# Patient Record
Sex: Female | Born: 1950
Health system: Southern US, Community
[De-identification: ages and names within clinical notes are randomized; demographics above are authoritative.]

## PROBLEM LIST (undated history)

## (undated) DIAGNOSIS — E559 Vitamin D deficiency, unspecified: Secondary | ICD-10-CM

## (undated) DIAGNOSIS — F329 Major depressive disorder, single episode, unspecified: Secondary | ICD-10-CM

## (undated) DIAGNOSIS — I10 Essential (primary) hypertension: Secondary | ICD-10-CM

## (undated) DIAGNOSIS — K5790 Diverticulosis of intestine, part unspecified, without perforation or abscess without bleeding: Secondary | ICD-10-CM

## (undated) DIAGNOSIS — Z9989 Dependence on other enabling machines and devices: Secondary | ICD-10-CM

## (undated) DIAGNOSIS — F32A Depression, unspecified: Secondary | ICD-10-CM

## (undated) DIAGNOSIS — E785 Hyperlipidemia, unspecified: Secondary | ICD-10-CM

## (undated) DIAGNOSIS — G5601 Carpal tunnel syndrome, right upper limb: Secondary | ICD-10-CM

## (undated) DIAGNOSIS — F419 Anxiety disorder, unspecified: Secondary | ICD-10-CM

## (undated) DIAGNOSIS — E119 Type 2 diabetes mellitus without complications: Secondary | ICD-10-CM

## (undated) DIAGNOSIS — G4733 Obstructive sleep apnea (adult) (pediatric): Secondary | ICD-10-CM

## (undated) DIAGNOSIS — I519 Heart disease, unspecified: Secondary | ICD-10-CM

## (undated) DIAGNOSIS — I2510 Atherosclerotic heart disease of native coronary artery without angina pectoris: Secondary | ICD-10-CM

## (undated) DIAGNOSIS — R32 Unspecified urinary incontinence: Secondary | ICD-10-CM

## (undated) DIAGNOSIS — K219 Gastro-esophageal reflux disease without esophagitis: Secondary | ICD-10-CM

## (undated) DIAGNOSIS — Z8542 Personal history of malignant neoplasm of other parts of uterus: Secondary | ICD-10-CM

## (undated) DIAGNOSIS — M1990 Unspecified osteoarthritis, unspecified site: Secondary | ICD-10-CM

## (undated) HISTORY — DX: Gastro-esophageal reflux disease without esophagitis: K21.9

## (undated) HISTORY — DX: Essential (primary) hypertension: I10

## (undated) HISTORY — PX: TOTAL ABDOMINAL HYSTERECTOMY W/ BILATERAL SALPINGOOPHORECTOMY: SHX83

## (undated) HISTORY — PX: KNEE ARTHROSCOPY: SUR90

## (undated) HISTORY — PX: LAPAROSCOPIC CHOLECYSTECTOMY: SUR755

## (undated) HISTORY — DX: Heart disease, unspecified: I51.9

## (undated) HISTORY — PX: CARDIAC CATHETERIZATION: SHX172

## (undated) HISTORY — DX: Anxiety disorder, unspecified: F41.9

## (undated) HISTORY — DX: Hyperlipidemia, unspecified: E78.5

## (undated) HISTORY — PX: TUBAL LIGATION: SHX77

## (undated) HISTORY — PX: CHOLECYSTECTOMY: SHX55

---

## 2002-10-28 ENCOUNTER — Observation Stay (HOSPITAL_COMMUNITY): Admission: RE | Admit: 2002-10-28 | Discharge: 2002-10-30 | Payer: Self-pay | Admitting: Gastroenterology

## 2002-10-28 ENCOUNTER — Encounter (INDEPENDENT_AMBULATORY_CARE_PROVIDER_SITE_OTHER): Payer: Self-pay | Admitting: Specialist

## 2002-12-30 ENCOUNTER — Ambulatory Visit (HOSPITAL_COMMUNITY): Admission: RE | Admit: 2002-12-30 | Discharge: 2002-12-30 | Payer: Self-pay | Admitting: Gastroenterology

## 2002-12-30 ENCOUNTER — Encounter (INDEPENDENT_AMBULATORY_CARE_PROVIDER_SITE_OTHER): Payer: Self-pay | Admitting: *Deleted

## 2003-02-25 DIAGNOSIS — I10 Essential (primary) hypertension: Secondary | ICD-10-CM

## 2003-02-25 DIAGNOSIS — E119 Type 2 diabetes mellitus without complications: Secondary | ICD-10-CM

## 2003-02-25 HISTORY — DX: Essential (primary) hypertension: I10

## 2003-02-25 HISTORY — DX: Type 2 diabetes mellitus without complications: E11.9

## 2003-05-26 DIAGNOSIS — Z8542 Personal history of malignant neoplasm of other parts of uterus: Secondary | ICD-10-CM

## 2003-05-26 HISTORY — DX: Personal history of malignant neoplasm of other parts of uterus: Z85.42

## 2003-06-20 ENCOUNTER — Encounter (INDEPENDENT_AMBULATORY_CARE_PROVIDER_SITE_OTHER): Payer: Self-pay | Admitting: Specialist

## 2003-06-20 ENCOUNTER — Inpatient Hospital Stay (HOSPITAL_COMMUNITY): Admission: RE | Admit: 2003-06-20 | Discharge: 2003-06-22 | Payer: Self-pay | Admitting: Obstetrics and Gynecology

## 2003-12-07 ENCOUNTER — Other Ambulatory Visit: Admission: RE | Admit: 2003-12-07 | Discharge: 2003-12-07 | Payer: Self-pay | Admitting: *Deleted

## 2004-04-08 ENCOUNTER — Other Ambulatory Visit: Admission: RE | Admit: 2004-04-08 | Discharge: 2004-04-08 | Payer: Self-pay | Admitting: *Deleted

## 2004-08-08 ENCOUNTER — Other Ambulatory Visit: Admission: RE | Admit: 2004-08-08 | Discharge: 2004-08-08 | Payer: Self-pay | Admitting: *Deleted

## 2005-01-31 ENCOUNTER — Other Ambulatory Visit: Admission: RE | Admit: 2005-01-31 | Discharge: 2005-01-31 | Payer: Self-pay | Admitting: Obstetrics and Gynecology

## 2005-02-06 ENCOUNTER — Encounter: Admission: RE | Admit: 2005-02-06 | Discharge: 2005-02-06 | Payer: Self-pay | Admitting: Gastroenterology

## 2005-02-12 ENCOUNTER — Ambulatory Visit (HOSPITAL_COMMUNITY): Admission: RE | Admit: 2005-02-12 | Discharge: 2005-02-12 | Payer: Self-pay | Admitting: Gastroenterology

## 2005-02-12 ENCOUNTER — Encounter (INDEPENDENT_AMBULATORY_CARE_PROVIDER_SITE_OTHER): Payer: Self-pay | Admitting: *Deleted

## 2005-08-11 ENCOUNTER — Other Ambulatory Visit: Admission: RE | Admit: 2005-08-11 | Discharge: 2005-08-11 | Payer: Self-pay | Admitting: Obstetrics & Gynecology

## 2006-02-10 ENCOUNTER — Other Ambulatory Visit: Admission: RE | Admit: 2006-02-10 | Discharge: 2006-02-10 | Payer: Self-pay | Admitting: Obstetrics and Gynecology

## 2006-08-17 ENCOUNTER — Other Ambulatory Visit: Admission: RE | Admit: 2006-08-17 | Discharge: 2006-08-17 | Payer: Self-pay | Admitting: Obstetrics and Gynecology

## 2006-08-21 ENCOUNTER — Ambulatory Visit (HOSPITAL_COMMUNITY): Admission: RE | Admit: 2006-08-21 | Discharge: 2006-08-21 | Payer: Self-pay | Admitting: Gastroenterology

## 2006-08-24 ENCOUNTER — Ambulatory Visit (HOSPITAL_COMMUNITY): Admission: RE | Admit: 2006-08-24 | Discharge: 2006-08-24 | Payer: Self-pay | Admitting: Gastroenterology

## 2006-11-19 ENCOUNTER — Encounter (INDEPENDENT_AMBULATORY_CARE_PROVIDER_SITE_OTHER): Payer: Self-pay | Admitting: General Surgery

## 2006-11-19 ENCOUNTER — Ambulatory Visit (HOSPITAL_COMMUNITY): Admission: RE | Admit: 2006-11-19 | Discharge: 2006-11-19 | Payer: Self-pay | Admitting: General Surgery

## 2007-02-12 ENCOUNTER — Other Ambulatory Visit: Admission: RE | Admit: 2007-02-12 | Discharge: 2007-02-12 | Payer: Self-pay | Admitting: Obstetrics & Gynecology

## 2007-08-19 ENCOUNTER — Other Ambulatory Visit: Admission: RE | Admit: 2007-08-19 | Discharge: 2007-08-19 | Payer: Self-pay | Admitting: Obstetrics and Gynecology

## 2008-02-22 ENCOUNTER — Other Ambulatory Visit: Admission: RE | Admit: 2008-02-22 | Discharge: 2008-02-22 | Payer: Self-pay | Admitting: Obstetrics and Gynecology

## 2008-05-17 ENCOUNTER — Ambulatory Visit (HOSPITAL_BASED_OUTPATIENT_CLINIC_OR_DEPARTMENT_OTHER): Admission: RE | Admit: 2008-05-17 | Discharge: 2008-05-17 | Payer: Self-pay | Admitting: Orthopedic Surgery

## 2008-07-04 ENCOUNTER — Emergency Department (HOSPITAL_COMMUNITY): Admission: EM | Admit: 2008-07-04 | Discharge: 2008-07-04 | Payer: Self-pay | Admitting: Family Medicine

## 2009-09-05 ENCOUNTER — Emergency Department (HOSPITAL_BASED_OUTPATIENT_CLINIC_OR_DEPARTMENT_OTHER): Admission: EM | Admit: 2009-09-05 | Discharge: 2009-09-06 | Payer: Self-pay | Admitting: Emergency Medicine

## 2009-09-05 ENCOUNTER — Ambulatory Visit: Payer: Self-pay | Admitting: Diagnostic Radiology

## 2010-05-12 LAB — DIFFERENTIAL
Basophils Absolute: 0.4 10*3/uL — ABNORMAL HIGH (ref 0.0–0.1)
Basophils Relative: 4 % — ABNORMAL HIGH (ref 0–1)
Eosinophils Absolute: 0.1 10*3/uL (ref 0.0–0.7)
Eosinophils Relative: 2 % (ref 0–5)
Lymphocytes Relative: 20 % (ref 12–46)
Lymphs Abs: 1.9 10*3/uL (ref 0.7–4.0)
Monocytes Absolute: 0.8 10*3/uL (ref 0.1–1.0)
Monocytes Relative: 8 % (ref 3–12)
Neutro Abs: 6.3 10*3/uL (ref 1.7–7.7)
Neutrophils Relative %: 66 % (ref 43–77)

## 2010-05-12 LAB — BASIC METABOLIC PANEL
BUN: 17 mg/dL (ref 6–23)
CO2: 27 mEq/L (ref 19–32)
Calcium: 9.1 mg/dL (ref 8.4–10.5)
Chloride: 105 mEq/L (ref 96–112)
Creatinine, Ser: 0.7 mg/dL (ref 0.4–1.2)
GFR calc Af Amer: >60 mL/min (ref >60)
GFR calc non Af Amer: >60 mL/min (ref >60)
Glucose, Bld: 122 mg/dL — ABNORMAL HIGH (ref 70–99)
Potassium: 4 mEq/L (ref 3.5–5.1)
Sodium: 142 mEq/L (ref 135–145)

## 2010-05-12 LAB — CBC
HCT: 38.6 % (ref 36.0–46.0)
Hemoglobin: 12.9 g/dL (ref 12.0–15.0)
MCH: 30.1 pg (ref 26.0–34.0)
MCHC: 33.4 g/dL (ref 30.0–36.0)
MCV: 89.9 fL (ref 78.0–100.0)
Platelets: 217 10*3/uL (ref 150–400)
RBC: 4.29 MIL/uL (ref 3.87–5.11)
RDW: 12.3 % (ref 11.5–15.5)
WBC: 9.5 10*3/uL (ref 4.0–10.5)

## 2010-06-04 LAB — POCT RAPID STREP A (OFFICE): Streptococcus, Group A Screen (Direct): NEGATIVE

## 2010-06-06 LAB — POCT HEMOGLOBIN-HEMACUE: Hemoglobin: 13 g/dL (ref 12.0–15.0)

## 2010-06-06 LAB — BASIC METABOLIC PANEL
BUN: 16 mg/dL (ref 6–23)
CO2: 30 mEq/L (ref 19–32)
Calcium: 9.6 mg/dL (ref 8.4–10.5)
Chloride: 100 mEq/L (ref 96–112)
Creatinine, Ser: 0.72 mg/dL (ref 0.4–1.2)
GFR calc Af Amer: >60 mL/min (ref >60)
GFR calc non Af Amer: >60 mL/min (ref >60)
Glucose, Bld: 158 mg/dL — ABNORMAL HIGH (ref 70–99)
Potassium: 4 mEq/L (ref 3.5–5.1)
Sodium: 141 mEq/L (ref 135–145)

## 2010-06-06 LAB — GLUCOSE, CAPILLARY
Glucose-Capillary: 114 mg/dL — ABNORMAL HIGH (ref 70–99)
Glucose-Capillary: 116 mg/dL — ABNORMAL HIGH (ref 70–99)

## 2010-07-09 NOTE — Op Note (Signed)
NAMEEMSLEE, [name] [name]              ACCOUNT NO.:  [id number]   MEDICAL RECORD NO.:  [id number]          PATIENT TYPE:  AMB   LOCATION:  DAY                          FACILITY:  [facility]   PHYSICIAN:  Adolph Pollack, M.D.DATE OF BIRTH:  05-08-1950   DATE OF PROCEDURE:  11/19/2006  DATE OF DISCHARGE:                               OPERATIVE REPORT   PREOPERATIVE DIAGNOSIS:  Biliary dyskinesia.   POSTOPERATIVE DIAGNOSIS:  Biliary dyskinesia.   PROCEDURE:  Laparoscopic cholecystectomy with intraoperative  cholangiogram.   SURGEON:  Adolph Pollack, M.D.   ASSISTANT:  Anselm Pancoast. Zachery Dakins, M.D.   ANESTHESIA:  General.   INDICATIONS:  [name] [name] is a 60 year old female who has type 2 diabetes  mellitus.  She has been having problems with upper abdominal pain in the  right upper quadrant radiating around to her subscapular area.  She has  known mild gastroparesis.  Abdominal ultrasound demonstrated no  gallstones, but she has biliary dyskinesia based on a nuclear medicine  scan.  After discussing the procedure risks and potential success rate  with her, she has elected to have a laparoscopic cholecystectomy and  thus presents for that.   TECHNIQUE:  She is seen in the holding area, then brought to the  operating room, placed supine on the operating table, and a general  anesthetic was administered.  The abdominal wall was sterilely prepped  and draped.  Marcaine solution was infiltrated in the subumbilical  region.  A subumbilical incision was made through the skin, subcutaneous  tissue, fat, fascia and peritoneum, entering the peritoneal cavity under  direct vision.  A pursestring suture of 0 Vicryl was placed around the  fascial edges.  A Hasson trocar was introduced into the peritoneal  cavity and pneumoperitoneum created by insufflation of CO2 gas.   Next a laparoscope was introduced.  The stomach was markedly distended  and was decompressed by way of an orogastric tube.  The  gallbladder was  small, fundus grasped and omental adhesions taken down bluntly.  The  fundus was retracted toward the right shoulder.  The infundibulum was  grasped and using careful blunt dissection, it was mobilized.  The  cystic duct was identified and using blunt dissection close to the  gallbladder, a window was created around it.  A clip was placed at the  cystic duct-gallbladder junction and a small incision made in the cystic  duct.  The cholangiocatheter was passed through the anterior abdominal  wall, then placed into the cystic duct and the cholangiogram was  performed.   Under real-time fluoroscopy, dilute contrast was injected to the cystic  duct, which was short in length.  The common hepatic, right and left  hepatic, and common bile ducts all filled and contrast drained promptly  into the duodenum without obvious evidence of obstruction.  The final  report is pending the radiologist's interpretation.   The cholangiocatheter was removed, the cystic duct was clipped three  times proximally and divided.  I then identified the cystic artery and  created a window around it.  It was clipped and divided.  The  gallbladder  was then dissected free from liver using electrocautery and  placed in an Endopouch bag.  The gallbladder fossa was irrigated and  bleeding points controlled with electrocautery until hemostasis was  adequate.  No bile leak was noted.  The irrigation fluid was evacuated  as much as possible.   The gallbladder was subsequently removed through the subumbilical port  in an Endopouch bag and the subumbilical fascial defect was closed by  tightening up and tying down the pursestring suture.  The  pneumoperitoneum was released and the remaining trocars were removed.  The skin incisions were closed with 4-0 Monocryl subcuticular stitches,  followed by Steri-Strips and sterile dressings.   She tolerated the procedure without any apparent complications and was   taken to the recovery room in satisfactory condition.      Adolph Pollack, M.D.  Electronically Signed     TJR/MEDQ  D:  11/19/2006  T:  11/19/2006  Job:  [id number]   cc:   Anselmo Rod, M.D.  Fax: [phone]   Marjory Lies, M.D.  Fax: [phone]

## 2010-07-09 NOTE — Op Note (Signed)
[name] [name], [name] [name]              ACCOUNT NO.:  [id number]   MEDICAL RECORD NO.:  [id number]          PATIENT TYPE:  AMB   LOCATION:  DSC                          FACILITY:  [facility]   PHYSICIAN:  Harvie Junior, M.D.   DATE OF BIRTH:  14-Apr-1950   DATE OF PROCEDURE:  05/17/2008  DATE OF DISCHARGE:                               OPERATIVE REPORT   PREOPERATIVE DIAGNOSIS:  Medial meniscal tear with suspected  osteochondritis dissecans, medial femoral condyle.   POSTOPERATIVE DIAGNOSES:  1. Medial meniscus tear.  2. Chondromalacia, medial femoral condyle.  3. Chondromalacia of the patellofemoral.   PROCEDURE:  1. Partial posterior medial meniscectomy with corresponding      debridement of the medial compartment.  2. Debridement of chondromalacia patella.   SURGEON:  Harvie Junior, MD   ASSISTANT:  Marshia Ly, P.A.   ANESTHESIA:  General.   BRIEF HISTORY:  [name] is a 60 year old female with a long history of  having had significant left knee pain.  She had been treated  conservatively for prolong period of time.  Because of continued  complaints of pain, she was evaluated in the office and felt the need  for operative knee arthroscopy of the left knee.  She was brought to the  operating room for this procedure.  The patient had long standing medial  knee pain with catching and locking.  She had been scheduled for  previous arthroscopy about a year ago but had held off for some  financial issues, and she was now brought to the operating room for  operative knee arthroscopy.   PROCEDURE:  The patient was brought to the operating room.  After  adequate anesthesia was obtained under general anesthetic, the patient  was placed supine on the operating table.  The left leg was prepped and  draped in usual sterile fashion.  Following this, routine arthroscopic  examination of the knee revealed there was an obvious posterior horn  medial meniscus tear, this was a flap that  folded back medially.  We  were finally able to get the flap on flap, and we resected this, kind of  left like a large calcified meniscal loose body.  This was removed with  a grasper.  Attention was then turned to the medial meniscus, which had  a posterior horn tear, sort of radial and a complex portion.  This was  debrided, __________ to the medial femoral condyle was a very softened  area with some cracks and fissures, I put a shaver on this and cleaned  it up, but I really did not go all the way down the bone.  At this  point, attention was turned to the ACL, normal.  Attention was turned to  the lateral side, normal.  Attention was turned back of patellofemoral  joint with some chondromalacia, patella was just debrided back to a  smooth and stable rim, and essentially down to bleeding bone and  patellofemoral trochlear area.  At this point, the knee was  copiously and thoroughly irrigated, and suctioned dried, and all sterile  portals were closed with a bandage.  A sterile compressive dressing was  applied, and the patient was taken to the recovery room and was noted to  be in satisfactory condition.  Estimated blood loss for this procedure  was none.      Harvie Junior, M.D.  Electronically Signed     JLG/MEDQ  D:  05/17/2008  T:  05/18/2008  Job:  [id number]

## 2010-07-12 NOTE — Op Note (Signed)
NAME:  [name] [name], [name] [name]                        ACCOUNT NO.:  [id number]   MEDICAL RECORD NO.:  [id number]                   PATIENT TYPE:  INP   LOCATION:  0006                                 FACILITY:  [facility]   PHYSICIAN:  Laqueta Linden, M.D.                 DATE OF BIRTH:  May 25, 1950   DATE OF PROCEDURE:  06/20/2003  DATE OF DISCHARGE:                                 OPERATIVE REPORT   PREOPERATIVE DIAGNOSES:  Atypical complex endometrial hyperplasia, rule out  adenocarcinoma.   POSTOPERATIVE DIAGNOSES:  Atypical complex endometrial hyperplasia, rule out  adenocarcinoma, florid atypical complex hyperplasia with focal well  differentiated adenocarcinoma with maximum depth of invasion of less than or  equal to 1 mm per frozen section, Dr. Luisa Hart.   PROCEDURE:  Total abdominal hysterectomy, bilateral salpingo-oophorectomy.   SURGEON:  Laqueta Linden, M.D.   ASSISTANT:  Andres Ege, M.D.   STANDBY WITH INTRAOPERATIVE CONSULTATION:  De Blanch, M.D.   ANESTHESIA:  General endotracheal.   ESTIMATED BLOOD LOSS:  450 mL   URINE OUTPUT:  200 mL   FLUIDS:  3500 mL crystalloid.   COUNTS:  Correct x2.   COMPLICATIONS:  None.   INDICATIONS FOR PROCEDURE:  [name] [name] was a 60 year old para 3,  perimenopausal female with a several year history of abnormal bleeding and  left lower quadrant pain referred for an ultrasound revealing two left  ovarian cysts that were felt to be most likely functional in nature.  However, she did give a history of several years of erratic menstrual cycles  and had a 3 cm endometrial stripe on that same ultrasound.  Office  endometrial biopsy reveals complex atypical endometrial hyperplasia, cannot  rule out adenocarcinoma.  After consultation with Dr. Ronita Hipps, [facility] of GYN/Oncology, it was felt appropriate to proceed with TAH/BSO  at this facility with GYN/Oncology standby in the event that circumstances  dictated  that staging procedure with lymph node sampling was indicated.  The  patient was extensively counseled as to the risks, benefits, alternatives,  and complications of the procedure including but not limited to anesthesia  risks, infection, bleeding possibly requiring transfusion, injury to bowel,  bladder, ureters, vessels, nerves, fistula formation, risk of DVT, PE,  pneumonia and even death as well as an increase in all these risks due to  her obesity and hypertension as well as increase in these risks if she  needed to undergo staging.  Full consent was given, all questions were  answered but the discussion of menopausal vasomotor symptoms and the  possible use of estrogen was deferred until final pathology has returned and  until the patient's symptoms are assessed postoperatively. She was also  informed regarding postoperative expectation regarding hospital stay,  postoperative return to work and normal activity as well as sexual  functioning and menopausal functioning.  She voiced her understanding and  acceptance of all risks and  agrees to proceed.  She has undergone a  mechanical bowel prep preoperatively and she received Ancef 1 g IV  antibiotic prophylaxis.   DESCRIPTION OF PROCEDURE:  The patient presented for same day surgery, she  was taken to the operating room and after proper identification and consents  were ascertained she was placed on the operating table in the supine  position.  After the induction of general endotracheal anesthesia, she was  placed in the dorsal lithotomy position and the abdomen, perineum and vagina  were prepped and draped in a routine sterile fashion.  A transurethral Foley  was placed.  PAS stockings were in place and were operational throughout the  procedure.  As per prior to the discussion with the patient, a low  transverse incision was made with her knowledge that this might be extended  into a vertical incision if staging was indicated. The  incision was carried  down to the fascia which was incised in a routine fashion. The  parietoperitoneum was incised and the incision extended superiorly and  inferiorly to the level of the bladder.  Palpation of the upper abdomen  revealed a smooth spleen tip, liver edge, and diaphragm, all peritoneal  surfaces were free of lesions. The appendix was retrocecal and quite long,  perhaps 6-7 cm but had no palpable appendicolith or visible abnormalities.  The pelvis was notable for somewhat atrophic appearing ovaries with a small  paraovarian cysts noted on the left and a small functional appearing cyst on  the left ovary as well. The uterus was somewhat enlarged and globular in  appearance. There were no external excrescences or lesions and it was freely  mobile. The Bookwalter retractor was placed and retractors placed with  traction to the bowel into the upper abdomen.  The uterus was then grasped  with Kelly clamps and elevated as best as possible in the operative field.  Of note, due to the patient's obesity, this visualization was quite  difficult and the procedure was significantly impacted due to the need to  use long instruments and to work at such a distance deep into the pelvis.  The round ligaments were clamped, suture ligated and cut with the Bovie with  dissection carried forward in the anterior leaf and posteriorly in the leafs  of the broad ligament. The pelvic sidewalls were then opened and the ureters  were visualized deep in the pelvis and well out of the operative field.  The  infundibulopelvic ligaments were isolated bilaterally, doubly clamped, cut  and doubly ligated with a free tie and stitch of #0 Vicryl.  The uterine  vessels were then skeletonized bilaterally and parametrial clamps placed  across with pedicles cut and suture ligated.  Successive pedicles were taken of the cardinal ligaments bilaterally after careful dissection of the  bladder off of the lower uterine  segment and cervix.  Again, this was  challenged by the patient's morbid obesity and the depth of the pelvis.  Eventually the upper vaginal angles were clamped and cut with visualization  of the cervix and upper vagina. The scissors were then used ot circumscribe  the cervix with removal of the entire specimen which was sent to pathology  for frozen section.  Of note, cytologic washings were taken immediately upon  entry into the abdomen. These were placed in heparinized solution and sent  for cytologic examination.  The Richardson angled sutures were placed  bilaterally at both vaginal angles with good hemostasis noted.  The  remainder of  the upper vagina was closed with interrupted figure-of-eight  sutures of #0 Vicryl closing from front to back. Several small bleeding  points were cauterized. Hemostasis was noted to be excellent. The ureters  were of normal course and caliber.  Of note, the patient's left ureter was  noted to be somewhat narrower than typical and was perhaps 3-4 mm in  diameter as opposed to the right ureter which was more like 7-8 mm. Both had  normal appearing peristaltic movement at the conclusion of the procedure.  All pedicles were hemostatic, copious lavage was accomplished.  At this  point, frozen section was received by Dr. Luisa Hart and revealed the above  findings.  Intraoperative consultation was then held with Dr. Stanford Breed  who felt that the cytologic washings needed to be sent and the pelvis  explored as had been done but that no lymph node dissection was indicated.  Of note, the lymph nodes had been palpated along the periaortic chain and in  the pelvis and there were no palpable or visible enlarged lymph nodes. The  retractor and all lap packs were removed, the parietoperitoneum was closed  in a running fashion using 2-0 Vicryl suture.  The rectus muscles were  loosely reapproximated in the midline. Subfascial hemostasis was  ascertained, the fascia  was closed from both lateral aspects to the midline  using a running stitch of #0 Maxon.  Subcutaneous hemostasis was  ascertained.  The skin was closed with staples, Steri-Strips and a pressure  dressing were applied. The patient was awakened and stable on  transfer to the PACU.  Estimated blood loss 450 mL.  Urine output 200 mL.  Fluids 3500 mL of crystalloid.  Counts correct x2.  Complications none. Due  to the above mentioned features, it was felt that this procedure took  greater than 20% longer than a typical hysterectomy with considerable more  difficulty as well.                                               Laqueta Linden, M.D.    LKS/MEDQ  D:  06/20/2003  T:  06/20/2003  Job:  [id number]   cc:   Marjory Lies, M.D.  [address]  [place]  [place] [address]  Fax: [phone]   Telford Nab, R.N.  [address]  [place], [place] [address]

## 2010-07-12 NOTE — Consult Note (Signed)
NAME:  [name], [name]                        ACCOUNT NO.:  [id number]   MEDICAL RECORD NO.:  [id number]                   PATIENT TYPE:  OBV   LOCATION:  4742                                 FACILITY:  [facility]   PHYSICIAN:  Sharlet Salina T. Hoxworth, M.D.          DATE OF BIRTH:  Mar 08, 1950   DATE OF CONSULTATION:  10/28/2002  DATE OF DISCHARGE:                                   CONSULTATION   REFERRING PHYSICIAN:  Anselmo Rod, M.D.   CHIEF COMPLAINT:  Bleeding post-polypectomy.   HISTORY OF PRESENT ILLNESS:  I was asked by Dr. Anselmo Rod to evaluate  [name] [name].  She is a 60 year old white female who underwent colonoscopy  earlier today due to occasional bright red blood in her stools and also a  family history of colon polyps.  Colonoscopy today revealed a large,  pedunculated polyp at about 18 cm. This was removed with snare cautery. At  that time, there was noted to be initially some brisk bleeding from the base  at the stalk, described by Dr. Loreta Ave. This was controlled with 10 cc  epinephrine injection.  Due to the significant bleeding noted, the patient  was admitted for further observation.   Since admission, which now is about 6 hours ago, she has felt well. Vital  signs remain stable, she has no abdominal pain, distention or urgency to  have a bowel movement. She has not had any bowel movement or blood per  rectum.   PAST MEDICAL HISTORY:  She is followed for hypertension and elevated  cholesterol. She has had no abdominal surgery.   MEDICATIONS:  Hydrochlorothiazide, Maxipime, gemfibrozil.   ALLERGIES:  CODEINE.   SOCIAL HISTORY:  She is married.  Does not smoke cigarettes or drink  alcohol.   REVIEW OF SYSTEMS:  ABDOMEN:  Denies abdominal pain, nausea and vomiting.  GENERAL:  Denies weakness, lightheadedness.   PHYSICAL EXAMINATION:  VITAL SIGNS:  She is afebrile, pulse 72, blood  pressure 178/70, respirations 18.  GENERAL:  She is a mildly obese white  female, comfortable in no distress.  SKIN:  Warm and dry.  LUNGS:  Clear to auscultation.  ABDOMEN:  No scars, nondistended, soft, nontender without palpable masses.   LABORATORY DATA:  Follow up hemoglobin 6:30 p.m. is 15, white count 8000.    ASSESSMENT/PLAN:  Bleeding, immediately post-polypectomy from large polyp in  the sigmoid colon. This appeared to be controlled with epinephrine injection  and at this point, there is no evidence of continued bleeding.   I will of course remain available as needed for any difficulties, and will  follow up again tomorrow.                                               Lorne Skeens. Hoxworth, M.D.  BTH/MEDQ  D:  10/28/2002  T:  10/29/2002  Job:  [id number]   cc:   Anselmo Rod, M.D.  [address]  [address]  [place]  [place] [address]  Fax: [phone]

## 2010-07-12 NOTE — H&P (Signed)
NAME:  [name] [name], BASSO                        ACCOUNT NO.:  [id number]   MEDICAL RECORD NO.:  [id number]                   PATIENT TYPE:  OBV   LOCATION:  4742                                 FACILITY:  [facility]   PHYSICIAN:  Anselmo Rod, M.D.               DATE OF BIRTH:  May 11, 1950   DATE OF ADMISSION:  10/28/2002  DATE OF DISCHARGE:                                HISTORY & PHYSICAL   ADMISSION HISTORY:  Anemia, gastrointestinal bleed.   HISTORY OF PRESENT ILLNESS:  This is a 60 year old white female with history  of hypertension, dyslipidemia, and pneumonia who presented with history of  occasional bright red blood per rectum, especially two days prior to her  menstrual cycle as well as mild abdominal pain. The patient has family  history of colon polyps and was seen in the clinic and scheduled for colon  cancer screening. The patient had a colonoscopy done today which shows large  polyps. Polypectomy was performed, revealing a very large base, a stump,  which was oozing a lot of blood. The patient is being admitted for 23-hour  observation to ensure that no frank hemorrhage occurred from the stalk.  Epinephrine injection was performed in the area.   PAST MEDICAL HISTORY:  1. Hypertension.  2. Pneumonia.  3. Dyslipidemia.  4. Status post BLT.  5. Three normal vaginal deliveries.   ALLERGIES:  CODEINE.   MEDICATIONS:  1. Hydrochlorothiazide 25 mg daily.  2. __________ 7.5 mg daily.  3. Gemfibrozil 600 mg two daily.   SOCIAL HISTORY:  The patient is married with three children. She is a  housewife. No tobacco. No alcohol. The patient walks over two miles three  times a week. She has no IV drug use.   FAMILY HISTORY:  Father died at age of 101 of lung cancer, hypertension,  colon cancer, and polyps. Mother died at 81 of hypertension, sepsis. Three  brothers, two are healthy, one brother with diabetes mellitus, pancreatitis,  hypertension, and fungemia.   REVIEW OF  SYSTEMS:  Positive for menorrhagia, positive for right hand joint  pains, positive for some occasional skin rashes. Negative for constipation,  nausea, vomiting, or diarrhea.   PHYSICAL EXAMINATION:  VITAL SIGNS:  On exam she had a temperature of 98.1,  blood pressure 113/64, pulse 68, respiratory rate 18. Saturations 97% on 2  liters. She is generally drowsy post colonoscopy.  HEENT:  PERRL. EOMI. No jaundice. No pallor. NCAT.  NECK:  Supple.  CHEST:  Clear to auscultation bilaterally with good air entry.  CARDIOVASCULAR:  Showed regular rate and rhythm. No audible murmurs.  ABDOMEN:  Obese, nontender, with positive bowel sounds.  EXTREMITIES:  Showed no edema, cyanosis, or clubbing.   LABORATORY DATA:  Pending currently. Previous labs in August 2004 showed a  glucose of 102, creatinine 0.6. Normal hepatic panel. The patient has had  cholesterol of 213, triglycerides 107, with LDL of  134 and HDL of 58.  Hemoglobin at that time was 10.9 with a white count of 7.3 and platelets of  185.   ASSESSMENT/PLAN:  1. This is a 60 year old white female status post colonoscopy for colon     cancer screening secondary to bright red blood per rectum and polypectomy     with a large bleeding polypectomy stump. The patient is to be admitted to     telemetry for observation secondary to large stump. Will follow serial     CBCs and two wide bore IVs will be set up with normal saline at 150 mL an     hour after initial bolus. The patient is to be monitored closely with     vitals, being monitored for signs of acute bleeding including drop in     systolic blood pressure or increase in tachycardia. The patient will     require urgent colonoscopy and possible surgical intervention if she     breaks out and bleeds profusely.  2. Hypertension. The patient is currently on multiple hypertensives. Blood     pressure looks okay. Will hold all antihypertensives and keep patient     NPO. If blood pressure climbs  later tonight, we may use labetalol p.r.n.     to control blood pressure.  3. Dyslipidemia. The patient is Lopid which we will also hold at this point.     We will resume that in the morning if everything is stable with patient.      Lonia Blood, M.D.                         Anselmo Rod, M.D.    Verlin Grills  D:  10/28/2002  T:  10/29/2002  Job:  [id number]   cc:   Marjory Lies, M.D.  [address]  [place]  [place] [address]  Fax: [phone]

## 2010-07-12 NOTE — Op Note (Signed)
NAME:  [name] [name], [name] [name]                        ACCOUNT NO.:  [id number]   MEDICAL RECORD NO.:  [id number]                   PATIENT TYPE:  OBV   LOCATION:  4799                                 FACILITY:  [facility]   PHYSICIAN:  Anselmo Rod, M.D.               DATE OF BIRTH:  04-05-1950   DATE OF PROCEDURE:  10/28/2002  DATE OF DISCHARGE:                                 OPERATIVE REPORT   PROCEDURE:  Colonoscopy, changed to a flexible sigmoidoscopy with snare  polypectomy x1.   ENDOSCOPIST:  Anselmo Rod, M.D.   INSTRUMENT USED:  Olympus video colonoscope.   INDICATIONS FOR PROCEDURE:  Rectal bleeding in a 60 year old white female  rule out colonic polyps, masses, etc.   PREPROCEDURE PREPARATION:  Informed consent was obtained from the patient  and the patient was  fasted for 8 hours prior to  the procedure and prepped  with a bottle of magnesium citrate and a gallon of GoLYTELY the  night prior  to the procedure.   PREPROCEDURE PHYSICAL:  The patient had stable vital signs. Neck supple.  Chest clear to auscultation, S1, S2 regular. Abdomen soft with normoactive  bowel sounds.   DESCRIPTION OF PROCEDURE:  The patient was placed in the left lateral  decubitus position and sedated with 70 mg of Demerol and 7 mg of Versed  intravenously. Once sedation was adequate, the patient was maintained on low  flow oxygen and continuous cardiac monitoring.   The Olympus video colonoscope was advanced into the rectum to about 18 cm. A  small sessile polyp was seen in the rectum; this was not removed. A large  pedunculated polyp  was removed from 18 cm after injecting the base of the  polyp with 6 to 7 mL of epinephrine. A polypectomy was performed carefully  with cut and coag method. There was significant bleeding from the  polypectomy site and therefore another 10 mL of epinephrine was injected  around the polypectomy site. Hemostasis was achieved. The procedure was  aborted at this  point with plans to admit the patient for observation. The  rectal polyp  was not removed as mentioned above.   IMPRESSION:  1. Large pedunculated polyp snared from 18 cm.  2. Small polyp  from rectum, not removed.  3. Procedure aborted  at 18 cm because of significant post polypectomy     bleeding.   RECOMMENDATIONS:  1. Admit to telemetry unit for close observation.  2. Serial CBCs.  3.     Type and cross, keep 2 units in the blood bank for transfusion if needed.  4. A 24 observation and discharge  thereafter if the patient remains stable.  5. Surgical backup (Dr. Glenna Fellows informed).  Anselmo Rod, M.D.    JNM/MEDQ  D:  10/28/2002  T:  10/29/2002  Job:  [id number]   cc:   Marjory Lies, M.D.  [address]  [place]  [place] [address]  Fax: [phone]   Lorne Skeens. Hoxworth, M.D.  [address], [address]  [place]  [place] [address]  Fax: [phone]

## 2010-07-12 NOTE — Op Note (Signed)
[name] [name], [name] [name]              ACCOUNT NO.:  [id number]   MEDICAL RECORD NO.:  [id number]          PATIENT TYPE:  AMB   LOCATION:  ENDO                         FACILITY:  [facility]   PHYSICIAN:  Anselmo Rod, M.D.  DATE OF BIRTH:  [birth year]   DATE OF PROCEDURE:  02/12/2005  DATE OF DISCHARGE:                                 OPERATIVE REPORT   PROCEDURE:  Colonoscopy with cold biopsies x3.   ENDOSCOPIST:  Anselmo Rod, M.D.   INSTRUMENT:  Olympus videocolonoscope.   INDICATIONS FOR PROCEDURE:  A 60 year old white female with a history of  tubular adenoma removed in the past undergoing a repeat colonoscopy for  surveillance.  The patient has had some left lower quadrant pain and has a  history of diverticulosis.   PREPROCEDURE PREPARATION:  Informed consent was procured from the patient.  The patient fasted for 4 hours prior to the procedure and prepped with  OsmoPrep pills the night of and the morning of the procedure. The risks and  benefits of the procedure including a 10% missed rate of cancer and polyps  were discussed with the patient as well.   PREPROCEDURE PHYSICAL:  VITAL SIGNS: The patient with stable vital signs.  NECK:  Supple.  CHEST:  Clear to auscultation.  HEART:  S1, S2 regular.  ABDOMEN:  Soft with normal bowel sounds.  Some left lower quadrant  tenderness on palpation.  No guarding, no rebound, no hepatosplenomegaly.   DESCRIPTION OF PROCEDURE:  The patient was placed in the left lateral  decubitus position and sedated with 100 mcg of Fentanyl and 7.5 mg of Versed  in slow incremental doses. Once the patient was adequately sedated,  maintained on low-flow oxygen, and continuous cardiac monitoring; the  Olympus videocolonoscope was advanced from the rectum to the cecum.  The  appendicular orifice and ileocecal valve were clearly visualized and  photographed.  The terminal ileum appeared normal without lesions.  A few  early diverticula were seen in the  sigmoid colon.  Three small sessile  polyps were biopsied from 7.0 cm (cold biopsies x3).  Retroflexion of the  rectum revealed small internal hemorrhoids.  The rest of the exam was  unremarkable.  The patient tolerated the procedure without complications.   IMPRESSION:  1.Small nonbleeding internal hemorrhoids.  2.Early sigmoid diverticula.  3.Three small sessile polyps removed by a cold biopsy from 70 cm.  4.Normal appearing transverse colon, right colon, cecum an terminal ileum.   RECOMMENDATIONS:  1.Await pathology results.  2.Avoid nonsteroidals including aspirin for the next 4 weeks.  3.Repeat colonoscopy depending on pathology results.  4.A high fiber diet with liberal fluid intake.  5.Outpatient follow up in the next 4 weeks or earlier if need be.      Anselmo Rod, M.D.  Electronically Signed     JNM/MEDQ  D:  02/12/2005  T:  02/13/2005  Job:  [id number]   cc:   Marjory Lies, M.D.  Fax: [phone]

## 2010-07-12 NOTE — Op Note (Signed)
NAME:  [name] [name], [name] [name]                        ACCOUNT NO.:  [id number]   MEDICAL RECORD NO.:  [id number]                   PATIENT TYPE:  AMB   LOCATION:  ENDO                                 FACILITY:  [facility]   PHYSICIAN:  Anselmo Rod, M.D.               DATE OF BIRTH:  11-May-1950   DATE OF PROCEDURE:  12/30/2002  DATE OF DISCHARGE:                                 OPERATIVE REPORT   PROCEDURE:  Colonoscopy with snare polypectomy times one.   ENDOSCOPIST:  Anselmo Rod, M.D.   INSTRUMENT USED:  Olympus video colonoscope.   INDICATIONS FOR PROCEDURE:  [age]-old white female undergoing  colonoscopy after a large tubulovillous adenoma was removed from the  rectosigmoid colon in September of 2004.  The patient had significant  bleeding from the polypectomy site and the procedure was aborted at that  point.  We plan is to re-prep and redo the procedure at this time.   PRE-PROCEDURE PHYSICAL:  VITAL SIGNS:  Stable.  NECK:  Supple.  CHEST:  Clear to auscultation.  HEART:  S1 and S2 are normal.  ABDOMEN:  Soft with normal bowel sounds. No masses are palpable.   DESCRIPTION OF PROCEDURE:  The patient was placed in the left lateral  decubitus position and sedated with 70 mg of Demerol and 8 mg of Versed  intravenously.  Once the patient was adequately sedated and maintained on  low flow oxygen and continuous cardiac monitoring the Olympus video  colonoscope was advanced in to the rectum to the cecum.  The appendiceal  orifice and ileocecal valve were clearly visualized and photographed.  The  terminal ileum appeared normal.  A small rectal polyp was snared.  Small  internal hemorrhoids were seen on retroflexion in the rectum.  There were a  few left sided diverticula noted.  The transverse colon, right colon  and  terminal ileum appeared normal and so did the cecum.  The patient tolerated  the procedure well without complications.   IMPRESSION:  1. Small non-bleeding  internal hemorrhoids.  2. Left sided diverticulosis.  3. Small polyp snared from the rectum.  4. Normal appearing proximal and left colon, transverse colon, right colon,     cecum and terminal ileum.   RECOMMENDATIONS:  1. Await pathology results.  2.     Avoid non-steroidals including aspirin for the next three weeks.  3. Repeat colonoscopy screening in the next three years.  4. Outpatient follow-up as the need arises.                                               Anselmo Rod, M.D.    JNM/MEDQ  D:  12/30/2002  T:  12/31/2002  Job:  [id number]   cc:   Marjory Lies, M.D.  [address]  [place]   [address]  Fax: [phone]

## 2010-07-12 NOTE — Discharge Summary (Signed)
NAME:  [name] [name], [name] [name]                        ACCOUNT NO.:  [id number]   MEDICAL RECORD NO.:  [id number]                   PATIENT TYPE:  OBV   LOCATION:  4742                                 FACILITY:  [facility]   PHYSICIAN:  Anselmo Rod, M.D.               DATE OF BIRTH:  01/15/1951   DATE OF ADMISSION:  10/28/2002  DATE OF DISCHARGE:  10/30/2002                                 DISCHARGE SUMMARY   DISCHARGE DIAGNOSES:  1. Large pedunculated polyp at 18 cm status post polypectomy.  2. Post polypectomy bleed.  3. Hypertension.  4. High cholesterol.  5. Anemia secondary to #1.   DISCHARGE MEDICATIONS:  1. Hydrochlorothiazide 25 mg daily.  2. Maxafil 7.5 mg daily.  3. Gemfibrozil 600 mg daily.  4. Iron p.o. one daily.   DISPOSITION ON FOLLOWUP:  The patient is to follow up with Dr. Loreta Ave in the  office.  She was given an office number [phone] to call for appointment.   PROCEDURES PERFORMED:  Colonoscopy performed on October 28, 2002.  See  operative report.  The patient had a large pedunculated polyp at 16 cm that  was removed by snare polypectomy.  She had post polypectomy bleed.  The area  was injected with epinephrine during procedure.   CONSULTATIONS:  Dr. Glenna Fellows, general surgery.  Consulted for  postoperative bleed.  Mainly, for observation and possible surgical  intervention.   BRIEF HISTORY AND PHYSICAL:  This is a 60 year old white female with history  of hypertension and dyslipidemia and pneumonia who presented with occasional  bright red blood per rectum especially two days before her cycle and also  mild abdominal pain.  The patient has family history of colon polyps and was  sent for colon cancer screening in the office.  She was found to have a  large polyp at colonoscopy and polypectomy was performed leaving a large  basal stalk which was ________ post polypectomy. The patient was therefore  admitted for 23-hour observation to avoid frank  hemorrhage and possible  reintervention.  Her vitals on admission remain a temperature 98.1, blood  pressure 138/64, pulse 68, respiratory rate of 18.  Saturation of 97% on  room air.  Her hemoglobin was 11 with white count of 8.2,  platelets of 312.  LFTs were normal.  PTT, INR was 1.1.   HOSPITAL COURSE:  1. Post polypectomy bleed.  The patient was admitted to stepdown and was     observed throughout her stay.  Her hemoglobin was stable.  She was cross     matched for blood in case she needed transfusion.  She was never     transfused.  Her hemoglobin went from 11 on the day of admission and up     to 12, by the day of discharge was down to 10.3.  No later than that.     She was therefore discharged on iron  tablets after establishing that her     vitals are stable.  The patient does not seem to be bleeding anymore from     the site.  Surgery was consulted, but the patient did not require any     physical intervention.  2. Hypertension.  The patient's blood pressure remained stable throughout     hospitalization.  She was maintained on her home medications at the time     of discharge.  While in the hospital she was took off antihypertensive     because of potential for gastrointestinal bleed and possibility of target     bleeding post polypectomy.  The patient's polyps were saved for histology     which came back as tubulovillous adenoma.  There was no high grade     dysplasia or invasive malignancy identified.      Lonia Blood, M.D.                         Anselmo Rod, M.D.    LG/MEDQ  D:  11/07/2002  T:  11/07/2002  Job:  [id number]   cc:   Dr. Doretha Imus in [place] T. Hoxworth, M.D.  [address]  [place]  [place] [address]  Fax: [phone]

## 2010-12-05 LAB — COMPREHENSIVE METABOLIC PANEL
ALT: 30
AST: 22
Albumin: 4.1
Alkaline Phosphatase: 67
BUN: 18
CO2: 30
Calcium: 9.8
Chloride: 103
Creatinine, Ser: 0.7
GFR calc Af Amer: >60
GFR calc non Af Amer: >60
Glucose, Bld: 120 — ABNORMAL HIGH
Potassium: 3.5
Sodium: 141
Total Bilirubin: 0.6
Total Protein: 7.7

## 2010-12-05 LAB — CBC
HCT: 37.6
Hemoglobin: 12.9
MCHC: 34.3
MCV: 89.8
Platelets: 253
RBC: 4.19
RDW: 12.2
WBC: 6

## 2010-12-05 LAB — DIFFERENTIAL
Basophils Absolute: 0
Basophils Relative: 1
Eosinophils Absolute: 0.1
Eosinophils Relative: 2
Lymphocytes Relative: 28
Lymphs Abs: 1.7
Monocytes Absolute: 0.7
Monocytes Relative: 11
Neutro Abs: 3.5
Neutrophils Relative %: 58

## 2010-12-05 LAB — URINALYSIS, ROUTINE W REFLEX MICROSCOPIC
Bilirubin Urine: NEGATIVE
Glucose, UA: NEGATIVE
Hgb urine dipstick: NEGATIVE
Ketones, ur: NEGATIVE
Specific Gravity, Urine: 1.009
pH: 6

## 2010-12-05 LAB — URINE MICROSCOPIC-ADD ON

## 2010-12-05 LAB — PROTIME-INR
INR: 0.9
Prothrombin Time: 12.1

## 2012-02-16 ENCOUNTER — Ambulatory Visit (INDEPENDENT_AMBULATORY_CARE_PROVIDER_SITE_OTHER): Payer: Medicaid Other | Admitting: Pulmonary Disease

## 2012-02-16 ENCOUNTER — Encounter: Payer: Self-pay | Admitting: Pulmonary Disease

## 2012-02-16 VITALS — BP 138/82 | HR 67 | Temp 97.9°F | Ht 64.0 in | Wt 261.6 lb

## 2012-02-16 DIAGNOSIS — G4733 Obstructive sleep apnea (adult) (pediatric): Secondary | ICD-10-CM | POA: Insufficient documentation

## 2012-02-16 DIAGNOSIS — G4710 Hypersomnia, unspecified: Secondary | ICD-10-CM

## 2012-02-16 NOTE — Patient Instructions (Addendum)
Will give you the flu vaccine today Will arrange a sleep study, and will see you back once the results are available.

## 2012-02-16 NOTE — Assessment & Plan Note (Signed)
The patient's history is very suggestive of obstructive sleep apnea, although she also has some history that may be consistent with periodic limb movements.  I have had a long discussion with her about sleep apnea, including its impact to her quality of life and cardiovascular health.  I think she needs to have a formal sleep study done for diagnosis, and the patient is agreeable.

## 2012-02-16 NOTE — Progress Notes (Signed)
  Subjective:    Patient ID: [name] [name], female    DOB: 06-Oct-1950, 61 y.o.   MRN: [id number]  HPI The patient is a 61 year old female who I've been asked to see for hypersomnia.  This has been very significant during the day, and ongoing for the last 2-3 years.  The patient has been noted to have loud snoring, and her daughter has seen her have an abnormal breathing pattern during sleep while sitting in a recliner.  The patient typically feels okay when she first arises, but quickly will become very sleepy if she sits down to do any form of inactivity.  She also notes sleepiness in the evenings while trying to watch TV or movies, but some nights are worse than others.  She also has sleepiness with driving at times.  She is unsure if there are leg kicks during the night, but occasionally has symptoms that may be consistent with the restless leg syndrome.  The patient states that her weight is up 30 pounds over the last 2 years, and her Epworth today is abnormal at 18.    Sleep Questionnaire: What time do you typically go to bed?( Between what hours) 1030-11pm How long does it take you to fall asleep? 30 min How many times during the night do you wake up? 1 What time do you get out of bed to start your day? 0530 Do you drive or operate heavy machinery in your occupation? No How much has your weight changed (up or down) over the past two years? (In pounds) 30 lb (13.608 kg) Have you ever had a sleep study before? No Do you currently use CPAP? No Do you wear oxygen at any time? No    Review of Systems  Constitutional: Negative for fever and unexpected weight change.  HENT: Positive for congestion ( recent cold symptoms), rhinorrhea, sneezing and postnasal drip. Negative for ear pain, nosebleeds, sore throat, trouble swallowing, dental problem and sinus pressure.   Eyes: Negative for redness and itching.  Respiratory: Positive for cough, chest tightness, shortness of breath and wheezing ( with  exertion).   Cardiovascular: Positive for palpitations and leg swelling.  Gastrointestinal: Negative for nausea and vomiting.  Genitourinary: Negative for dysuria.  Musculoskeletal: Positive for joint swelling ( knee).  Skin: Negative for rash.  Neurological: Positive for headaches.  Hematological: Does not bruise/bleed easily.  Psychiatric/Behavioral: Negative for dysphoric mood. The patient is not nervous/anxious.        Objective:   Physical Exam Constitutional:  Obese female, no acute distress  HENT:  Nares patent without discharge  Oropharynx without exudate, palate and uvula are moderately elongated.   Eyes:  Perrla, eomi, no scleral icterus  Neck:  No JVD, no TMG  Cardiovascular:  Normal rate, regular rhythm, no rubs or gallops.  1/6 sem        Intact distal pulses  Pulmonary :  Normal breath sounds, no stridor or respiratory distress   No rales, rhonchi, or wheezing  Abdominal:  Soft, nondistended, bowel sounds present.  No tenderness noted.   Musculoskeletal:  Minimal lower extremity edema noted.  Lymph Nodes:  No cervical lymphadenopathy noted  Skin:  No cyanosis noted  Neurologic:  Alert, appropriate, moves all 4 extremities without obvious deficit.         Assessment & Plan:

## 2012-03-26 ENCOUNTER — Ambulatory Visit (HOSPITAL_BASED_OUTPATIENT_CLINIC_OR_DEPARTMENT_OTHER): Payer: Medicaid Other | Attending: Pulmonary Disease | Admitting: Radiology

## 2012-03-26 VITALS — Ht 65.0 in | Wt 262.0 lb

## 2012-03-26 DIAGNOSIS — G4733 Obstructive sleep apnea (adult) (pediatric): Secondary | ICD-10-CM | POA: Insufficient documentation

## 2012-03-26 DIAGNOSIS — G4710 Hypersomnia, unspecified: Secondary | ICD-10-CM

## 2012-04-07 DIAGNOSIS — G4730 Sleep apnea, unspecified: Secondary | ICD-10-CM

## 2012-04-07 DIAGNOSIS — G4710 Hypersomnia, unspecified: Secondary | ICD-10-CM

## 2012-04-07 NOTE — Procedures (Signed)
[name] [name], [name] [name]              ACCOUNT NO.:  [id number]  MEDICAL RECORD NO.:  [id number]          PATIENT TYPE:  OUT  LOCATION:  SLEEP CENTER                 FACILITY[facility]  PHYSICIAN:  Barbaraann Share, MD,FCCPDATE OF BIRTH:  Apr 11, 1950  DATE OF STUDY:  03/26/2012                           NOCTURNAL POLYSOMNOGRAM  REFERRING PHYSICIAN:  Barbaraann Share, MD,FCCP  INDICATION FOR STUDY:  Hypersomnia with sleep apnea.  EPWORTH SLEEPINESS SCORE:  15.  MEDICATIONS:  SLEEP ARCHITECTURE:  The patient had total sleep time of 294 minutes with no slow-wave sleep and only 57 minutes of REM.  Sleep onset latency was normal at 22 minutes, and REM onset was prolonged at 155 minutes. Sleep efficiency was moderately reduced at 79%.  RESPIRATORY DATA:  The patient was found to have 63 apneas and 118 hypopneas, giving her an apnea-hypopnea index of 37 events per hour. The events occurred in all body positions and there was loud snoring noted throughout.  OXYGEN DATA:  There was O2 desaturation as low as 78%, especially during REM.  CARDIAC DATA:  Rare PAC noted, but no clinically significant arrhythmias were seen.  MOVEMENT-PARASOMNIA:  The patient had small numbers of limb movements, but no significant sleep disruption.  There were no abnormal behaviors noted.  IMPRESSIONS-RECOMMENDATIONS: 1. Moderate-to-severe obstructive sleep apnea/hypopnea syndrome with     an AHI of 37 events per hour and oxygen desaturation as low as 78%.     Treatment for this degree of sleep apnea should focus primarily on     weight loss as well as CPAP. 2. Rare PAC noted, but no clinically significant arrhythmias were     seen.    Barbaraann Share, MD,FCCP Diplomate, American Board of Sleep Medicine   [facility]/MEDQ  D:  04/07/2012 14:20:50  T:  04/07/2012 91:47:82  Job:  [id number]

## 2012-04-12 ENCOUNTER — Ambulatory Visit (INDEPENDENT_AMBULATORY_CARE_PROVIDER_SITE_OTHER): Payer: Medicaid Other | Admitting: Pulmonary Disease

## 2012-04-12 ENCOUNTER — Encounter: Payer: Self-pay | Admitting: Pulmonary Disease

## 2012-04-12 VITALS — BP 170/80 | HR 77 | Temp 98.5°F | Ht 64.0 in | Wt 263.2 lb

## 2012-04-12 DIAGNOSIS — G4733 Obstructive sleep apnea (adult) (pediatric): Secondary | ICD-10-CM

## 2012-04-12 NOTE — Progress Notes (Signed)
  Subjective:    Patient ID: [name] [name], female    DOB: February 12, 1951, 63 y.o.   MRN: [id number]  HPI Patient comes in today for followup of her recent sleep study.  She was found to have moderate to severe obstructive sleep apnea with an AHI of 37 events per hour and significant oxygen desaturation.  I have reviewed her study in detail with her, and answered all of her questions.   Review of Systems  Constitutional: Negative for fever and unexpected weight change.  HENT: Positive for congestion, sore throat, rhinorrhea and postnasal drip. Negative for ear pain, nosebleeds, sneezing, trouble swallowing, dental problem and sinus pressure.   Eyes: Negative for redness and itching.  Respiratory: Positive for cough and wheezing. Negative for chest tightness and shortness of breath.   Cardiovascular: Negative for palpitations and leg swelling.  Gastrointestinal: Positive for vomiting ( with coughing). Negative for nausea.  Genitourinary: Negative for dysuria.  Musculoskeletal: Negative for joint swelling.  Skin: Negative for rash.  Neurological: Positive for headaches.  Hematological: Does not bruise/bleed easily.  Psychiatric/Behavioral: Negative for dysphoric mood. The patient is not nervous/anxious.        Objective:   Physical Exam Obese female in no acute distress Nose without purulence or discharge noted Neck without lymphadenopathy or thyromegaly Lower extremities with mild edema, no cyanosis Alert and oriented, moves all 4 extremities.       Assessment & Plan:

## 2012-04-12 NOTE — Assessment & Plan Note (Signed)
The patient was found to have moderate to severe obstructive sleep apnea by her recent study.  I think her best treatment option at this point would be weight loss while wearing CPAP.  The patient is agreeable to this approach. I will set the patient up on cpap at a moderate pressure level to allow for desensitization, and will troubleshoot the device over the next 4-6weeks if needed.  The pt is to call me if having issues with tolerance.  Will then optimize the pressure once patient is able to wear cpap on a consistent basis.

## 2012-04-12 NOTE — Patient Instructions (Addendum)
Will start you on cpap at a moderate pressure level.  Please call if having issues with tolerance. Work on weight loss. Follow up with me in 6 weeks.

## 2012-05-07 ENCOUNTER — Telehealth: Payer: Self-pay | Admitting: Pulmonary Disease

## 2012-05-07 NOTE — Telephone Encounter (Signed)
Spoke to [email or web address]  don't know what to hold up is but will call pt an make sure she is aware jennifer the RT will call her on Monday Tobe Sos

## 2012-05-19 ENCOUNTER — Encounter: Payer: Self-pay | Admitting: *Deleted

## 2012-05-24 ENCOUNTER — Ambulatory Visit: Payer: Medicaid Other | Admitting: Pulmonary Disease

## 2012-06-07 ENCOUNTER — Encounter: Payer: Self-pay | Admitting: Nurse Practitioner

## 2012-06-07 ENCOUNTER — Ambulatory Visit (INDEPENDENT_AMBULATORY_CARE_PROVIDER_SITE_OTHER): Payer: Medicaid Other | Admitting: Nurse Practitioner

## 2012-06-07 VITALS — BP 130/80 | HR 76 | Resp 18 | Ht 64.0 in | Wt 267.0 lb

## 2012-06-07 DIAGNOSIS — N393 Stress incontinence (female) (male): Secondary | ICD-10-CM

## 2012-06-07 DIAGNOSIS — I10 Essential (primary) hypertension: Secondary | ICD-10-CM

## 2012-06-07 DIAGNOSIS — E119 Type 2 diabetes mellitus without complications: Secondary | ICD-10-CM

## 2012-06-07 NOTE — Progress Notes (Signed)
62 y.o. Legally Separated White female G3P3000 here for pessary check.  Patient has been using following pessary style and size: Ring with support  2 ".  She is not sexually active.  She describes the following issues with the pessary:  Still having to use pads with changing 2 X day. No vaginal discharge.  But less saturation with pad. No sense of problems to  tell if pessary there.  ROS: no vaginal bleeding, no discharge or pelvic pain, no dysuria, trouble voiding or hematuria  Exam:   BP 130/80  Pulse 76  Resp 18  Ht 5\' 4"  (1.626 m)  Wt 267 lb (121.11 kg)  BMI 45.81 kg/m2 General appearance: alert, cooperative and appears stated age Cervical, supraclavicular, and axillary nodes normal.   Pelvic: External genitalia:  no lesions and well estrogenized              Urethra: normal appearing urethra with no masses, tenderness or lesions              Bartholins and Skenes: normal                 Vagina: normal appearing vagina with normal color and discharge, no lesions, no excoriations              Cervix: absent Bimanual Exam:  Uterus: surgically absent, vaginal cuff well healed                               Adnexa:    not indicated                               Anus:  defer exam  Pessary was removed with some difficulty used ring forcepts.  Pessary was cleansed.  Pessary was replaced. Patient tolerated procedure well.    A:  SUI post   Use of Ring Pessay with support 2"         P:  Return to office in 3 months for recheck.    Call back if problems in the interim  An After Visit Summary was printed and given to the patient.

## 2012-06-07 NOTE — Patient Instructions (Addendum)
Place cystocele, rectocele, and pessary patient instructions here.  Recheck in 3 months, Continue with Vagifem 2 X week.

## 2012-06-08 NOTE — Progress Notes (Signed)
Encounter reviewed by Dr. Brook Silva.  

## 2012-06-15 ENCOUNTER — Encounter: Payer: Self-pay | Admitting: Nurse Practitioner

## 2012-06-25 ENCOUNTER — Ambulatory Visit (INDEPENDENT_AMBULATORY_CARE_PROVIDER_SITE_OTHER): Payer: Medicaid Other | Admitting: Pulmonary Disease

## 2012-06-25 ENCOUNTER — Encounter: Payer: Self-pay | Admitting: Pulmonary Disease

## 2012-06-25 VITALS — BP 160/82 | HR 74 | Temp 97.8°F | Ht 64.0 in | Wt 265.6 lb

## 2012-06-25 DIAGNOSIS — G4733 Obstructive sleep apnea (adult) (pediatric): Secondary | ICD-10-CM

## 2012-06-25 NOTE — Assessment & Plan Note (Signed)
Patient is doing very well with CPAP, but needs to have her pressure optimized.  I will call her once I receive the download on the automatic setting.  I've also asked her to keep up with her mass changes and supplies, and to work aggressively on weight loss.  She is to follow up with me in 6 months if doing well.

## 2012-06-25 NOTE — Progress Notes (Signed)
  Subjective:    Patient ID: [name] [name], female    DOB: [birth year], 62 y.o.   MRN: [id number]  HPI The patient comes in today for followup of her  Obstructive sleep apnea.  She is wearing CPAP compliantly, and is having no issues with pressure over mask fit.  She does feel that she has some residual sleepiness during the day, but overall is sleeping well with improved daytime alertness.  I have reminded her that we have yet to optimize her pressure.  She is having some mild puffiness under her eyes in the mornings upon arising, but feels her mask is not too tight.   Review of Systems  Constitutional: Negative for fever and unexpected weight change.  HENT: Negative for ear pain, nosebleeds, congestion, sore throat, rhinorrhea, sneezing, trouble swallowing, dental problem, postnasal drip and sinus pressure.   Eyes: Negative for redness and itching.  Respiratory: Positive for shortness of breath and wheezing. Negative for cough and chest tightness.   Cardiovascular: Negative for palpitations and leg swelling.  Gastrointestinal: Negative for nausea and vomiting.  Genitourinary: Negative for dysuria.  Musculoskeletal: Positive for joint swelling and arthralgias.  Skin: Negative for rash.  Neurological: Negative for headaches.  Hematological: Does not bruise/bleed easily.  Psychiatric/Behavioral: Negative for dysphoric mood. The patient is not nervous/anxious.        Objective:   Physical Exam Obese female in no acute distress Nose without purulent or discharge noted No skin breakdown or pressure necrosis from the CPAP mask Neck without lymphadenopathy or thyromegaly Lower extremities with 1+ edema, cyanosis Alert, does not appear to be sleepy, moves all 4 extremities.       Assessment & Plan:

## 2012-06-25 NOTE — Patient Instructions (Addendum)
Will have your machine set on automatic setting for 2 weeks, and will let you know your optimal pressure once I receive your download. Work on weight loss If doing well, followup with me in 6mos.

## 2012-07-27 ENCOUNTER — Other Ambulatory Visit: Payer: Self-pay | Admitting: Orthopedic Surgery

## 2012-07-29 NOTE — Pre-Procedure Instructions (Signed)
ARISTA KETTLEWELL  07/29/2012   Your procedure is scheduled on:  Monday August 02, 2012.  Report to [facility] 3rd [facility] at 11:30 AM.  Call this number if you have problems the morning of surgery: [phone]   Remember:   Do not eat food or drink liquids after midnight.   Take these medicines the morning of surgery with A SIP OF WATER: Carvedilol (Coreg), Escitalopram, Omeprazole (Prilosec)             Stop taking Aspirin and herbal medications. Do not take any NSAIDs ie: Ibuprofen, Advil, Naproxen or any medication containing Aspirin.  Do NOT take any diabetic medications the morning of your surgery   Do not wear jewelry, make-up or nail polish.  Do not wear lotions, powders, or perfumes. You may wear deodorant.  Do not shave 48 hours prior to surgery.   Do not bring valuables to the hospital.  [facility] is not responsible for any belongings or valuables.  Contacts, dentures or bridgework may not be worn into surgery.  Leave suitcase in the car. After surgery it may be brought to your room.  For patients admitted to the hospital, checkout time is 11:00 AM the day of discharge.   Patients discharged the day of surgery will not be allowed to drive home.  Name and phone number of your driver: Family/Friend  Special Instructions: Shower using CHG 2 nights before surgery and the night before surgery.  If you shower the day of surgery use CHG.  Use special wash - you have one bottle of CHG for all showers.  You should use approximately 1/3 of the bottle for each shower.   Please read over the following fact sheets that you were given: Pain Booklet, Coughing and Deep Breathing, Blood Transfusion Information, Total Joint Packet, MRSA Information and Surgical Site Infection Prevention

## 2012-07-30 ENCOUNTER — Ambulatory Visit (HOSPITAL_COMMUNITY)
Admission: RE | Admit: 2012-07-30 | Discharge: 2012-07-30 | Disposition: A | Discharge status: Home / Self Care | Payer: Medicaid Other | Source: Ambulatory Visit | Attending: Orthopedic Surgery | Admitting: Orthopedic Surgery

## 2012-07-30 ENCOUNTER — Encounter (HOSPITAL_COMMUNITY): Payer: Self-pay

## 2012-07-30 ENCOUNTER — Encounter (HOSPITAL_COMMUNITY): Payer: Self-pay | Admitting: Pharmacy Technician

## 2012-07-30 ENCOUNTER — Encounter (HOSPITAL_COMMUNITY)
Admission: RE | Admit: 2012-07-30 | Discharge: 2012-07-30 | Disposition: A | Discharge status: Home / Self Care | Payer: Medicaid Other | Source: Ambulatory Visit | Attending: Orthopedic Surgery | Admitting: Orthopedic Surgery

## 2012-07-30 DIAGNOSIS — Z01818 Encounter for other preprocedural examination: Secondary | ICD-10-CM | POA: Insufficient documentation

## 2012-07-30 DIAGNOSIS — Z01812 Encounter for preprocedural laboratory examination: Secondary | ICD-10-CM | POA: Insufficient documentation

## 2012-07-30 DIAGNOSIS — Z01810 Encounter for preprocedural cardiovascular examination: Secondary | ICD-10-CM | POA: Insufficient documentation

## 2012-07-30 HISTORY — DX: Diverticulosis of intestine, part unspecified, without perforation or abscess without bleeding: K57.90

## 2012-07-30 LAB — BASIC METABOLIC PANEL
CO2: 29 mEq/L (ref 19–32)
Chloride: 101 mEq/L (ref 96–112)
Glucose, Bld: 194 mg/dL — ABNORMAL HIGH (ref 70–99)
Potassium: 4.1 mEq/L (ref 3.5–5.1)
Sodium: 140 mEq/L (ref 135–145)

## 2012-07-30 LAB — APTT: aPTT: 26 seconds (ref 24–37)

## 2012-07-30 LAB — SURGICAL PCR SCREEN: Staphylococcus aureus: NEGATIVE

## 2012-07-30 LAB — CBC
HCT: 39.4 % (ref 36.0–46.0)
Hemoglobin: 13 g/dL (ref 12.0–15.0)
MCH: 30 pg (ref 26.0–34.0)
MCV: 90.8 fL (ref 78.0–100.0)
Platelets: 274 10*3/uL (ref 150–400)
RBC: 4.34 MIL/uL (ref 3.87–5.11)
WBC: 7.4 10*3/uL (ref 4.0–10.5)

## 2012-07-30 LAB — TYPE AND SCREEN

## 2012-07-30 LAB — ABO/RH: ABO/RH(D): A POS

## 2012-07-30 NOTE — Progress Notes (Signed)
Anesthesiology Chart Review:  Patient is a 62 year old female scheduled for left TKA on 08/02/12 by Dr. Luiz Blare.  PAT was on 07/30/12.  History noted and includes DM, morbid obesity, OSA with CPAP use (Dr. Shelle Iron), uterine cancer, HTN, GERD.  She has seen cardiologist Dr. Jacinto Halim in the past--last office note is still pending.  We did receive an echo from 04/24/10 showing mild concentric LVH, normal LV systolic function, EF 66%, mild LAE, trace PR/TR.  She had a normal myocardial perfusion imaging study without evidence of ischemia or scar and normal LV EF on 03/22/10.    EKG on 04/22/12 (Dr. Jacinto Halim) showed NSR, rightward axis, poor r wave progression. Poor r wave progression more notable than on her EKG from 09/05/09.  Preoperative labs noted.  UA was not ordered by surgeon.  CXR on 07/30/12 showed: Low lung volumes with bibasilar volume loss and bronchitic change. No new abnormality seen.   She will be evaluated by her assigned anesthesiologist on the day of surgery.  If no acute cardiopulmonary symptoms then I would anticipate that she could proceed as planned.  Velna Ochs [facility]/Anesthesiology Phone [phone] 07/30/2012 4:43 PM

## 2012-07-30 NOTE — Progress Notes (Addendum)
Pt denies SOB and chest pain but sees Dr. Jacinto Halim as her cardiologist. Requests were made for EKG, echo, and stress test results; records were received and placed in pt chart. Pt advised to bring BIPAP mask on DOS since positive for sleep apnea in addition to Mupirocin if positive PCR result. Revonda Standard, PA  to review chest x ray and EKG.

## 2012-08-01 MED ORDER — DEXTROSE 5 % IV SOLN
3.0000 g | INTRAVENOUS | Status: AC
  Administered 2012-08-02: 3 g via INTRAVENOUS
  Filled 2012-08-01: qty 3000

## 2012-08-02 ENCOUNTER — Ambulatory Visit (HOSPITAL_COMMUNITY): Payer: Medicaid Other | Admitting: Certified Registered Nurse Anesthetist

## 2012-08-02 ENCOUNTER — Encounter (HOSPITAL_COMMUNITY): Payer: Self-pay | Admitting: *Deleted

## 2012-08-02 ENCOUNTER — Encounter (HOSPITAL_COMMUNITY): Payer: Self-pay | Admitting: Vascular Surgery

## 2012-08-02 ENCOUNTER — Encounter (HOSPITAL_COMMUNITY)
Admission: RE | Disposition: A | Discharge status: Home Health | Payer: Self-pay | Source: Ambulatory Visit | Attending: Orthopedic Surgery

## 2012-08-02 ENCOUNTER — Inpatient Hospital Stay (HOSPITAL_COMMUNITY)
Admission: RE | Admit: 2012-08-02 | Discharge: 2012-08-04 | DRG: 470 | Disposition: A | Discharge status: Home Health | Payer: Medicaid Other | Source: Ambulatory Visit | Attending: Orthopedic Surgery | Admitting: Orthopedic Surgery

## 2012-08-02 DIAGNOSIS — I10 Essential (primary) hypertension: Secondary | ICD-10-CM | POA: Diagnosis present

## 2012-08-02 DIAGNOSIS — K219 Gastro-esophageal reflux disease without esophagitis: Secondary | ICD-10-CM | POA: Diagnosis present

## 2012-08-02 DIAGNOSIS — G4733 Obstructive sleep apnea (adult) (pediatric): Secondary | ICD-10-CM | POA: Diagnosis present

## 2012-08-02 DIAGNOSIS — Z8542 Personal history of malignant neoplasm of other parts of uterus: Secondary | ICD-10-CM

## 2012-08-02 DIAGNOSIS — Z79899 Other long term (current) drug therapy: Secondary | ICD-10-CM

## 2012-08-02 DIAGNOSIS — E785 Hyperlipidemia, unspecified: Secondary | ICD-10-CM | POA: Diagnosis present

## 2012-08-02 DIAGNOSIS — E119 Type 2 diabetes mellitus without complications: Secondary | ICD-10-CM | POA: Diagnosis present

## 2012-08-02 DIAGNOSIS — N393 Stress incontinence (female) (male): Secondary | ICD-10-CM | POA: Diagnosis present

## 2012-08-02 DIAGNOSIS — IMO0002 Reserved for concepts with insufficient information to code with codable children: Principal | ICD-10-CM | POA: Diagnosis present

## 2012-08-02 DIAGNOSIS — M1710 Unilateral primary osteoarthritis, unspecified knee: Principal | ICD-10-CM | POA: Diagnosis present

## 2012-08-02 DIAGNOSIS — Z7982 Long term (current) use of aspirin: Secondary | ICD-10-CM

## 2012-08-02 DIAGNOSIS — M1712 Unilateral primary osteoarthritis, left knee: Secondary | ICD-10-CM

## 2012-08-02 HISTORY — PX: TOTAL KNEE ARTHROPLASTY: SHX125

## 2012-08-02 LAB — GLUCOSE, CAPILLARY
Glucose-Capillary: 140 mg/dL — ABNORMAL HIGH (ref 70–99)
Glucose-Capillary: 181 mg/dL — ABNORMAL HIGH (ref 70–99)

## 2012-08-02 SURGERY — ARTHROPLASTY, KNEE, TOTAL
Anesthesia: General | Site: Knee | Laterality: Left | Wound class: Clean

## 2012-08-02 MED ORDER — CEFUROXIME SODIUM 1.5 G IJ SOLR
INTRAMUSCULAR | Status: DC | PRN
  Administered 2012-08-02: 1.5 g

## 2012-08-02 MED ORDER — FENTANYL CITRATE 0.05 MG/ML IJ SOLN
INTRAMUSCULAR | Status: AC
  Administered 2012-08-02: 100 ug
  Filled 2012-08-02: qty 2

## 2012-08-02 MED ORDER — PROPOFOL 10 MG/ML IV BOLUS
INTRAVENOUS | Status: DC | PRN
  Administered 2012-08-02: 200 mg via INTRAVENOUS

## 2012-08-02 MED ORDER — ONDANSETRON HCL 4 MG/2ML IJ SOLN
INTRAMUSCULAR | Status: DC | PRN
  Administered 2012-08-02: 4 mg via INTRAVENOUS

## 2012-08-02 MED ORDER — PNEUMOCOCCAL VAC POLYVALENT 25 MCG/0.5ML IJ INJ
0.5000 mL | INJECTION | INTRAMUSCULAR | Status: AC
  Administered 2012-08-03: 0.5 mL via INTRAMUSCULAR
  Filled 2012-08-02: qty 0.5

## 2012-08-02 MED ORDER — INSULIN ASPART 100 UNIT/ML ~~LOC~~ SOLN
0.0000 [IU] | Freq: Three times a day (TID) | SUBCUTANEOUS | Status: DC
  Administered 2012-08-02 – 2012-08-04 (×5): 3 [IU] via SUBCUTANEOUS

## 2012-08-02 MED ORDER — CEFUROXIME SODIUM 1.5 G IJ SOLR
INTRAMUSCULAR | Status: AC
  Filled 2012-08-02: qty 1.5

## 2012-08-02 MED ORDER — FENOFIBRATE 160 MG PO TABS
160.0000 mg | ORAL_TABLET | Freq: Every day | ORAL | Status: DC
  Administered 2012-08-02 – 2012-08-04 (×3): 160 mg via ORAL
  Filled 2012-08-02 (×3): qty 1

## 2012-08-02 MED ORDER — LIDOCAINE HCL (CARDIAC) 10 MG/ML IV SOLN
INTRAVENOUS | Status: DC | PRN
  Administered 2012-08-02: 100 mg via INTRAVENOUS

## 2012-08-02 MED ORDER — HYDROMORPHONE HCL PF 1 MG/ML IJ SOLN
INTRAMUSCULAR | Status: AC
  Filled 2012-08-02: qty 1

## 2012-08-02 MED ORDER — ARTIFICIAL TEARS OP OINT
TOPICAL_OINTMENT | OPHTHALMIC | Status: DC | PRN
  Administered 2012-08-02: 1 via OPHTHALMIC

## 2012-08-02 MED ORDER — METHOCARBAMOL 100 MG/ML IJ SOLN
500.0000 mg | Freq: Four times a day (QID) | INTRAVENOUS | Status: DC | PRN
  Administered 2012-08-02: 500 mg via INTRAVENOUS
  Filled 2012-08-02: qty 5

## 2012-08-02 MED ORDER — SODIUM CHLORIDE 0.9 % IV SOLN
INTRAVENOUS | Status: DC
  Administered 2012-08-03: via INTRAVENOUS

## 2012-08-02 MED ORDER — ESCITALOPRAM OXALATE 5 MG PO TABS
5.0000 mg | ORAL_TABLET | ORAL | Status: DC
  Administered 2012-08-02 – 2012-08-04 (×2): 5 mg via ORAL
  Filled 2012-08-02 (×3): qty 1

## 2012-08-02 MED ORDER — METHOCARBAMOL 500 MG PO TABS
500.0000 mg | ORAL_TABLET | Freq: Four times a day (QID) | ORAL | Status: DC | PRN
  Administered 2012-08-03 – 2012-08-04 (×5): 500 mg via ORAL
  Filled 2012-08-02 (×5): qty 1

## 2012-08-02 MED ORDER — METOCLOPRAMIDE HCL 10 MG PO TABS: 5.0000 mg | ORAL_TABLET | Freq: Three times a day (TID) | ORAL | Status: DC | PRN

## 2012-08-02 MED ORDER — HYDROMORPHONE HCL PF 1 MG/ML IJ SOLN
1.0000 mg | INTRAMUSCULAR | Status: DC | PRN
  Administered 2012-08-02 – 2012-08-03 (×3): 1 mg via INTRAVENOUS
  Administered 2012-08-03: 2 mg via INTRAVENOUS
  Administered 2012-08-03 – 2012-08-04 (×2): 1 mg via INTRAVENOUS
  Filled 2012-08-02 (×4): qty 1
  Filled 2012-08-02: qty 2

## 2012-08-02 MED ORDER — POLYETHYLENE GLYCOL 3350 17 G PO PACK
17.0000 g | PACK | Freq: Every day | ORAL | Status: DC | PRN
  Administered 2012-08-04: 17 g via ORAL
  Filled 2012-08-02: qty 1

## 2012-08-02 MED ORDER — GLIMEPIRIDE 1 MG PO TABS
1.0000 mg | ORAL_TABLET | Freq: Every day | ORAL | Status: DC
  Administered 2012-08-03 – 2012-08-04 (×2): 1 mg via ORAL
  Filled 2012-08-02 (×3): qty 1

## 2012-08-02 MED ORDER — BENAZEPRIL HCL 40 MG PO TABS
40.0000 mg | ORAL_TABLET | Freq: Every day | ORAL | Status: DC
  Administered 2012-08-03 – 2012-08-04 (×2): 40 mg via ORAL
  Filled 2012-08-02 (×2): qty 1

## 2012-08-02 MED ORDER — POVIDONE-IODINE 7.5 % EX SOLN
Freq: Once | CUTANEOUS | Status: DC
  Filled 2012-08-02: qty 118

## 2012-08-02 MED ORDER — LACTATED RINGERS IV SOLN: INTRAVENOUS | Status: DC

## 2012-08-02 MED ORDER — METOCLOPRAMIDE HCL 5 MG/ML IJ SOLN: 5.0000 mg | Freq: Three times a day (TID) | INTRAMUSCULAR | Status: DC | PRN

## 2012-08-02 MED ORDER — ONDANSETRON HCL 4 MG PO TABS: 4.0000 mg | ORAL_TABLET | Freq: Four times a day (QID) | ORAL | Status: DC | PRN

## 2012-08-02 MED ORDER — ONDANSETRON HCL 4 MG/2ML IJ SOLN
4.0000 mg | Freq: Four times a day (QID) | INTRAMUSCULAR | Status: DC | PRN
  Administered 2012-08-02: 4 mg via INTRAVENOUS
  Filled 2012-08-02: qty 2

## 2012-08-02 MED ORDER — ZOLPIDEM TARTRATE 5 MG PO TABS: 5.0000 mg | ORAL_TABLET | Freq: Every evening | ORAL | Status: DC | PRN

## 2012-08-02 MED ORDER — METFORMIN HCL 500 MG PO TABS
1000.0000 mg | ORAL_TABLET | Freq: Two times a day (BID) | ORAL | Status: DC
  Administered 2012-08-02 – 2012-08-04 (×4): 1000 mg via ORAL
  Filled 2012-08-02 (×6): qty 2

## 2012-08-02 MED ORDER — 0.9 % SODIUM CHLORIDE (POUR BTL) OPTIME
TOPICAL | Status: DC | PRN
  Administered 2012-08-02: 1000 mL

## 2012-08-02 MED ORDER — FERROUS SULFATE 325 (65 FE) MG PO TABS
325.0000 mg | ORAL_TABLET | Freq: Two times a day (BID) | ORAL | Status: DC
  Administered 2012-08-02 – 2012-08-04 (×4): 325 mg via ORAL
  Filled 2012-08-02 (×6): qty 1

## 2012-08-02 MED ORDER — ALUM & MAG HYDROXIDE-SIMETH 200-200-20 MG/5ML PO SUSP: 30.0000 mL | ORAL | Status: DC | PRN

## 2012-08-02 MED ORDER — MIDAZOLAM HCL 2 MG/2ML IJ SOLN
INTRAMUSCULAR | Status: AC
  Administered 2012-08-02: 2 mg
  Filled 2012-08-02: qty 2

## 2012-08-02 MED ORDER — PANTOPRAZOLE SODIUM 40 MG PO TBEC
40.0000 mg | DELAYED_RELEASE_TABLET | Freq: Every day | ORAL | Status: DC
  Administered 2012-08-03 – 2012-08-04 (×2): 40 mg via ORAL
  Filled 2012-08-02 (×2): qty 1

## 2012-08-02 MED ORDER — ASPIRIN EC 325 MG PO TBEC
325.0000 mg | DELAYED_RELEASE_TABLET | Freq: Two times a day (BID) | ORAL | Status: DC
  Administered 2012-08-03 – 2012-08-04 (×3): 325 mg via ORAL
  Filled 2012-08-02 (×5): qty 1

## 2012-08-02 MED ORDER — CEFAZOLIN SODIUM-DEXTROSE 2-3 GM-% IV SOLR
2.0000 g | Freq: Four times a day (QID) | INTRAVENOUS | Status: AC
  Administered 2012-08-02 – 2012-08-03 (×2): 2 g via INTRAVENOUS
  Filled 2012-08-02 (×2): qty 50

## 2012-08-02 MED ORDER — DIPHENHYDRAMINE HCL 12.5 MG/5ML PO ELIX: 12.5000 mg | ORAL_SOLUTION | ORAL | Status: DC | PRN

## 2012-08-02 MED ORDER — DOCUSATE SODIUM 100 MG PO CAPS
100.0000 mg | ORAL_CAPSULE | Freq: Two times a day (BID) | ORAL | Status: DC
  Administered 2012-08-02 – 2012-08-04 (×4): 100 mg via ORAL
  Filled 2012-08-02 (×4): qty 1

## 2012-08-02 MED ORDER — SODIUM CHLORIDE 0.9 % IR SOLN
Status: DC | PRN
  Administered 2012-08-02: 3000 mL

## 2012-08-02 MED ORDER — CARVEDILOL 12.5 MG PO TABS
12.5000 mg | ORAL_TABLET | Freq: Two times a day (BID) | ORAL | Status: DC
  Administered 2012-08-02 – 2012-08-04 (×4): 12.5 mg via ORAL
  Filled 2012-08-02 (×6): qty 1

## 2012-08-02 MED ORDER — LACTATED RINGERS IV SOLN
INTRAVENOUS | Status: DC | PRN
  Administered 2012-08-02 (×2): via INTRAVENOUS

## 2012-08-02 MED ORDER — ATORVASTATIN CALCIUM 40 MG PO TABS
40.0000 mg | ORAL_TABLET | Freq: Every day | ORAL | Status: DC
  Administered 2012-08-02 – 2012-08-03 (×2): 40 mg via ORAL
  Filled 2012-08-02 (×3): qty 1

## 2012-08-02 MED ORDER — HYDROMORPHONE HCL PF 1 MG/ML IJ SOLN
INTRAMUSCULAR | Status: AC
  Administered 2012-08-02 (×2): 0.5 mg
  Filled 2012-08-02: qty 1

## 2012-08-02 MED ORDER — ESTRADIOL 10 MCG VA TABS: 0.5000 | ORAL_TABLET | Freq: Two times a day (BID) | VAGINAL | Status: DC

## 2012-08-02 MED ORDER — FENTANYL CITRATE 0.05 MG/ML IJ SOLN
INTRAMUSCULAR | Status: DC | PRN
  Administered 2012-08-02: 50 ug via INTRAVENOUS
  Administered 2012-08-02: 75 ug via INTRAVENOUS
  Administered 2012-08-02: 50 ug via INTRAVENOUS
  Administered 2012-08-02 (×3): 25 ug via INTRAVENOUS

## 2012-08-02 MED ORDER — OXYCODONE HCL 5 MG PO TABS
5.0000 mg | ORAL_TABLET | ORAL | Status: DC | PRN
  Administered 2012-08-02 – 2012-08-03 (×3): 10 mg via ORAL
  Filled 2012-08-02 (×3): qty 2

## 2012-08-02 MED FILL — Fentanyl Citrate Inj 0.05 MG/ML: INTRAMUSCULAR | Qty: 6 | Status: AC

## 2012-08-02 SURGICAL SUPPLY — 63 items
BANDAGE ELASTIC 6 VELCRO ST LF (GAUZE/BANDAGES/DRESSINGS) ×2 IMPLANT
BANDAGE ESMARK 6X9 LF (GAUZE/BANDAGES/DRESSINGS) ×1 IMPLANT
BENZOIN TINCTURE PRP APPL 2/3 (GAUZE/BANDAGES/DRESSINGS) ×2 IMPLANT
BLADE SAGITTAL 25.0X1.19X90 (BLADE) ×2 IMPLANT
BLADE SAW SAG 90X13X1.27 (BLADE) ×2 IMPLANT
BNDG ESMARK 6X9 LF (GAUZE/BANDAGES/DRESSINGS) ×2
BOWL SMART MIX CTS (DISPOSABLE) ×2 IMPLANT
CAPT RP KNEE ×2 IMPLANT
CEMENT HV SMART SET (Cement) ×4 IMPLANT
CLOTH BEACON ORANGE TIMEOUT ST (SAFETY) ×2 IMPLANT
CLSR STERI-STRIP ANTIMIC 1/2X4 (GAUZE/BANDAGES/DRESSINGS) ×2 IMPLANT
COVER SURGICAL LIGHT HANDLE (MISCELLANEOUS) ×2 IMPLANT
CUFF TOURNIQUET SINGLE 34IN LL (TOURNIQUET CUFF) ×2 IMPLANT
CUFF TOURNIQUET SINGLE 44IN (TOURNIQUET CUFF) IMPLANT
DRAPE EXTREMITY T 121X128X90 (DRAPE) ×2 IMPLANT
DRAPE U-SHAPE 47X51 STRL (DRAPES) ×2 IMPLANT
DRSG PAD ABDOMINAL 8X10 ST (GAUZE/BANDAGES/DRESSINGS) ×2 IMPLANT
DURAPREP 26ML APPLICATOR (WOUND CARE) ×2 IMPLANT
ELECT REM PT RETURN 9FT ADLT (ELECTROSURGICAL) ×2
ELECTRODE REM PT RTRN 9FT ADLT (ELECTROSURGICAL) ×1 IMPLANT
EVACUATOR 1/8 PVC DRAIN (DRAIN) ×2 IMPLANT
FACESHIELD LNG OPTICON STERILE (SAFETY) ×2 IMPLANT
GAUZE XEROFORM 5X9 LF (GAUZE/BANDAGES/DRESSINGS) ×2 IMPLANT
GLOVE BIOGEL PI IND STRL 8 (GLOVE) ×2 IMPLANT
GLOVE BIOGEL PI INDICATOR 8 (GLOVE) ×2
GLOVE ECLIPSE 7.5 STRL STRAW (GLOVE) ×4 IMPLANT
GOWN PREVENTION PLUS LG XLONG (DISPOSABLE) IMPLANT
GOWN STRL NON-REIN LRG LVL3 (GOWN DISPOSABLE) ×2 IMPLANT
GOWN STRL REIN XL XLG (GOWN DISPOSABLE) ×4 IMPLANT
HANDPIECE INTERPULSE COAX TIP (DISPOSABLE) ×1
HOOD PEEL AWAY FACE SHEILD DIS (HOOD) ×6 IMPLANT
IMMOBILIZER KNEE 20 (SOFTGOODS)
IMMOBILIZER KNEE 20 THIGH 36 (SOFTGOODS) IMPLANT
IMMOBILIZER KNEE 22 UNIV (SOFTGOODS) ×2 IMPLANT
IMMOBILIZER KNEE 24 THIGH 36 (MISCELLANEOUS) IMPLANT
IMMOBILIZER KNEE 24 UNIV (MISCELLANEOUS)
KIT BASIN OR (CUSTOM PROCEDURE TRAY) ×2 IMPLANT
KIT ROOM TURNOVER OR (KITS) ×2 IMPLANT
MANIFOLD NEPTUNE II (INSTRUMENTS) ×2 IMPLANT
NEEDLE HYPO 25GX1X1/2 BEV (NEEDLE) IMPLANT
NS IRRIG 1000ML POUR BTL (IV SOLUTION) ×2 IMPLANT
PACK TOTAL JOINT (CUSTOM PROCEDURE TRAY) ×2 IMPLANT
PAD ARMBOARD 7.5X6 YLW CONV (MISCELLANEOUS) ×4 IMPLANT
PAD CAST 4YDX4 CTTN HI CHSV (CAST SUPPLIES) ×1 IMPLANT
PADDING CAST COTTON 4X4 STRL (CAST SUPPLIES) ×1
PADDING CAST COTTON 6X4 STRL (CAST SUPPLIES) ×2 IMPLANT
SET HNDPC FAN SPRY TIP SCT (DISPOSABLE) ×1 IMPLANT
SPONGE GAUZE 4X4 12PLY (GAUZE/BANDAGES/DRESSINGS) ×2 IMPLANT
STAPLER VISISTAT 35W (STAPLE) IMPLANT
STRIP CLOSURE SKIN 1/2X4 (GAUZE/BANDAGES/DRESSINGS) ×2 IMPLANT
SUCTION FRAZIER TIP 10 FR DISP (SUCTIONS) ×2 IMPLANT
SUT MNCRL AB 3-0 PS2 18 (SUTURE) ×2 IMPLANT
SUT MON AB 3-0 SH 27 (SUTURE)
SUT MON AB 3-0 SH27 (SUTURE) IMPLANT
SUT VIC AB 0 CTB1 27 (SUTURE) ×4 IMPLANT
SUT VIC AB 1 CT1 27 (SUTURE) ×3
SUT VIC AB 1 CT1 27XBRD ANBCTR (SUTURE) ×3 IMPLANT
SUT VIC AB 2-0 CTB1 (SUTURE) ×4 IMPLANT
SYR CONTROL 10ML LL (SYRINGE) IMPLANT
TOWEL OR 17X24 6PK STRL BLUE (TOWEL DISPOSABLE) ×2 IMPLANT
TOWEL OR 17X26 10 PK STRL BLUE (TOWEL DISPOSABLE) ×2 IMPLANT
TRAY FOLEY CATH 14FR (SET/KITS/TRAYS/PACK) ×2 IMPLANT
WATER STERILE IRR 1000ML POUR (IV SOLUTION) ×4 IMPLANT

## 2012-08-02 NOTE — Anesthesia Procedure Notes (Signed)
Procedure Name: LMA Insertion Date/Time: 08/02/2012 12:22 PM Performed by: Jerilee Hoh Pre-anesthesia Checklist: Patient identified, Emergency Drugs available, Suction available and Patient being monitored Patient Re-evaluated:Patient Re-evaluated prior to inductionOxygen Delivery Method: Circle system utilized Preoxygenation: Pre-oxygenation with 100% oxygen Intubation Type: IV induction Ventilation: Mask ventilation without difficulty LMA: LMA with gastric port inserted LMA Size: 4.0 Tube type: Oral Number of attempts: 1 Placement Confirmation: positive ETCO2 and breath sounds checked- equal and bilateral Tube secured with: Tape Dental Injury: Teeth and Oropharynx as per pre-operative assessment

## 2012-08-02 NOTE — Progress Notes (Signed)
Orthopedic Tech Progress Note Patient Details:  [name] [name] September 05, 1950 [id number] Patient unable to tolrate 60 set at 40 rn to increase as patient can tolrate. CPM Left Knee CPM Left Knee: On Left Knee Flexion (Degrees): 60 Left Knee Extension (Degrees): 0 Additional Comments: applied overhead frame   Jennye Moccasin 08/02/2012, 3:32 PM

## 2012-08-02 NOTE — Anesthesia Preprocedure Evaluation (Addendum)
Anesthesia Evaluation  Patient identified by MRN, date of birth, ID band Patient awake    Reviewed: Allergy & Precautions, H&P , NPO status , Patient's Chart, lab work & pertinent test results, reviewed documented beta blocker date and time   History of Anesthesia Complications Negative for: history of anesthetic complications  Airway Mallampati: III TM Distance: >3 FB Neck ROM: Full    Dental  (+) Partial Upper, Partial Lower and Dental Advisory Given   Pulmonary sleep apnea and Continuous Positive Airway Pressure Ventilation ,  breath sounds clear to auscultation        Cardiovascular hypertension, Pt. on home beta blockers Rhythm:Regular Rate:Normal     Neuro/Psych    GI/Hepatic GERD-  ,  Endo/Other  diabetes, Type 2  Renal/GU      Musculoskeletal   Abdominal (+) + obese,  Abdomen: soft. Bowel sounds: normal.  Peds  Hematology   Anesthesia Other Findings   Reproductive/Obstetrics                          Anesthesia Physical Anesthesia Plan  ASA: III  Anesthesia Plan: General   Post-op Pain Management:    Induction: Intravenous  Airway Management Planned: LMA  Additional Equipment:   Intra-op Plan:   Post-operative Plan: Extubation in OR  Informed Consent: I have reviewed the patients History and Physical, chart, labs and discussed the procedure including the risks, benefits and alternatives for the proposed anesthesia with the patient or authorized representative who has indicated his/her understanding and acceptance.   Dental advisory given  Plan Discussed with: Anesthesiologist  Anesthesia Plan Comments:         Anesthesia Quick Evaluation

## 2012-08-02 NOTE — Progress Notes (Signed)
Pt has brought home machine. [employer] has been notified at this time. Pt encouraged to call if needing any assistance with placing machine on.

## 2012-08-02 NOTE — Transfer of Care (Signed)
Immediate Anesthesia Transfer of Care Note  Patient: [name] [name]  Procedure(s) Performed: Procedure(s) with comments: TOTAL KNEE ARTHROPLASTY (Left) - left total knee arthroplasty  Patient Location: PACU  Anesthesia Type:General  Level of Consciousness: sedated  Airway & Oxygen Therapy: Patient Spontanous Breathing and Patient connected to face mask oxygen  Post-op Assessment: Report given to PACU RN, Post -op Vital signs reviewed and stable and Patient moving all extremities  Post vital signs: Reviewed and stable  Complications: No apparent anesthesia complications

## 2012-08-02 NOTE — Anesthesia Postprocedure Evaluation (Signed)
  Anesthesia Post-op Note  Patient: [name] [name]  Procedure(s) Performed: Procedure(s) with comments: TOTAL KNEE ARTHROPLASTY (Left) - left total knee arthroplasty  Patient Location: PACU  Anesthesia Type:GA combined with regional for post-op pain  Level of Consciousness: awake and alert   Airway and Oxygen Therapy: Patient Spontanous Breathing  Post-op Pain: mild  Post-op Assessment: Post-op Vital signs reviewed, Patient's Cardiovascular Status Stable, Respiratory Function Stable, Patent Airway, No signs of Nausea or vomiting and Pain level controlled  Post-op Vital Signs: stable  Complications: No apparent anesthesia complications

## 2012-08-02 NOTE — Preoperative (Signed)
Beta Blockers   Reason not to administer Beta Blockers:Not Applicable 

## 2012-08-02 NOTE — H&P (Signed)
TOTAL KNEE ADMISSION H&P  Patient is being admitted for left total knee arthroplasty.  Subjective:  Chief Complaint:left knee pain.  HPI: [name] [name], 62 y.o. female, has a history of pain and functional disability in the left knee due to arthritis and has failed non-surgical conservative treatments for greater than 12 weeks to includeNSAID's and/or analgesics, corticosteriod injections, viscosupplementation injections, flexibility and strengthening excercises, use of assistive devices, weight reduction as appropriate and activity modification.  Onset of symptoms was gradual, starting 4 years ago with gradually worsening course since that time. The patient noted no past surgery on the left knee(s).  Patient currently rates pain in the left knee(s) at 8 out of 10 with activity. Patient has night pain, worsening of pain with activity and weight bearing, pain that interferes with activities of daily living, pain with passive range of motion, crepitus and joint swelling.  Patient has evidence of subchondral sclerosis and periarticular osteophytes by imaging studies. This patient has had failure of conservative care. There is no active infection.  Patient Active Problem List   Diagnosis Date Noted  . Urinary, incontinence, stress female 06/07/2012  . HTN (hypertension) 06/07/2012  . Diabetes 06/07/2012  . OSA (obstructive sleep apnea) 02/16/2012   Past Medical History  Diagnosis Date  . Hyperlipidemia   . GERD (gastroesophageal reflux disease)   . Cancer, uterine 05/2003    F1G01,G1  . Diabetes 2005  . HTN (hypertension) 2005  . Biliary dyskinesia 07/2006  . Urinary, incontinence, stress female 12/13    using P[place] 01/2012  . Gastroparesis 7/08    Dr. Loreta Ave  . Sleep apnea   . Arthritis     Hx: of T/O  . Diverticulosis     Hx: of    Past Surgical History  Procedure Laterality Date  . Tubal ligation    . Knee arthroscopy      left  . Total abdominal hysterectomy w/ bilateral  salpingoophorectomy  05/2003    endometrial carcinoma F1G01, G1  . Cholecystectomy, laparoscopic  10/2006  . Colonoscopy w/ biopsies  10/04    polyp  . Colonoscopy w/ biopsies  02/12/05    benign polyp  . Colonoscopy with esophagogastroduodenoscopy (egd)  09/14/06    2 polyps, diverticula, diffuse gastritis  . Colonoscopy w/ biopsies  10/26/09    1 polyp benign, recheck 5 years  . Abdominal hysterectomy      Prescriptions prior to admission  Medication Sig Dispense Refill  . acetaminophen (TYLENOL) 500 MG tablet Take 1,500 mg by mouth every 6 (six) hours as needed for pain.      Marland Kitchen aspirin 81 MG tablet Take 81 mg by mouth daily.      Marland Kitchen atorvastatin (LIPITOR) 40 MG tablet Take 40 mg by mouth daily.      . benazepril (LOTENSIN) 40 MG tablet Take 40 mg by mouth daily.      . carvedilol (COREG) 12.5 MG tablet Take 12.5 mg by mouth 2 (two) times daily with a meal.      . Choline Fenofibrate (TRILIPIX) 135 MG capsule Take 135 mg by mouth daily.      . ergocalciferol (VITAMIN D2) 50000 UNITS capsule Take 50,000 Units by mouth once a week.      . escitalopram (LEXAPRO) 10 MG tablet Take 5 mg by mouth 3 (three) times a week.       . Estradiol (VAGIFEM) 10 MCG TABS Place 0.5 tablets vaginally 2 (two) times daily.      . fish  oil-omega-3 fatty acids 1000 MG capsule Take 1 g by mouth daily.      Marland Kitchen glimepiride (AMARYL) 1 MG tablet Take 1 mg by mouth daily before breakfast.      . ibuprofen (ADVIL,MOTRIN) 200 MG tablet Take 800 mg by mouth every 6 (six) hours as needed for pain.      . metFORMIN (GLUCOPHAGE) 1000 MG tablet Take 1,000 mg by mouth 2 (two) times daily with a meal.      . naproxen sodium (ALEVE) 220 MG tablet Take 220 mg by mouth 2 (two) times daily with a meal.      . omeprazole (PRILOSEC) 20 MG capsule Take 20 mg by mouth every morning.       Allergies  Allergen Reactions  . Codeine Palpitations    History  Substance Use Topics  . Smoking status: Never Smoker   . Smokeless tobacco:  Never Used  . Alcohol Use: No    Family History  Problem Relation Age of Onset  . Heart disease Father 31    CABG  . Lung cancer Father 25    lung  . Hypertension Father   . Diabetes Father   . Heart disease Sister 38    CABG  . Diabetes Sister 21  . Heart disease Brother 67    MI with stents  . Pancreatic cancer Other     x 3 - 2 MAunts and 1 P Aunts.   . Lupus Other     Pat Aunt  . Hypertension Mother   . Diabetes Mother   . Hypertension Sister     x2   . Diabetes Sister   . Hypertension Brother   . Diabetes Brother      ROS  Objective:  Physical Exam  Vital signs in last 24 hours: Temp:  [97.7 F (36.5 C)] 97.7 F (36.5 C) (06/09 0957) Pulse Rate:  [63-71] 66 (06/09 1136) Resp:  [12-20] 15 (06/09 1136) BP: (159-196)/(51-85) 176/70 mmHg (06/09 1135) SpO2:  [90 %-100 %] 98 % (06/09 1136)  Labs:   Estimated body mass index is 45.57 kg/(m^2) as calculated from the following:   Height as of 07/30/12: 5\' 4"  (1.626 m).   Weight as of 06/25/12: 120.475 kg (265 lb 9.6 oz).   Imaging Review Plain radiographs demonstrate moderate degenerative joint disease of the left knee(s). The overall alignment ismild valgus. The bone quality appears to be good for age and reported activity level.  Assessment/Plan:  End stage arthritis, left knee   The patient history, physical examination, clinical judgment of the provider and imaging studies are consistent with end stage degenerative joint disease of the left knee(s) and total knee arthroplasty is deemed medically necessary. The treatment options including medical management, injection therapy arthroscopy and arthroplasty were discussed at length. The risks and benefits of total knee arthroplasty were presented and reviewed. The risks due to aseptic loosening, infection, stiffness, patella tracking problems, thromboembolic complications and other imponderables were discussed. The patient acknowledged the explanation, agreed to  proceed with the plan and consent was signed. Patient is being admitted for inpatient treatment for surgery, pain control, PT, OT, prophylactic antibiotics, VTE prophylaxis, progressive ambulation and ADL's and discharge planning. The patient is planning to be discharged home with home health services

## 2012-08-02 NOTE — Progress Notes (Signed)
Verified with Dr. Luiz Blare that it is left knee replacement.

## 2012-08-02 NOTE — Brief Op Note (Signed)
08/02/2012  2:07 PM  PATIENT:  [name] [name]  62 y.o. female  PRE-OPERATIVE DIAGNOSIS:  DEGENERATIVE JOINT DISEASE  POST-OPERATIVE DIAGNOSIS:  DEGENERATIVE JOINT DISEASE  PROCEDURE:  Procedure(s) with comments: TOTAL KNEE ARTHROPLASTY (Left) - left total knee arthroplasty  SURGEON:  Surgeon(s) and Role:    * Harvie Junior, MD - Primary  PHYSICIAN ASSISTANT:   ASSISTANTS: bethune   ANESTHESIA:   general  EBL:  Total I/O In: 1100 [I.V.:1100] Out: 250 [Urine:200; Blood:50]  BLOOD ADMINISTERED:none  DRAINS: (1) Hemovact drain(s) in the l knmee with  Suction Open   LOCAL MEDICATIONS USED:  NONE  SPECIMEN:  No Specimen  DISPOSITION OF SPECIMEN:  N/A  COUNTS:  YES  TOURNIQUET:   Total Tourniquet Time Documented: Thigh (Left) - 63 minutes Total: Thigh (Left) - 63 minutes   DICTATION: .Other Dictation: Dictation Number [id number]  PLAN OF CARE: Admit to inpatient   PATIENT DISPOSITION:  PACU - hemodynamically stable.   Delay start of Pharmacological VTE agent (>24hrs) due to surgical blood loss or risk of bleeding: no

## 2012-08-03 ENCOUNTER — Encounter (HOSPITAL_COMMUNITY): Payer: Self-pay | Admitting: Orthopedic Surgery

## 2012-08-03 LAB — CBC
MCH: 30 pg (ref 26.0–34.0)
MCHC: 32.6 g/dL (ref 30.0–36.0)
MCV: 92.1 fL (ref 78.0–100.0)
Platelets: 248 10*3/uL (ref 150–400)
RBC: 3.8 MIL/uL — ABNORMAL LOW (ref 3.87–5.11)

## 2012-08-03 LAB — BASIC METABOLIC PANEL
CO2: 29 mEq/L (ref 19–32)
Calcium: 9 mg/dL (ref 8.4–10.5)
Creatinine, Ser: 0.83 mg/dL (ref 0.50–1.10)
GFR calc non Af Amer: 75 mL/min — ABNORMAL LOW (ref >90)
Sodium: 137 mEq/L (ref 135–145)

## 2012-08-03 LAB — GLUCOSE, CAPILLARY
Glucose-Capillary: 184 mg/dL — ABNORMAL HIGH (ref 70–99)
Glucose-Capillary: 208 mg/dL — ABNORMAL HIGH (ref 70–99)
Glucose-Capillary: 162 mg/dL — ABNORMAL HIGH (ref 70–99)
Glucose-Capillary: 163 mg/dL — ABNORMAL HIGH (ref 70–99)

## 2012-08-03 MED ORDER — HYDROMORPHONE HCL 2 MG PO TABS
ORAL_TABLET | ORAL | Status: AC
  Filled 2012-08-03: qty 2

## 2012-08-03 MED ORDER — HYDROMORPHONE HCL 2 MG PO TABS
2.0000 mg | ORAL_TABLET | ORAL | Status: DC | PRN
  Administered 2012-08-03 – 2012-08-04 (×5): 4 mg via ORAL
  Filled 2012-08-03 (×4): qty 2

## 2012-08-03 MED ORDER — ESTRADIOL 10 MCG VA TABS: 0.5000 | ORAL_TABLET | VAGINAL | Status: DC

## 2012-08-03 NOTE — Progress Notes (Signed)
UR COMPLETED  

## 2012-08-03 NOTE — Op Note (Signed)
[name] [name], [name] [name]              ACCOUNT NO.:  [id number]  MEDICAL RECORD NO.:  [id number]  LOCATION:  [id number]                        FACILITY:  [facility]  PHYSICIAN:  Harvie Junior, M.D.   DATE OF BIRTH:  04-11-1950  DATE OF PROCEDURE:  08/02/2012 DATE OF DISCHARGE:                              OPERATIVE REPORT   PREOPERATIVE DIAGNOSIS:  End-stage degenerative joint disease, left knee.  POSTOPERATIVE DIAGNOSIS:  End-stage degenerative joint disease, left knee.  PROCEDURE:  Left total knee replacement with a Sigma system, size 3 femur, size 3 tibia, 10 mm bridging bearing, and a 35-mm all- polyethylene patella.  SURGEON:  Harvie Junior, M.D.  ASSISTANT:  Marshia Ly, P.A.  ANESTHESIA:  General.  BRIEF HISTORY:  [name] [name] is a 62-year-old female with a long history of having had significant complaints of pain in the left knee.  She was treated conservatively for a period of time with activity modification, injection therapy, weight reduction, and physical therapy.  After failure of all conservative care, she was taken to the operating room for left total knee replacement, still complaining of night pain and light activity pain.  Preoperative x-ray showed bone-on-bone changes with medial compartment.  The patient was taken to the operating room for left total knee replacement.  DESCRIPTION OF PROCEDURE:  The patient was taken to the operating room. After adequate level of anesthesia was obtained with general anesthetic, the patient was placed supine on the operative table.  The left leg was prepped and draped in usual sterile fashion.  Following this, the leg was exsanguinated.  Blood pressure tourniquet was inflated to 300 mmHg. Following this, a midline incision was made, subcutaneous tissue down to the level of extensor mechanism.  A medial parapatellar arthrotomy was undertaken.  Once this was done, the knee was opened and medial and lateral meniscus removed,  retropatellar fat pad, synovium in the anterior aspect of the femur, and anterior and posterior cruciate. Following this, an extramedullary alignment guide was used and the tibia was cut perpendicular to its long axis.  Following this, an intramedullary pilot hole was made in the femur, and this was exploited and the femur was cut perpendicular to the anatomic axis with 5-degree valgus alignment.  Once this was done, the spacer blocks were put in place total tight in extension.  We went back to the tibia and cut 2-1/2 more tibia enough space to block fit easily.  At this point, attention was turned to the tibia, sized to a 3.  Anterior and posterior cuts were made, chamfers and box.  Attention was turned to the tibia which was drilled and keeled and trial 3 and 3 were placed with a 10-mm bridging bearing easy full extension, easy full flexion.  No instability. Attention was turned towards the patella.  The patella was cut down to a level of 13 mm and then, 35 paddle was chosen and lugs were then drilled for the femur.  Trial patella was put in place.  The knee put through a range of motion, was stable and at this point, all trial components were removed.  The knee was copiously and thoroughly lavaged and suctioned dry.  The final components  were then cemented in place, size 3 femur, size 3 tibia, 10-mm bearing trial was placed and a 35 all poly patella was placed and held with a clamp.  The bone cement was allowed to harden.  All excess bone cement was removed.  Following this, the tourniquet was let down and all bleeding was controlled with electrocautery.  At this point, attention was turned towards placement of the final tibial component, tibial polyethylene.  This was placed. Again, the range of motion and stability were checked and they were excellent.  Knee was again irrigated, suctioned dry.  The medium Hemovac drain was placed.  The knee was then closed with 1 Vicryl running on  the medial parapatellar arthrotomy, the skin with 0 and 2-0 Vicryl and 3-0 Monocryl subcuticular.  Benzoin and Steri-Strips applied.  Sterile compressive dressing was applied.  Patient was taken to recovery room and was noted to be in satisfactory condition.  Estimated blood loss during procedure was minimal.  Total tourniquet time was 63 minutes.     Harvie Junior, M.D.     Ranae Plumber  D:  08/02/2012  T:  08/03/2012  Job:  [id number]

## 2012-08-03 NOTE — Evaluation (Signed)
Physical Therapy Evaluation Patient Details Name: [name] [name] MRN: [id number] DOB: [birth year] Today's Date: 08/03/2012 Time: 4098-1191 PT Time Calculation (min): 31 min  PT Assessment / Plan / Recommendation Clinical Impression  Pt is 62 y/o female admitted for s/p left TKA.  Pt very motivated and willing to work hard to go home and improve overall mobility.  Pt will benefit from acute PT services to improve overall mobility and prepare for safe d/c to home with family.    PT Assessment  Patient needs continued PT services    Follow Up Recommendations  Home health PT;Supervision - Intermittent    Barriers to Discharge None      Equipment Recommendations  Rolling walker with 5" wheels (wide RW and wide 3n1)    Recommendations for Other Services OT consult   Frequency 7X/week    Precautions / Restrictions Precautions Precautions: Knee Required Braces or Orthoses: Knee Immobilizer - Left Knee Immobilizer - Left: Discontinue once straight leg raise with < 10 degree lag Restrictions Weight Bearing Restrictions: Yes LLE Weight Bearing: Weight bearing as tolerated   Pertinent Vitals/Pain 5/10 left knee pain      Mobility  Bed Mobility Bed Mobility: Supine to Sit Supine to Sit: 4: Min assist Details for Bed Mobility Assistance: (A) to with LLE OOB and (A) to elevate trunk OOB with cues for technique.  Heavy use of rail to complete bed mobility Transfers Transfers: Sit to Stand;Stand to Sit Sit to Stand: 4: Min assist;With armrests;From bed;From chair/3-in-1 Stand to Sit: 4: Min assist;To chair/3-in-1 Details for Transfer Assistance: (A) to initiate transfer and slowly descend to 3n1/recliner with cues for hand and LE placement Ambulation/Gait Ambulation/Gait Assistance: 4: Min guard Ambulation Distance (Feet): 8 Feet Assistive device: Rolling walker Ambulation/Gait Assistance Details: Minguard for safety with cues for proper step sequence and RW placement Gait  Pattern: Step-to pattern;Decreased step length - right;Decreased stance time - left;Antalgic Gait velocity: decreased Stairs: No    Exercises Total Joint Exercises Ankle Circles/Pumps: AROM;Both;10 reps Quad Sets: AAROM;Left;5 reps Heel Slides: AAROM;Strengthening;Left;5 reps Goniometric ROM: 10-40 degrees AAROM knee flexion   PT Diagnosis: Difficulty walking;Abnormality of gait;Acute pain  PT Problem List: Decreased strength;Decreased range of motion;Decreased activity tolerance;Decreased balance;Decreased mobility;Decreased knowledge of use of DME;Pain;Decreased knowledge of precautions PT Treatment Interventions: DME instruction;Gait training;Stair training;Functional mobility training;Therapeutic activities;Therapeutic exercise;Balance training;Patient/family education   PT Goals Acute Rehab PT Goals PT Goal Formulation: With patient Time For Goal Achievement: 08/10/12 Potential to Achieve Goals: Good Pt will go Supine/Side to Sit: with modified independence PT Goal: Supine/Side to Sit - Progress: Goal set today Pt will go Sit to Supine/Side: with modified independence PT Goal: Sit to Supine/Side - Progress: Goal set today Pt will go Sit to Stand: with modified independence PT Goal: Sit to Stand - Progress: Goal set today Pt will go Stand to Sit: with modified independence PT Goal: Stand to Sit - Progress: Goal set today Pt will Ambulate: >150 feet;with modified independence;with rolling walker PT Goal: Ambulate - Progress: Goal set today Pt will Go Up / Down Stairs: 3-5 stairs;with min assist;with least restrictive assistive device PT Goal: Up/Down Stairs - Progress: Goal set today Pt will Perform Home Exercise Program: Independently PT Goal: Perform Home Exercise Program - Progress: Goal set today  Visit Information  Last PT Received On: 08/03/12 Assistance Needed: +1    Subjective Data  Subjective: "It feels better sitting up." Patient Stated Goal: To go home with  family   Prior Functioning  Home Living  Lives With: Son;Daughter Available Help at Discharge: Family Type of Home: House Home Access: Stairs to enter [employer] of Steps: 33 Entrance Stairs-Rails: Right;Left;Can reach both Home Layout: One level Bathroom Shower/Tub: Walk-in shower;Door [employer] Toilet: Pharmacist, community: Yes How Accessible: Accessible via walker Home Adaptive Equipment: None Prior Function Level of Independence: Independent Able to Take Stairs?: Yes Driving: Yes Communication Communication: No difficulties Dominant Hand: Right    Cognition  Cognition Arousal/Alertness: Awake/alert Behavior During Therapy: WFL for tasks assessed/performed Overall Cognitive Status: Within Functional Limits for tasks assessed    Extremity/Trunk Assessment Right Lower Extremity Assessment RLE ROM/Strength/Tone: Within functional levels Left Lower Extremity Assessment LLE ROM/Strength/Tone: Deficits;Unable to fully assess;Due to pain   Balance    End of Session PT - End of Session Equipment Utilized During Treatment: Gait belt;Left knee immobilizer Activity Tolerance: Patient tolerated treatment well Patient left: in chair;with call bell/phone within reach Nurse Communication: Mobility status CPM Left Knee CPM Left Knee: Off  GP     Treniya Lobb 08/03/2012, 9:26 AM  [name], PT DPT [phone]

## 2012-08-03 NOTE — Progress Notes (Signed)
Physical Therapy Treatment Patient Details Name: [name] [name] MRN: [id number] DOB: Nov 25, 1950 Today's Date: 08/03/2012 Time: 1351-1415 PT Time Calculation (min): 24 min  PT Assessment / Plan / Recommendation Comments on Treatment Session  Pt able to increase ambulation distance and able to perform toliet hygiene with minguard for safey during standing.  Pt continues to make great progress and plan to practice steps tomorrow.  Pt left on CPM 0-40 degrees.    Follow Up Recommendations  Home health PT;Supervision - Intermittent     Equipment Recommendations  Rolling walker with 5" wheels    Recommendations for Other Services OT consult  Frequency 7X/week   Plan Discharge plan remains appropriate;Frequency remains appropriate    Precautions / Restrictions Precautions Precautions: Knee Required Braces or Orthoses: Knee Immobilizer - Left Knee Immobilizer - Left: Discontinue once straight leg raise with < 10 degree lag Restrictions Weight Bearing Restrictions: Yes LLE Weight Bearing: Weight bearing as tolerated   Pertinent Vitals/Pain 7/10 left knee pain    Mobility  Bed Mobility Bed Mobility: Sit to Supine Sit to Supine: 4: Min assist Details for Bed Mobility Assistance: (A) with LLE into bed with cues for proper technique Transfers Transfers: Sit to Stand;Stand to Sit Sit to Stand: 4: Min assist;With armrests;From chair/3-in-1 Stand to Sit: 4: Min assist;To chair/3-in-1 Details for Transfer Assistance: (A) to initiate transfer and slowly descend to 3n1/recliner with cues for hand and LE placement Ambulation/Gait Ambulation/Gait Assistance: 4: Min guard Ambulation Distance (Feet): 80 Feet Assistive device: Rolling walker Ambulation/Gait Assistance Details: Minguard for safety with cues for step sequence and RW placement Gait Pattern: Step-to pattern;Decreased step length - right;Decreased stance time - left;Antalgic Gait velocity: decreased Stairs: No    Exercises      PT Diagnosis:    PT Problem List:   PT Treatment Interventions:     PT Goals Acute Rehab PT Goals PT Goal Formulation: With patient Time For Goal Achievement: 08/10/12 Potential to Achieve Goals: Good Pt will go Supine/Side to Sit: with modified independence PT Goal: Supine/Side to Sit - Progress: Progressing toward goal Pt will go Sit to Supine/Side: with modified independence PT Goal: Sit to Supine/Side - Progress: Progressing toward goal Pt will go Sit to Stand: with modified independence PT Goal: Sit to Stand - Progress: Progressing toward goal Pt will go Stand to Sit: with modified independence PT Goal: Stand to Sit - Progress: Met Pt will Ambulate: >150 feet;with modified independence;with rolling walker PT Goal: Ambulate - Progress: Progressing toward goal  Visit Information  Last PT Received On: 08/10/12 Assistance Needed: +1    Subjective Data  Subjective: "The pain got better after walking." Patient Stated Goal: To go home with family   Cognition  Cognition Arousal/Alertness: Awake/alert Behavior During Therapy: WFL for tasks assessed/performed Overall Cognitive Status: Within Functional Limits for tasks assessed    Balance     End of Session PT - End of Session Equipment Utilized During Treatment: Gait belt;Left knee immobilizer Activity Tolerance: Patient tolerated treatment well Patient left: in bed;in CPM;with call bell/phone within reach Nurse Communication: Mobility status CPM Left Knee CPM Left Knee: On Left Knee Flexion (Degrees): 40 Left Knee Extension (Degrees): 0   GP     [name] [name] 08/03/2012, 3:01 PM [name], PT DPT [phone]

## 2012-08-03 NOTE — Progress Notes (Signed)
   CARE MANAGEMENT NOTE 08/03/2012  Patient:  CLARY, BOULAIS   Account Number:  [id number]  Date Initiated:  08/03/2012  Documentation initiated by:  [facility]  Subjective/Objective Assessment:   TOTAL KNEE ARTHROPLASTY (Left)     Action/Plan:   Anticipated DC Date:  08/04/2012   Anticipated DC Plan:  HOME W HOME HEALTH SERVICES      DC Planning Services  CM consult      [facility] Choice  HOME HEALTH   Choice offered to / List presented to:  C-1 Patient   DME arranged  3-N-1  WALKER - ROLLING  CPM      DME agency  [employer]     HH arranged  HH-2 PT      HH agency  [facility]   Status of service:  Completed, signed off Medicare Important Message given?   (If response is "NO", the following Medicare IM given date fields will be blank) Date Medicare IM given:   Date Additional Medicare IM given:    Discharge Disposition:  HOME W HOME HEALTH SERVICES  Per UR Regulation:    If discussed at Long Length of Stay Meetings, dates discussed:    Comments:  08/03/2012 1530 NCM spoke to pt and she has her DME. [employer] will deliver CPM once she arrives at home. Isidoro Donning RN CCM Case Mgmt phone [phone]

## 2012-08-03 NOTE — Progress Notes (Signed)
Subjective: 1 Day Post-Op Procedure(s) (LRB): TOTAL KNEE ARTHROPLASTY (Left) Patient reports pain as 5 on 0-10 scale.   Up on side of bed. Objective: Vital signs in last 24 hours: Temp:  [97.7 F (36.5 C)-98.5 F (36.9 C)] 98.4 F (36.9 C) (06/10 0515) Pulse Rate:  [63-75] 66 (06/10 0515) Resp:  [12-20] 18 (06/10 0515) BP: (111-196)/(51-85) 111/58 mmHg (06/10 0515) SpO2:  [90 %-100 %] 94 % (06/10 0515)  Intake/Output from previous day: 06/09 0701 - 06/10 0700 In: 2456.7 [P.O.:660; I.V.:1696.7; IV Piggyback:100] Out: 1700 [Urine:1300; Drains:350; Blood:50] Intake/Output this shift:     Recent Labs  08/03/12 0415  HGB 11.4*    Recent Labs  08/03/12 0415  WBC 10.0  RBC 3.80*  HCT 35.0*  PLT 248    Recent Labs  08/03/12 0415  NA 137  K 4.2  CL 101  CO2 29  BUN 13  CREATININE 0.83  GLUCOSE 180*  CALCIUM 9.0  left knee exam:  Neurovascular intact Sensation intact distally Intact pulses distally Dorsiflexion/Plantar flexion intact Incision: dressing C/D/I Compartment soft  Assessment/Plan: 1 Day Post-Op Procedure(s) (LRB): TOTAL KNEE ARTHROPLASTY (Left) Plan: Aspirin 325 mg twice daily with SCDs for the VTE prophylaxis. Up with therapy Discharge home with home health in one to 2 days. Drain pulled Aydyn Testerman G 08/03/2012, 9:52 AM

## 2012-08-04 LAB — GLUCOSE, CAPILLARY: Glucose-Capillary: 152 mg/dL — ABNORMAL HIGH (ref 70–99)

## 2012-08-04 LAB — CBC
MCH: 29.9 pg (ref 26.0–34.0)
MCV: 92.8 fL (ref 78.0–100.0)
Platelets: 210 10*3/uL (ref 150–400)
RDW: 13.2 % (ref 11.5–15.5)
WBC: 10.4 10*3/uL (ref 4.0–10.5)

## 2012-08-04 MED ORDER — ASPIRIN 325 MG PO TBEC: 325.0000 mg | DELAYED_RELEASE_TABLET | Freq: Two times a day (BID) | ORAL | Status: DC

## 2012-08-04 MED ORDER — METHOCARBAMOL 750 MG PO TABS: ORAL_TABLET | ORAL | Status: DC

## 2012-08-04 MED ORDER — HYDROMORPHONE HCL 2 MG PO TABS: 2.0000 mg | ORAL_TABLET | ORAL | Status: DC | PRN

## 2012-08-04 MED ORDER — OXYCODONE-ACETAMINOPHEN 5-325 MG PO TABS: 1.0000 | ORAL_TABLET | Freq: Four times a day (QID) | ORAL | Status: DC | PRN

## 2012-08-04 MED ORDER — MAGNESIUM HYDROXIDE 400 MG/5ML PO SUSP
30.0000 mL | Freq: Every day | ORAL | Status: DC | PRN
  Administered 2012-08-04: 30 mL via ORAL
  Filled 2012-08-04: qty 30

## 2012-08-04 NOTE — Discharge Summary (Signed)
Patient ID: [name] [name] MRN: [id number] DOB/AGE: [birth year] 62 y.o.  Admit date: 08/02/2012 Discharge date: 08/04/2012  Admission Diagnoses:  Principal Problem:   Osteoarthritis of left knee   Discharge Diagnoses:  Same  Past Medical History  Diagnosis Date  . Hyperlipidemia   . GERD (gastroesophageal reflux disease)   . Cancer, uterine 05/2003    F1G01,G1  . Diabetes 2005  . HTN (hypertension) 2005  . Biliary dyskinesia 07/2006  . Urinary, incontinence, stress female 12/13    using Pessary 01/2012  . Gastroparesis 7/08    Dr. Loreta Ave  . Sleep apnea   . Arthritis     Hx: of T/O  . Diverticulosis     Hx: of    Surgeries: Procedure(s):Left TOTAL KNEE ARTHROPLASTY on 08/02/2012   Discharged Condition: Improved  Hospital Course: [name] [name] is an 63 y.o. female who was admitted 08/02/2012 for operative treatment ofOsteoarthritis of left knee. Patient has severe unremitting pain that affects sleep, daily activities, and work/hobbies. After pre-op clearance the patient was taken to the operating room on 08/02/2012 and underwent  Procedure(s):Left TOTAL KNEE ARTHROPLASTY.    Patient was given perioperative antibiotics:     Anti-infectives   Start     Dose/Rate Route Frequency Ordered Stop   08/02/12 1830  ceFAZolin (ANCEF) IVPB 2 g/50 mL premix     2 g 100 mL/hr over 30 Minutes Intravenous Every 6 hours 08/02/12 1639 08/03/12 0048   08/02/12 1325  cefUROXime (ZINACEF) injection  Status:  Discontinued       As needed 08/02/12 1326 08/02/12 1438   08/02/12 0600  ceFAZolin (ANCEF) 3 g in dextrose 5 % 50 mL IVPB     3 g 160 mL/hr over 30 Minutes Intravenous On call to O.R. 08/01/12 1351 08/02/12 1231       Patient was given sequential compression devices, early ambulation, and chemoprophylaxis to prevent DVT.  Patient benefited maximally from hospital stay and there were no complications.    Recent vital signs:  Patient Vitals for the past 24 hrs:  BP Temp Pulse Resp  SpO2  08/04/12 0825 136/60 mmHg - 71 - -  08/04/12 0600 113/48 mmHg 98.5 F (36.9 C) 67 18 96 %  08/03/12 2112 152/53 mmHg 98.7 F (37.1 C) 71 18 97 %  08/03/12 1426 147/62 mmHg - 67 18 93 %     Recent laboratory studies:   Recent Labs  08/03/12 0415 08/04/12 0425  WBC 10.0 10.4  HGB 11.4* 10.3*  HCT 35.0* 32.0*  PLT 248 210  NA 137  --   K 4.2  --   CL 101  --   CO2 29  --   BUN 13  --   CREATININE 0.83  --   GLUCOSE 180*  --   CALCIUM 9.0  --      Discharge Medications:     Medication List    STOP taking these medications       ALEVE 220 MG tablet  Generic drug:  naproxen sodium     aspirin 81 MG tablet     ibuprofen 200 MG tablet  Commonly known as:  ADVIL,MOTRIN      TAKE these medications       acetaminophen 500 MG tablet  Commonly known as:  TYLENOL  Take 1,500 mg by mouth every 6 (six) hours as needed for pain.     aspirin 325 MG EC tablet  Take 1 tablet (325 mg total) by mouth 2 (two)  times daily after a meal.     atorvastatin 40 MG tablet  Commonly known as:  LIPITOR  Take 40 mg by mouth daily.     benazepril 40 MG tablet  Commonly known as:  LOTENSIN  Take 40 mg by mouth daily.     carvedilol 12.5 MG tablet  Commonly known as:  COREG  Take 12.5 mg by mouth 2 (two) times daily with a meal.     ergocalciferol 50000 UNITS capsule  Commonly known as:  VITAMIN D2  Take 50,000 Units by mouth once a week.     escitalopram 10 MG tablet  Commonly known as:  LEXAPRO  Take 5 mg by mouth 3 (three) times a week.     fish oil-omega-3 fatty acids 1000 MG capsule  Take 1 g by mouth daily.     glimepiride 1 MG tablet  Commonly known as:  AMARYL  Take 1 mg by mouth daily before breakfast.     HYDROmorphone 2 MG tablet  Commonly known as:  DILAUDID  Take 1-2 tablets (2-4 mg total) by mouth every 4 (four) hours as needed.     metFORMIN 1000 MG tablet  Commonly known as:  GLUCOPHAGE  Take 1,000 mg by mouth 2 (two) times daily with a meal.      methocarbamol 750 MG tablet  Commonly known as:  ROBAXIN-750  One every 8hrs prn spasm.     omeprazole 20 MG capsule  Commonly known as:  PRILOSEC  Take 20 mg by mouth every morning.     TRILIPIX 135 MG capsule  Generic drug:  Choline Fenofibrate  Take 135 mg by mouth daily.     VAGIFEM 10 MCG Tabs vaginal tablet  Generic drug:  Estradiol  Place 0.5 tablets vaginally 2 (two) times a week. Twice a week - on Sunday and Wednesday        Diagnostic Studies: Dg Chest 2 View  07/30/2012   *RADIOLOGY REPORT*  Clinical Data: Preoperative evaluation for knee replacement.  No current chest complaints  CHEST - 2 VIEW  Comparison: 09/05/2009  Findings: Lung volumes are decreased relative to the prior exam with resultant crowding the bronchovascular markings.  Heart and mediastinal contours are within normal limits and stable.  The lung fields demonstrate mild diffuse increase in interstitial markings compatible with underlying bronchitic change.  This is stable in comparison with prior exam but accentuated due to the low lung volumes.  The lungs are otherwise clear with no signs of focal infiltrate or congestive failure.  No pleural fluid or significant peribronchial cuffing is seen.  Bony structures demonstrate degenerative osteophytosis of the lower thoracic spine.  Surgical clips are seen in the upper abdomen on the lateral view.  IMPRESSION: Low lung volumes with bibasilar volume loss and bronchitic change. No new abnormality seen.   Original Report Authenticated By: Rhodia Albright, M.D.    Disposition:   Discharge Orders   Future Appointments Provider Department Dept Phone   09/06/2012 10:15 AM Lauro Franklin, FNP [facility] [phone]   12/27/2012 9:30 AM Barbaraann Share, MD [facility] [phone]   Future Orders Complete By Expires     CPM  As directed     Comments:      Continuous passive motion machine (CPM):      Use the CPM from 0 degrees  to  60 degrees for 8  hours per day.      You may increase by 10 degrees per day.  You may break it up into 2 or 3 sessions per day.      Use CPM for 1-2 weeks or until you are told to stop.    Diet Carb Modified  As directed     Do not put a pillow under the knee. Place it under the heel.  As directed     Increase activity slowly as tolerated  As directed     Weight bearing as tolerated  As directed        Follow-up Information   Follow up with GRAVES,JOHN L, MD. Schedule an appointment as soon as possible for a visit in 2 weeks.   Contact information:   [address] [place] [address] [phone]        Signed: Matthew Folks 08/04/2012, 1:45 PM

## 2012-08-04 NOTE — Progress Notes (Signed)
Subjective: 2 Days Post-Op Procedure(s) (LRB): TOTAL KNEE ARTHROPLASTY (Left) Patient reports pain as mild.    Objective: Vital signs in last 24 hours: Temp:  [98.5 F (36.9 C)-98.7 F (37.1 C)] 98.5 F (36.9 C) (06/11 0600) Pulse Rate:  [67-71] 67 (06/11 0600) Resp:  [18] 18 (06/11 0600) BP: (113-152)/(48-62) 113/48 mmHg (06/11 0600) SpO2:  [93 %-97 %] 96 % (06/11 0600)  Intake/Output from previous day: 06/10 0701 - 06/11 0700 In: 920 [P.O.:920] Out: 200 [Urine:200] Intake/Output this shift:     Recent Labs  08/03/12 0415 08/04/12 0425  HGB 11.4* 10.3*    Recent Labs  08/03/12 0415 08/04/12 0425  WBC 10.0 10.4  RBC 3.80* 3.45*  HCT 35.0* 32.0*  PLT 248 210    Recent Labs  08/03/12 0415  NA 137  K 4.2  CL 101  CO2 29  BUN 13  CREATININE 0.83  GLUCOSE 180*  CALCIUM 9.0   No results found for this basename: LABPT, INR,  in the last 72 hours  Neurologically intact ABD soft Neurovascular intact Sensation intact distally Intact pulses distally Dorsiflexion/Plantar flexion intact No cellulitis present Compartment soft  Assessment/Plan: 2 Days Post-Op Procedure(s) (LRB): TOTAL KNEE ARTHROPLASTY (Left) Advance diet Up with therapy Discharge home with home health  [name] [name] L 08/04/2012, 10:29 AM

## 2012-08-04 NOTE — Progress Notes (Signed)
Physical Therapy Treatment Patient Details Name: [name] [name] MRN: [id number] DOB: [birth year] Today's Date: 08/04/2012 Time: 0865-7846 PT Time Calculation (min): 24 min  PT Assessment / Plan / Recommendation Comments on Treatment Session  Patient progressing well. Completed stair training and ambulation. Anticipate DC today.     Follow Up Recommendations  Home health PT;Supervision - Intermittent     Does the patient have the potential to tolerate intense rehabilitation     Barriers to Discharge        Equipment Recommendations  Rolling walker with 5" wheels    Recommendations for Other Services    Frequency 7X/week   Plan Discharge plan remains appropriate;Frequency remains appropriate    Precautions / Restrictions Precautions Precautions: Knee Required Braces or Orthoses: Knee Immobilizer - Left Knee Immobilizer - Left: Discontinue once straight leg raise with < 10 degree lag Restrictions LLE Weight Bearing: Weight bearing as tolerated   Pertinent Vitals/Pain     Mobility  Bed Mobility Supine to Sit: 6: Modified independent (Device/Increase time) Transfers Sit to Stand: 5: Supervision Stand to Sit: 5: Supervision Details for Transfer Assistance: Cues for hand placement Ambulation/Gait Ambulation/Gait Assistance: 5: Supervision Ambulation Distance (Feet): 200 Feet Assistive device: Rolling walker Ambulation/Gait Assistance Details: one rest break. Step through patter Gait Pattern: Step-through pattern;Decreased stride length Stairs: Yes Stairs Assistance: 4: Min guard Stair Management Technique: Step to pattern;Forwards;Two rails Number of Stairs: 4    Exercises Total Joint Exercises Quad Sets: Left;10 reps;AROM Heel Slides: Strengthening;Left;10 reps;AROM Hip ABduction/ADduction: AROM;Left;10 reps Straight Leg Raises: AAROM;Left;10 reps Long Arc Quad: Left;10 reps;AAROM   PT Diagnosis:    PT Problem List:   PT Treatment Interventions:     PT  Goals Acute Rehab PT Goals PT Goal: Supine/Side to Sit - Progress: Met PT Goal: Sit to Stand - Progress: Progressing toward goal PT Goal: Stand to Sit - Progress: Progressing toward goal PT Goal: Ambulate - Progress: Progressing toward goal PT Goal: Up/Down Stairs - Progress: Progressing toward goal PT Goal: Perform Home Exercise Program - Progress: Progressing toward goal  Visit Information  Last PT Received On: 08/04/12 Assistance Needed: +1    Subjective Data      Cognition  Cognition Arousal/Alertness: Awake/alert Behavior During Therapy: WFL for tasks assessed/performed Overall Cognitive Status: Within Functional Limits for tasks assessed    Balance     End of Session PT - End of Session Equipment Utilized During Treatment: Gait belt;Left knee immobilizer Activity Tolerance: Patient tolerated treatment well Patient left: in chair;with call bell/phone within reach   GP     Fredrich Birks 08/04/2012, 10:23 AM 08/04/2012 Fredrich Birks PTA [phone] pager [phone] office

## 2012-08-24 ENCOUNTER — Ambulatory Visit: Payer: Medicaid Other | Admitting: Physical Therapy

## 2012-08-30 ENCOUNTER — Encounter: Payer: Self-pay | Admitting: *Deleted

## 2012-08-31 ENCOUNTER — Ambulatory Visit: Payer: Medicaid Other | Attending: Orthopedic Surgery | Admitting: Physical Therapy

## 2012-08-31 DIAGNOSIS — M25569 Pain in unspecified knee: Secondary | ICD-10-CM | POA: Insufficient documentation

## 2012-08-31 DIAGNOSIS — R5381 Other malaise: Secondary | ICD-10-CM | POA: Insufficient documentation

## 2012-08-31 DIAGNOSIS — Z96659 Presence of unspecified artificial knee joint: Secondary | ICD-10-CM | POA: Insufficient documentation

## 2012-08-31 DIAGNOSIS — IMO0001 Reserved for inherently not codable concepts without codable children: Secondary | ICD-10-CM | POA: Insufficient documentation

## 2012-08-31 DIAGNOSIS — M25669 Stiffness of unspecified knee, not elsewhere classified: Secondary | ICD-10-CM | POA: Insufficient documentation

## 2012-09-02 ENCOUNTER — Ambulatory Visit: Payer: Medicaid Other | Admitting: Physical Therapy

## 2012-09-05 ENCOUNTER — Other Ambulatory Visit: Payer: Self-pay | Admitting: Pulmonary Disease

## 2012-09-05 DIAGNOSIS — G4733 Obstructive sleep apnea (adult) (pediatric): Secondary | ICD-10-CM

## 2012-09-06 ENCOUNTER — Ambulatory Visit: Payer: Medicaid Other | Admitting: Nurse Practitioner

## 2012-09-07 ENCOUNTER — Ambulatory Visit: Payer: Medicaid Other | Admitting: Physical Therapy

## 2012-09-10 ENCOUNTER — Encounter: Payer: Medicaid Other | Admitting: Physical Therapy

## 2012-10-04 ENCOUNTER — Ambulatory Visit (INDEPENDENT_AMBULATORY_CARE_PROVIDER_SITE_OTHER): Payer: Medicaid Other | Admitting: Nurse Practitioner

## 2012-10-04 ENCOUNTER — Encounter: Payer: Self-pay | Admitting: Nurse Practitioner

## 2012-10-04 VITALS — BP 144/68 | HR 74 | Resp 14 | Ht 63.5 in | Wt 257.2 lb

## 2012-10-04 DIAGNOSIS — IMO0002 Reserved for concepts with insufficient information to code with codable children: Secondary | ICD-10-CM

## 2012-10-04 DIAGNOSIS — N8111 Cystocele, midline: Secondary | ICD-10-CM

## 2012-10-04 MED ORDER — ESTROGENS, CONJUGATED 0.625 MG/GM VA CREA: TOPICAL_CREAM | Freq: Every day | VAGINAL | Status: DC

## 2012-10-04 NOTE — Progress Notes (Signed)
62 y.o. Legally Separated White female   G3P3000 here for pessary fitting.  Patient has been diagnosed with the following indications warranting pessary use: Cystocele- symptomatic.    She reports these associated symptoms: Vaginal discharge: no Vaginal pressure:  no Urinary symptoms:   none.   Other:  Occasionally will have urinary incontinence with a full bladder and trying to get up out of a chair.  She has been counseled about other options including pelvic physical therapy, surgical intervention, as well as doing nothing.  She had decided to use a pessary after evaluation by a urologist with negative response to anticholingeric med's. Her vaginal vault is very narrow and atrophic so the best fit was a 2" ring with support.  This ws originally placed 02/20/12.  On recheck in January  And April she was still doing OK with Vagifem and use of pessary.   Patient is not sexually active. Urine culture has been performed.  Results:  Negative in past.  Exam: BP 144/68  Pulse 74  Resp 14  Ht 5' 3.5" (1.613 m)  Wt 257 lb 3.2 oz (116.665 kg)  BMI 44.84 kg/m2 General appearance: alert, cooperative, mild distress and left knee replacemnt in June and daughter with her today to help with ambulation and support. Inguinal adenopathy: negative   Pelvic: External genitalia:  no lesions              Urethra: normal appearing urethra with no masses, tenderness or lesions              Bartholin's and Skene's: normal                 Vagina: normal appearing vagina with normal color and discharge, no lesions              Cervix: absent Bimanual Exam:  Uterus:  surgically absent, vaginal cuff well healed                               Adnexa:    normal adnexa in size, non tender and no masses                               Anus:  defer exam  Procedure:  Patient has been fitted with the following pessary and sizes:  2"  ring pessary with support.  Pessary was removed with some difficulty and use of ring  forceps.  No excoriations or irritations.  Assessment:   Mixed Urinary stress & urge incontinence with use of a ring pessary  S/P TAH/ BSO 05/2003 S/P left TKA 07/31/12  Plan: Will plan recheck in 3 months - now that she has less vaginal atrophy - considering trying a larger size pessary to maybe help better with incontinence issues.  She is OK with plan. She will continue her Vagifem 2 times a week.  May consider a larger size of pessary fitting then ordering next OV.  Aftercare summary was provided.

## 2012-10-04 NOTE — Patient Instructions (Signed)
Place cystocele, rectocele, and pessary patient instructions here.   Return in 3 months.  Mayn consider a larger size of pessary.

## 2012-10-06 NOTE — Progress Notes (Signed)
Encounter reviewed by Dr. Ivah Girardot Silva.  

## 2012-10-30 ENCOUNTER — Other Ambulatory Visit: Payer: Self-pay | Admitting: Nurse Practitioner

## 2012-11-01 NOTE — Telephone Encounter (Signed)
RX approved by PG.  RX faxed.  No more refills until AEX.

## 2012-11-01 NOTE — Telephone Encounter (Signed)
Please approve or deny rx. Last aex was 10/13

## 2012-11-24 HISTORY — PX: KNEE ARTHROSCOPY: SHX127

## 2012-12-24 ENCOUNTER — Encounter (HOSPITAL_BASED_OUTPATIENT_CLINIC_OR_DEPARTMENT_OTHER): Payer: Self-pay

## 2012-12-24 ENCOUNTER — Ambulatory Visit (HOSPITAL_BASED_OUTPATIENT_CLINIC_OR_DEPARTMENT_OTHER): Admit: 2012-12-24 | Payer: Medicaid Other | Admitting: Orthopedic Surgery

## 2012-12-24 SURGERY — ARTHROSCOPY, KNEE, WITH MEDIAL MENISCECTOMY
Anesthesia: General | Laterality: Right

## 2012-12-27 ENCOUNTER — Ambulatory Visit (INDEPENDENT_AMBULATORY_CARE_PROVIDER_SITE_OTHER): Payer: Medicaid Other | Admitting: Pulmonary Disease

## 2012-12-27 ENCOUNTER — Encounter: Payer: Self-pay | Admitting: Pulmonary Disease

## 2012-12-27 VITALS — BP 148/88 | HR 60 | Temp 98.1°F | Ht 64.0 in | Wt 256.6 lb

## 2012-12-27 DIAGNOSIS — Z23 Encounter for immunization: Secondary | ICD-10-CM

## 2012-12-27 DIAGNOSIS — G4733 Obstructive sleep apnea (adult) (pediatric): Secondary | ICD-10-CM

## 2012-12-27 NOTE — Patient Instructions (Signed)
Will decrease your pressure to 14cm. Will send an order to your homecare company to get your mask cushions replaced. Keep working on weight loss followup with me in one year if doing well.

## 2012-12-27 NOTE — Assessment & Plan Note (Signed)
The patient is doing very well on CPAP, and has lost almost 10 pounds since last visit.  She feels that her pressure is a little too high, and we'll therefore decrease it to 14 cm.  I've also encouraged her to keep up with her cushion changes on a regular basis.

## 2012-12-27 NOTE — Progress Notes (Signed)
  Subjective:    Patient ID: [name] [name], female    DOB: [birth year], 62 y.o.   MRN: [id number]  HPI The patient comes in today for followup of her obstructive sleep apnea.  She is wearing CPAP compliantly, but feels that her pressure is too high.  She has lost almost 10 pounds since the last visit.  She has not been keeping up with her mask cushions, and has been having issues with leak.  Despite this, she feels that she sleeps very well with the device, and has seen significant improvement in alertness.   Review of Systems  Constitutional: Negative for fever and unexpected weight change.  HENT: Negative for congestion, dental problem, ear pain, nosebleeds, postnasal drip, rhinorrhea, sinus pressure, sneezing, sore throat and trouble swallowing.   Eyes: Negative for redness and itching.  Respiratory: Negative for cough, chest tightness, shortness of breath and wheezing.   Cardiovascular: Negative for palpitations and leg swelling.  Gastrointestinal: Negative for nausea and vomiting.  Genitourinary: Negative for dysuria.  Musculoskeletal: Negative for joint swelling.  Skin: Negative for rash.  Neurological: Negative for headaches.  Hematological: Does not bruise/bleed easily.  Psychiatric/Behavioral: Negative for dysphoric mood. The patient is not nervous/anxious.        Objective:   Physical Exam overweight female in no acute distress Nose without purulent discharge noted No skin breakdown or pressure necrosis from the CPAP mask Lower extremities without edema, cyanosis Alert, does not appear to be sleepy, moves all 4 extremities.       Assessment & Plan:

## 2013-01-03 ENCOUNTER — Other Ambulatory Visit: Payer: Self-pay | Admitting: Nurse Practitioner

## 2013-01-04 ENCOUNTER — Encounter: Payer: Self-pay | Admitting: Nurse Practitioner

## 2013-01-04 ENCOUNTER — Ambulatory Visit (INDEPENDENT_AMBULATORY_CARE_PROVIDER_SITE_OTHER): Payer: Medicaid Other | Admitting: Nurse Practitioner

## 2013-01-04 VITALS — BP 140/78 | HR 68 | Temp 98.7°F | Ht 63.5 in | Wt 257.0 lb

## 2013-01-04 DIAGNOSIS — IMO0002 Reserved for concepts with insufficient information to code with codable children: Secondary | ICD-10-CM

## 2013-01-04 DIAGNOSIS — N8111 Cystocele, midline: Secondary | ICD-10-CM

## 2013-01-04 DIAGNOSIS — N993 Prolapse of vaginal vault after hysterectomy: Secondary | ICD-10-CM

## 2013-01-04 MED ORDER — ESCITALOPRAM OXALATE 10 MG PO TABS: 5.0000 mg | ORAL_TABLET | ORAL | Status: DC

## 2013-01-04 MED ORDER — ESTRADIOL 10 MCG VA TABS: 0.5000 | ORAL_TABLET | VAGINAL | Status: DC

## 2013-01-04 NOTE — Telephone Encounter (Signed)
Patient is coming in for office visit today. Please advise if ok to refill this rx?

## 2013-01-04 NOTE — Patient Instructions (Signed)
  Place cystocele, rectocele, and pessary patient instructions here.    We will order the new pessary and all you when it comes in, should be in 1-2 weeks.

## 2013-01-04 NOTE — Progress Notes (Signed)
62 y.o.  Legally Separated White female G3P3000 here for pessary check.  Patient has been using following pessary style and size:  2" ring pessary with support.  She is not sexually active.  She describes the following issues with the pessary: still leaking around pessary. Has noted recently that pessary is not working as well.  She went from using super pas for incontinence to small pas.  Now feels like it is not there and not helping with incontinence.  She wonders if there is something else that needs to be done.  ROS:    no side effects of hormonal medications, no dysuria, trouble voiding or hematuria  Exam:   BP 140/78  Pulse 68  Temp(Src) 98.7 F (37.1 C) (Oral)  Ht 5' 3.5" (1.613 m)  Wt 257 lb (116.574 kg)  BMI 44.81 kg/m2 General appearance: alert, cooperative and appears stated age Inguinal adenopathy: negative   Pelvic: External genitalia:  no lesions              Urethra: normal appearing urethra with no masses, tenderness or lesions              Bartholin's and Skene's: normal                 Vagina: normal appearing vagina with normal color and discharge, no lesions.  The 2 " pessary is removed with use of forceps.  She then is fitted with a size 3 " pessary and still had some relaxation around the pessary.  She then was fitted with a size 4 " pessary which was a little snug.  She was then allowed to walk around and then go void.  She states her volume of urine output was much better and felt like it was a great size for her.  There were no excoriations in the vagina.              Cervix: absent Bimanual Exam:  Uterus:  surgically absent, vaginal cuff well healed                               Adnexa:    not indicated                               Anus:  defer exam  Pessary was removed with difficulty.  Pessary was cleansed. Other sizes of pessaries were tried.  Pessary was replaced. Patient tolerated procedure well.  A larger size 4" pessary was ordered for her and will be  replaced when it comes in.  A:  SUI, Cystocele- symptomatic       Use of pessary that needed to be changed  P:   Return to office in 1 - 2 weeks for recheck and placement of new pessary.       She also ask for a refill on Lexapro.  Her AEX is due end of year.  Most likely will have her return for 2 months after new pessary and do AEX at same time.  Refill on Lexapro is given.    An After Visit Summary was printed and given to the patient.

## 2013-01-05 NOTE — Progress Notes (Signed)
Encounter reviewed by Dr. Brook Silva.  

## 2013-01-11 ENCOUNTER — Telehealth: Payer: Self-pay | Admitting: Pulmonary Disease

## 2013-01-11 NOTE — Telephone Encounter (Signed)
I spoke with pt daughter. Was advised still have not been contacted by [facility]. I show our records indicate this was faxed to them on 12/27/12. I spoke with sharon from [facility] and she reports they have not received anything on this pt. Please advise PCC's thanks

## 2013-01-12 NOTE — Telephone Encounter (Signed)
Order printed and refaxed to [facility] Tobe Sos

## 2013-01-17 ENCOUNTER — Encounter: Payer: Self-pay | Admitting: Nurse Practitioner

## 2013-01-17 ENCOUNTER — Ambulatory Visit (INDEPENDENT_AMBULATORY_CARE_PROVIDER_SITE_OTHER): Payer: Medicaid Other | Admitting: Nurse Practitioner

## 2013-01-17 VITALS — BP 130/76 | HR 68 | Ht 63.5 in | Wt 255.0 lb

## 2013-01-17 DIAGNOSIS — N993 Prolapse of vaginal vault after hysterectomy: Secondary | ICD-10-CM

## 2013-01-17 NOTE — Progress Notes (Signed)
62 y.o. Legally Separated White female G3P3000 here for pessary check.  Patient has been using following pessary style and size:  Size # 2" Milex ring pessary with support. She is not sexually active.  She describes the following issues with the pessary:  Now feels pessary is too small and not able to help control urinary urgency.  Having to wear extra long pad especially at night.  ROS:  no side effects of hormonal medications, no vaginal bleeding, no discharge or pelvic pain, no dysuria, trouble voiding or hematuria  Exam:   BP 130/76  Pulse 68  Ht 5' 3.5" (1.613 m)  Wt 255 lb (115.667 kg)  BMI 44.46 kg/m2 General appearance: alert, cooperative, appears stated age and no distress Inguinal adenopathy: none    Pelvic: External genitalia:  no lesions              Urethra: normal appearing urethra with no masses, tenderness or lesions              Bartholin's and Skene's: normal                 Vagina: normal appearing vagina with normal color and discharge, no lesions              Cervix: absent Bimanual Exam:  Uterus:  uterus is absent                               Adnexa:    not indicated                               Anus:  defer exam  Pessary was removed with difficulty using a ring forceps. New pessary was cleansed with betadine and then inserted without pain or discomfort.  Good fit with size # 4 Premier ring with support.  Pessary was replaced. Patient tolerated procedure well. After getting up off the table she did note no urinary leakage!   A:  SUI   Urge incontinence   Cystocele- symptomatic  Pessary for treatment of these        P:   Return to office in 1 day  for recheck if unable to void or have a BM.  Otherwise she will keep her 3 months recheck.  She will call if any problems with the pessary and will continue to use Premarin vaginal cream as directed.         An After Visit Summary was printed and given to the patient.

## 2013-01-17 NOTE — Patient Instructions (Signed)
If any problems with voiding or BM's to call back.  Will recheck in 1 day.

## 2013-01-18 ENCOUNTER — Ambulatory Visit: Payer: Self-pay | Admitting: Nurse Practitioner

## 2013-01-18 ENCOUNTER — Telehealth: Payer: Self-pay | Admitting: Nurse Practitioner

## 2013-01-18 NOTE — Telephone Encounter (Signed)
Patient called and cancelled her appointment for today with Ria Comment due to not needing the appointment for a pessary recheck. Patient states, "The pessary is feeling fine!"

## 2013-01-19 NOTE — Progress Notes (Signed)
Encounter reviewed by Dr. Brook Silva.  

## 2013-02-09 ENCOUNTER — Telehealth: Payer: Self-pay | Admitting: Pulmonary Disease

## 2013-02-09 NOTE — Telephone Encounter (Signed)
Called, spoke with pt's daughter, [name] [name] -  Order was sent to [facility] on 12/27/12 to decrease pt's cpap pressure and provide her with supplies.  It has to be resent per phone msg from 01/11/13 as [facility] did not receive it.  Pt had not received anything, so the end of November reports they were advised by [facility] that [facility] was awaiting a form from to be signed by [facility] but would go ahead and send pt her supplies.  They still haven't received this and was advised by [facility] today that it would be another 3 wks from now because it would be this long before [facility] would send over any forms.  She isn't happy with the service they are receiving and is upset that pt hasn't received supplies yet.  Also, [name] [name] is unsure if pressure has been changed yet.  Called, spoke with [name] [name] with [facility].  Was advised pt's pressure was decreased to 14 on 01/12/13.  Per [name] [name], they did fax over a new CMN on 01/24/13 for cpap supplies.  The current CMN expired on July 2014.  [name] [name] reports they will go ahead and call pt to set up a delivery for cpap supplies with pt prior to receiving updated CMN.  However, they will need this CMN in order to be in compliance.    I spoke with [name] [name].  We do have the CMN for this.  [name] has placed in [facility]'s green folder for signature.  Dr. Shelle Iron, will you please sign this soon?  Thank you.  Daughter is aware of above per [facility].  She is aware we will have [facility] sign form and send back to [facility].  She is to call back if they do not hear anything from [facility] to set up delivery.

## 2013-02-09 NOTE — Telephone Encounter (Signed)
The easy way to solve this is to move her to another dme.  Also, let [facility] know they will not get business from [name] if they hold our pts hostage with respect to their supplies because some piece of paper is not signed immediately.

## 2013-02-09 NOTE — Telephone Encounter (Signed)
PCC's please advise what we are needing to do? thanks

## 2013-02-10 NOTE — Telephone Encounter (Signed)
Spoke with [facility] [name] [name] in ref to this, she assured me they are going to set supplies tomorrow prior to receiving signed cmn and this was relayed to patient. (it is documented in 02/09/13 note.). And patients pressure was changed.  I informed [name] [name] Dr Shelle Iron next signing is 1st weekend in Jan and cmn WILL BE signed at that timeto stay in compliance. CMN was sent 01/24/13 and never received by our office for Dr Shelle Iron signing weekend of Dec 6th). She has refaxed and in Dr Shelle Iron folder for signature.  I have spoke with patient daughter [name] [name] and explained the same, she understands, and I also told her [facility] will call her today, and supplies will be delivered tomorrow.

## 2013-03-21 ENCOUNTER — Other Ambulatory Visit: Payer: Self-pay | Admitting: Nurse Practitioner

## 2013-03-22 NOTE — Telephone Encounter (Signed)
Last AEX 11/2011 Last refill 10/30/2012 #30/0 refills Next AEX 04/05/2013.  Please advise.

## 2013-03-22 NOTE — Telephone Encounter (Signed)
Refused pt needs to get from PCP and can discuss at AEX

## 2013-03-25 ENCOUNTER — Other Ambulatory Visit: Payer: Self-pay | Admitting: Nurse Practitioner

## 2013-04-05 ENCOUNTER — Ambulatory Visit: Payer: Medicaid Other | Admitting: Nurse Practitioner

## 2013-04-11 ENCOUNTER — Other Ambulatory Visit: Payer: Self-pay | Admitting: Nurse Practitioner

## 2013-04-12 NOTE — Telephone Encounter (Signed)
Last AEX 12/25/2011 Lasr refill 03/21/2013 #30/0 refills Next appt 04/25/13

## 2013-04-25 ENCOUNTER — Encounter: Payer: Self-pay | Admitting: Nurse Practitioner

## 2013-04-25 ENCOUNTER — Ambulatory Visit (INDEPENDENT_AMBULATORY_CARE_PROVIDER_SITE_OTHER): Payer: Medicaid Other | Admitting: Nurse Practitioner

## 2013-04-25 DIAGNOSIS — E559 Vitamin D deficiency, unspecified: Secondary | ICD-10-CM

## 2013-04-25 DIAGNOSIS — Z0289 Encounter for other administrative examinations: Secondary | ICD-10-CM

## 2013-04-25 DIAGNOSIS — C541 Malignant neoplasm of endometrium: Secondary | ICD-10-CM

## 2013-04-25 DIAGNOSIS — C549 Malignant neoplasm of corpus uteri, unspecified: Secondary | ICD-10-CM

## 2013-04-25 DIAGNOSIS — Z960 Presence of urogenital implants: Secondary | ICD-10-CM

## 2013-04-25 DIAGNOSIS — Z01419 Encounter for gynecological examination (general) (routine) without abnormal findings: Secondary | ICD-10-CM

## 2013-04-25 DIAGNOSIS — N993 Prolapse of vaginal vault after hysterectomy: Secondary | ICD-10-CM

## 2013-04-25 DIAGNOSIS — Z9289 Personal history of other medical treatment: Secondary | ICD-10-CM | POA: Insufficient documentation

## 2013-04-25 DIAGNOSIS — Z9889 Other specified postprocedural states: Secondary | ICD-10-CM

## 2013-04-25 MED ORDER — ESTRADIOL 10 MCG VA TABS: 0.5000 | ORAL_TABLET | VAGINAL | Status: DC

## 2013-04-25 MED ORDER — ERGOCALCIFEROL 1.25 MG (50000 UT) PO CAPS: 50000.0000 [IU] | ORAL_CAPSULE | ORAL | Status: DC

## 2013-04-25 NOTE — Progress Notes (Signed)
Patient ID: [name] [name], female   DOB: November 20, 1950, 63 y.o.   MRN: [id number] 63 y.o. G3P3000 Legally Separated Caucasian Fe here for annual exam. Doing well.  She and husband still have a good relationship but just not living together. She is not SA.  Still has some urine leakage with use of # 4 pessary ring with support.  Mostly symptoms occur with sneezing or change of positions. She does like this pessary better than the last one at 2".  No LMP recorded. Patient is postmenopausal.          Sexually active: no  The current method of family planning is none.    Exercising: yes  Home exercise routine includes walking 3-4 times per week. Smoker:  no  Health Maintenance: Pap:  12/25/11, WNL, neg HR HPV MMG:  06/01/12, Bi-Rads 1: negative Colonoscopy:  10/26/09, polyp, repeat 5 years BMD:  03/2010, 1.0/-0.2/2.4 TDaP:  PCP, pt is UTD Shingles has not had Labs: Nov 2014   reports that she has never smoked. She has never used smokeless tobacco. She reports that she does not drink alcohol or use illicit drugs.  Past Medical History  Diagnosis Date  . Hyperlipidemia   . GERD (gastroesophageal reflux disease)   . Cancer, uterine 05/2003    F1G01,G1  . Diabetes 2005  . HTN (hypertension) 2005  . Biliary dyskinesia 07/2006  . Urinary, incontinence, stress female 12/13    using Pessary 01/2012  . Gastroparesis 7/08    Dr. Collene Mares  . Sleep apnea   . Arthritis     Hx: of T/O  . Diverticulosis     Hx: of    Past Surgical History  Procedure Laterality Date  . Tubal ligation    . Knee arthroscopy  2010    left  . Total abdominal hysterectomy w/ bilateral salpingoophorectomy  05/2003    endometrial carcinoma F1G01, G1  . Cholecystectomy, laparoscopic  10/2006  . Colonoscopy w/ biopsies  10/04    polyp  . Colonoscopy w/ biopsies  02/12/05    benign polyp  . Colonoscopy with esophagogastroduodenoscopy (egd)  09/14/06    2 polyps, diverticula, diffuse gastritis  . Colonoscopy w/ biopsies   10/26/09    1 polyp benign, recheck 5 years  . Abdominal hysterectomy    . Total knee arthroplasty Left 08/02/2012    Procedure: TOTAL KNEE ARTHROPLASTY;  Surgeon: Alta Corning, MD;  Location: [facility];  Service: Orthopedics;  Laterality: Left;  left total knee arthroplasty  . Knee arthroscopy Right 11/2012    Current Outpatient Prescriptions  Medication Sig Dispense Refill  . aspirin EC 81 MG tablet Take 81 mg by mouth daily.      Marland Kitchen atorvastatin (LIPITOR) 40 MG tablet Take 40 mg by mouth daily.      . benazepril (LOTENSIN) 40 MG tablet Take 40 mg by mouth daily.      . carvedilol (COREG) 12.5 MG tablet Take 12.5 mg by mouth 2 (two) times daily with a meal.      . ergocalciferol (VITAMIN D2) 50000 UNITS capsule Take 1 capsule (50,000 Units total) by mouth once a week.  30 capsule  3  . escitalopram (LEXAPRO) 20 MG tablet Take 10 mg by mouth daily.      . Estradiol (VAGIFEM) 10 MCG TABS vaginal tablet Place 0.5 tablets (5 mcg total) vaginally 2 (two) times a week. Twice a week - on Sunday and Wednesday  24 tablet  3  . fish oil-omega-3 fatty  acids 1000 MG capsule Take 1 g by mouth daily.      Marland Kitchen gemfibrozil (LOPID) 600 MG tablet Take 600 mg by mouth 2 (two) times daily before a meal.      . LORazepam (ATIVAN) 0.5 MG tablet TAKE 1/2- 1 TABLET BY MOUTH EVERY NIGHT AT BEDTIME AS NEEDED  30 tablet  0  . meloxicam (MOBIC) 15 MG tablet Take 15 mg by mouth daily.      . metFORMIN (GLUCOPHAGE) 1000 MG tablet Take 1,000 mg by mouth 2 (two) times daily with a meal.      . methocarbamol (ROBAXIN-750) 750 MG tablet One every 8hrs prn spasm.  50 tablet  0  . omeprazole (PRILOSEC) 20 MG capsule Take 20 mg by mouth every morning.       No current facility-administered medications for this visit.    Family History  Problem Relation Age of Onset  . Heart disease Father 18    CABG  . Lung cancer Father 62    lung  . Hypertension Father   . Diabetes Father   . Heart disease Sister 47    CABG  . Diabetes  Sister 27  . Heart disease Brother 15    MI with stents  . Pancreatic cancer Other     x 3 - 2 MAunts and 1 P Aunts.   . Lupus Other     Pat Aunt  . Hypertension Mother   . Diabetes Mother   . Hypertension Sister     x2   . Diabetes Sister   . Hypertension Brother   . Diabetes Brother     ROS:  Pertinent items are noted in HPI.  Otherwise, a comprehensive ROS was negative.  Exam:   There were no vitals taken for this visit.    Ht Readings from Last 3 Encounters:  01/17/13 5' 3.5" (1.613 m)  01/04/13 5' 3.5" (1.613 m)  12/27/12 $RemoveB'5\' 4"'[id number]$  (1.626 m)    General appearance: alert, cooperative and appears stated age Head: Normocephalic, without obvious abnormality, atraumatic Neck: no adenopathy, supple, symmetrical, trachea midline and thyroid normal to inspection and palpation Lungs: clear to auscultation bilaterally Breasts: normal appearance, no masses or tenderness Heart: regular rate and rhythm Abdomen: soft, non-tender; no masses,  no organomegaly Extremities: extremities normal, atraumatic, no cyanosis or edema Skin: Skin color, texture, turgor normal. No rashes or lesions Lymph nodes: Cervical, supraclavicular, and axillary nodes normal. No abnormal inguinal nodes palpated Neurologic: Grossly normal   Pelvic: External genitalia:  no lesions              Urethra:  normal appearing urethra with no masses, tenderness or lesions              Bartholin's and Skene's: normal                 Vagina: normal appearing vagina with normal color and discharge, no lesions  With pessary kit tried to resize with a # 5 ring pessary but it was too large. Replaced her pessary after it was cleaned.              Cervix: absent              Pap taken: no Bimanual Exam:  Uterus:  uterus absent              Adnexa: no mass, fullness, tenderness               Rectovaginal: Confirms  Anus:  normal sphincter tone, no lesions  A:  Well Woman with normal exam  Postmenopausal  S/P  TAH/ BSO secondary to endometrial cancer grade 1 05/2003  Urinary incontinence with use of size #4 pessary  Vit D deficiency  History of HTN, Diabetes, OA  P:   Pap smear as per guidelines Not done  Mammogram due 4/15  Refill on Vagifem twice weekly to use with her pessary, plans are to recheck in 3 months  Counseled with risks of DVT, CVA, cancer, etc  She will get Shingles vaccine at PCP  Refill Vit D and follow with labs  Counseled on breast self exam, mammography screening, use and side effects of HRT, adequate intake of calcium and vitamin D, diet and exercise, Kegel's exercises return annually or prn  An After Visit Summary was printed and given to the patient.

## 2013-04-25 NOTE — Patient Instructions (Addendum)

## 2013-04-26 LAB — VITAMIN D 25 HYDROXY (VIT D DEFICIENCY, FRACTURES): Vit D, 25-Hydroxy: 37 ng/mL (ref 30–89)

## 2013-04-27 NOTE — Progress Notes (Signed)
Encounter reviewed by Dr. Brook Silva.  

## 2013-06-08 ENCOUNTER — Other Ambulatory Visit: Payer: Self-pay | Admitting: Nurse Practitioner

## 2013-06-09 NOTE — Telephone Encounter (Signed)
Last AEX 04/25/13 Last refill 04/11/13 #30/0 refills Next appt 06/27/13   Please approve or deny Rx.

## 2013-06-27 ENCOUNTER — Ambulatory Visit: Payer: Medicaid Other | Admitting: Nurse Practitioner

## 2013-06-28 ENCOUNTER — Encounter: Payer: Self-pay | Admitting: Nurse Practitioner

## 2013-06-28 ENCOUNTER — Ambulatory Visit (INDEPENDENT_AMBULATORY_CARE_PROVIDER_SITE_OTHER): Payer: Medicaid Other | Admitting: Nurse Practitioner

## 2013-06-28 VITALS — BP 140/74 | HR 64 | Ht 63.5 in | Wt 262.0 lb

## 2013-06-28 DIAGNOSIS — N8111 Cystocele, midline: Secondary | ICD-10-CM

## 2013-06-28 DIAGNOSIS — IMO0002 Reserved for concepts with insufficient information to code with codable children: Secondary | ICD-10-CM

## 2013-06-28 NOTE — Progress Notes (Signed)
Subjective:   63 y.o. Legally Separated White female G3P3000 here for pessary check.  Patient has been using following pessary style and size: # 4 ring pessary with support.  This one is new and inserted 01/17/13.   She describes the following issues with the pessary:  No recent problems. The pessary size seems to be working better for her with less stress incontinence.   Use of protective clothing such as Depends or pads no. Problems with protective clothing with rash no.  Constipation issues with use of pessary no.  She is not sexually active.   Son is taking her and her sister on a trio out [place] going the [place] route and coming back the [place] route.  ROS:   no breast pain or new or enlarging lumps on self exam,  no abnormal bleeding, pelvic pain or discharge,   no dysuria, trouble voiding or hematuria. Compliant to use of vaginal cream Yes.   General Exam:    BP 140/74  Pulse 64  Ht 5' 3.5" (1.613 m)  Wt 262 lb (118.842 kg)  BMI 45.68 kg/m2  General appearance: alert, cooperative and appears stated age   Pelvic: External genitalia:  no lesions   Before pessary was removed no prolapse over the pessary   In correct position yes              Urethra: normal appearing urethra with no masses, tenderness or lesions              Vagina: normal appearing vagina with normal color and discharge, no lesions.  There are No abrasions or ulcerations.               Cervix: absent Cervical lesions were not found   Bimanual Exam:  Uterus:  uterus absent"uterus is normal size, shape, consistency and non tender                               Adnexa:    not indicated                             Pessary was removed with difficulty with using forceps.  Pessary was cleansed with Betadine.  Pessary was replaced. Patient tolerated procedure well.    Assessment :  SUI, Urge incontinence, Cystocele- symptomatic       S/P TAH/BSO 05/2003 for endo carcinoma   Use of pessary continued   Plan:    Return  to office in 3 months for recheck.         Earlier if any symptoms.    An After Visit Summary was printed and given to the patient.

## 2013-06-28 NOTE — Patient Instructions (Signed)
Recheck in 3 months.

## 2013-07-03 DIAGNOSIS — IMO0002 Reserved for concepts with insufficient information to code with codable children: Secondary | ICD-10-CM | POA: Insufficient documentation

## 2013-07-03 NOTE — Progress Notes (Signed)
Encounter reviewed by Dr. Anguel Delapena Silva.  

## 2013-08-15 ENCOUNTER — Other Ambulatory Visit: Payer: Self-pay | Admitting: Orthopedic Surgery

## 2013-08-23 ENCOUNTER — Other Ambulatory Visit (HOSPITAL_COMMUNITY): Payer: Self-pay | Admitting: *Deleted

## 2013-08-23 NOTE — Pre-Procedure Instructions (Addendum)
[name] [name]  08/23/2013   Your procedure is scheduled on:  Friday, September 02, 2013 at 10:45 AM.   Report to [facility] Entrance "A" Admitting Office at 8:45 AM.   Call this number if you have problems the morning of surgery: [phone]   Remember:   Do not eat food or drink liquids after midnight Thursday, 09/01/13.   Take these medicines the morning of surgery with A SIP OF WATER: carvedilol, lexapro, prilosec        Take all meds as ordered until day of surgery except as instructed below or per dr        Bridgette Habermann all herbel meds, nsaids (aleve,naproxen,advil,ibuprofen) 5 days prior to surgery(08/26/13) including aspirin, vitamins, fish oil, meloxicam   NO DIABETIC MED AM OF SURGERY  .   Do not wear jewelry, make-up or nail polish.  Do not wear lotions, powders, or perfumes. You may wear deodorant.  Do not shave 48 hours prior to surgery.   Do not bring valuables to the hospital.  [facility] is not responsible                  for any belongings or valuables.               Contacts, dentures or bridgework may not be worn into surgery.  Leave suitcase in the car. After surgery it may be brought to your room.  For patients admitted to the hospital, discharge time is determined by your                treatment team.              Special Instructions: [place] - Preparing for Surgery  Before surgery, you can play an important role.  Because skin is not sterile, your skin needs to be as free of germs as possible.  You can reduce the number of germs on you skin by washing with CHG (chlorahexidine gluconate) soap before surgery.  CHG is an antiseptic cleaner which kills germs and bonds with the skin to continue killing germs even after washing.  Please DO NOT use if you have an allergy to CHG or antibacterial soaps.  If your skin becomes reddened/irritated stop using the CHG and inform your nurse when you arrive at Short Stay.  Do not shave (including legs and underarms) for  at least 48 hours prior to the first CHG shower.  You may shave your face.  Please follow these instructions carefully:   1.  Shower with CHG Soap the night before surgery and the                                morning of Surgery.  2.  If you choose to wash your hair, wash your hair first as usual with your       normal shampoo.  3.  After you shampoo, rinse your hair and body thoroughly to remove the                      Shampoo.  4.  Use CHG as you would any other liquid soap.  You can apply chg directly       to the skin and wash gently with scrungie or a clean washcloth.  5.  Apply the CHG Soap to your body ONLY FROM THE NECK DOWN.        Do  not use on open wounds or open sores.  Avoid contact with your eyes, ears, mouth and genitals (private parts).  Wash genitals (private parts) with your normal soap.  6.  Wash thoroughly, paying special attention to the area where your surgery        will be performed.  7.  Thoroughly rinse your body with warm water from the neck down.  8.  DO NOT shower/wash with your normal soap after using and rinsing off       the CHG Soap.  9.  Pat yourself dry with a clean towel.            10.  Wear clean pajamas.            11.  Place clean sheets on your bed the night of your first shower and do not        sleep with pets.  Day of Surgery  Do not apply any lotions the morning of surgery.  Please wear clean clothes to the hospital/surgery center.     Please read over the following fact sheets that you were given: Pain Booklet, Coughing and Deep Breathing, Blood Transfusion Information, MRSA Information and Surgical Site Infection Prevention

## 2013-08-24 ENCOUNTER — Encounter (HOSPITAL_COMMUNITY)
Admission: RE | Admit: 2013-08-24 | Discharge: 2013-08-24 | Disposition: A | Discharge status: Home / Self Care | Payer: Medicaid Other | Source: Ambulatory Visit | Attending: Orthopedic Surgery | Admitting: Orthopedic Surgery

## 2013-08-24 ENCOUNTER — Encounter (HOSPITAL_COMMUNITY): Payer: Self-pay

## 2013-08-24 DIAGNOSIS — Z01818 Encounter for other preprocedural examination: Secondary | ICD-10-CM | POA: Insufficient documentation

## 2013-08-24 DIAGNOSIS — Z01812 Encounter for preprocedural laboratory examination: Secondary | ICD-10-CM | POA: Insufficient documentation

## 2013-08-24 DIAGNOSIS — Z01810 Encounter for preprocedural cardiovascular examination: Secondary | ICD-10-CM | POA: Insufficient documentation

## 2013-08-24 HISTORY — DX: Depression, unspecified: F32.A

## 2013-08-24 HISTORY — DX: Major depressive disorder, single episode, unspecified: F32.9

## 2013-08-24 LAB — BASIC METABOLIC PANEL
Anion gap: 17 — ABNORMAL HIGH (ref 5–15)
BUN: 18 mg/dL (ref 6–23)
CALCIUM: 9.2 mg/dL (ref 8.4–10.5)
CO2: 24 mEq/L (ref 19–32)
Chloride: 99 mEq/L (ref 96–112)
Creatinine, Ser: 0.62 mg/dL (ref 0.50–1.10)
GFR calc Af Amer: >90 mL/min (ref >90)
GLUCOSE: 224 mg/dL — AB (ref 70–99)
POTASSIUM: 4.4 meq/L (ref 3.7–5.3)
SODIUM: 140 meq/L (ref 137–147)

## 2013-08-24 LAB — CBC
HCT: 39.9 % (ref 36.0–46.0)
Hemoglobin: 12.8 g/dL (ref 12.0–15.0)
MCH: 29.9 pg (ref 26.0–34.0)
MCHC: 32.1 g/dL (ref 30.0–36.0)
MCV: 93.2 fL (ref 78.0–100.0)
Platelets: 206 10*3/uL (ref 150–400)
RBC: 4.28 MIL/uL (ref 3.87–5.11)
RDW: 12.6 % (ref 11.5–15.5)
WBC: 4.6 10*3/uL (ref 4.0–10.5)

## 2013-08-24 LAB — SURGICAL PCR SCREEN
MRSA, PCR: NEGATIVE
Staphylococcus aureus: NEGATIVE

## 2013-08-24 LAB — TYPE AND SCREEN
ABO/RH(D): A POS
Antibody Screen: NEGATIVE

## 2013-08-24 LAB — APTT: aPTT: 26 seconds (ref 24–37)

## 2013-08-24 LAB — PROTIME-INR
INR: 1.05 (ref 0.00–1.49)
PROTHROMBIN TIME: 13.7 s (ref 11.6–15.2)

## 2013-08-25 NOTE — Progress Notes (Signed)
Anesthesiology Chart Review: Patient is a 63 year old female scheduled for right THA, anterior approach on 09/02/13 by Dr. Berenice Primas.  History includes non-smoker, DM2, OSA with CPAP use (Dr. Gwenette Greet), uterine cancer s/p hysterectomy, HTN, GERD, depression, gastroparesis, diverticulosis, cholecystectomy, left TKA 07/2012. BMI is consistent with morbid obesity. PCP is listed as Dr. Juanita Craver. She was evaluated by cardiologist Dr. Einar Gip in the past and had a non-ischemic stress test in 2012.    EKG on 08/24/13 showed: SB @ 59 bpm, inferior infarct (age undetermined), cannot rule out anterior infarct (age undetermined). I reviewed multiple prior EKGs.  Poor r wave progression, possible anterior infarct was noted on EKG from 04/22/12 (Dr. Irven Shelling office). Inferior infarct changes have been intermittent, but were noted on prior EKGs from 05/15/08 [facility]). She denied any chest pain at her PAT visit according to her PAT RN.    Echo from 04/24/10 showing mild concentric LVH, normal LV systolic function, EF 78%, mild LAE, trace PR/TR. Nuclear stress test on 03/22/10 showed: Normal myocardial perfusion imaging study without evidence of ischemia or scar and normal LV EF. (Results under Media tab, Correspondence 08/02/12.)  CXR on 08/24/13 showed: No active cardiopulmonary disease.   Preoperative labs noted. UA was not ordered by surgeon.   She has an abnormal EKG, but with similar findings on prior EKGs as well.  She had a non-ischemic stress test in 2012 and tolerated TKA last year. She did not report any new CV symptoms at her PAT visit.  She will be evaluated by her assigned anesthesiologist on the day of surgery, but if no acute cardiopulmonary symptoms then I would anticipate that she could proceed as planned.   George Hugh [address] [facility] Short Stay Center/Anesthesiology Phone [phone] 08/25/2013

## 2013-09-01 MED ORDER — CEFAZOLIN SODIUM-DEXTROSE 2-3 GM-% IV SOLR
2.0000 g | INTRAVENOUS | Status: AC
  Administered 2013-09-02: 2 g via INTRAVENOUS
  Filled 2013-09-01: qty 50

## 2013-09-01 MED ORDER — CHLORHEXIDINE GLUCONATE 4 % EX LIQD
60.0000 mL | Freq: Once | CUTANEOUS | Status: DC
  Filled 2013-09-01: qty 60

## 2013-09-02 ENCOUNTER — Inpatient Hospital Stay (HOSPITAL_COMMUNITY): Payer: Medicaid Other

## 2013-09-02 ENCOUNTER — Inpatient Hospital Stay (HOSPITAL_COMMUNITY)
Admission: RE | Admit: 2013-09-02 | Discharge: 2013-09-03 | DRG: 470 | Disposition: A | Discharge status: Home Health | Payer: Medicaid Other | Source: Ambulatory Visit | Attending: Orthopedic Surgery | Admitting: Orthopedic Surgery

## 2013-09-02 ENCOUNTER — Ambulatory Visit (HOSPITAL_COMMUNITY): Payer: Medicaid Other | Admitting: Anesthesiology

## 2013-09-02 ENCOUNTER — Encounter (HOSPITAL_COMMUNITY)
Admission: RE | Disposition: A | Discharge status: Home Health | Payer: Self-pay | Source: Ambulatory Visit | Attending: Orthopedic Surgery

## 2013-09-02 ENCOUNTER — Ambulatory Visit (HOSPITAL_COMMUNITY): Payer: Medicaid Other

## 2013-09-02 ENCOUNTER — Encounter (HOSPITAL_COMMUNITY): Payer: Medicaid Other | Admitting: Vascular Surgery

## 2013-09-02 ENCOUNTER — Encounter (HOSPITAL_COMMUNITY): Payer: Self-pay | Admitting: Anesthesiology

## 2013-09-02 ENCOUNTER — Telehealth: Payer: Self-pay | Admitting: Pulmonary Disease

## 2013-09-02 DIAGNOSIS — Z96659 Presence of unspecified artificial knee joint: Secondary | ICD-10-CM | POA: Diagnosis not present

## 2013-09-02 DIAGNOSIS — G4733 Obstructive sleep apnea (adult) (pediatric): Secondary | ICD-10-CM

## 2013-09-02 DIAGNOSIS — F329 Major depressive disorder, single episode, unspecified: Secondary | ICD-10-CM | POA: Diagnosis present

## 2013-09-02 DIAGNOSIS — E559 Vitamin D deficiency, unspecified: Secondary | ICD-10-CM | POA: Diagnosis present

## 2013-09-02 DIAGNOSIS — E119 Type 2 diabetes mellitus without complications: Secondary | ICD-10-CM | POA: Diagnosis present

## 2013-09-02 DIAGNOSIS — Z7982 Long term (current) use of aspirin: Secondary | ICD-10-CM | POA: Diagnosis not present

## 2013-09-02 DIAGNOSIS — Z8249 Family history of ischemic heart disease and other diseases of the circulatory system: Secondary | ICD-10-CM | POA: Diagnosis not present

## 2013-09-02 DIAGNOSIS — Z79899 Other long term (current) drug therapy: Secondary | ICD-10-CM

## 2013-09-02 DIAGNOSIS — Z833 Family history of diabetes mellitus: Secondary | ICD-10-CM | POA: Diagnosis not present

## 2013-09-02 DIAGNOSIS — Z6841 Body Mass Index (BMI) 40.0 and over, adult: Secondary | ICD-10-CM

## 2013-09-02 DIAGNOSIS — I10 Essential (primary) hypertension: Secondary | ICD-10-CM | POA: Diagnosis present

## 2013-09-02 DIAGNOSIS — M1611 Unilateral primary osteoarthritis, right hip: Secondary | ICD-10-CM

## 2013-09-02 DIAGNOSIS — Z800 Family history of malignant neoplasm of digestive organs: Secondary | ICD-10-CM | POA: Diagnosis not present

## 2013-09-02 DIAGNOSIS — M25559 Pain in unspecified hip: Secondary | ICD-10-CM | POA: Diagnosis present

## 2013-09-02 DIAGNOSIS — K219 Gastro-esophageal reflux disease without esophagitis: Secondary | ICD-10-CM | POA: Diagnosis present

## 2013-09-02 DIAGNOSIS — F3289 Other specified depressive episodes: Secondary | ICD-10-CM | POA: Diagnosis present

## 2013-09-02 DIAGNOSIS — Z9851 Tubal ligation status: Secondary | ICD-10-CM | POA: Diagnosis not present

## 2013-09-02 DIAGNOSIS — M169 Osteoarthritis of hip, unspecified: Principal | ICD-10-CM | POA: Diagnosis present

## 2013-09-02 DIAGNOSIS — Z801 Family history of malignant neoplasm of trachea, bronchus and lung: Secondary | ICD-10-CM | POA: Diagnosis not present

## 2013-09-02 DIAGNOSIS — E785 Hyperlipidemia, unspecified: Secondary | ICD-10-CM | POA: Diagnosis present

## 2013-09-02 DIAGNOSIS — Z8542 Personal history of malignant neoplasm of other parts of uterus: Secondary | ICD-10-CM | POA: Diagnosis not present

## 2013-09-02 DIAGNOSIS — M1610 Unilateral primary osteoarthritis, unspecified hip: Principal | ICD-10-CM | POA: Diagnosis present

## 2013-09-02 DIAGNOSIS — Z888 Allergy status to other drugs, medicaments and biological substances status: Secondary | ICD-10-CM

## 2013-09-02 HISTORY — PX: TOTAL HIP ARTHROPLASTY: SHX124

## 2013-09-02 LAB — GLUCOSE, CAPILLARY
GLUCOSE-CAPILLARY: 253 mg/dL — AB (ref 70–99)
Glucose-Capillary: 214 mg/dL — ABNORMAL HIGH (ref 70–99)
Glucose-Capillary: 219 mg/dL — ABNORMAL HIGH (ref 70–99)
Glucose-Capillary: 251 mg/dL — ABNORMAL HIGH (ref 70–99)

## 2013-09-02 SURGERY — ARTHROPLASTY, HIP, TOTAL, ANTERIOR APPROACH
Anesthesia: General | Site: Hip | Laterality: Right

## 2013-09-02 MED ORDER — ESCITALOPRAM OXALATE 10 MG PO TABS
10.0000 mg | ORAL_TABLET | Freq: Every day | ORAL | Status: DC
  Administered 2013-09-03: 10 mg via ORAL
  Filled 2013-09-02: qty 1

## 2013-09-02 MED ORDER — BISACODYL 5 MG PO TBEC: 5.0000 mg | DELAYED_RELEASE_TABLET | Freq: Every day | ORAL | Status: DC | PRN

## 2013-09-02 MED ORDER — ONDANSETRON HCL 4 MG PO TABS: 4.0000 mg | ORAL_TABLET | Freq: Four times a day (QID) | ORAL | Status: DC | PRN

## 2013-09-02 MED ORDER — BUPIVACAINE HCL (PF) 0.25 % IJ SOLN
INTRAMUSCULAR | Status: AC
  Filled 2013-09-02: qty 30

## 2013-09-02 MED ORDER — ARTIFICIAL TEARS OP OINT
TOPICAL_OINTMENT | OPHTHALMIC | Status: AC
  Filled 2013-09-02: qty 3.5

## 2013-09-02 MED ORDER — BUPIVACAINE HCL 0.25 % IJ SOLN
INTRAMUSCULAR | Status: DC | PRN
Start: 2013-09-02 — End: 2013-09-02
  Administered 2013-09-02: 20 mL

## 2013-09-02 MED ORDER — PANTOPRAZOLE SODIUM 40 MG PO TBEC
40.0000 mg | DELAYED_RELEASE_TABLET | Freq: Every day | ORAL | Status: DC
  Administered 2013-09-03: 40 mg via ORAL
  Filled 2013-09-02: qty 1

## 2013-09-02 MED ORDER — MIDAZOLAM HCL 2 MG/2ML IJ SOLN
INTRAMUSCULAR | Status: AC
  Filled 2013-09-02: qty 2

## 2013-09-02 MED ORDER — PHENOL 1.4 % MT LIQD: 1.0000 | OROMUCOSAL | Status: DC | PRN

## 2013-09-02 MED ORDER — GLYCOPYRROLATE 0.2 MG/ML IJ SOLN
INTRAMUSCULAR | Status: DC | PRN
Start: 2013-09-02 — End: 2013-09-02
  Administered 2013-09-02: 0.6 mg via INTRAVENOUS

## 2013-09-02 MED ORDER — OXYCODONE HCL 5 MG PO TABS
5.0000 mg | ORAL_TABLET | Freq: Once | ORAL | Status: AC | PRN
  Administered 2013-09-02: 5 mg via ORAL

## 2013-09-02 MED ORDER — GEMFIBROZIL 600 MG PO TABS
600.0000 mg | ORAL_TABLET | Freq: Every day | ORAL | Status: DC
  Administered 2013-09-03: 600 mg via ORAL
  Filled 2013-09-02 (×2): qty 1

## 2013-09-02 MED ORDER — ONDANSETRON HCL 4 MG/2ML IJ SOLN: 4.0000 mg | Freq: Four times a day (QID) | INTRAMUSCULAR | Status: DC | PRN

## 2013-09-02 MED ORDER — FENTANYL CITRATE 0.05 MG/ML IJ SOLN
INTRAMUSCULAR | Status: AC
  Filled 2013-09-02: qty 5

## 2013-09-02 MED ORDER — PROMETHAZINE HCL 25 MG/ML IJ SOLN: 12.5000 mg | Freq: Four times a day (QID) | INTRAMUSCULAR | Status: DC | PRN

## 2013-09-02 MED ORDER — INSULIN ASPART 100 UNIT/ML ~~LOC~~ SOLN
0.0000 [IU] | Freq: Three times a day (TID) | SUBCUTANEOUS | Status: DC
  Administered 2013-09-03: 8 [IU] via SUBCUTANEOUS
  Administered 2013-09-03: 5 [IU] via SUBCUTANEOUS

## 2013-09-02 MED ORDER — HYDROMORPHONE HCL PF 1 MG/ML IJ SOLN
0.2500 mg | INTRAMUSCULAR | Status: DC | PRN
  Administered 2013-09-02: 0.5 mg via INTRAVENOUS

## 2013-09-02 MED ORDER — LACTATED RINGERS IV SOLN
INTRAVENOUS | Status: DC
  Administered 2013-09-02: 10 mL/h via INTRAVENOUS

## 2013-09-02 MED ORDER — ALUM & MAG HYDROXIDE-SIMETH 200-200-20 MG/5ML PO SUSP: 30.0000 mL | ORAL | Status: DC | PRN

## 2013-09-02 MED ORDER — CARVEDILOL 25 MG PO TABS
25.0000 mg | ORAL_TABLET | Freq: Two times a day (BID) | ORAL | Status: DC
  Administered 2013-09-02 – 2013-09-03 (×2): 25 mg via ORAL
  Filled 2013-09-02 (×4): qty 1

## 2013-09-02 MED ORDER — PROPOFOL 10 MG/ML IV BOLUS
INTRAVENOUS | Status: DC | PRN
  Administered 2013-09-02: 150 mg via INTRAVENOUS

## 2013-09-02 MED ORDER — HYDROMORPHONE HCL PF 1 MG/ML IJ SOLN
1.0000 mg | INTRAMUSCULAR | Status: DC | PRN
  Administered 2013-09-03: 1 mg via INTRAVENOUS
  Filled 2013-09-02: qty 1

## 2013-09-02 MED ORDER — POLYETHYLENE GLYCOL 3350 17 G PO PACK: 17.0000 g | PACK | Freq: Every day | ORAL | Status: DC | PRN

## 2013-09-02 MED ORDER — ATORVASTATIN CALCIUM 40 MG PO TABS
40.0000 mg | ORAL_TABLET | Freq: Every day | ORAL | Status: DC
  Administered 2013-09-02: 40 mg via ORAL
  Filled 2013-09-02 (×2): qty 1

## 2013-09-02 MED ORDER — SODIUM CHLORIDE 0.9 % IV SOLN
INTRAVENOUS | Status: DC
  Administered 2013-09-02: 100 mL/h via INTRAVENOUS
  Administered 2013-09-03: 04:00:00 via INTRAVENOUS

## 2013-09-02 MED ORDER — FENTANYL CITRATE 0.05 MG/ML IJ SOLN
INTRAMUSCULAR | Status: DC | PRN
  Administered 2013-09-02 (×3): 50 ug via INTRAVENOUS
  Administered 2013-09-02 (×2): 100 ug via INTRAVENOUS

## 2013-09-02 MED ORDER — OXYCODONE-ACETAMINOPHEN 5-325 MG PO TABS: 1.0000 | ORAL_TABLET | ORAL | Status: DC | PRN

## 2013-09-02 MED ORDER — ASPIRIN EC 325 MG PO TBEC
325.0000 mg | DELAYED_RELEASE_TABLET | Freq: Two times a day (BID) | ORAL | Status: DC
  Administered 2013-09-02 – 2013-09-03 (×2): 325 mg via ORAL
  Filled 2013-09-02 (×4): qty 1

## 2013-09-02 MED ORDER — NEOSTIGMINE METHYLSULFATE 10 MG/10ML IV SOLN
INTRAVENOUS | Status: DC | PRN
  Administered 2013-09-02: 4 mg via INTRAVENOUS

## 2013-09-02 MED ORDER — MENTHOL 3 MG MT LOZG: 1.0000 | LOZENGE | OROMUCOSAL | Status: DC | PRN

## 2013-09-02 MED ORDER — HYDROMORPHONE HCL PF 1 MG/ML IJ SOLN
INTRAMUSCULAR | Status: AC
  Filled 2013-09-02: qty 1

## 2013-09-02 MED ORDER — DEXTROSE 5 % IV SOLN
INTRAVENOUS | Status: DC | PRN
  Administered 2013-09-02: 12:00:00 via INTRAVENOUS

## 2013-09-02 MED ORDER — METFORMIN HCL 500 MG PO TABS
1000.0000 mg | ORAL_TABLET | Freq: Two times a day (BID) | ORAL | Status: DC
  Administered 2013-09-02 – 2013-09-03 (×2): 1000 mg via ORAL
  Filled 2013-09-02 (×4): qty 2

## 2013-09-02 MED ORDER — ONDANSETRON HCL 4 MG/2ML IJ SOLN
INTRAMUSCULAR | Status: AC
  Filled 2013-09-02: qty 2

## 2013-09-02 MED ORDER — BUPIVACAINE LIPOSOME 1.3 % IJ SUSP
INTRAMUSCULAR | Status: DC | PRN
Start: 2013-09-02 — End: 2013-09-02
  Administered 2013-09-02: 20 mL

## 2013-09-02 MED ORDER — KETOROLAC TROMETHAMINE 15 MG/ML IJ SOLN
15.0000 mg | Freq: Four times a day (QID) | INTRAMUSCULAR | Status: AC
  Administered 2013-09-02 – 2013-09-03 (×4): 15 mg via INTRAVENOUS
  Filled 2013-09-02 (×3): qty 1

## 2013-09-02 MED ORDER — ACETAMINOPHEN 650 MG RE SUPP: 650.0000 mg | Freq: Four times a day (QID) | RECTAL | Status: DC | PRN

## 2013-09-02 MED ORDER — LIDOCAINE HCL (CARDIAC) 20 MG/ML IV SOLN
INTRAVENOUS | Status: DC | PRN
  Administered 2013-09-02: 75 mg via INTRAVENOUS

## 2013-09-02 MED ORDER — OXYCODONE HCL 5 MG PO TABS
ORAL_TABLET | ORAL | Status: AC
  Filled 2013-09-02: qty 1

## 2013-09-02 MED ORDER — LACTATED RINGERS IV SOLN
INTRAVENOUS | Status: DC | PRN
  Administered 2013-09-02 (×3): via INTRAVENOUS

## 2013-09-02 MED ORDER — OXYCODONE HCL 5 MG/5ML PO SOLN: 5.0000 mg | Freq: Once | ORAL | Status: AC | PRN

## 2013-09-02 MED ORDER — METHOCARBAMOL 500 MG PO TABS
500.0000 mg | ORAL_TABLET | Freq: Four times a day (QID) | ORAL | Status: DC | PRN
  Administered 2013-09-02 – 2013-09-03 (×2): 500 mg via ORAL
  Filled 2013-09-02 (×2): qty 1

## 2013-09-02 MED ORDER — BUPIVACAINE LIPOSOME 1.3 % IJ SUSP
20.0000 mL | INTRAMUSCULAR | Status: DC
  Filled 2013-09-02: qty 20

## 2013-09-02 MED ORDER — ONDANSETRON HCL 4 MG/2ML IJ SOLN
INTRAMUSCULAR | Status: DC | PRN
  Administered 2013-09-02: 4 mg via INTRAVENOUS

## 2013-09-02 MED ORDER — METHOCARBAMOL 750 MG PO TABS: 750.0000 mg | ORAL_TABLET | Freq: Three times a day (TID) | ORAL | Status: DC | PRN

## 2013-09-02 MED ORDER — ACETAMINOPHEN 325 MG PO TABS
650.0000 mg | ORAL_TABLET | Freq: Four times a day (QID) | ORAL | Status: DC | PRN
Start: 2013-09-02 — End: 2013-09-03

## 2013-09-02 MED ORDER — CEFAZOLIN SODIUM-DEXTROSE 2-3 GM-% IV SOLR
2.0000 g | Freq: Four times a day (QID) | INTRAVENOUS | Status: AC
  Administered 2013-09-02 (×2): 2 g via INTRAVENOUS
  Filled 2013-09-02 (×2): qty 50

## 2013-09-02 MED ORDER — BENAZEPRIL HCL 40 MG PO TABS
40.0000 mg | ORAL_TABLET | Freq: Every day | ORAL | Status: DC
  Administered 2013-09-03: 40 mg via ORAL
  Filled 2013-09-02: qty 1

## 2013-09-02 MED ORDER — DOCUSATE SODIUM 100 MG PO CAPS
100.0000 mg | ORAL_CAPSULE | Freq: Two times a day (BID) | ORAL | Status: DC
  Administered 2013-09-02 – 2013-09-03 (×2): 100 mg via ORAL
  Filled 2013-09-02 (×3): qty 1

## 2013-09-02 MED ORDER — TRANEXAMIC ACID 100 MG/ML IV SOLN
1000.0000 mg | INTRAVENOUS | Status: DC | PRN
  Administered 2013-09-02: 1000 mg via INTRAVENOUS

## 2013-09-02 MED ORDER — LORAZEPAM 0.5 MG PO TABS: 0.5000 mg | ORAL_TABLET | Freq: Every evening | ORAL | Status: DC | PRN

## 2013-09-02 MED ORDER — MIDAZOLAM HCL 5 MG/5ML IJ SOLN
INTRAMUSCULAR | Status: DC | PRN
  Administered 2013-09-02 (×2): 1 mg via INTRAVENOUS

## 2013-09-02 MED ORDER — GLYCOPYRROLATE 0.2 MG/ML IJ SOLN
INTRAMUSCULAR | Status: AC
  Filled 2013-09-02: qty 3

## 2013-09-02 MED ORDER — ROCURONIUM BROMIDE 50 MG/5ML IV SOLN
INTRAVENOUS | Status: AC
  Filled 2013-09-02: qty 2

## 2013-09-02 MED ORDER — METHOCARBAMOL 1000 MG/10ML IJ SOLN
500.0000 mg | Freq: Four times a day (QID) | INTRAVENOUS | Status: DC | PRN
  Filled 2013-09-02: qty 5

## 2013-09-02 MED ORDER — OXYCODONE-ACETAMINOPHEN 5-325 MG PO TABS
1.0000 | ORAL_TABLET | ORAL | Status: DC | PRN
  Administered 2013-09-02: 1 via ORAL
  Administered 2013-09-03 (×2): 2 via ORAL
  Filled 2013-09-02 (×3): qty 2

## 2013-09-02 MED ORDER — PROPOFOL 10 MG/ML IV BOLUS
INTRAVENOUS | Status: AC
  Filled 2013-09-02: qty 20

## 2013-09-02 MED ORDER — 0.9 % SODIUM CHLORIDE (POUR BTL) OPTIME
TOPICAL | Status: DC | PRN
  Administered 2013-09-02: 1000 mL

## 2013-09-02 MED ORDER — ROCURONIUM BROMIDE 100 MG/10ML IV SOLN
INTRAVENOUS | Status: DC | PRN
  Administered 2013-09-02 (×2): 10 mg via INTRAVENOUS
  Administered 2013-09-02: 30 mg via INTRAVENOUS
  Administered 2013-09-02: 50 mg via INTRAVENOUS
  Administered 2013-09-02 (×2): 10 mg via INTRAVENOUS

## 2013-09-02 MED ORDER — ASPIRIN EC 325 MG PO TBEC: 325.0000 mg | DELAYED_RELEASE_TABLET | Freq: Two times a day (BID) | ORAL | Status: DC

## 2013-09-02 MED ORDER — TRANEXAMIC ACID 100 MG/ML IV SOLN
1000.0000 mg | INTRAVENOUS | Status: DC
  Filled 2013-09-02: qty 10

## 2013-09-02 MED ORDER — LIDOCAINE HCL (CARDIAC) 20 MG/ML IV SOLN
INTRAVENOUS | Status: AC
  Filled 2013-09-02: qty 5

## 2013-09-02 SURGICAL SUPPLY — 50 items
BANDAGE GAUZE ELAST BULKY 4 IN (GAUZE/BANDAGES/DRESSINGS) IMPLANT
BENZOIN TINCTURE PRP APPL 2/3 (GAUZE/BANDAGES/DRESSINGS) ×2 IMPLANT
BLADE SAW SGTL 18X1.27X75 (BLADE) ×2 IMPLANT
BLADE SURG ROTATE 9660 (MISCELLANEOUS) IMPLANT
BNDG COHESIVE 6X5 TAN STRL LF (GAUZE/BANDAGES/DRESSINGS) IMPLANT
CAPT HIP PF MOP ×2 IMPLANT
CELLS DAT CNTRL 66122 CELL SVR (MISCELLANEOUS) ×1 IMPLANT
CLSR STERI-STRIP ANTIMIC 1/2X4 (GAUZE/BANDAGES/DRESSINGS) ×2 IMPLANT
COVER SURGICAL LIGHT HANDLE (MISCELLANEOUS) ×2 IMPLANT
DRAPE C-ARM 42X72 X-RAY (DRAPES) ×2 IMPLANT
DRAPE STERI IOBAN 125X83 (DRAPES) ×2 IMPLANT
DRAPE U-SHAPE 47X51 STRL (DRAPES) ×6 IMPLANT
DRSG MEPILEX BORDER 4X8 (GAUZE/BANDAGES/DRESSINGS) ×2 IMPLANT
DURAPREP 26ML APPLICATOR (WOUND CARE) ×2 IMPLANT
ELECT BLADE 4.0 EZ CLEAN MEGAD (MISCELLANEOUS)
ELECT CAUTERY BLADE 6.4 (BLADE) ×2 IMPLANT
ELECT REM PT RETURN 9FT ADLT (ELECTROSURGICAL) ×2
ELECTRODE BLDE 4.0 EZ CLN MEGD (MISCELLANEOUS) IMPLANT
ELECTRODE REM PT RTRN 9FT ADLT (ELECTROSURGICAL) ×1 IMPLANT
ELIMINATOR HOLE APEX DEPUY (Hips) IMPLANT
GAUZE XEROFORM 1X8 LF (GAUZE/BANDAGES/DRESSINGS) ×2 IMPLANT
GLOVE BIOGEL PI IND STRL 8 (GLOVE) ×2 IMPLANT
GLOVE BIOGEL PI INDICATOR 8 (GLOVE) ×2
GLOVE ECLIPSE 7.5 STRL STRAW (GLOVE) ×4 IMPLANT
GOWN STRL REUS W/ TWL LRG LVL3 (GOWN DISPOSABLE) ×2 IMPLANT
GOWN STRL REUS W/ TWL XL LVL3 (GOWN DISPOSABLE) ×2 IMPLANT
GOWN STRL REUS W/TWL LRG LVL3 (GOWN DISPOSABLE) ×2
GOWN STRL REUS W/TWL XL LVL3 (GOWN DISPOSABLE) ×2
HOOD PEEL AWAY FACE SHEILD DIS (HOOD) ×4 IMPLANT
KIT BASIN OR (CUSTOM PROCEDURE TRAY) ×2 IMPLANT
KIT ROOM TURNOVER OR (KITS) ×2 IMPLANT
MANIFOLD NEPTUNE II (INSTRUMENTS) ×2 IMPLANT
NS IRRIG 1000ML POUR BTL (IV SOLUTION) ×2 IMPLANT
PACK TOTAL JOINT (CUSTOM PROCEDURE TRAY) ×2 IMPLANT
PAD ARMBOARD 7.5X6 YLW CONV (MISCELLANEOUS) ×4 IMPLANT
RTRCTR WOUND ALEXIS 18CM MED (MISCELLANEOUS) ×2
SPONGE LAP 18X18 X RAY DECT (DISPOSABLE) IMPLANT
STAPLER VISISTAT 35W (STAPLE) IMPLANT
SUT ETHIBOND NAB CT1 #1 30IN (SUTURE) ×4 IMPLANT
SUT MNCRL AB 3-0 PS2 18 (SUTURE) IMPLANT
SUT VIC AB 0 CT1 27 (SUTURE) ×1
SUT VIC AB 0 CT1 27XBRD ANBCTR (SUTURE) ×1 IMPLANT
SUT VIC AB 1 CT1 27 (SUTURE) ×4
SUT VIC AB 1 CT1 27XBRD ANBCTR (SUTURE) ×4 IMPLANT
SUT VIC AB 2-0 CT1 27 (SUTURE) ×1
SUT VIC AB 2-0 CT1 TAPERPNT 27 (SUTURE) ×1 IMPLANT
TOWEL OR 17X24 6PK STRL BLUE (TOWEL DISPOSABLE) ×2 IMPLANT
TOWEL OR 17X26 10 PK STRL BLUE (TOWEL DISPOSABLE) ×2 IMPLANT
TRAY FOLEY CATH 16FR SILVER (SET/KITS/TRAYS/PACK) IMPLANT
WATER STERILE IRR 1000ML POUR (IV SOLUTION) ×4 IMPLANT

## 2013-09-02 NOTE — Anesthesia Preprocedure Evaluation (Addendum)
Anesthesia Evaluation  Patient identified by MRN, date of birth, ID band Patient awake    Reviewed: Allergy & Precautions, H&P , NPO status , Patient's Chart, lab work & pertinent test results, reviewed documented beta blocker date and time   Airway Mallampati: II TM Distance: >3 FB Neck ROM: full    Dental  (+) Partial Upper, Partial Lower, Dental Advisory Given   Pulmonary sleep apnea and Continuous Positive Airway Pressure Ventilation ,          Cardiovascular hypertension, Pt. on home beta blockers and Pt. on medications     Neuro/Psych Depression    GI/Hepatic GERD-  Medicated and Controlled,  Endo/Other  diabetes, Well Controlled, Type 2, Oral Hypoglycemic AgentsMorbid obesity  Renal/GU      Musculoskeletal  (+) Arthritis -,   Abdominal   Peds  Hematology   Anesthesia Other Findings Pt states that she stopped her Mobic several days ago.  Reproductive/Obstetrics                          Anesthesia Physical Anesthesia Plan  ASA: II  Anesthesia Plan: General   Post-op Pain Management:    Induction: Intravenous  Airway Management Planned: Oral ETT  Additional Equipment:   Intra-op Plan:   Post-operative Plan: Extubation in OR  Informed Consent: I have reviewed the patients History and Physical, chart, labs and discussed the procedure including the risks, benefits and alternatives for the proposed anesthesia with the patient or authorized representative who has indicated his/her understanding and acceptance.     Plan Discussed with: CRNA, Anesthesiologist and Surgeon  Anesthesia Plan Comments:         Anesthesia Quick Evaluation

## 2013-09-02 NOTE — Anesthesia Procedure Notes (Signed)
Procedure Name: Intubation Date/Time: 09/02/2013 11:45 AM Performed by: Terrill Mohr Pre-anesthesia Checklist: Patient identified, Emergency Drugs available, Suction available and Patient being monitored Patient Re-evaluated:Patient Re-evaluated prior to inductionOxygen Delivery Method: Circle system utilized Preoxygenation: Pre-oxygenation with 100% oxygen Intubation Type: IV induction Ventilation: Mask ventilation without difficulty Laryngoscope Size: Mac and 3 Grade View: Grade II Tube type: Oral Tube size: 7.5 mm Number of attempts: 1 Airway Equipment and Method: Stylet Placement Confirmation: ETT inserted through vocal cords under direct vision,  breath sounds checked- equal and bilateral and positive ETCO2 Secured at: 21 (cm at upper gum) cm Tube secured with: Tape Dental Injury: Teeth and Oropharynx as per pre-operative assessment

## 2013-09-02 NOTE — Telephone Encounter (Signed)
Pt's daughter Phillis Knack calling w/ reports that pt has been having ongoing issues with [facility] and getting pt's CPAP supplies.  Pt is supposed to received new CPAP supplies every 3 months but [facility] never sends them and pt has to call.  Most recently, pt was due for supplies in May 2015 but never received.  Pt called [facility] at the end of June and was told they would be sent, but never were.  Pt called again last week and was told this time that due to paperwork, it will now be at least August before pt receives her supplies.  [name] is now requesting to change DME companies.  Dr Gwenette Greet please advise if this is okay to do.  Thank you. Last ov 11.3.14, upcoming 11.3.15

## 2013-09-02 NOTE — Brief Op Note (Signed)
09/02/2013  4:35 PM  PATIENT:  [name] [name]  63 y.o. female  PRE-OPERATIVE DIAGNOSIS:  DEGENERATIVE JOINT DISEASE RIGHT HIP  POST-OPERATIVE DIAGNOSIS:  DEGENERATIVE JOINT DISEASE RIGHT HIP  PROCEDURE:  Procedure(s): RIGHT TOTAL HIP ARTHROPLASTY ANTERIOR APPROACH (Right)  SURGEON:  Surgeon(s) and Role:    * Alta Corning, MD - Primary  PHYSICIAN ASSISTANT:   ASSISTANTS: bethune    ANESTHESIA:   general  EBL:  Total I/O In: 2750 [I.V.:2750] Out: 550 [Urine:300; Blood:250]  BLOOD ADMINISTERED:none  DRAINS: none   LOCAL MEDICATIONS USED:  MARCAINE   , NONE and OTHER experel  SPECIMEN:  No Specimen  DISPOSITION OF SPECIMEN:  N/A  COUNTS:  YES  TOURNIQUET:  * No tourniquets in log *  DICTATION: .Other Dictation: Dictation Number [id number]  PLAN OF CARE: Admit to inpatient   PATIENT DISPOSITION:  PACU - hemodynamically stable.   Delay start of Pharmacological VTE agent (>24hrs) due to surgical blood loss or risk of bleeding: no

## 2013-09-02 NOTE — Telephone Encounter (Signed)
Called and spoke with pts daughter and she stated that they will either go with [facility] or [facility].  pts daughter will call her insurance on Monday to see which DME is covered and will call back on Monday.

## 2013-09-02 NOTE — Transfer of Care (Signed)
Immediate Anesthesia Transfer of Care Note  Patient: [name] [name]  Procedure(s) Performed: Procedure(s): RIGHT TOTAL HIP ARTHROPLASTY ANTERIOR APPROACH (Right)  Patient Location: PACU  Anesthesia Type:General  Level of Consciousness: awake, alert  and patient cooperative  Airway & Oxygen Therapy: Patient Spontanous Breathing and Patient connected to nasal cannula oxygen  Post-op Assessment: Report given to PACU RN and Post -op Vital signs reviewed and stable  Post vital signs: Reviewed and stable  Complications: No apparent anesthesia complications

## 2013-09-02 NOTE — Discharge Instructions (Signed)
Total Hip Replacement Care After Refer to this sheet in the next few weeks. These instructions provide you with information on caring for yourself after your procedure. Your caregiver also may give you specific instructions. Your treatment has been planned according to the most current medical practices, but problems sometimes occur. Call your caregiver if you have any problems or questions after your procedure. HOME CARE INSTRUCTIONS  Your caregiver will give you specific precautions for certain types of movement. Additional instructions include:  Take over-the-counter or prescription medicines for pain, discomfort, or fever only as directed by your caregiver.  Take quick showers (3-5 min) rather than bathe until your caregiver tells you that you can take baths again.  Avoid lifting until your caregiver instructs you otherwise.  Use a raised toilet seat and avoid sitting in low chairs as instructed by your caregiver.  Use crutches or a walker as instructed by your caregiver. SEEK MEDICAL CARE IF:  You have difficulty breathing.  Your wound is red, swollen, or has become increasingly painful.  You have pus draining from your wound.  You have a bad smell coming from your wound.  You have persistent bleeding from your wound.  Your wound breaks open after sutures (stitches) or staples have been removed. SEEK IMMEDIATE MEDICAL CARE IF:   You have a fever.  You have a rash.  You have pain or swelling in your calf or thigh.  You have shortness of breath or chest pain. MAKE SURE YOU:  Understand these instructions.  Will watch your condition.  Will get help right away if you are not doing well or get worse. Document Released: 08/30/2004 Document Revised: 08/12/2011 Document Reviewed: 03/30/2011 [facility] Patient Information 2015 [place], [place]. This information is not intended to replace advice given to you by your health care provider. Make sure you discuss any questions you  have with your health care provider.

## 2013-09-02 NOTE — H&P (Signed)
TOTAL HIP ADMISSION H&P  Patient is admitted for right total hip arthroplasty.  Subjective:  Chief Complaint: right hip pain  HPI: [name] [name], 63 y.o. female, has a history of pain and functional disability in the right hip(s) due to arthritis and patient has failed non-surgical conservative treatments for greater than 12 weeks to include NSAID's and/or analgesics, flexibility and strengthening excercises, supervised PT with diminished ADL's post treatment, weight reduction as appropriate and activity modification.  Onset of symptoms was gradual starting 2 years ago with gradually worsening course since that time.The patient noted no past surgery on the right hip(s).  Patient currently rates pain in the right hip at 10 out of 10 with activity. Patient has night pain, worsening of pain with activity and weight bearing, trendelenberg gait, pain that interfers with activities of daily living, pain with passive range of motion, crepitus and joint swelling. Patient has evidence of subchondral sclerosis, periarticular osteophytes and joint space narrowing by imaging studies. This condition presents safety issues increasing the risk of falls. This patient has had failure of conservative care.  There is no current active infection.  Patient Active Problem List   Diagnosis Date Noted  . Cystocele 07/03/2013  . Vaginal pessary present 04/25/2013  . Prolapse of vaginal vault after hysterectomy 04/25/2013  . Endometrial cancer, grade I 04/25/2013  . Unspecified vitamin D deficiency 04/25/2013  . Osteoarthritis of left knee 08/02/2012  . Urinary, incontinence, stress female 06/07/2012  . HTN (hypertension) 06/07/2012  . Diabetes 06/07/2012  . OSA (obstructive sleep apnea) 02/16/2012   Past Medical History  Diagnosis Date  . Hyperlipidemia   . GERD (gastroesophageal reflux disease)   . Cancer, uterine 05/2003    F1G01,G1  . Diabetes 2005  . HTN (hypertension) 2005  . Biliary dyskinesia 07/2006   . Urinary, incontinence, stress female 12/13    using Pessary 01/2012  . Gastroparesis 7/08    Dr. Collene Mares  . Arthritis     Hx: of T/O  . Diverticulosis     Hx: of  . Sleep apnea     CPAP  . Depression     Past Surgical History  Procedure Laterality Date  . Tubal ligation    . Knee arthroscopy  2010    left  . Total abdominal hysterectomy w/ bilateral salpingoophorectomy  05/2003    endometrial carcinoma F1G01, G1  . Cholecystectomy, laparoscopic  10/2006  . Colonoscopy w/ biopsies  10/04    polyp  . Colonoscopy w/ biopsies  02/12/05    benign polyp  . Colonoscopy with esophagogastroduodenoscopy (egd)  09/14/06    2 polyps, diverticula, diffuse gastritis  . Colonoscopy w/ biopsies  10/26/09    1 polyp benign, recheck 5 years  . Total knee arthroplasty Left 08/02/2012    Procedure: TOTAL KNEE ARTHROPLASTY;  Surgeon: Alta Corning, MD;  Location: [place];  Service: Orthopedics;  Laterality: Left;  left total knee arthroplasty  . Knee arthroscopy Right 11/2012    Prescriptions prior to admission  Medication Sig Dispense Refill  . aspirin EC 81 MG tablet Take 81 mg by mouth daily.      Marland Kitchen atorvastatin (LIPITOR) 40 MG tablet Take 40 mg by mouth daily.      . benazepril (LOTENSIN) 40 MG tablet Take 40 mg by mouth daily.      . carvedilol (COREG) 25 MG tablet Take 25 mg by mouth 2 (two) times daily with a meal.      . ergocalciferol (VITAMIN D2) 50000  UNITS capsule Take 1 capsule (50,000 Units total) by mouth once a week.  30 capsule  3  . escitalopram (LEXAPRO) 20 MG tablet Take 10 mg by mouth daily.      . Estradiol (VAGIFEM) 10 MCG TABS vaginal tablet Place 0.5 tablets (5 mcg total) vaginally 2 (two) times a week. Twice a week - on Sunday and Wednesday  24 tablet  3  . gemfibrozil (LOPID) 600 MG tablet Take 600 mg by mouth daily.       Marland Kitchen LORazepam (ATIVAN) 0.5 MG tablet Take 0.5-1 mg by mouth at bedtime as needed for anxiety or sleep.      . meloxicam (MOBIC) 15 MG tablet Take 15 mg by  mouth daily.      . metFORMIN (GLUCOPHAGE) 1000 MG tablet Take 1,000 mg by mouth 2 (two) times daily with a meal.      . methocarbamol (ROBAXIN-750) 750 MG tablet One every 8hrs prn spasm.  50 tablet  0  . omeprazole (PRILOSEC) 20 MG capsule Take 20 mg by mouth 2 (two) times daily before a meal.        Allergies  Allergen Reactions  . Codeine Palpitations    History  Substance Use Topics  . Smoking status: Never Smoker   . Smokeless tobacco: Never Used  . Alcohol Use: No    Family History  Problem Relation Age of Onset  . Heart disease Father 29    CABG  . Lung cancer Father 57    lung  . Hypertension Father   . Diabetes Father   . Heart disease Sister 11    CABG  . Diabetes Sister 76  . Heart disease Brother 62    MI with stents  . Pancreatic cancer Other     x 3 - 2 MAunts and 1 P Aunts.   . Lupus Other     Pat Aunt  . Hypertension Mother   . Diabetes Mother   . Hypertension Sister     x2   . Diabetes Sister   . Hypertension Brother   . Diabetes Brother      ROS ROS: I have reviewed the patient's review of systems thoroughly and there are no positive responses as relates to the HPI.  Objective:  Physical Exam  Vital signs in last 24 hours: Temp:  [98 F (36.7 C)] 98 F (36.7 C) (07/10 0855) Pulse Rate:  [65] 65 (07/10 0855) Resp:  [20] 20 (07/10 0855) BP: (177)/(56) 177/56 mmHg (07/10 0855) SpO2:  [96 %] 96 % (07/10 0855) Weight:  [255 lb (115.667 kg)] 255 lb (115.667 kg) (07/10 0855) Well-developed well-nourished patient in no acute distress. Alert and oriented x3 HEENT:within normal limits Cardiac: Regular rate and rhythm Pulmonary: Lungs clear to auscultation Abdomen: Soft and nontender.  Normal active bowel sounds  Musculoskeletal: (R hip: painful rom limited int rotation Labs: Recent Results (from the past 2160 hour(s))  TYPE AND SCREEN     Status: None   Collection Time    08/24/13  9:30 AM      Result Value Ref Range   ABO/RH(D) A POS      Antibody Screen NEG     Sample Expiration 09/07/2013    SURGICAL PCR SCREEN     Status: None   Collection Time    08/24/13  9:34 AM      Result Value Ref Range   MRSA, PCR NEGATIVE  NEGATIVE   Staphylococcus aureus NEGATIVE  NEGATIVE   Comment:  The Xpert SA Assay (FDA     approved for NASAL specimens     in patients over 76 years of age),     is one component of     a comprehensive surveillance     program.  Test performance has     been validated by [employer] for patients greater     than or equal to 39 year old.     It is not intended     to diagnose infection nor to     guide or monitor treatment.  APTT     Status: None   Collection Time    08/24/13  9:35 AM      Result Value Ref Range   aPTT 26  24 - 37 seconds  BASIC METABOLIC PANEL     Status: Abnormal   Collection Time    08/24/13  9:35 AM      Result Value Ref Range   Sodium 140  137 - 147 mEq/L   Potassium 4.4  3.7 - 5.3 mEq/L   Chloride 99  96 - 112 mEq/L   CO2 24  19 - 32 mEq/L   Glucose, Bld 224 (*) 70 - 99 mg/dL   BUN 18  6 - 23 mg/dL   Creatinine, Ser 0.62  0.50 - 1.10 mg/dL   Calcium 9.2  8.4 - 10.5 mg/dL   GFR calc non Af Amer >90  >90 mL/min   GFR calc Af Amer >90  >90 mL/min   Comment: (NOTE)     The eGFR has been calculated using the CKD EPI equation.     This calculation has not been validated in all clinical situations.     eGFR's persistently <90 mL/min signify possible Chronic Kidney     Disease.   Anion gap 17 (*) 5 - 15  CBC     Status: None   Collection Time    08/24/13  9:35 AM      Result Value Ref Range   WBC 4.6  4.0 - 10.5 K/uL   RBC 4.28  3.87 - 5.11 MIL/uL   Hemoglobin 12.8  12.0 - 15.0 g/dL   HCT 39.9  36.0 - 46.0 %   MCV 93.2  78.0 - 100.0 fL   MCH 29.9  26.0 - 34.0 pg   MCHC 32.1  30.0 - 36.0 g/dL   RDW 12.6  11.5 - 15.5 %   Platelets 206  150 - 400 K/uL  PROTIME-INR     Status: None   Collection Time    08/24/13  9:35 AM      Result Value Ref Range    Prothrombin Time 13.7  11.6 - 15.2 seconds   INR 1.05  0.00 - 1.49  GLUCOSE, CAPILLARY     Status: Abnormal   Collection Time    09/02/13  8:56 AM      Result Value Ref Range   Glucose-Capillary 214 (*) 70 - 99 mg/dL    Estimated body mass index is 43.75 kg/(m^2) as calculated from the following:   Height as of this encounter: $RemoveBeforeD'5\' 4"'[id number]$  (1.626 m).   Weight as of this encounter: 255 lb (115.667 kg).   Imaging Review Plain radiographs demonstrate severe degenerative joint disease of the right hip(s). The bone quality appears to be good for age and reported activity level.  Assessment/Plan:  End stage arthritis, right hip(s)  The patient history, physical examination, clinical judgement of the provider and imaging  studies are consistent with end stage degenerative joint disease of the right hip(s) and total hip arthroplasty is deemed medically necessary. The treatment options including medical management, injection therapy, arthroscopy and arthroplasty were discussed at length. The risks and benefits of total hip arthroplasty were presented and reviewed. The risks due to aseptic loosening, infection, stiffness, dislocation/subluxation,  thromboembolic complications and other imponderables were discussed.  The patient acknowledged the explanation, agreed to proceed with the plan and consent was signed. Patient is being admitted for inpatient treatment for surgery, pain control, PT, OT, prophylactic antibiotics, VTE prophylaxis, progressive ambulation and ADL's and discharge planning.The patient is planning to be discharged home with home health services

## 2013-09-02 NOTE — Telephone Encounter (Signed)
Ok with me 

## 2013-09-03 LAB — CBC
HCT: 32.9 % — ABNORMAL LOW (ref 36.0–46.0)
Hemoglobin: 10.5 g/dL — ABNORMAL LOW (ref 12.0–15.0)
MCH: 29.6 pg (ref 26.0–34.0)
MCHC: 31.9 g/dL (ref 30.0–36.0)
MCV: 92.7 fL (ref 78.0–100.0)
PLATELETS: 263 10*3/uL (ref 150–400)
RBC: 3.55 MIL/uL — AB (ref 3.87–5.11)
RDW: 13 % (ref 11.5–15.5)
WBC: 8.5 10*3/uL (ref 4.0–10.5)

## 2013-09-03 LAB — BASIC METABOLIC PANEL
ANION GAP: 15 (ref 5–15)
BUN: 18 mg/dL (ref 6–23)
CHLORIDE: 100 meq/L (ref 96–112)
CO2: 25 meq/L (ref 19–32)
Calcium: 8.4 mg/dL (ref 8.4–10.5)
Creatinine, Ser: 0.68 mg/dL (ref 0.50–1.10)
GFR calc Af Amer: >90 mL/min (ref >90)
GFR calc non Af Amer: >90 mL/min (ref >90)
Glucose, Bld: 202 mg/dL — ABNORMAL HIGH (ref 70–99)
Potassium: 4.5 mEq/L (ref 3.7–5.3)
Sodium: 140 mEq/L (ref 137–147)

## 2013-09-03 LAB — GLUCOSE, CAPILLARY
Glucose-Capillary: 284 mg/dL — ABNORMAL HIGH (ref 70–99)
Glucose-Capillary: 213 mg/dL — ABNORMAL HIGH (ref 70–99)

## 2013-09-03 NOTE — Progress Notes (Signed)
Physical Therapy Evaluation Patient Details Name: [name] [name] MRN: [id number] DOB: [birth year] Today's Date: 09/03/2013   History of Present Illness  63 yo female post R hip replacement due to osteoarthritis on 09/02/13. Direct anterior approach, WBAT, no hip precautions.  Clinical Impression  Alert female, ready for therapy, anxious to get out of bed.  Mild overall mobility impairment due to acute surgery.  Stand by assist for bed mobility, transfers and gait.  Contact guard assist for stairs with rail.  Use of FWW for mobility at this time.  Patient has equipment at home from prior surgery, family is able to provide appropriate level of support for transition back to home when medically stable.  PT will continue to follow for mobility for 1-2 more visits.    Follow Up Recommendations Supervision for mobility/OOB    Equipment Recommendations  None recommended by PT (has equipment at home already from prior surgery.)    Recommendations for Other Services       Precautions / Restrictions Precautions Precautions: Fall Precaution Comments: WBAT, no hip precautions Restrictions Weight Bearing Restrictions: Yes RLE Weight Bearing: Weight bearing as tolerated      Mobility  Bed Mobility Overal bed mobility: Needs Assistance Bed Mobility: Supine to Sit;Sit to Supine     Supine to sit: Supervision Sit to supine: Supervision      Transfers Overall transfer level: Modified independent Equipment used: Rolling walker (2 wheeled)                Ambulation/Gait Ambulation/Gait assistance: Supervision Ambulation Distance (Feet): 200 Feet Assistive device: Rolling walker (2 wheeled) Gait Pattern/deviations: WFL(Within Functional Limits)        Stairs Stairs: Yes Stairs assistance: Min guard Stair Management: One rail Right;One rail Left;Step to pattern Number of Stairs: 4    Wheelchair Mobility    Modified Rankin (Stroke Patients Only)       Balance  Overall balance assessment: Modified Independent  Using FWW                                         Pertinent Vitals/Pain "Just bruising", no significant pain even with activity.    Home Living Family/patient expects to be discharged to:: Private residence Living Arrangements: Children Available Help at Discharge: Family;Neighbor;Friend(s) Type of Home: House Home Access: Stairs to enter Entrance Stairs-Rails: Psychiatric nurse of Steps: 2-3 Home Layout: One level Home Equipment: Walker - 2 wheels;Cane - single point;Bedside commode      Prior Function Level of Independence: Independent (used cane due to pain at times)               Hand Dominance        Extremity/Trunk Assessment   Upper Extremity Assessment: Overall WFL for tasks assessed           Lower Extremity Assessment: RLE deficits/detail;Overall WFL for tasks assessed RLE Deficits / Details: Mild weakness due to acute surgery       Communication   Communication: No difficulties  Cognition Arousal/Alertness: Awake/alert Behavior During Therapy: WFL for tasks assessed/performed Overall Cognitive Status: Within Functional Limits for tasks assessed                      General Comments      Exercises Total Joint Exercises Ankle Circles/Pumps: AROM;Both Heel Slides: AAROM;Both;10 reps Hip ABduction/ADduction: AAROM;Both;10 reps  Assessment/Plan    PT Assessment All further PT needs can be met in the next venue of care  PT Diagnosis Difficulty walking   PT Problem List Decreased strength  PT Treatment Interventions     PT Goals (Current goals can be found in the Care Plan section) Acute Rehab PT Goals Patient Stated Goal: To go home PT Goal Formulation: With patient Time For Goal Achievement: 09/06/13 Potential to Achieve Goals: Good    Frequency  (1-2 more visits)   Barriers to discharge        Co-evaluation               End  of Session Equipment Utilized During Treatment: Gait belt Activity Tolerance: Patient tolerated treatment well Patient left: in chair;with family/visitor present Nurse Communication: Mobility status    Functional Limitation: Mobility: Walking and moving around Mobility: Walking and Moving Around Current Status [phone]): At least 1 percent but less than 20 percent impaired, limited or restricted Mobility: Walking and Moving Around Goal Status [phone]): 0 percent impaired, limited or restricted    Time: 0910-1000 PT Time Calculation (min): 50 min   Charges:   PT Evaluation $Initial PT Evaluation Tier I: 1 Procedure PT Treatments $Gait Training: 8-22 mins   PT G Codes:     Functional Limitation: Mobility: Walking and moving around    [place], Amarise Lillo L 09/03/2013, 10:21 AM

## 2013-09-03 NOTE — Progress Notes (Signed)
OT Cancellation Note  Patient Details Name: [name] [name] MRN: [id number] DOB: 06-16-1950   Cancelled Treatment:    Reason Eval/Treat Not Completed: OT screened, no needs identified, will sign off. Pt ambulated 200 ft with RW at Supervision level with PT. Pt has walk-in shower and good support at home. No further ADL concerns. Acute OT to sign off.   Juluis Rainier [phone] 09/03/2013, 3:14 PM

## 2013-09-03 NOTE — Plan of Care (Signed)
Problem: Consults Goal: Diagnosis- Total Joint Replacement Primary Total Hip     

## 2013-09-03 NOTE — Op Note (Signed)
[name] [name], [name] [name]              ACCOUNT NO.:  [id number]  MEDICAL RECORD NO.:  [id number]  LOCATION:  5N15C                        FACILITY:  [facility]  PHYSICIAN:  Alta Corning, M.D.   DATE OF BIRTH:  1950-11-22  DATE OF PROCEDURE:  09/02/2013 DATE OF DISCHARGE:                              OPERATIVE REPORT   PREOPERATIVE DIAGNOSIS:  End-stage degenerative joint disease, right hip.  POSTOPERATIVE DIAGNOSIS:  End-stage degenerative joint disease, right hip.  PROCEDURE:  Right total hip replacement from an anterior approach.  SURGEON:  Alta Corning, MD  ASSISTANT:  Gary Fleet, PA  ANESTHESIA:  General.  BRIEF HISTORY:  [name] [name] is a 63 year old, obese female with a history of having significant complaints of right hip pain.  She had been treated conservatively for a period of time and after failure of all conservative care, she was taken to the operating room for right total hip replacement.  Given her young age, we felt that it was reasonable to consider anterior total hip replacement, and this was chosen as the surgical approach.  DESCRIPTION OF PROCEDURE:  The patient was taken to the operating room. After adequate anesthesia was obtained with general anesthetic, the patient was placed supine on the operating table after she had been placed into the boots and placed on the Hana bed.  Once this was done, intraoperative x-rays were taken, which showed that she had severely arthritic right hip and we get the x-ray of the pelvis which was difficult to do with her size at this point.  She was prepped and draped in usual sterile fashion and following this, an 11 cm incision was made from the anterior aspect of the hip.  Subcutaneous tissue down to the level of tensor fascia, which was identified and protractor device was put in place to hold the skin all the way.  We opened the tensor fascia, took the tensor fascia muscle, retracted it posteriorly, put retractors on  the superior aspect of the neck, dissected with a Cobb off the anterior aspect of the neck and then put a retractor of the anterior aspect.  The blood vessels between the vastus and the tensor were then identified and divided, freeing up the tensor fascia we retracted laterally.  Once this was done, attention was turned to the joint capsule which was opened and tagged and then the drilling corkscrew was used and with the hip unlocked in terms of rotation, it was 1+ traction, we were able to dislocate the hip laterally.  We put it back in place and then under fluoroscopic guidance, we did a provisional neck cut, and then the head was removed.  Attention turned to the acetabulum sequentially reamed up to a level of 51 mm and a 52 porous coated pinnacle cup was used and following this, a +4 neutral liner was put in place.  Excellent positioning of the cup was achieved with 45 degrees of lateral opening and 20 degrees of anteversion.  Once this was done, attention turned to the femoral side, a bit of difficulty with just because of her size, but ultimately we were able to get the hip held up and rotated over and once we did,  we sequentially rasped for up to a level 11, seeded the 11 rasp, calcar planed it, put a 0 ball and checked the length, we were satisfied, this maybe a couple of mm long.  At that point, we took 11 rasp sank it a little bit deeper calcar plane, it went with a minus ball and then got her x-rays at that point, excellent alignment of the hip and leg lengths were within a couple mm.  At this point, the wound was copiously irrigated.  The anterior capsular opening was closed with 1 Vicryl running.  The tensor fascia was then closed with 1 Vicryl running, skin with 0 and 2-0 Vicryl and 3-0 Monocryl subcuticular.  We did use some Exparel mixed with Marcaine to give her good postoperative pain relief. At this point, the patient was taken to recovery room, she was noted to be in  satisfactory condition.  Estimated blood loss for the procedure was 400 mL.     Alta Corning, M.D.     Corliss Skains  D:  09/02/2013  T:  09/03/2013  Job:  [id number]

## 2013-09-03 NOTE — Care Management Note (Signed)
    Page 1 of 1   09/03/2013     3:22:03 PM CARE MANAGEMENT NOTE 09/03/2013  Patient:  [name] [name], [name] [name]   Account Number:  [id number]  Date Initiated:  09/03/2013  Documentation initiated by:  [facility]  Subjective/Objective Assessment:   adm: Right total hip replacement from an anterior approach.     Action/Plan:   discharge planning   Anticipated DC Date:  09/03/2013   Anticipated DC Plan:  [place]  CM consult      Choice offered to / List presented to:             Status of service:  Completed, signed off Medicare Important Message given?   (If response is "NO", the following Medicare IM given date fields will be blank) Date Medicare IM given:   Medicare IM given by:   Date Additional Medicare IM given:   Additional Medicare IM given by:    Discharge Disposition:  HOME/SELF CARE  Per UR Regulation:    If discussed at Long Length of Stay Meetings, dates discussed:    Comments:  09/03/13 15:15 CM spoke with pt in room to discuss home health needs.  PT/OT did not recc. HH followup and pt agreed it was not necessary.  Pt states she has all the DME she needs from previous surgeries.  CM called the RN who confirmed pt not requiring any HH needs.  No other CM needs were communicated.  Mariane Masters, BSN, CM [phone].

## 2013-09-03 NOTE — Progress Notes (Signed)
   PATIENT ID: [name] [name]   1 Day Post-Op Procedure(s) (LRB): RIGHT TOTAL HIP ARTHROPLASTY ANTERIOR APPROACH (Right)  Subjective: Doing well this am. Some difficulty sleeping overnight due to pain, controlled this am. Ready for PT. No other problems or concerns.   Objective:  Filed Vitals:   09/03/13 0533  BP: 135/46  Pulse: 70  Temp: 98.3 F (36.8 C)  Resp: 18     Ant hip dressing c/d/i  Wiggles toes, distally NVI   Labs:   Recent Labs  09/03/13 0554  HGB 10.5*   Recent Labs  09/03/13 0554  WBC 8.5  RBC 3.55*  HCT 32.9*  PLT 263   Recent Labs  09/03/13 0554  NA 140  K 4.5  CL 100  CO2 25  BUN 18  CREATININE 0.68  GLUCOSE 202*  CALCIUM 8.4    Assessment and Plan: 1 day s/p R ant THA  Pain well controlled, cont current pain mgmt  Up with PT today/OT consulted  D/c today if cleared by PT, likely tomorrow    VTE proph: ASA 325mg  BID, SCDs

## 2013-09-03 NOTE — Progress Notes (Signed)
Patient discharged home with family. Prescriptions given.

## 2013-09-04 NOTE — Progress Notes (Signed)
Utilization Review Completed.[name] [name] T7/01/2014  

## 2013-09-05 ENCOUNTER — Encounter (HOSPITAL_COMMUNITY): Payer: Self-pay | Admitting: Orthopedic Surgery

## 2013-09-05 NOTE — Telephone Encounter (Signed)
Called spoke with daughter and did not get a chance today. She will call once she does

## 2013-09-05 NOTE — Discharge Summary (Signed)
Patient ID: [name] [name] MRN: [id number] DOB/AGE: 1950/03/23 63 y.o.  Admit date: 09/02/2013 Discharge date: 09/05/2013  Admission Diagnoses:  Principal Problem:   Osteoarthritis of right hip   Discharge Diagnoses:  Same  Past Medical History  Diagnosis Date  . Hyperlipidemia   . GERD (gastroesophageal reflux disease)   . Cancer, uterine 05/2003    F1G01,G1  . Diabetes 2005  . HTN (hypertension) 2005  . Biliary dyskinesia 07/2006  . Urinary, incontinence, stress female 12/13    using Pessary 01/2012  . Gastroparesis 7/08    Dr. Collene Mares  . Arthritis     Hx: of T/O  . Diverticulosis     Hx: of  . Sleep apnea     CPAP  . Depression     Surgeries: Procedure(s): RIGHT TOTAL HIP ARTHROPLASTY ANTERIOR APPROACH on 09/02/2013   Consultants:    Discharged Condition: Improved  Hospital Course: [name] [name] is an 63 y.o. female who was admitted 09/02/2013 for operative treatment ofOsteoarthritis of right hip. Has a history of pain and functional disability in the right hip(s) due to arthritis and patient has failed non-surgical conservative treatments for greater than 12 weeks to include NSAID's and/or analgesics, flexibility and strengthening excercises, supervised PT with diminished ADL's post treatment, weight reduction as appropriate and activity modification. Onset of symptoms was gradual starting 2 years ago with gradually worsening course since that time.The patient noted no past surgery on the right hip(s). Patient currently rates pain in the right hip at 10 out of 10 with activity. Patient has night pain, worsening of pain with activity and weight bearing, trendelenberg gait, pain that interfers with activities of daily living, pain with passive range of motion, crepitus and joint swelling. Patient has evidence of subchondral sclerosis, periarticular osteophytes and joint space narrowing by imaging studies. This condition presents safety issues increasing the risk of falls.  This patient has had failure of conservative care. There is no current active infection.   After pre-op clearance the patient was taken to the operating room on 09/02/2013 and underwent  Procedure(s): RIGHT TOTAL HIP ARTHROPLASTY ANTERIOR APPROACH.    Patient was given perioperative antibiotics: Anti-infectives   Start     Dose/Rate Route Frequency Ordered Stop   09/02/13 1800  ceFAZolin (ANCEF) IVPB 2 g/50 mL premix     2 g 100 mL/hr over 30 Minutes Intravenous Every 6 hours 09/02/13 1708 09/03/13 0021   09/02/13 0600  ceFAZolin (ANCEF) IVPB 2 g/50 mL premix     2 g 100 mL/hr over 30 Minutes Intravenous On call to O.R. 09/01/13 1410 09/02/13 1150       Patient was given sequential compression devices, early ambulation, and ASA 325mg  BID to prevent DVT.  Patient benefited maximally from hospital stay and there were no complications.    Recent vital signs: No data found.    Recent laboratory studies:  Recent Labs  09/03/13 0554  WBC 8.5  HGB 10.5*  HCT 32.9*  PLT 263  NA 140  K 4.5  CL 100  CO2 25  BUN 18  CREATININE 0.68  GLUCOSE 202*  CALCIUM 8.4     Discharge Medications:     Medication List         aspirin EC 325 MG tablet  Take 1 tablet (325 mg total) by mouth 2 (two) times daily after a meal.     aspirin EC 81 MG tablet  Take 81 mg by mouth daily.     atorvastatin 40 MG  tablet  Commonly known as:  LIPITOR  Take 40 mg by mouth daily.     benazepril 40 MG tablet  Commonly known as:  LOTENSIN  Take 40 mg by mouth daily.     carvedilol 25 MG tablet  Commonly known as:  COREG  Take 25 mg by mouth 2 (two) times daily with a meal.     ergocalciferol 50000 UNITS capsule  Commonly known as:  VITAMIN D2  Take 1 capsule (50,000 Units total) by mouth once a week.     escitalopram 20 MG tablet  Commonly known as:  LEXAPRO  Take 10 mg by mouth daily.     Estradiol 10 MCG Tabs vaginal tablet  Commonly known as:  VAGIFEM  Place 0.5 tablets (5 mcg total)  vaginally 2 (two) times a week. Twice a week - on Sunday and Wednesday     gemfibrozil 600 MG tablet  Commonly known as:  LOPID  Take 600 mg by mouth daily.     LORazepam 0.5 MG tablet  Commonly known as:  ATIVAN  Take 0.5-1 mg by mouth at bedtime as needed for anxiety or sleep.     meloxicam 15 MG tablet  Commonly known as:  MOBIC  Take 15 mg by mouth daily.     metFORMIN 1000 MG tablet  Commonly known as:  GLUCOPHAGE  Take 1,000 mg by mouth 2 (two) times daily with a meal.     methocarbamol 750 MG tablet  Commonly known as:  ROBAXIN-750  One every 8hrs prn spasm.     methocarbamol 750 MG tablet  Commonly known as:  ROBAXIN-750  Take 1 tablet (750 mg total) by mouth every 8 (eight) hours as needed for muscle spasms.     omeprazole 20 MG capsule  Commonly known as:  PRILOSEC  Take 20 mg by mouth 2 (two) times daily before a meal.     oxyCODONE-acetaminophen 5-325 MG per tablet  Commonly known as:  PERCOCET/ROXICET  Take 1-2 tablets by mouth every 4 (four) hours as needed.        Diagnostic Studies: Dg Chest 2 View  08/24/2013   CLINICAL DATA:  Preop for lumbar spine fusion. History of hypertension.  EXAM: CHEST  2 VIEW  COMPARISON:  07/30/2012  FINDINGS: The heart size and mediastinal contours are within normal limits. Both lungs are clear. The bony thorax is demineralized with degenerative changes noted along the thoracic spine, stable.  IMPRESSION: No active cardiopulmonary disease.   Electronically Signed   By: Lajean Manes M.D.   On: 08/24/2013 09:51   Dg Hip Operative Right  09/02/2013   CLINICAL DATA:  Total right hip arthroplasty.  EXAM: DG OPERATIVE RIGHT HIP  TECHNIQUE: A single spot fluoroscopic AP image of the right hip is submitted.  COMPARISON:  None.  FINDINGS: The femoral and acetabular components are well seated. No complicating features.  IMPRESSION: Well seated components of a total right hip arthroplasty.   Electronically Signed   By: Kalman Jewels M.D.    On: 09/02/2013 14:30   Dg Pelvis Portable  09/02/2013   CLINICAL DATA:  Right total hip arthroplasty.  EXAM: PORTABLE PELVIS 1-2 VIEWS  COMPARISON:  None.  FINDINGS: The femoral and acetabular components appear well seated. No complicating features are demonstrated.  IMPRESSION: Well seated components of a total right hip arthroplasty.   Electronically Signed   By: Kalman Jewels M.D.   On: 09/02/2013 15:58   Dg Hip Portable 1 View Right  09/02/2013   CLINICAL DATA:  Right total hip arthroplasty.  EXAM: PORTABLE RIGHT HIP - 1 VIEW  COMPARISON:  Intraoperative films 09/02/2013  FINDINGS: The right hip prosthesis appears well seated without complicating features.  IMPRESSION: Right hip prosthesis in good position without complicating features.   Electronically Signed   By: Kalman Jewels M.D.   On: 09/02/2013 15:58    Disposition: 01-Home or Self Care      Discharge Instructions   Call MD / Call 911    Complete by:  As directed   If you experience chest pain or shortness of breath, CALL 911 and be transported to the hospital emergency room.  If you develope a fever above 101 F, pus (white drainage) or increased drainage or redness at the wound, or calf pain, call your surgeon's office.     Constipation Prevention    Complete by:  As directed   Drink plenty of fluids.  Prune juice may be helpful.  You may use a stool softener, such as Colace (over the counter) 100 mg twice a day.  Use MiraLax (over the counter) for constipation as needed.     Diet - low sodium heart healthy    Complete by:  As directed      Increase activity slowly as tolerated    Complete by:  As directed            Follow-up Information   Follow up with GRAVES,JOHN L, MD. Schedule an appointment as soon as possible for a visit in 2 weeks.   Specialty:  Orthopedic Surgery   Contact information:   [place] [address] [phone]        Signed: Grier Mitts 09/05/2013, 12:22 PM

## 2013-09-06 NOTE — Telephone Encounter (Signed)
Pt's daughter states pt would like to use [facility] for  DME for [phone].  Satira Anis

## 2013-09-06 NOTE — Telephone Encounter (Signed)
Spoke with pt daughter Requests that DME be switched to [facility]. Pt has used in past for physical therapy and [facility] does accept pt's Medicaid insurance.   Order placed to switch DME.  Nothing further needed.

## 2013-09-12 NOTE — Anesthesia Postprocedure Evaluation (Signed)
Anesthesia Post Note  Patient: [name] [name]  Procedure(s) Performed: Procedure(s) (LRB): RIGHT TOTAL HIP ARTHROPLASTY ANTERIOR APPROACH (Right)  Anesthesia type: General  Patient location: PACU  Post pain: Pain level controlled and Adequate analgesia  Post assessment: Post-op Vital signs reviewed, Patient's Cardiovascular Status Stable, Respiratory Function Stable, Patent Airway and Pain level controlled  Last Vitals:  Filed Vitals:   09/03/13 0533  BP: 135/46  Pulse: 70  Temp: 36.8 C  Resp: 18    Post vital signs: Reviewed and stable  Level of consciousness: awake, alert  and oriented  Complications: No apparent anesthesia complications

## 2013-10-03 ENCOUNTER — Ambulatory Visit: Payer: Medicaid Other | Admitting: Nurse Practitioner

## 2013-10-18 ENCOUNTER — Ambulatory Visit (INDEPENDENT_AMBULATORY_CARE_PROVIDER_SITE_OTHER): Payer: Medicaid Other | Admitting: Nurse Practitioner

## 2013-10-18 ENCOUNTER — Encounter: Payer: Self-pay | Admitting: Nurse Practitioner

## 2013-10-18 VITALS — BP 140/68 | HR 60 | Ht 63.5 in | Wt 256.0 lb

## 2013-10-18 DIAGNOSIS — N993 Prolapse of vaginal vault after hysterectomy: Secondary | ICD-10-CM

## 2013-10-18 DIAGNOSIS — R319 Hematuria, unspecified: Secondary | ICD-10-CM

## 2013-10-18 LAB — POCT URINALYSIS DIPSTICK
Bilirubin, UA: NEGATIVE
Glucose, UA: NEGATIVE
KETONES UA: NEGATIVE
Nitrite, UA: NEGATIVE
PH UA: 5
PROTEIN UA: NEGATIVE
Urobilinogen, UA: NEGATIVE

## 2013-10-18 MED ORDER — NITROFURANTOIN MONOHYD MACRO 100 MG PO CAPS: 100.0000 mg | ORAL_CAPSULE | Freq: Two times a day (BID) | ORAL | Status: DC

## 2013-10-18 NOTE — Progress Notes (Signed)
Subjective:   63 y.o. Legally Separated White female G3P3 here for pessary check.  Patient has been using following pessary style and size: #4 ring pessary with support..  She describes the following issues with the pessary: none. She is having some urinary pressure and urgency that has been intermittent since surgery.  She had a foley catheter with her hip replacement.  Past few days symptoms are worse.   Use of protective clothing such as Depends or pads No.. Problems with protective clothing with rash No..  Constipation issues with use of pessary No.  Since last here had right hip replacement on 09/02/13 by Dr. Berenice Primas.     She is not sexually active.     ROS:   No breast pain or new or enlarging lumps on self exam,  no abnormal vaginal bleeding, pelvic pain or discharge.   Positive for - dysuria, hematuria and urinary frequency/urgency since Saturday pm.  Denies fever/ chills, N/V, or unusual back pain. Compliant to use of vaginal cream: Yes.   General Exam:    BP 140/68  Pulse 60  Ht 5' 3.5" (1.613 m)  Wt 256 lb (116.121 kg)  BMI 44.63 kg/m2  General appearance: alert, cooperative and appears stated age   Pelvic: External genitalia:  no lesions   Before pessary was removed No. prolapse over the pessary   In correct position Yes.                Urethra: normal appearing urethra with no masses, tenderness or lesions              Vagina: normal appearing vagina with normal color and discharge, no lesions.  There are No abrasions or ulcerations.               Cervix: absent Cervical lesions were not found   Bimanual Exam:  Uterus:  uterus absent                               Adnexa:    not indicated                  Urine: trace RBC, moderate Leuk's  Pessary was removed with difficulty with using forceps.  Pessary was cleansed with Betadine.  Pessary was replaced. Patient tolerated procedure well.    Assessment :  No contraindication to continuing hormonal contraception,    SUI, Urge  incontinence       R/O UTI - symptomatic   S/P TAH/BSO 05/2003 secondary to endometrial carcinoma   Recent right hip replacement 08/2013 with use of foley catheter with surgery   Use of pessary continued   Plan:    Return to office in 3 months for recheck.         Will start on Macrobid BID until urine culture is returned   Continue with premarin vaginal cream    An After Visit Summary was printed and given to the patient.

## 2013-10-18 NOTE — Patient Instructions (Signed)
We will call with urine results

## 2013-10-19 LAB — URINALYSIS, MICROSCOPIC ONLY
CASTS: NONE SEEN
CRYSTALS: NONE SEEN

## 2013-10-21 ENCOUNTER — Other Ambulatory Visit: Payer: Self-pay | Admitting: Nurse Practitioner

## 2013-10-21 LAB — URINE CULTURE: Colony Count: >=100000

## 2013-10-21 MED ORDER — CIPROFLOXACIN HCL 500 MG PO TABS: 500.0000 mg | ORAL_TABLET | Freq: Two times a day (BID) | ORAL | Status: DC

## 2013-10-23 NOTE — Progress Notes (Signed)
Encounter reviewed by Dr. Lanell Carpenter Silva.  

## 2013-11-02 ENCOUNTER — Ambulatory Visit (INDEPENDENT_AMBULATORY_CARE_PROVIDER_SITE_OTHER): Payer: Medicaid Other | Admitting: Nurse Practitioner

## 2013-11-02 ENCOUNTER — Encounter: Payer: Self-pay | Admitting: Nurse Practitioner

## 2013-11-02 VITALS — BP 140/74 | HR 64 | Ht 63.5 in | Wt 255.0 lb

## 2013-11-02 DIAGNOSIS — N390 Urinary tract infection, site not specified: Secondary | ICD-10-CM

## 2013-11-02 NOTE — Patient Instructions (Signed)
We will call with urine results

## 2013-11-02 NOTE — Progress Notes (Signed)
Subjective:     Patient ID: [name] [name], female   DOB: [birth year], 63 y.o.   MRN: [id number]  HPI  This 63 yo Fe comes for a TOC for Klebsiella UTI from 10/18/13.  She has completed her Cipro therapy.  She has no lower abdominal pressure or dysuria.  She has no fever and chills.  She still has some pain right mid to lower back that she relates to muscular feeling.  She is to have  a straight cath urine for culture secondary to pessary use.   Review of Systems  Constitutional: Negative for fever, chills and fatigue.  Gastrointestinal: Negative for nausea, vomiting, abdominal pain, diarrhea, constipation and abdominal distention.  Genitourinary: Positive for flank pain. Negative for dysuria, urgency, frequency, hematuria, decreased urine volume, vaginal discharge and pelvic pain.  Skin: Negative.   Neurological: Negative.   Psychiatric/Behavioral: Negative.        Objective:   Physical Exam  Constitutional: She is oriented to person, place, and time. She appears well-developed and well-nourished. She appears distressed.  Abdominal: Soft. She exhibits no distension. There is no tenderness. There is no rebound and no guarding.  No flank pain. The area of pain is more muscular skeletal origin.  Genitourinary:  Urine in and out craterization done and tolerated well.  Specimen sent for C&S and micro.  She had additional 250 cc urine.  Neurological: She is alert and oriented to person, place, and time.  Psychiatric: She has a normal mood and affect. Her behavior is normal. Judgment and thought content normal.       Assessment:     UTI - TOC Pessary use for cystocele    Plan:     Call with urine culture results and follow Will be rechecked as usual for pessary in 3 months

## 2013-11-03 LAB — URINALYSIS, MICROSCOPIC ONLY
BACTERIA UA: NONE SEEN
Casts: NONE SEEN
Crystals: NONE SEEN
Squamous Epithelial / LPF: NONE SEEN

## 2013-11-04 LAB — URINE CULTURE
Colony Count: NO GROWTH
Organism ID, Bacteria: NO GROWTH

## 2013-11-06 NOTE — Progress Notes (Signed)
Encounter reviewed by Dr. Brook Silva.  

## 2013-11-29 ENCOUNTER — Encounter (HOSPITAL_BASED_OUTPATIENT_CLINIC_OR_DEPARTMENT_OTHER): Payer: Self-pay | Admitting: Emergency Medicine

## 2013-11-29 ENCOUNTER — Emergency Department (HOSPITAL_BASED_OUTPATIENT_CLINIC_OR_DEPARTMENT_OTHER): Payer: Medicaid Other

## 2013-11-29 ENCOUNTER — Other Ambulatory Visit: Payer: Self-pay

## 2013-11-29 ENCOUNTER — Encounter (HOSPITAL_COMMUNITY)
Admission: EM | Disposition: A | Discharge status: Home / Self Care | Payer: Self-pay | Source: Home / Self Care | Attending: Cardiology

## 2013-11-29 ENCOUNTER — Ambulatory Visit (HOSPITAL_BASED_OUTPATIENT_CLINIC_OR_DEPARTMENT_OTHER)
Admission: EM | Admit: 2013-11-29 | Discharge: 2013-11-30 | Disposition: A | Discharge status: Home / Self Care | Payer: Medicaid Other | Attending: Cardiology | Admitting: Cardiology

## 2013-11-29 DIAGNOSIS — E662 Morbid (severe) obesity with alveolar hypoventilation: Secondary | ICD-10-CM | POA: Insufficient documentation

## 2013-11-29 DIAGNOSIS — R0989 Other specified symptoms and signs involving the circulatory and respiratory systems: Secondary | ICD-10-CM

## 2013-11-29 DIAGNOSIS — Z79899 Other long term (current) drug therapy: Secondary | ICD-10-CM | POA: Insufficient documentation

## 2013-11-29 DIAGNOSIS — I200 Unstable angina: Secondary | ICD-10-CM | POA: Diagnosis present

## 2013-11-29 DIAGNOSIS — I10 Essential (primary) hypertension: Secondary | ICD-10-CM | POA: Diagnosis not present

## 2013-11-29 DIAGNOSIS — Z7982 Long term (current) use of aspirin: Secondary | ICD-10-CM | POA: Insufficient documentation

## 2013-11-29 DIAGNOSIS — Z8249 Family history of ischemic heart disease and other diseases of the circulatory system: Secondary | ICD-10-CM | POA: Diagnosis not present

## 2013-11-29 DIAGNOSIS — Z791 Long term (current) use of non-steroidal anti-inflammatories (NSAID): Secondary | ICD-10-CM | POA: Diagnosis not present

## 2013-11-29 DIAGNOSIS — R0600 Dyspnea, unspecified: Secondary | ICD-10-CM

## 2013-11-29 DIAGNOSIS — K219 Gastro-esophageal reflux disease without esophagitis: Secondary | ICD-10-CM | POA: Insufficient documentation

## 2013-11-29 DIAGNOSIS — E119 Type 2 diabetes mellitus without complications: Secondary | ICD-10-CM | POA: Insufficient documentation

## 2013-11-29 DIAGNOSIS — R0789 Other chest pain: Secondary | ICD-10-CM | POA: Diagnosis not present

## 2013-11-29 DIAGNOSIS — Z9989 Dependence on other enabling machines and devices: Secondary | ICD-10-CM | POA: Diagnosis not present

## 2013-11-29 DIAGNOSIS — R0609 Other forms of dyspnea: Secondary | ICD-10-CM

## 2013-11-29 DIAGNOSIS — E785 Hyperlipidemia, unspecified: Secondary | ICD-10-CM | POA: Diagnosis not present

## 2013-11-29 DIAGNOSIS — R079 Chest pain, unspecified: Secondary | ICD-10-CM

## 2013-11-29 DIAGNOSIS — I209 Angina pectoris, unspecified: Secondary | ICD-10-CM | POA: Diagnosis present

## 2013-11-29 HISTORY — DX: Obstructive sleep apnea (adult) (pediatric): G47.33

## 2013-11-29 HISTORY — PX: LEFT HEART CATHETERIZATION WITH CORONARY ANGIOGRAM: SHX5451

## 2013-11-29 HISTORY — DX: Type 2 diabetes mellitus without complications: E11.9

## 2013-11-29 HISTORY — DX: Obstructive sleep apnea (adult) (pediatric): Z99.89

## 2013-11-29 LAB — CBC WITH DIFFERENTIAL/PLATELET
BASOS ABS: 0 10*3/uL (ref 0.0–0.1)
Basophils Relative: 1 % (ref 0–1)
EOS ABS: 0.2 10*3/uL (ref 0.0–0.7)
Eosinophils Relative: 4 % (ref 0–5)
HCT: 36 % (ref 36.0–46.0)
Hemoglobin: 11.6 g/dL — ABNORMAL LOW (ref 12.0–15.0)
LYMPHS ABS: 1.3 10*3/uL (ref 0.7–4.0)
LYMPHS PCT: 26 % (ref 12–46)
MCH: 29.4 pg (ref 26.0–34.0)
MCHC: 32.2 g/dL (ref 30.0–36.0)
MCV: 91.4 fL (ref 78.0–100.0)
Monocytes Absolute: 0.5 10*3/uL (ref 0.1–1.0)
Monocytes Relative: 10 % (ref 3–12)
NEUTROS PCT: 59 % (ref 43–77)
Neutro Abs: 3 10*3/uL (ref 1.7–7.7)
PLATELETS: 235 10*3/uL (ref 150–400)
RBC: 3.94 MIL/uL (ref 3.87–5.11)
RDW: 13.1 % (ref 11.5–15.5)
WBC: 5 10*3/uL (ref 4.0–10.5)

## 2013-11-29 LAB — COMPREHENSIVE METABOLIC PANEL
ALT: 25 U/L (ref 0–35)
AST: 19 U/L (ref 0–37)
Albumin: 3.9 g/dL (ref 3.5–5.2)
Alkaline Phosphatase: 75 U/L (ref 39–117)
Anion gap: 14 (ref 5–15)
BUN: 18 mg/dL (ref 6–23)
CO2: 26 meq/L (ref 19–32)
Calcium: 9.7 mg/dL (ref 8.4–10.5)
Chloride: 101 mEq/L (ref 96–112)
Creatinine, Ser: 0.7 mg/dL (ref 0.50–1.10)
GFR calc Af Amer: >90 mL/min (ref >90)
GFR calc non Af Amer: >90 mL/min (ref >90)
Glucose, Bld: 111 mg/dL — ABNORMAL HIGH (ref 70–99)
Potassium: 4.1 mEq/L (ref 3.7–5.3)
SODIUM: 141 meq/L (ref 137–147)
TOTAL PROTEIN: 7.5 g/dL (ref 6.0–8.3)
Total Bilirubin: 0.4 mg/dL (ref 0.3–1.2)

## 2013-11-29 LAB — PROTIME-INR
INR: 1 (ref 0.00–1.49)
Prothrombin Time: 13.2 seconds (ref 11.6–15.2)

## 2013-11-29 LAB — GLUCOSE, CAPILLARY: GLUCOSE-CAPILLARY: 78 mg/dL (ref 70–99)

## 2013-11-29 LAB — TROPONIN I: Troponin I: <0.3 ng/mL (ref <0.30)

## 2013-11-29 LAB — APTT: APTT: 29 s (ref 24–37)

## 2013-11-29 SURGERY — LEFT HEART CATHETERIZATION WITH CORONARY ANGIOGRAM
Anesthesia: LOCAL

## 2013-11-29 MED ORDER — NITROGLYCERIN 1 MG/10 ML FOR IR/CATH LAB
INTRA_ARTERIAL | Status: AC
  Filled 2013-11-29: qty 10

## 2013-11-29 MED ORDER — ESCITALOPRAM OXALATE 10 MG PO TABS
10.0000 mg | ORAL_TABLET | Freq: Every day | ORAL | Status: DC
  Administered 2013-11-30: 10 mg via ORAL
  Filled 2013-11-29: qty 1

## 2013-11-29 MED ORDER — SODIUM CHLORIDE 0.9 % IJ SOLN: 3.0000 mL | Freq: Two times a day (BID) | INTRAMUSCULAR | Status: DC

## 2013-11-29 MED ORDER — CARVEDILOL 25 MG PO TABS
25.0000 mg | ORAL_TABLET | Freq: Two times a day (BID) | ORAL | Status: DC
  Administered 2013-11-30: 08:00:00 25 mg via ORAL
  Filled 2013-11-29 (×3): qty 1

## 2013-11-29 MED ORDER — SODIUM CHLORIDE 0.9 % IV SOLN: 250.0000 mL | INTRAVENOUS | Status: DC | PRN

## 2013-11-29 MED ORDER — ACETAMINOPHEN 325 MG PO TABS
650.0000 mg | ORAL_TABLET | ORAL | Status: DC | PRN
  Administered 2013-11-29: 650 mg via ORAL
  Filled 2013-11-29 (×2): qty 2

## 2013-11-29 MED ORDER — ASPIRIN EC 81 MG PO TBEC
81.0000 mg | DELAYED_RELEASE_TABLET | Freq: Every day | ORAL | Status: DC
  Filled 2013-11-29: qty 1

## 2013-11-29 MED ORDER — SODIUM CHLORIDE 0.9 % IJ SOLN
3.0000 mL | INTRAMUSCULAR | Status: DC | PRN
Start: 2013-11-29 — End: 2013-11-30

## 2013-11-29 MED ORDER — HEPARIN (PORCINE) IN NACL 2-0.9 UNIT/ML-% IJ SOLN
INTRAMUSCULAR | Status: AC
  Filled 2013-11-29: qty 1000

## 2013-11-29 MED ORDER — VERAPAMIL HCL 2.5 MG/ML IV SOLN
INTRAVENOUS | Status: AC
  Filled 2013-11-29: qty 2

## 2013-11-29 MED ORDER — MORPHINE SULFATE 4 MG/ML IJ SOLN
4.0000 mg | Freq: Once | INTRAMUSCULAR | Status: AC
  Administered 2013-11-29: 4 mg via INTRAVENOUS
  Filled 2013-11-29: qty 1

## 2013-11-29 MED ORDER — SODIUM CHLORIDE 0.9 % IJ SOLN: 3.0000 mL | INTRAMUSCULAR | Status: DC | PRN

## 2013-11-29 MED ORDER — NITROGLYCERIN IN D5W 200-5 MCG/ML-% IV SOLN
0.0000 ug/min | INTRAVENOUS | Status: DC
  Administered 2013-11-29: 10 ug/min via INTRAVENOUS
  Filled 2013-11-29: qty 250

## 2013-11-29 MED ORDER — HEPARIN SODIUM (PORCINE) 1000 UNIT/ML IJ SOLN
INTRAMUSCULAR | Status: AC
  Filled 2013-11-29: qty 1

## 2013-11-29 MED ORDER — HYDRALAZINE HCL 20 MG/ML IJ SOLN
10.0000 mg | INTRAMUSCULAR | Status: DC | PRN
  Administered 2013-11-29 – 2013-11-30 (×3): 10 mg via INTRAVENOUS
  Filled 2013-11-29 (×4): qty 1

## 2013-11-29 MED ORDER — DIAZEPAM 2 MG PO TABS: 2.0000 mg | ORAL_TABLET | Freq: Four times a day (QID) | ORAL | Status: DC | PRN

## 2013-11-29 MED ORDER — ASPIRIN 300 MG RE SUPP
300.0000 mg | RECTAL | Status: DC
Start: 2013-11-29 — End: 2013-11-29

## 2013-11-29 MED ORDER — ONDANSETRON HCL 4 MG/2ML IJ SOLN
4.0000 mg | Freq: Four times a day (QID) | INTRAMUSCULAR | Status: DC | PRN
  Administered 2013-11-30: 4 mg via INTRAVENOUS
  Filled 2013-11-29: qty 2

## 2013-11-29 MED ORDER — SODIUM CHLORIDE 0.9 % IV SOLN
INTRAVENOUS | Status: AC
  Administered 2013-11-29: 20:00:00 via INTRAVENOUS

## 2013-11-29 MED ORDER — ATORVASTATIN CALCIUM 40 MG PO TABS
40.0000 mg | ORAL_TABLET | Freq: Every day | ORAL | Status: DC
  Filled 2013-11-29 (×2): qty 1

## 2013-11-29 MED ORDER — CARVEDILOL 6.25 MG PO TABS
6.2500 mg | ORAL_TABLET | Freq: Two times a day (BID) | ORAL | Status: DC
  Filled 2013-11-29 (×2): qty 1

## 2013-11-29 MED ORDER — ASPIRIN 81 MG PO CHEW: 324.0000 mg | CHEWABLE_TABLET | ORAL | Status: DC

## 2013-11-29 MED ORDER — HEPARIN BOLUS VIA INFUSION
4000.0000 [IU] | Freq: Once | INTRAVENOUS | Status: AC
  Administered 2013-11-29: 4000 [IU] via INTRAVENOUS

## 2013-11-29 MED ORDER — INSULIN ASPART 100 UNIT/ML ~~LOC~~ SOLN
0.0000 [IU] | Freq: Three times a day (TID) | SUBCUTANEOUS | Status: DC
  Administered 2013-11-30: 07:00:00 3 [IU] via SUBCUTANEOUS

## 2013-11-29 MED ORDER — PANTOPRAZOLE SODIUM 40 MG PO TBEC
40.0000 mg | DELAYED_RELEASE_TABLET | Freq: Every day | ORAL | Status: DC
  Administered 2013-11-30: 10:00:00 40 mg via ORAL
  Filled 2013-11-29: qty 1

## 2013-11-29 MED ORDER — HEPARIN (PORCINE) IN NACL 100-0.45 UNIT/ML-% IJ SOLN
1000.0000 [IU]/h | INTRAMUSCULAR | Status: DC
  Administered 2013-11-29: 1000 [IU]/h via INTRAVENOUS
  Filled 2013-11-29: qty 250

## 2013-11-29 MED ORDER — SODIUM CHLORIDE 0.9 % IV SOLN: INTRAVENOUS | Status: DC

## 2013-11-29 MED ORDER — MIDAZOLAM HCL 2 MG/2ML IJ SOLN
INTRAMUSCULAR | Status: AC
  Filled 2013-11-29: qty 2

## 2013-11-29 MED ORDER — ASPIRIN EC 81 MG PO TBEC
81.0000 mg | DELAYED_RELEASE_TABLET | Freq: Every day | ORAL | Status: DC
Start: 2013-11-30 — End: 2013-11-30
  Administered 2013-11-30: 10:00:00 81 mg via ORAL
  Filled 2013-11-29: qty 1

## 2013-11-29 MED ORDER — HYDROMORPHONE HCL 1 MG/ML IJ SOLN
INTRAMUSCULAR | Status: AC
  Filled 2013-11-29: qty 1

## 2013-11-29 MED ORDER — BENAZEPRIL HCL 40 MG PO TABS
40.0000 mg | ORAL_TABLET | Freq: Every day | ORAL | Status: DC
  Administered 2013-11-30: 40 mg via ORAL
  Filled 2013-11-29: qty 1

## 2013-11-29 MED ORDER — LIDOCAINE HCL (PF) 1 % IJ SOLN
INTRAMUSCULAR | Status: AC
  Filled 2013-11-29: qty 30

## 2013-11-29 MED ORDER — NITROGLYCERIN 0.4 MG SL SUBL
0.4000 mg | SUBLINGUAL_TABLET | SUBLINGUAL | Status: AC | PRN
Start: 2013-11-29 — End: 2013-11-30
  Administered 2013-11-29 – 2013-11-30 (×3): 0.4 mg via SUBLINGUAL
  Filled 2013-11-29 (×2): qty 1

## 2013-11-29 NOTE — ED Notes (Signed)
C/o chest pain started with tightness in mid chest on Friday. C/o nausea and also c/o upper back. No vomiting or diaphoresis.

## 2013-11-29 NOTE — ED Notes (Signed)
Report given to [facility] RN HP 1.

## 2013-11-29 NOTE — ED Provider Notes (Signed)
Medical screening examination/treatment/procedure(s) were conducted as a shared visit with non-physician practitioner(s) and myself.  I personally evaluated the patient during the encounter.  Patient with history of HTN, DM, Hypercholesterolemia presents with chest discomfort and DOE that has been occurring for the past several days.  No prior CAD, but multiple risk factors.  On exam, vitals and stable and she is afebrile.  Head is AT, [place]. Neck is supple.  Heart is rrr and lungs are clear.  Abd is benign.  There is no edema.  NTG drip and heparin started.  Patient has multiple risk factors and history concerning for cad.  Workup reveals an unchanged ekg and negative troponin.  Pain improved with ntg.  Case discussed with Dr. Einar Gip who recommends admission to [facility].     EKG Interpretation   Date/Time:  Tuesday November 29 2013 11:48:34 EDT Ventricular Rate:  63 PR Interval:  168 QRS Duration: 90 QT Interval:  406 QTC Calculation: 415 R Axis:   70 Text Interpretation:  Normal sinus rhythm Nonspecific ST abnormality  Abnormal ECG No change since 08/24/2013 Confirmed by Beau Fanny  MD, Benson Porcaro  ([id number]) on 11/29/2013 3:14:43 PM       Veryl Speak, MD 11/29/13 2259

## 2013-11-29 NOTE — ED Notes (Signed)
Report called to [facility] cath lab to [place] to let them know HP 1 Has arrived at [facility] to transport pt,

## 2013-11-29 NOTE — ED Notes (Signed)
Pt updated on care. Cardiologist wants pt to go directly to the cath lab.

## 2013-11-29 NOTE — H&P (Signed)
[name] [name] is an 63 y.o. female.   Chief Complaint: Chest pain HPI: Patient is a 63 year old Caucasian female with history of hypertension, hyperlipidemia, morbid obesity, obstructive sleep apnea, diabetes mellitus and family history of premature coronary artery disease had been doing well until one week ago started noticing chest tightness. She had on-and-off episodes, lasting several minutes with a without exertional activities. Due to worsening symptoms today, with radiation to the back, she went to [facility] emergency department where she was ruled out for myocardial infarction by serial troponin, however due to her multiple cardiovascular risk factors and ongoing chest discomfort, patient was started on IV nitroglycerin and transferred to [facility] for further evaluation and therapy. Chest pain is described as chest tightness with radiation to the back, associated with nausea but no vomiting. Chest pain was on and off total last entire week and states that taking deep breath would hurt just even more but also states that exertion would worsen the chest pain. This morning she had chest tightness that would not go away and hence presented to the emergency room.    Past Medical History  Diagnosis Date  . Hyperlipidemia   . GERD (gastroesophageal reflux disease)   . Cancer, uterine 05/2003    F1G01,G1  . Diabetes 2005  . HTN (hypertension) 2005  . Biliary dyskinesia 07/2006  . Urinary, incontinence, stress female 12/13    using Pessary 01/2012  . Gastroparesis 7/08    Dr. Collene Mares  . Arthritis     Hx: of T/O  . Diverticulosis     Hx: of  . Sleep apnea     CPAP  . Depression     Past Surgical History  Procedure Laterality Date  . Tubal ligation    . Knee arthroscopy  2010    left  . Total abdominal hysterectomy w/ bilateral salpingoophorectomy  05/2003    endometrial carcinoma F1G01, G1  . Cholecystectomy, laparoscopic  10/2006  . Colonoscopy w/ biopsies   10/04    polyp  . Colonoscopy w/ biopsies  02/12/05    benign polyp  . Colonoscopy with esophagogastroduodenoscopy (egd)  09/14/06    2 polyps, diverticula, diffuse gastritis  . Colonoscopy w/ biopsies  10/26/09    1 polyp benign, recheck 5 years  . Total knee arthroplasty Left 08/02/2012    Procedure: TOTAL KNEE ARTHROPLASTY;  Surgeon: Alta Corning, MD;  Location: [place];  Service: Orthopedics;  Laterality: Left;  left total knee arthroplasty  . Knee arthroscopy Right 11/2012  . Total hip arthroplasty Right 09/02/2013    Procedure: RIGHT TOTAL HIP ARTHROPLASTY ANTERIOR APPROACH;  Surgeon: Alta Corning, MD;  Location: [place];  Service: Orthopedics;  Laterality: Right;  . Abdominal hysterectomy      Family History  Problem Relation Age of Onset  . Heart disease Father 46    CABG  . Lung cancer Father 88    lung  . Hypertension Father   . Diabetes Father   . Heart disease Sister 69    CABG  . Diabetes Sister 20  . Heart disease Brother 39    MI with stents  . Pancreatic cancer Other     x 3 - 2 MAunts and 1 P Aunts.   . Lupus Other     Pat Aunt  . Hypertension Mother   . Diabetes Mother   . Hypertension Sister     x2   . Diabetes Sister   . Hypertension Brother   .  Diabetes Brother    Social History:  reports that she has never smoked. She has never used smokeless tobacco. She reports that she does not drink alcohol or use illicit drugs.  Allergies:  Allergies  Allergen Reactions  . Codeine Palpitations     Review of Systems - patient has GERD and also occasional episodes of constipation. She has diabetes mellitus and states that it is well controlled. Denies any neurologic deficits. Has chronic dyspnea but no PND or orthopnea, no leg edema or hemoptysis. No recent weight changes. Has sleep apnea and uses CPAP regularly. No recent surgery, was treated for UTI a month ago.  Blood pressure 161/71, pulse 61, temperature 98.3 F (36.8 C), temperature source Oral, resp. rate  16, SpO2 98.00%. General appearance: alert, cooperative, appears older than stated age, no distress and morbidly obese Eyes: negative findings: conjunctivae and sclerae normal Neck: no adenopathy, no carotid bruit, supple, symmetrical, trachea midline, thyroid not enlarged, symmetric, no tenderness/mass/nodules and Short neck Neck: JVP - normal, carotids 2+= without bruits Resp: clear to auscultation bilaterally Chest wall: no tenderness Cardio: regular rate and rhythm, S1, S2 normal, no murmur, click, rub or gallop GI: soft, non-tender; bowel sounds normal; no masses,  no organomegaly and Pannus present Extremities: extremities normal, atraumatic, no cyanosis or edema Pulses: 2+ and symmetric Skin: Skin color, texture, turgor normal. No rashes or lesions Neurologic: Grossly normal  Results for orders placed during the hospital encounter of 11/29/13 (from the past 48 hour(s))  CBC WITH DIFFERENTIAL     Status: Abnormal   Collection Time    11/29/13 12:00 PM      Result Value Ref Range   WBC 5.0  4.0 - 10.5 K/uL   RBC 3.94  3.87 - 5.11 MIL/uL   Hemoglobin 11.6 (*) 12.0 - 15.0 g/dL   HCT 36.0  36.0 - 46.0 %   MCV 91.4  78.0 - 100.0 fL   MCH 29.4  26.0 - 34.0 pg   MCHC 32.2  30.0 - 36.0 g/dL   RDW 13.1  11.5 - 15.5 %   Platelets 235  150 - 400 K/uL   Neutrophils Relative % 59  43 - 77 %   Neutro Abs 3.0  1.7 - 7.7 K/uL   Lymphocytes Relative 26  12 - 46 %   Lymphs Abs 1.3  0.7 - 4.0 K/uL   Monocytes Relative 10  3 - 12 %   Monocytes Absolute 0.5  0.1 - 1.0 K/uL   Eosinophils Relative 4  0 - 5 %   Eosinophils Absolute 0.2  0.0 - 0.7 K/uL   Basophils Relative 1  0 - 1 %   Basophils Absolute 0.0  0.0 - 0.1 K/uL  COMPREHENSIVE METABOLIC PANEL     Status: Abnormal   Collection Time    11/29/13 12:00 PM      Result Value Ref Range   Sodium 141  137 - 147 mEq/L   Potassium 4.1  3.7 - 5.3 mEq/L   Chloride 101  96 - 112 mEq/L   CO2 26  19 - 32 mEq/L   Glucose, Bld 111 (*) 70 - 99  mg/dL   BUN 18  6 - 23 mg/dL   Creatinine, Ser 0.70  0.50 - 1.10 mg/dL   Calcium 9.7  8.4 - 10.5 mg/dL   Total Protein 7.5  6.0 - 8.3 g/dL   Albumin 3.9  3.5 - 5.2 g/dL   AST 19  0 - 37 U/L  ALT 25  0 - 35 U/L   Alkaline Phosphatase 75  39 - 117 U/L   Total Bilirubin 0.4  0.3 - 1.2 mg/dL   GFR calc non Af Amer >90  >90 mL/min   GFR calc Af Amer >90  >90 mL/min   Comment: (NOTE)     The eGFR has been calculated using the CKD EPI equation.     This calculation has not been validated in all clinical situations.     eGFR's persistently <90 mL/min signify possible Chronic Kidney     Disease.   Anion gap 14  5 - 15  TROPONIN I     Status: None   Collection Time    11/29/13 12:00 PM      Result Value Ref Range   Troponin I <0.30  <0.30 ng/mL   Comment:            Due to the release kinetics of cTnI,     a negative result within the first hours     of the onset of symptoms does not rule out     myocardial infarction with certainty.     If myocardial infarction is still suspected,     repeat the test at appropriate intervals.  PROTIME-INR     Status: None   Collection Time    11/29/13  2:55 PM      Result Value Ref Range   Prothrombin Time 13.2  11.6 - 15.2 seconds   INR 1.00  0.00 - 1.49  APTT     Status: None   Collection Time    11/29/13  2:55 PM      Result Value Ref Range   aPTT 29  24 - 37 seconds   Dg Chest 2 View  11/29/2013   CLINICAL DATA:  Chest pain and shortness of breath of acute onset; history of uterine carcinoma  EXAM: CHEST  2 VIEW  COMPARISON:  August 24, 2013  FINDINGS: There is mild atelectasis in the left base. Lungs elsewhere clear. Heart is the borderline enlarged with pulmonary vascularity within normal limits. No adenopathy. No bone lesions. No pneumothorax.  IMPRESSION: Mild cardiac enlargement. Mild atelectatic change left base. No edema or consolidation.   Electronically Signed   By: Bretta Bang M.D.   On: 11/29/2013 13:17   Scheduled Meds: . [facility]  HOLD] aspirin  324 mg Oral NOW   Or  . [MAR HOLD] aspirin  300 mg Rectal NOW  . [facility] HOLD] aspirin EC  81 mg Oral Daily  . [facility] HOLD] atorvastatin  40 mg Oral q1800  . [facility] HOLD] carvedilol  6.25 mg Oral BID WC   Continuous Infusions: . heparin 1,000 Units/hr (11/29/13 1449)  . nitroGLYCERIN 25 mcg/min (11/29/13 1518)   PRN Meds:.[MAR HOLD] acetaminophen, [MAR HOLD] nitroGLYCERIN, [MAR HOLD] ondansetron (ZOFRAN) IV   Labs:   Lab Results  Component Value Date   WBC 5.0 11/29/2013   HGB 11.6* 11/29/2013   HCT 36.0 11/29/2013   MCV 91.4 11/29/2013   PLT 235 11/29/2013    Recent Labs Lab 11/29/13 1200  NA 141  K 4.1  CL 101  CO2 26  BUN 18  CREATININE 0.70  CALCIUM 9.7  PROT 7.5  BILITOT 0.4  ALKPHOS 75  ALT 25  AST 19  GLUCOSE 111*   Lab Results  Component Value Date   TROPONINI <0.30 11/29/2013    Lipid Panel  No results found for this basename: chol, trig, hdl, cholhdl, vldl,  ldlcalc  Lipid profile 01/03/11: TC 209, TG 224, HDL 46, LDL 118, non-HDL 163  Past Medical History  Diagnosis Date  . Hyperlipidemia   . GERD (gastroesophageal reflux disease)   . Cancer, uterine 05/2003    F1G01,G1  . Diabetes 2005  . HTN (hypertension) 2005  . Biliary dyskinesia 07/2006  . Urinary, incontinence, stress female 12/13    using Pessary 01/2012  . Gastroparesis 7/08    Dr. Collene Mares  . Arthritis     Hx: of T/O  . Diverticulosis     Hx: of  . Sleep apnea     CPAP  . Depression      EKG: 11/29/2013: Normal sinus rhythm at a rate of 63 beats a minute, normal intervals, no evidence of ischemia. Normal EKG. Baseline artifact.  Assessment/Plan 1. Chest pain utmost atypical for angina pectoris, patient has multiple cardiovascular risk factors. Patient has had last stress test in 2012 and was told to be normal. 2. Hypertension 3. Hyperlipidemia  4. Diabetes mellitus 5. Morbid obesity with obesity hypoventilation 6. Obstructive sleep apnea, compliant with  CPAP  Recommendation: Chest pain symptoms appear to be atypical utmost. However patient did get relief of chest pain with intravenous nitroglycerin hence in this patient with multiple cardiovascular risk factors unstable angina is still likely, due to continued chest discomfort, we have opted to proceed with coronary angiography. Patient aware of the risk, benefits and alternatives to coronary angiography including but not limited to less than 1% risk of that, stroke, MI, bleeding, infection but not limited to these and is willing to proceed. Further condition after angiography.  Laverda Page, MD 11/29/2013, 6:17 PM [facility]. [place] Pager: [phone] Office: [phone] If no answer: Cell:  [phone]

## 2013-11-29 NOTE — Interval H&P Note (Signed)
History and Physical Interval Note:  11/29/2013 6:35 PM  [name] [name]  has presented today for surgery, with the diagnosis of cp  The various methods of treatment have been discussed with the patient and family. After consideration of risks, benefits and other options for treatment, the patient has consented to  Procedure(s): LEFT HEART CATHETERIZATION WITH CORONARY ANGIOGRAM (N/A) and possible PCI as a surgical intervention .  The patient's history has been reviewed, patient examined, no change in status, stable for surgery.  I have reviewed the patient's chart and labs.  Questions were answered to the patient's satisfaction.   Cath Lab Visit (complete for each Cath Lab visit)  Clinical Evaluation Leading to the Procedure:   ACS: Yes.    Non-ACS:    Anginal Classification: CCS IV  Anti-ischemic medical therapy: Maximal Therapy (2 or more classes of medications)  Non-Invasive Test Results: No non-invasive testing performed  Prior CABG: No previous CABG        [facility]

## 2013-11-29 NOTE — ED Notes (Signed)
Report called to Tammy RN in cath lab at [facility]

## 2013-11-29 NOTE — ED Notes (Signed)
Increased NTG to 6cc/hr for c/p

## 2013-11-29 NOTE — ED Provider Notes (Signed)
CSN: [id number]     Arrival date & time 11/29/13  1142 History   First MD Initiated Contact with Patient 11/29/13 1208     Chief Complaint  Patient presents with  . Chest Pain     (Consider location/radiation/quality/duration/timing/severity/associated sxs/prior Treatment) Patient is a 63 y.o. female presenting with chest pain. The history is provided by the patient. No language interpreter was used.  Chest Pain Pain location:  Substernal area and L chest Pain quality: tightness   Pain radiates to:  Upper back Pain radiates to the back: yes   Pain severity:  Moderate Onset quality:  Sudden Timing:  Constant Progression:  Worsening Chronicity:  New Context: not breathing   Relieved by:  Nothing Worsened by:  Nothing tried Ineffective treatments:  None tried Associated symptoms: no abdominal pain, no dizziness and no nausea     Past Medical History  Diagnosis Date  . Hyperlipidemia   . GERD (gastroesophageal reflux disease)   . Cancer, uterine 05/2003    F1G01,G1  . Diabetes 2005  . HTN (hypertension) 2005  . Biliary dyskinesia 07/2006  . Urinary, incontinence, stress female 12/13    using Pessary 01/2012  . Gastroparesis 7/08    Dr. Collene Mares  . Arthritis     Hx: of T/O  . Diverticulosis     Hx: of  . Sleep apnea     CPAP  . Depression    Past Surgical History  Procedure Laterality Date  . Tubal ligation    . Knee arthroscopy  2010    left  . Total abdominal hysterectomy w/ bilateral salpingoophorectomy  05/2003    endometrial carcinoma F1G01, G1  . Cholecystectomy, laparoscopic  10/2006  . Colonoscopy w/ biopsies  10/04    polyp  . Colonoscopy w/ biopsies  02/12/05    benign polyp  . Colonoscopy with esophagogastroduodenoscopy (egd)  09/14/06    2 polyps, diverticula, diffuse gastritis  . Colonoscopy w/ biopsies  10/26/09    1 polyp benign, recheck 5 years  . Total knee arthroplasty Left 08/02/2012    Procedure: TOTAL KNEE ARTHROPLASTY;  Surgeon: Alta Corning, MD;   Location: [place] [facility];  Service: Orthopedics;  Laterality: Left;  left total knee arthroplasty  . Knee arthroscopy Right 11/2012  . Total hip arthroplasty Right 09/02/2013    Procedure: RIGHT TOTAL HIP ARTHROPLASTY ANTERIOR APPROACH;  Surgeon: Alta Corning, MD;  Location: [place];  Service: Orthopedics;  Laterality: Right;  . Abdominal hysterectomy     Family History  Problem Relation Age of Onset  . Heart disease Father 54    CABG  . Lung cancer Father 26    lung  . Hypertension Father   . Diabetes Father   . Heart disease Sister 95    CABG  . Diabetes Sister 32  . Heart disease Brother 14    MI with stents  . Pancreatic cancer Other     x 3 - 2 MAunts and 1 P Aunts.   . Lupus Other     Pat Aunt  . Hypertension Mother   . Diabetes Mother   . Hypertension Sister     x2   . Diabetes Sister   . Hypertension Brother   . Diabetes Brother    History  Substance Use Topics  . Smoking status: Never Smoker   . Smokeless tobacco: Never Used  . Alcohol Use: No   OB History   Grav Para Term Preterm Abortions TAB SAB Ect Mult Living  3 3 3             Review of Systems  Cardiovascular: Positive for chest pain.  Gastrointestinal: Negative for nausea and abdominal pain.  Neurological: Negative for dizziness.  All other systems reviewed and are negative.     Allergies  Codeine  Home Medications   Prior to Admission medications   Medication Sig Start Date End Date Taking? Authorizing Provider  aspirin EC 81 MG tablet Take 81 mg by mouth daily.   Yes Historical Provider, MD  atorvastatin (LIPITOR) 40 MG tablet Take 40 mg by mouth daily.   Yes Historical Provider, MD  benazepril (LOTENSIN) 40 MG tablet Take 40 mg by mouth daily.   Yes Historical Provider, MD  carvedilol (COREG) 25 MG tablet Take 25 mg by mouth 2 (two) times daily with a meal.   Yes Historical Provider, MD  ergocalciferol (VITAMIN D2) 50000 UNITS capsule Take 1 capsule (50,000 Units total) by mouth once a week.  04/25/13  Yes Patricia Rolen-Grubb, FNP  escitalopram (LEXAPRO) 20 MG tablet Take 10 mg by mouth daily.   Yes Historical Provider, MD  Estradiol (VAGIFEM) 10 MCG TABS vaginal tablet Place 0.5 tablets (5 mcg total) vaginally 2 (two) times a week. Twice a week - on Sunday and Wednesday 04/25/13  Yes Mardene Celeste Rolen-Grubb, FNP  meloxicam (MOBIC) 15 MG tablet Take 15 mg by mouth daily.   Yes Historical Provider, MD  metFORMIN (GLUCOPHAGE) 1000 MG tablet Take 1,000 mg by mouth 2 (two) times daily with a meal.   Yes Historical Provider, MD  omeprazole (PRILOSEC) 20 MG capsule Take 20 mg by mouth 2 (two) times daily before a meal.    Yes Historical Provider, MD  gemfibrozil (LOPID) 600 MG tablet Take 600 mg by mouth daily.     Historical Provider, MD   There were no vitals taken for this visit. Physical Exam  Nursing note and vitals reviewed. Constitutional: She is oriented to person, place, and time. She appears well-developed and well-nourished.  HENT:  Head: Normocephalic and atraumatic.  Right Ear: External ear normal.  Left Ear: External ear normal.  Mouth/Throat: Oropharynx is clear and moist.  Eyes: Conjunctivae and EOM are normal. Pupils are equal, round, and reactive to light.  Neck: Normal range of motion.  Cardiovascular: Normal rate, regular rhythm and normal heart sounds.   Pulmonary/Chest: Effort normal.  Abdominal: Soft. She exhibits no distension.  Musculoskeletal: Normal range of motion.  Neurological: She is alert and oriented to person, place, and time.  Skin: Skin is warm.  Psychiatric: She has a normal mood and affect.    ED Course  Procedures (including critical care time) Labs Review Labs Reviewed - No data to display  Imaging Review Dg Chest 2 View  11/29/2013   CLINICAL DATA:  Chest pain and shortness of breath of acute onset; history of uterine carcinoma  EXAM: CHEST  2 VIEW  COMPARISON:  August 24, 2013  FINDINGS: There is mild atelectasis in the left base. Lungs  elsewhere clear. Heart is the borderline enlarged with pulmonary vascularity within normal limits. No adenopathy. No bone lesions. No pneumothorax.  IMPRESSION: Mild cardiac enlargement. Mild atelectatic change left base. No edema or consolidation.   Electronically Signed   By: Lowella Grip M.D.   On: 11/29/2013 13:17     EKG Interpretation None     Results for orders placed during the hospital encounter of 11/29/13  CBC WITH DIFFERENTIAL      Result Value Ref Range  WBC 5.0  4.0 - 10.5 K/uL   RBC 3.94  3.87 - 5.11 MIL/uL   Hemoglobin 11.6 (*) 12.0 - 15.0 g/dL   HCT 36.0  36.0 - 46.0 %   MCV 91.4  78.0 - 100.0 fL   MCH 29.4  26.0 - 34.0 pg   MCHC 32.2  30.0 - 36.0 g/dL   RDW 13.1  11.5 - 15.5 %   Platelets 235  150 - 400 K/uL   Neutrophils Relative % 59  43 - 77 %   Neutro Abs 3.0  1.7 - 7.7 K/uL   Lymphocytes Relative 26  12 - 46 %   Lymphs Abs 1.3  0.7 - 4.0 K/uL   Monocytes Relative 10  3 - 12 %   Monocytes Absolute 0.5  0.1 - 1.0 K/uL   Eosinophils Relative 4  0 - 5 %   Eosinophils Absolute 0.2  0.0 - 0.7 K/uL   Basophils Relative 1  0 - 1 %   Basophils Absolute 0.0  0.0 - 0.1 K/uL  COMPREHENSIVE METABOLIC PANEL      Result Value Ref Range   Sodium 141  137 - 147 mEq/L   Potassium 4.1  3.7 - 5.3 mEq/L   Chloride 101  96 - 112 mEq/L   CO2 26  19 - 32 mEq/L   Glucose, Bld 111 (*) 70 - 99 mg/dL   BUN 18  6 - 23 mg/dL   Creatinine, Ser 0.70  0.50 - 1.10 mg/dL   Calcium 9.7  8.4 - 10.5 mg/dL   Total Protein 7.5  6.0 - 8.3 g/dL   Albumin 3.9  3.5 - 5.2 g/dL   AST 19  0 - 37 U/L   ALT 25  0 - 35 U/L   Alkaline Phosphatase 75  39 - 117 U/L   Total Bilirubin 0.4  0.3 - 1.2 mg/dL   GFR calc non Af Amer >90  >90 mL/min   GFR calc Af Amer >90  >90 mL/min   Anion gap 14  5 - 15  TROPONIN I      Result Value Ref Range   Troponin I <0.30  <0.30 ng/mL   Dg Chest 2 View  11/29/2013   CLINICAL DATA:  Chest pain and shortness of breath of acute onset; history of uterine  carcinoma  EXAM: CHEST  2 VIEW  COMPARISON:  August 24, 2013  FINDINGS: There is mild atelectasis in the left base. Lungs elsewhere clear. Heart is the borderline enlarged with pulmonary vascularity within normal limits. No adenopathy. No bone lesions. No pneumothorax.  IMPRESSION: Mild cardiac enlargement. Mild atelectatic change left base. No edema or consolidation.   Electronically Signed   By: Lowella Grip M.D.   On: 11/29/2013 13:17    MDM Pt had pain relief with nitro sublingula    I spoke to Dr. Everitt Amber who will admit.   Pt started on nitro and heparin   Final diagnoses:  Chest pain, unspecified chest pain type        Fransico Meadow, PA-C 11/29/13 1423

## 2013-11-29 NOTE — ED Notes (Signed)
Attempt to call report to RN on 2W, RN unable to take report at this time. Phone # left with Publishing rights manager.

## 2013-11-29 NOTE — CV Procedure (Signed)
Procedure performed:  Left heart catheterization including hemodynamic monitoring of the left ventricle, LV gram, selective right and left coronary arteriography.  Indication patient is a 63 year-old Caucasian female with history of hypertension,  hyperlipidemia,  Diabetes Mellitus   who presents with ongoing chest pain to the emergency room, due to ongoing chest pain on multiple cardiovascular risk factors she was brought to the coronary angiography suite the same day with a diagnosis of unstable angina pectoris.  Hemodynamic data:  Left ventricular pressure was 161/12 with LVEDP of 19 mm mercury. Aortic pressure was 157/71 with a mean of 104 mm mercury. There was no pressure gradient across the aortic valve  Left ventricle: Performed in the RAO projection revealed LVEF of 55-60 %. There was no significant MR. no wall motion abnormality.  Right coronary artery: The vessel is  Dominant, there is mild diffuse disease. It gives origin to a large PDA and PL branch..  Left main coronary artery is large and normal.  Circumflex coronary artery: A large vessel, with mild diffuse luminal irregularity. Continues as OM1 after giving origin to AV groove branch in the distal segment.   LAD:  LAD gives origin to a moderate sized D1 and large diagonal-2.  LAD has mild diffuse proximal coronary calcification and mild luminal irregularities.   Impression: No significant coronary artery disease by coronary angiography. There is mild diffuse noncritical coronary artery disease and mild calcification involving the proximal LAD. Right dominant circulation with normal left ventricular systolic function. In addition for noncardiac causes of chest pain is indicated.  Technique: Under sterile precautions using a 6 French right radial  arterial access, a 6 French sheath was introduced into the right radial artery. A 5 [place] Tig 4 catheter was advanced into the ascending aorta selective  right coronary artery and left  coronary artery was cannulated and angiography was performed in multiple views. The catheter was pulled back Out of the body over exchange length J-wire.  Same Catheter was used to perform LV gram which was performed in RAO projection.  Catheter exchanged out of the body over J-Wire. NO immediate complications noted. Patient tolerated the procedure well.   Rec: Medical therapy with aggressive risk factor reduction.   Disposition: Will be discharged home in the morning with outpatient follow up. A total of 70 cc of contrast was utilized for diagnostic angiography. There was no immediate complication.

## 2013-11-30 DIAGNOSIS — R0789 Other chest pain: Secondary | ICD-10-CM | POA: Diagnosis not present

## 2013-11-30 DIAGNOSIS — R0609 Other forms of dyspnea: Secondary | ICD-10-CM

## 2013-11-30 DIAGNOSIS — R0600 Dyspnea, unspecified: Secondary | ICD-10-CM

## 2013-11-30 DIAGNOSIS — E785 Hyperlipidemia, unspecified: Secondary | ICD-10-CM | POA: Diagnosis not present

## 2013-11-30 DIAGNOSIS — R0989 Other specified symptoms and signs involving the circulatory and respiratory systems: Secondary | ICD-10-CM

## 2013-11-30 DIAGNOSIS — I10 Essential (primary) hypertension: Secondary | ICD-10-CM | POA: Diagnosis not present

## 2013-11-30 DIAGNOSIS — E119 Type 2 diabetes mellitus without complications: Secondary | ICD-10-CM | POA: Diagnosis not present

## 2013-11-30 LAB — BASIC METABOLIC PANEL
Anion gap: 12 (ref 5–15)
BUN: 18 mg/dL (ref 6–23)
CALCIUM: 8.8 mg/dL (ref 8.4–10.5)
CO2: 25 meq/L (ref 19–32)
Chloride: 101 mEq/L (ref 96–112)
Creatinine, Ser: 0.64 mg/dL (ref 0.50–1.10)
GFR calc Af Amer: >90 mL/min (ref >90)
GLUCOSE: 196 mg/dL — AB (ref 70–99)
POTASSIUM: 4.1 meq/L (ref 3.7–5.3)
SODIUM: 138 meq/L (ref 137–147)

## 2013-11-30 LAB — LIPID PANEL
CHOL/HDL RATIO: 3.9 ratio
CHOLESTEROL: 163 mg/dL (ref 0–200)
HDL: 42 mg/dL (ref >39)
LDL CALC: 83 mg/dL (ref 0–99)
TRIGLYCERIDES: 190 mg/dL — AB (ref <150)
VLDL: 38 mg/dL (ref 0–40)

## 2013-11-30 LAB — TROPONIN I: Troponin I: <0.3 ng/mL (ref <0.30)

## 2013-11-30 LAB — GLUCOSE, CAPILLARY
Glucose-Capillary: 186 mg/dL — ABNORMAL HIGH (ref 70–99)
Glucose-Capillary: 180 mg/dL — ABNORMAL HIGH (ref 70–99)

## 2013-11-30 MED ORDER — METFORMIN HCL 1000 MG PO TABS: 1000.0000 mg | ORAL_TABLET | Freq: Two times a day (BID) | ORAL | Status: DC

## 2013-11-30 MED ORDER — AMLODIPINE BESYLATE 10 MG PO TABS
10.0000 mg | ORAL_TABLET | Freq: Every day | ORAL | Status: DC
  Administered 2013-11-30: 10 mg via ORAL
  Filled 2013-11-30: qty 1

## 2013-11-30 MED ORDER — AMLODIPINE BESYLATE 10 MG PO TABS: 10.0000 mg | ORAL_TABLET | Freq: Every day | ORAL | Status: DC

## 2013-11-30 MED ORDER — HYDROMORPHONE HCL 1 MG/ML IJ SOLN
1.0000 mg | INTRAMUSCULAR | Status: DC | PRN
  Administered 2013-11-30 (×3): 1 mg via INTRAVENOUS
  Filled 2013-11-30 (×3): qty 1

## 2013-11-30 NOTE — Progress Notes (Signed)
TR BAND REMOVAL  LOCATION:    right radial  DEFLATED PER PROTOCOL:    Yes.    TIME BAND OFF / DRESSING APPLIED:    22:00   SITE UPON ARRIVAL:    Level 0  SITE AFTER BAND REMOVAL:    Level 0  REVERSE ALLEN'S TEST:     positive  CIRCULATION SENSATION AND MOVEMENT:    Within Normal Limits   Yes.    COMMENTS:   Pt tolerated removal of TR band without complications, will continue to monitor patient

## 2013-11-30 NOTE — Progress Notes (Signed)
Placed patient on CPAP for the night at home settings of 13cm with oxygen set at 2lpm.

## 2013-11-30 NOTE — Discharge Summary (Signed)
Physician Discharge Summary  Patient ID: [name] [name] MRN: [id number] DOB/AGE: 05-03-1950 63 y.o.  Admit date: 11/29/2013 Discharge date: 11/30/2013  Primary Discharge Diagnosis Chest pain musculoskeletal Non critical CAD Secondary Discharge Diagnosis Hypertension Hyperlipidemia Morbid obesity with obesity hypoventilation Obstructive sleep apnea DM controlled  Significant Diagnostic Studies: 11/29/2013 Coronary angiogram Hemodynamic data:  Left ventricular pressure was 161/12 with LVEDP of 19 mm mercury. Aortic pressure was 157/71 with a mean of 104 mm mercury. There was no pressure gradient across the aortic valve  Left ventricle: Performed in the RAO projection revealed LVEF of 55-60 %. There was no significant MR. no wall motion abnormality.  Right coronary artery: The vessel is Dominant, there is mild diffuse disease. It gives origin to a large PDA and PL branch..  Left main coronary artery is large and normal.  Circumflex coronary artery: A large vessel, with mild diffuse luminal irregularity. Continues as OM1 after giving origin to AV groove branch in the distal segment.  LAD: LAD gives origin to a moderate sized D1 and large diagonal-2. LAD has mild diffuse proximal coronary calcification and mild luminal irregularities.  Impression: No significant coronary artery disease by coronary angiography. There is mild diffuse noncritical coronary artery disease and mild calcification involving the proximal LAD. Right dominant circulation with normal left ventricular systolic function. In addition for noncardiac causes of chest pain is indicated.    Hospital Course: Admitted with atypical chest pain when she presented to the ED and due to multiple CV risks and ongoing chest pain underwent coronary angiogram which is resutled above. BP control was suboptimal.   Recommendations on discharge: Amlodine added. Weight loss discussed. Needs OP carotid duplex and echo to evaluate dyspnea,  Aortic stenosis and carotid bruit.  Discharge Exam: Blood pressure 142/51, pulse 80, temperature 98.5 F (36.9 C), temperature source Axillary, resp. rate 18, weight 117.4 kg (258 lb 13.1 oz), SpO2 95.00%.   General appearance: alert, cooperative, appears older than stated age, no distress and morbidly obese  Eyes: negative findings: conjunctivae and sclerae normal  Neck: no adenopathy, no carotid bruit, supple, symmetrical, trachea midline, thyroid not enlarged, symmetric, no tenderness/mass/nodules and Short neck  Neck: JVP - normal, carotids 2+= bilateral bruits  Resp: clear to auscultation bilaterally  Chest wall: no tenderness  Cardio: regular rate and rhythm, S1, S2 normal, II/VI SEM in right sternal border, click, rub or gallop  GI: soft, non-tender; bowel sounds normal; no masses, no organomegaly and Pannus present  Extremities: extremities normal, atraumatic, no cyanosis or edema  Pulses: 2+ and symmetric  Skin: Skin color, texture, turgor normal. No rashes or lesions  Neurologic: Grossly normal   Labs:   Lab Results  Component Value Date   WBC 5.0 11/29/2013   HGB 11.6* 11/29/2013   HCT 36.0 11/29/2013   MCV 91.4 11/29/2013   PLT 235 11/29/2013    Recent Labs Lab 11/29/13 1200 11/30/13 0607  NA 141 138  K 4.1 4.1  CL 101 101  CO2 26 25  BUN 18 18  CREATININE 0.70 0.64  CALCIUM 9.7 8.8  PROT 7.5  --   BILITOT 0.4  --   ALKPHOS 75  --   ALT 25  --   AST 19  --   GLUCOSE 111* 196*   Lab Results  Component Value Date   TROPONINI <0.30 11/30/2013    Lipid Panel     Component Value Date/Time   CHOL 163 11/30/2013 0607   TRIG 190* 11/30/2013 0607   HDL  42 11/30/2013 0607   CHOLHDL 3.9 11/30/2013 0607   VLDL 38 11/30/2013 0607   LDLCALC 83 11/30/2013 0607    EKG: NSR. No ischemia    Radiology: Dg Chest 2 View  11/29/2013   CLINICAL DATA:  Chest pain and shortness of breath of acute onset; history of uterine carcinoma  EXAM: CHEST  2 VIEW  COMPARISON:  August 24, 2013  FINDINGS: There is mild atelectasis in the left base. Lungs elsewhere clear. Heart is the borderline enlarged with pulmonary vascularity within normal limits. No adenopathy. No bone lesions. No pneumothorax.  IMPRESSION: Mild cardiac enlargement. Mild atelectatic change left base. No edema or consolidation.   Electronically Signed   By: Lowella Grip M.D.   On: 11/29/2013 13:17      FOLLOW UP PLANS AND APPOINTMENTS    Medication List    STOP taking these medications       gemfibrozil 600 MG tablet  Commonly known as:  LOPID      TAKE these medications       amLODipine 10 MG tablet  Commonly known as:  NORVASC  Take 1 tablet (10 mg total) by mouth daily.     aspirin EC 81 MG tablet  Take 81 mg by mouth daily.     atorvastatin 40 MG tablet  Commonly known as:  LIPITOR  Take 40 mg by mouth daily.     benazepril 40 MG tablet  Commonly known as:  LOTENSIN  Take 40 mg by mouth daily.     carvedilol 25 MG tablet  Commonly known as:  COREG  Take 25 mg by mouth 2 (two) times daily with a meal.     ergocalciferol 50000 UNITS capsule  Commonly known as:  VITAMIN D2  Take 1 capsule (50,000 Units total) by mouth once a week.     escitalopram 20 MG tablet  Commonly known as:  LEXAPRO  Take 10 mg by mouth daily.     Estradiol 10 MCG Tabs vaginal tablet  Commonly known as:  VAGIFEM  Place 0.5 tablets (5 mcg total) vaginally 2 (two) times a week. Twice a week - on Sunday and Wednesday     meloxicam 15 MG tablet  Commonly known as:  MOBIC  Take 15 mg by mouth daily.     metFORMIN 1000 MG tablet  Commonly known as:  GLUCOPHAGE  Take 1 tablet (1,000 mg total) by mouth 2 (two) times daily with a meal.     omeprazole 20 MG capsule  Commonly known as:  PRILOSEC  Take 20 mg by mouth 2 (two) times daily before a meal.           Follow-up Information   Follow up with Laverda Page, MD. (Our office will call you with carotid duplex, echo and Office visit with me.)     Specialty:  Cardiology   Contact information:   [place] [place] [address] [phone]        Laverda Page, MD 11/30/2013, 9:14 AM  Pager: [phone] Office: [phone] If no answer: [phone]

## 2013-12-26 ENCOUNTER — Encounter (HOSPITAL_BASED_OUTPATIENT_CLINIC_OR_DEPARTMENT_OTHER): Payer: Self-pay | Admitting: Emergency Medicine

## 2013-12-27 ENCOUNTER — Ambulatory Visit: Payer: Medicaid Other | Admitting: Pulmonary Disease

## 2014-01-05 ENCOUNTER — Ambulatory Visit (INDEPENDENT_AMBULATORY_CARE_PROVIDER_SITE_OTHER): Payer: Medicaid Other | Admitting: Pulmonary Disease

## 2014-01-05 ENCOUNTER — Encounter: Payer: Self-pay | Admitting: Pulmonary Disease

## 2014-01-05 VITALS — BP 120/60 | HR 63 | Temp 97.0°F | Ht 64.0 in | Wt 256.2 lb

## 2014-01-05 DIAGNOSIS — G4733 Obstructive sleep apnea (adult) (pediatric): Secondary | ICD-10-CM

## 2014-01-05 NOTE — Assessment & Plan Note (Signed)
The patient feels that she is doing extremely well with C Pap, and is having no issues with her mask fit or pressure. She feels that she is sleeping well, and has excellent daytime alertness. Her only question is about her recent worsening hypertension, which has required an increase in medications. I have told her that we will get a download off her device to make sure that her sleep apnea is adequately controlled, since this can contribute to worsening hypertension. I have also encouraged her to work aggressively on weight loss.

## 2014-01-05 NOTE — Patient Instructions (Signed)
Will send an order to your home care company to get a download off your device.  Please call us if you do not hear from [name] within one week after the download is done.  Stay on cpap, and keep up with mask changes and supplies. Work on weight loss followup with me again in one year if doing well.

## 2014-01-05 NOTE — Progress Notes (Signed)
   Subjective:    Patient ID: [name] [name], female    DOB: [birth year], 63 y.o.   MRN: [id number]  HPI The patient comes in today for follow-up of her obstructive sleep apnea. She is wearing CPAP compliantly, and is having no issues with her mask fit or pressure. She feels that she is sleeping well with the device, and has excellent daytime alertness. Her weight is stable from the last visit. Her only comment today is related to worsening hypertension which has required an increase in her medications. I have told her that poorly treated sleep apnea could cause this, and we will need to get a download off her device.   Review of Systems  Constitutional: Negative for fever and unexpected weight change.  HENT: Negative for congestion, dental problem, ear pain, nosebleeds, postnasal drip, rhinorrhea, sinus pressure, sneezing, sore throat and trouble swallowing.   Eyes: Negative for redness and itching.  Respiratory: Negative for cough, chest tightness, shortness of breath and wheezing.   Cardiovascular: Negative for palpitations and leg swelling.  Gastrointestinal: Negative for nausea and vomiting.  Genitourinary: Negative for dysuria.  Musculoskeletal: Negative for joint swelling.  Skin: Negative for rash.  Neurological: Negative for headaches.  Hematological: Does not bruise/bleed easily.  Psychiatric/Behavioral: Negative for dysphoric mood. The patient is not nervous/anxious.        Objective:   Physical Exam Obese female in no acute distress Nose without purulence or discharge noted Neck without lymphadenopathy or thyromegaly No skin breakdown or pressure necrosis from the C mask Lower extremities with mild edema, no cyanosis Alert and oriented, moves all 4 extremities.      Assessment & Plan:

## 2014-01-09 ENCOUNTER — Ambulatory Visit (INDEPENDENT_AMBULATORY_CARE_PROVIDER_SITE_OTHER): Payer: Medicaid Other | Admitting: Nurse Practitioner

## 2014-01-09 ENCOUNTER — Encounter: Payer: Self-pay | Admitting: Nurse Practitioner

## 2014-01-09 VITALS — BP 126/64 | HR 76 | Ht 64.0 in | Wt 251.0 lb

## 2014-01-09 DIAGNOSIS — N8110 Cystocele, unspecified: Secondary | ICD-10-CM

## 2014-01-09 DIAGNOSIS — IMO0002 Reserved for concepts with insufficient information to code with codable children: Secondary | ICD-10-CM

## 2014-01-09 NOTE — Progress Notes (Signed)
Subjective:   63 y.o. Legally Separated white female G3P3 here for pessary check.  Patient has been using following pessary style and size: # 4 ring pessary with support..  She describes the following issues with the pessary:  None.  No further UTI symptoms.  She does have a place on her right forehead and above the right ear that is bothersome.   Use of protective clothing such as Depends or pads No.. Problems with protective clothing with rash No..  Constipation issues with use of pessary No..  She is not sexually active.  She is looking forward to the Holidays with a large family dinner.   ROS: No breast pain or new or enlarging lumps on self exam,  no abnormal bleeding, pelvic pain or discharge,   no dysuria, trouble voiding or hematuria. Compliant to use of vaginal cream Yes.   General Exam:    BP 126/64 mmHg  Pulse 76  Ht 5\' 4"  (1.626 m)  Wt 251 lb (113.853 kg)  BMI 43.06 kg/m2  General appearance: alert, cooperative and appears stated age   Pelvic: External genitalia:  no lesions   Before pessary was removed No. prolapse over the pessary   In correct position Yes.                Urethra: normal appearing urethra with no masses, tenderness or lesions              Vagina: normal appearing vagina with normal color and discharge, no lesions.  There are No abrasions or ulcerations.               Cervix: absent no lesions were not found   Bimanual Exam:  Uterus:  uterus absent                               Adnexa:    not indicated                             Pessary was removed without difficulty without using forceps.  Pessary was cleansed with Betadine.  Pessary was replaced. Patient tolerated procedure well.    Assessment :  No contraindication to continuing HRT    Urge incontinence    Cystocele- symptomatic   Use of pessary continued   Plan:    Return to office in 3 months for recheck.         If any UTI symptoms to call back    An After Visit Summary was printed and  given to the patient.

## 2014-01-10 ENCOUNTER — Telehealth: Payer: Self-pay | Admitting: Nurse Practitioner

## 2014-01-10 NOTE — Telephone Encounter (Signed)
Patient is calling for referral status. Patient says she was supposed to be referred to a dermatologist.

## 2014-01-10 NOTE — Progress Notes (Signed)
Encounter reviewed by Dr. Brook Silva.  

## 2014-01-11 NOTE — Telephone Encounter (Signed)
Spoke with patient after speaking with referral coordinator, Felipa Emory. Confirmed with patient that she has medicaid only as coverage. Advised patient that Medicaid requires a referral from pcp only for referral and will not cover a referral from GYN.  Patient is agreeable to this and will call Dr. Alphonzo Cruise office to discuss.  Routing to provider for final review. Patient agreeable to disposition. Will close encounter

## 2014-01-11 NOTE — Telephone Encounter (Signed)
Pt calling to check on referral to dermatologist.

## 2014-01-11 NOTE — Telephone Encounter (Signed)
[name] [name],  Patient requesting referral to dermatology. Okay to place referral? Patient reported skin issues on face at pessary check.

## 2014-02-02 ENCOUNTER — Encounter (HOSPITAL_COMMUNITY): Payer: Self-pay | Admitting: Cardiology

## 2014-02-06 ENCOUNTER — Telehealth: Payer: Self-pay | Admitting: Pulmonary Disease

## 2014-02-06 NOTE — Telephone Encounter (Signed)
See prior phone note. Already discussed with pt

## 2014-02-06 NOTE — Telephone Encounter (Signed)
Please let pt know that her download shows good control of her sleep apnea.  No changes recommended.

## 2014-02-06 NOTE — Telephone Encounter (Signed)
I spoke with patient about results and she verbalized understanding and had no questions 

## 2014-04-11 ENCOUNTER — Ambulatory Visit: Payer: Medicaid Other | Admitting: Nurse Practitioner

## 2014-04-17 ENCOUNTER — Ambulatory Visit (INDEPENDENT_AMBULATORY_CARE_PROVIDER_SITE_OTHER): Payer: Medicaid Other | Admitting: Nurse Practitioner

## 2014-04-17 ENCOUNTER — Telehealth: Payer: Self-pay | Admitting: Pulmonary Disease

## 2014-04-17 ENCOUNTER — Encounter: Payer: Self-pay | Admitting: Nurse Practitioner

## 2014-04-17 VITALS — BP 118/58 | HR 60 | Resp 16 | Ht 63.5 in | Wt 250.2 lb

## 2014-04-17 DIAGNOSIS — G4733 Obstructive sleep apnea (adult) (pediatric): Secondary | ICD-10-CM

## 2014-04-17 DIAGNOSIS — N8110 Cystocele, unspecified: Secondary | ICD-10-CM

## 2014-04-17 DIAGNOSIS — IMO0002 Reserved for concepts with insufficient information to code with codable children: Secondary | ICD-10-CM

## 2014-04-17 DIAGNOSIS — N993 Prolapse of vaginal vault after hysterectomy: Secondary | ICD-10-CM

## 2014-04-17 NOTE — Telephone Encounter (Signed)
[phone] returning call

## 2014-04-17 NOTE — Progress Notes (Signed)
Subjective:   64 y.o. Legally Separated White female G3P3000 here for pessary check.  Patient has been using following pessary style and size:  #4 ring pessary with support.  She describes the following issues with the pessary:  None, however after using the Vagifem last pm felt a sore spot on the right that may have been from the Vagifem applicator.   Use of protective clothing such as Depends or pads No.. Problems with protective clothing with rash No..  Constipation issues with use of pessary No.  She is not sexually active.     ROS:   no breast pain or new or enlarging lumps on self exam,  no abnormal bleeding, pelvic pain or discharge,   no dysuria, trouble voiding or hematuria. Compliant to use of vaginal estrogen Yes.   General Exam:    BP 118/58 mmHg  Pulse 60  Resp 16  Ht 5' 3.5" (1.613 m)  Wt 250 lb 3.2 oz (113.49 kg)  BMI 43.62 kg/m2  General appearance: alert, cooperative and appears stated age   Pelvic: External genitalia:  no lesions, normal escutcheon and well estrogenized   Before pessary was removed No. prolapse over the pessary   In correct position Yes.                Urethra: normal appearing urethra with no masses, tenderness or lesions              Vagina: normal appearing vagina with normal color and discharge, no lesions.  There was a small right sidewall abrasions that was tender.               Cervix: absent.   Bimanual Exam:  Uterus:  uterus absent                               Adnexa:    not indicated                             Pessary was removed without difficulty without using forceps.  Pessary was cleansed with Betadine.  Pessary was replaced. Patient tolerated procedure well.    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         continue with  Vagifem use but very careful with insertion.   An After Visit Summary was printed and given to the patient.

## 2014-04-17 NOTE — Telephone Encounter (Signed)
lmtcb X1 for pt  

## 2014-04-17 NOTE — Telephone Encounter (Signed)
Her donwload in Nov showed good control of her sleep apnea Would make sure she is not having a lot of mask leaks, and that she is keeping up with mask cushion changes.  If she feels this is not the problem, then we can get another download off her device for the last 4-6 weeks.

## 2014-04-17 NOTE — Telephone Encounter (Signed)
Spoke with pt. States that for the last 3 weeks, she has been using her CPAP but doesn't feel well rested. During the day she is having a hard time staying awake. Has been on CPAP therapy for a few years.  [place] - please advise. Thanks.

## 2014-04-17 NOTE — Telephone Encounter (Signed)
Spoke with pt. She reports she just received new cushions and does not feel this is causing her problems. Order placed to [facility] ot get download off machine.

## 2014-04-17 NOTE — Patient Instructions (Signed)
Recheck in 3 months.

## 2014-04-18 NOTE — Progress Notes (Signed)
Encounter reviewed by Dr. Brook Silva.  

## 2014-05-19 ENCOUNTER — Other Ambulatory Visit: Payer: Self-pay | Admitting: Nurse Practitioner

## 2014-05-22 NOTE — Telephone Encounter (Signed)
Medication refill request: Vitamin D 50,000 Last AEX:  04/25/13 Next AEX: 07/11/14 PG Last MMG (if hormonal medication request): 06/03/13 BIRADS1:Neg Refill authorized: 04/25/13 #30/3R. Today please advise.

## 2014-06-26 ENCOUNTER — Other Ambulatory Visit: Payer: Self-pay | Admitting: Nurse Practitioner

## 2014-06-26 NOTE — Telephone Encounter (Signed)
Medication refill request: Vagifem  Last AEX:  04/25/13 PG Next AEX: 07/11/14 PG Last MMG (if hormonal medication request): 06/03/13 BIRADS1:neg Refill authorized: 04/25/13 #24tabs/3R. Today #8tabs/0R?

## 2014-07-11 ENCOUNTER — Ambulatory Visit (INDEPENDENT_AMBULATORY_CARE_PROVIDER_SITE_OTHER): Payer: Medicaid Other | Admitting: Nurse Practitioner

## 2014-07-11 ENCOUNTER — Encounter: Payer: Self-pay | Admitting: Nurse Practitioner

## 2014-07-11 VITALS — BP 120/62 | HR 60 | Ht 63.0 in | Wt 253.0 lb

## 2014-07-11 DIAGNOSIS — Z0000 Encounter for general adult medical examination without abnormal findings: Secondary | ICD-10-CM

## 2014-07-11 DIAGNOSIS — E559 Vitamin D deficiency, unspecified: Secondary | ICD-10-CM | POA: Diagnosis not present

## 2014-07-11 DIAGNOSIS — N993 Prolapse of vaginal vault after hysterectomy: Secondary | ICD-10-CM | POA: Diagnosis not present

## 2014-07-11 DIAGNOSIS — Z01419 Encounter for gynecological examination (general) (routine) without abnormal findings: Secondary | ICD-10-CM | POA: Diagnosis not present

## 2014-07-11 LAB — POCT URINALYSIS DIPSTICK
Bilirubin, UA: NEGATIVE
Blood, UA: NEGATIVE
GLUCOSE UA: NEGATIVE
Ketones, UA: NEGATIVE
Leukocytes, UA: NEGATIVE
Nitrite, UA: NEGATIVE
Protein, UA: NEGATIVE
UROBILINOGEN UA: NEGATIVE
pH, UA: 6

## 2014-07-11 LAB — COMPREHENSIVE METABOLIC PANEL
ALT: 30 U/L (ref 0–35)
AST: 16 U/L (ref 0–37)
Albumin: 4.1 g/dL (ref 3.5–5.2)
Alkaline Phosphatase: 33 U/L — ABNORMAL LOW (ref 39–117)
BUN: 35 mg/dL — ABNORMAL HIGH (ref 6–23)
CO2: 22 meq/L (ref 19–32)
CREATININE: 1.32 mg/dL — AB (ref 0.50–1.10)
Calcium: 9.5 mg/dL (ref 8.4–10.5)
Chloride: 105 mEq/L (ref 96–112)
Glucose, Bld: 120 mg/dL — ABNORMAL HIGH (ref 70–99)
Potassium: 5.1 mEq/L (ref 3.5–5.3)
Sodium: 138 mEq/L (ref 135–145)
TOTAL PROTEIN: 7.1 g/dL (ref 6.0–8.3)
Total Bilirubin: 0.4 mg/dL (ref 0.2–1.2)

## 2014-07-11 LAB — CBC WITH DIFFERENTIAL/PLATELET
BASOS ABS: 0.1 10*3/uL (ref 0.0–0.1)
Basophils Relative: 1 % (ref 0–1)
EOS PCT: 3 % (ref 0–5)
Eosinophils Absolute: 0.2 10*3/uL (ref 0.0–0.7)
HCT: 33.9 % — ABNORMAL LOW (ref 36.0–46.0)
Hemoglobin: 11.1 g/dL — ABNORMAL LOW (ref 12.0–15.0)
LYMPHS ABS: 1.9 10*3/uL (ref 0.7–4.0)
Lymphocytes Relative: 32 % (ref 12–46)
MCH: 30.1 pg (ref 26.0–34.0)
MCHC: 32.7 g/dL (ref 30.0–36.0)
MCV: 91.9 fL (ref 78.0–100.0)
MPV: 9.9 fL (ref 8.6–12.4)
Monocytes Absolute: 0.7 10*3/uL (ref 0.1–1.0)
Monocytes Relative: 11 % (ref 3–12)
NEUTROS ABS: 3.2 10*3/uL (ref 1.7–7.7)
Neutrophils Relative %: 53 % (ref 43–77)
PLATELETS: 274 10*3/uL (ref 150–400)
RBC: 3.69 MIL/uL — ABNORMAL LOW (ref 3.87–5.11)
RDW: 13.1 % (ref 11.5–15.5)
WBC: 6 10*3/uL (ref 4.0–10.5)

## 2014-07-11 MED ORDER — ESTRADIOL 10 MCG VA TABS: ORAL_TABLET | VAGINAL | Status: DC

## 2014-07-11 NOTE — Patient Instructions (Signed)

## 2014-07-11 NOTE — Progress Notes (Signed)
Patient ID: [name] [name], female   DOB: [birth year], 64 y.o.   MRN: [id number] 64 y.o. G3P3000 Legally Separated  Caucasian Fe here for annual exam.  She had problems with BP and fluid in October.  Now on a fluid pill and feels much better.  No problems with urinary infection or pain.  No vaginal discharge.  She is not always compliant to using vaginal estrogen twice a week with her pessary, sometimes only gets one dose a week.  Patient's last menstrual period was 11/25/2002.          Sexually active: No.  The current method of family planning is none and status post hysterectomy.    Exercising: Yes.    walking at least 4 times weekly, but tries to walk daily Smoker:  no  Health Maintenance: Pap:  12/25/11, negative with neg HR HPV MMG:  06/2013, normal per patient, no report at time of visit Colonoscopy:  10/26/09, polyp, repeat in 5 years, scheduled for June 8 BMD:    03/2010, 1.0/-0.2/2.4 TDaP:  UTD Labs:  HB:  11.2  Urine:  neg   reports that she has never smoked. She has never used smokeless tobacco. She reports that she does not drink alcohol or use illicit drugs.  Past Medical History  Diagnosis Date  . Hyperlipidemia   . GERD (gastroesophageal reflux disease)   . HTN (hypertension) 2005  . Biliary dyskinesia 07/2006  . Urinary, incontinence, stress female 12/13    using Pessary 01/2012  . Gastroparesis 7/08    Dr. Collene Mares  . Diverticulosis     Hx: of  . Depression   . Pneumonia 1990's X 1  . OSA on CPAP   . Type II diabetes mellitus 2005  . Arthritis     "hands, neck, right knee" (11/29/2013)  . Cancer, uterine 05/2003    F1G01,G1    Past Surgical History  Procedure Laterality Date  . Tubal ligation    . Knee arthroscopy Left 2010  . Colonoscopy w/ biopsies  10/04    polyp  . Colonoscopy w/ biopsies  02/12/05    benign polyp  . Colonoscopy with esophagogastroduodenoscopy (egd)  09/14/06    2 polyps, diverticula, diffuse gastritis  . Colonoscopy w/ biopsies  10/26/09   1 polyp benign, recheck 5 years  . Total knee arthroplasty Left 08/02/2012    Procedure: TOTAL KNEE ARTHROPLASTY;  Surgeon: Alta Corning, MD;  Location: [facility];  Service: Orthopedics;  Laterality: Left;  left total knee arthroplasty  . Knee arthroscopy Right 11/2012  . Total hip arthroplasty Right 09/02/2013    Procedure: RIGHT TOTAL HIP ARTHROPLASTY ANTERIOR APPROACH;  Surgeon: Alta Corning, MD;  Location: [facility];  Service: Orthopedics;  Laterality: Right;  . Cardiac catheterization  11/29/2013  . Laparoscopic cholecystectomy  10/2006  . Joint replacement    . Total abdominal hysterectomy  05/2003    w/BSO; endometrial carcinoma F1G01, G1  . Left heart catheterization with coronary angiogram N/A 11/29/2013    Procedure: LEFT HEART CATHETERIZATION WITH CORONARY ANGIOGRAM;  Surgeon: Laverda Page, MD;  Location: [facility] CATH LAB;  Service: Cardiovascular;  Laterality: N/A;    Current Outpatient Prescriptions  Medication Sig Dispense Refill  . amLODipine (NORVASC) 10 MG tablet Take 1 tablet (10 mg total) by mouth daily. 30 tablet 2  . atorvastatin (LIPITOR) 40 MG tablet Take 40 mg by mouth daily.    . benazepril (LOTENSIN) 40 MG tablet Take 40 mg by mouth daily.    Marland Kitchen  carvedilol (COREG) 25 MG tablet Take 25 mg by mouth 2 (two) times daily with a meal.    . chlorthalidone (HYGROTON) 25 MG tablet 1 table once daily  3  . Choline Fenofibrate (FENOFIBRIC ACID) 135 MG CPDR Take by mouth daily.    Marland Kitchen escitalopram (LEXAPRO) 20 MG tablet Take 10 mg by mouth daily.    . Estradiol (VAGIFEM) 10 MCG TABS vaginal tablet INSERT 1/2 TABLET VAGINALLY TWICE A WEEK ON SUNDAY AND WEDNESDAY 24 tablet 3  . fluticasone (FLONASE) 50 MCG/ACT nasal spray as needed.  6  . meloxicam (MOBIC) 15 MG tablet Take 15 mg by mouth daily.    . metFORMIN (GLUCOPHAGE) 1000 MG tablet Take 1 tablet (1,000 mg total) by mouth 2 (two) times daily with a meal.    . omeprazole (PRILOSEC) 20 MG capsule Take 20 mg by mouth 2 (two) times daily  before a meal.     . ONGLYZA 5 MG TABS tablet Once daily  6  . spironolactone (ALDACTONE) 50 MG tablet Take 50 mg by mouth daily.    . Vitamin D, Ergocalciferol, (DRISDOL) 50000 UNITS CAPS capsule TAKE 1 CAPSULE BY MOUTH ONCE A WEEK 30 capsule 0   No current facility-administered medications for this visit.    Family History  Problem Relation Age of Onset  . Heart disease Father 65    CABG  . Lung cancer Father 66    lung  . Hypertension Father   . Diabetes Father   . Heart disease Sister 2    CABG  . Diabetes Sister 41  . Heart disease Brother 39    MI with stents  . Pancreatic cancer Other     x 3 - 2 MAunts and 1 P Aunts.   . Lupus Other     Pat Aunt  . Hypertension Mother   . Diabetes Mother   . Hypertension Sister     x2   . Diabetes Sister   . Hypertension Brother   . Diabetes Brother     ROS:  Pertinent items are noted in HPI.  Otherwise, a comprehensive ROS was negative.  Exam:   BP 120/62 mmHg  Pulse 60  Ht 5\' 3"  (1.6 m)  Wt 253 lb (114.76 kg)  BMI 44.83 kg/m2  LMP 11/25/2002 Height: 5\' 3"  (160 cm) Ht Readings from Last 3 Encounters:  07/11/14 5\' 3"  (1.6 m)  04/17/14 5' 3.5" (1.613 m)  01/09/14 5\' 4"  (1.626 m)    General appearance: alert, cooperative and appears stated age Head: Normocephalic, without obvious abnormality, atraumatic Neck: no adenopathy, supple, symmetrical, trachea midline and thyroid normal to inspection and palpation Lungs: clear to auscultation bilaterally Breasts: normal appearance, no masses or tenderness Heart: regular rate and rhythm Abdomen: soft, non-tender; no masses,  no organomegaly Extremities: extremities normal, atraumatic, no cyanosis or edema Skin: Skin color, texture, turgor normal. No rashes or lesions Lymph nodes: Cervical, supraclavicular, and axillary nodes normal. No abnormal inguinal nodes palpated Neurologic: Grossly normal   Pelvic: External genitalia:  no lesions              Urethra:  normal  appearing urethra with no masses, tenderness or lesions              Bartholin's and Skene's: normal                 Vagina: normal appearing vagina with pessary removed and cleaned.  One small area of erosion at 6:30 position that could also  be from using vaginal applicator.  No discharge.  Pessary is replaced after collection of pap.              Cervix: absent              Pap taken: Yes.   Bimanual Exam:  Uterus:  uterus absent              Adnexa: no mass, fullness, tenderness               Rectovaginal: Confirms               Anus:  normal sphincter tone, no lesions  Chaperone present:  yes  A:  Well Woman with normal exam  Postmenopausal S/P TAH/ BSO secondary to endometrial cancer grade 1 05/2003 Urinary incontinence with use of size #4 pessary Vit D deficiency History of HTN, Diabetes, OA  ? anemia    P:   Reviewed health and wellness pertinent to exam  Pap smear as above  Mammogram is due now and will schedule  Refill on Vagifem and Vit D for a year  Counseled with risk of DVT, CVA, cancer  Follow with labs- CBC, Vit D, CMP, pap  Counseled on breast self exam, mammography screening, use and side effects of HRT, adequate intake of calcium and vitamin D, diet and exercise, Kegel's exercises return annually or prn  An After Visit Summary was printed and given to the patient.

## 2014-07-12 LAB — HEMOGLOBIN, FINGERSTICK: Hemoglobin, fingerstick: 11.2 g/dL — ABNORMAL LOW (ref 12.0–16.0)

## 2014-07-12 LAB — VITAMIN D 25 HYDROXY (VIT D DEFICIENCY, FRACTURES): VIT D 25 HYDROXY: 27 ng/mL — AB (ref 30–100)

## 2014-07-13 LAB — IPS PAP TEST WITH HPV

## 2014-07-16 NOTE — Progress Notes (Signed)
Encounter reviewed by Dr. Takiah Maiden Silva.  

## 2014-07-17 ENCOUNTER — Telehealth: Payer: Self-pay | Admitting: *Deleted

## 2014-07-17 NOTE — Telephone Encounter (Signed)
-----   Message from [place], [place] sent at 07/13/2014  8:00 AM EDT ----- Please let pt. Know that her  Vit D is slow at 27 and she needs to continue on Vit D RX weekly as she is now doing.  The CMP labs shows an elevated kidney function test - higher than it was earlier 7 months ago.  Have her to see PCP about this.  The sodium, potassium was OK - (concerned about this with taking a new fluid pill).  The blood sugar was elevated but this was not fasting and it was lower than 7 months ago.  The liver test was normal.  CBC did reveal a low iron count at 11.1 .  We should copy these labs and send to PCP.

## 2014-07-17 NOTE — Telephone Encounter (Signed)
I have attempted to contact this patient by phone with the following results: left message to return call to [place] at [phone] on answering machine (mobile per [facility]).  Advised call is regarding lab results.  [phone] (Mobile)

## 2014-07-25 NOTE — Telephone Encounter (Signed)
Pt notified in result note.  Closing encounter. 

## 2014-08-25 DIAGNOSIS — E782 Mixed hyperlipidemia: Secondary | ICD-10-CM | POA: Insufficient documentation

## 2014-08-25 DIAGNOSIS — Z8542 Personal history of malignant neoplasm of other parts of uterus: Secondary | ICD-10-CM | POA: Insufficient documentation

## 2014-08-25 DIAGNOSIS — E662 Morbid (severe) obesity with alveolar hypoventilation: Secondary | ICD-10-CM | POA: Insufficient documentation

## 2014-09-22 ENCOUNTER — Encounter: Payer: Self-pay | Admitting: Nurse Practitioner

## 2014-10-11 ENCOUNTER — Ambulatory Visit (INDEPENDENT_AMBULATORY_CARE_PROVIDER_SITE_OTHER): Payer: Medicaid Other | Admitting: Nurse Practitioner

## 2014-10-11 ENCOUNTER — Encounter: Payer: Self-pay | Admitting: Nurse Practitioner

## 2014-10-11 VITALS — BP 122/70 | HR 62 | Resp 14 | Wt 256.2 lb

## 2014-10-11 DIAGNOSIS — IMO0002 Reserved for concepts with insufficient information to code with codable children: Secondary | ICD-10-CM

## 2014-10-11 DIAGNOSIS — N993 Prolapse of vaginal vault after hysterectomy: Secondary | ICD-10-CM | POA: Diagnosis not present

## 2014-10-11 DIAGNOSIS — N8110 Cystocele, unspecified: Secondary | ICD-10-CM | POA: Diagnosis not present

## 2014-10-11 NOTE — Patient Instructions (Signed)
Recheck in 3 months.

## 2014-10-11 NOTE — Progress Notes (Signed)
Subjective:   64 y.o. Legally Separated white female G3P3000 here for pessary check.  Patient has been using following pessary style and size: # 4 ring pessary with support.  She describes the following issues with the pessary:  None.  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  She goes along with them and prepares meals. She loves to be involved and helpful to others.   Use of protective clothing such as Depends or pads No.. Problems with protective clothing with rash No..  Constipation issues with use of pessary No..  She is not sexually active.     ROS:   no breast pain or new or enlarging lumps on self exam,  no abnormal bleeding, pelvic pain or discharge,   no dysuria, trouble voiding or hematuria. Compliant to use of vaginal tablet Yes.   General Exam:    BP 122/70 mmHg  Pulse 62  Resp 14  Wt 256 lb 3.2 oz (116.212 kg)  LMP 11/25/2002  General appearance: alert, cooperative, appears stated age and no distress   Pelvic: External genitalia:  no lesions   Before pessary was removed No. prolapse over the pessary   In correct position Yes.                Urethra: normal appearing urethra with no masses, tenderness or lesions              Vagina: normal appearing vagina with normal color and discharge, no lesions.  There are No abrasions or ulcerations.               Cervix: absent Cervical lesions were not found   Bimanual Exam:  Uterus:  uterus absent                               Adnexa:    not indicated                             Pessary was removed with difficulty with using forceps.  Pessary was cleansed with Betadine.  Pessary was replaced. Patient tolerated procedure well.    Assessment :  SUI, Cystocele- symptomatic       S/P TAH/BSO secondary to endo carcinoma 05/2003   Use of pessary continued   Plan:    Return to office in 3 months for recheck.         continue use of Vagifem    An After Visit  Summary was printed and given to the patient.

## 2014-10-15 NOTE — Progress Notes (Signed)
Encounter reviewed by Dr. Neymar Dowe Amundson C. Silva.  

## 2014-11-23 DIAGNOSIS — K2100 Gastro-esophageal reflux disease with esophagitis, without bleeding: Secondary | ICD-10-CM | POA: Insufficient documentation

## 2014-12-13 ENCOUNTER — Encounter: Payer: Self-pay | Admitting: Pulmonary Disease

## 2014-12-13 ENCOUNTER — Ambulatory Visit (INDEPENDENT_AMBULATORY_CARE_PROVIDER_SITE_OTHER): Payer: Medicaid Other | Admitting: Pulmonary Disease

## 2014-12-13 VITALS — BP 122/62 | HR 65 | Ht 63.0 in | Wt 257.6 lb

## 2014-12-13 DIAGNOSIS — G4733 Obstructive sleep apnea (adult) (pediatric): Secondary | ICD-10-CM

## 2014-12-13 NOTE — Patient Instructions (Signed)
CPAP is working well - pressure is good CPAP supplies will be renewed x 1 year Your sleepiness may be related to high dose coreg Take lexapro in daytime Morning light exposure x 20-30 mins If nothing else works , can consider medication for sleepiness

## 2014-12-13 NOTE — Addendum Note (Signed)
Addended by: Mathis Dad on: 12/13/2014 10:26 AM   Modules accepted: Orders

## 2014-12-13 NOTE — Assessment & Plan Note (Signed)
Remains sleepy inspite of good compliance & pr CPAP is working well - pressure is good CPAP supplies will be renewed x 1 year Your sleepiness may be related to high dose coreg Take lexapro in daytime Morning light exposure x 20-30 mins If nothing else works , can consider medication for sleepiness

## 2014-12-13 NOTE — Progress Notes (Signed)
   Subjective:    Patient ID: [name] [name], female    DOB: [birth year], 64 y.o.   MRN: [id number]  HPI  Chief Complaint  Patient presents with  . Follow-up    Former [facility] pt. OSA. Pt using BiPAP nightly, Pt c/o continued daytime somnolence, although she reports sleeping well at night.     01/2014 good usage,   she has been using her CPAP but doesn't feel well rested. During the day she is having a hard time staying awake. Download 11/2014 >> excellent usage, no residuals, no leak on FF mask Mask ok, pr ok, no dryness Daughter confirms sleepiness Sugars ok, BP better controled on 3 meds   Significant tests/ events  NPSG 02/2012:  AHI 37/hr, desat to 78% Auto 2014:  Optimal pressure 16cm. 12/2012:  Decrease cpap to 14cm (10 pound weight loss).    Review of Systems  neg for any significant sore throat, dysphagia, itching, sneezing, nasal congestion or excess/ purulent secretions, fever, chills, sweats, unintended wt loss, pleuritic or exertional cp, hempoptysis, orthopnea pnd or change in chronic leg swelling. Also denies presyncope, palpitations, heartburn, abdominal pain, nausea, vomiting, diarrhea or change in bowel or urinary habits, dysuria,hematuria, rash, arthralgias, visual complaints, headache, numbness weakness or ataxia.     Objective:   Physical Exam  Gen. Pleasant, obese, in no distress ENT - no lesions, no post nasal drip Neck: No JVD, no thyromegaly, no carotid bruits Lungs: no use of accessory muscles, no dullness to percussion, decreased without rales or rhonchi  Cardiovascular: Rhythm regular, heart sounds  normal, no murmurs or gallops, no peripheral edema Musculoskeletal: No deformities, no cyanosis or clubbing , no tremors       Assessment & Plan:

## 2014-12-22 ENCOUNTER — Encounter: Payer: Self-pay | Admitting: Pulmonary Disease

## 2015-01-08 ENCOUNTER — Ambulatory Visit: Payer: Medicaid Other | Admitting: Pulmonary Disease

## 2015-01-12 ENCOUNTER — Encounter: Payer: Self-pay | Admitting: Nurse Practitioner

## 2015-01-12 ENCOUNTER — Ambulatory Visit (INDEPENDENT_AMBULATORY_CARE_PROVIDER_SITE_OTHER): Payer: Medicaid Other | Admitting: Nurse Practitioner

## 2015-01-12 VITALS — BP 120/60 | HR 76 | Resp 20 | Ht 63.0 in | Wt 255.0 lb

## 2015-01-12 DIAGNOSIS — N993 Prolapse of vaginal vault after hysterectomy: Secondary | ICD-10-CM | POA: Diagnosis not present

## 2015-01-12 NOTE — Patient Instructions (Signed)
Recheck in 3 months.

## 2015-01-12 NOTE — Progress Notes (Signed)
Subjective:   64 y.o. Legally Separated White female G3P3000 here for pessary check.  Patient has been using following pessary style and size: # 4 ring with support.  She describes the following issues with the pessary:  none.   Her grand daughter is expecting her first great grandchild.  She is very pleased.  She will find out the sex of baby in 2 weeks.  They are having Thanksgiving at her daughters this year.   Use of protective clothing such as Depends or pads Yes.  . Problems with protective clothing with rash Yes.   but only slight.  Constipation issues with use of pessary No..  She is not sexually active.     ROS:   no breast pain or new or enlarging lumps on self exam,  no abnormal bleeding, pelvic pain or discharge,   no dysuria, trouble voiding or hematuria. Compliant to use of vaginal cream Yes.   General Exam:    BP 120/60 mmHg  Pulse 76  Resp 20  Ht 5\' 3"  (1.6 m)  Wt 255 lb (115.667 kg)  BMI 45.18 kg/m2  LMP 11/25/2002  General appearance: alert, cooperative and appears stated age   Pelvic: External genitalia:  no lesions   Before pessary was removed No. prolapse over the pessary   In correct position Yes.                Urethra: normal appearing urethra with no masses, tenderness or lesions              Vagina: normal appearing vagina with normal color and discharge, except for an abrasive slight pink area right cuff without lesions and ulceration.                  Cervix: absent    Bimanual Exam:  Uterus:  uterus absent                               Adnexa:    not indicated                             Pessary was removed without difficulty without using forceps.  Pessary was cleansed with Betadine.  Pessary was replaced. Patient tolerated procedure well.    Assessment :  SUI, Urge incontinence, Cystocele- symptomatic        S/P TAH/BSO secondary to endo carcinoma 05/2003   Use of pessary continued   Plan:    Return to office in 3 months for recheck.  Continue use of Vagifem - she has some vaginal estrogen cream at home - so she is advised to use 1/2 gm twice weekly for next 2 weeks as this will help to heal the abrasion right vaginal cuff area.    An After Visit Summary was printed and given to the patient.

## 2015-01-15 NOTE — Progress Notes (Signed)
Encounter reviewed by Dr. Brook Amundson C. Silva.  

## 2015-03-31 ENCOUNTER — Other Ambulatory Visit: Payer: Self-pay | Admitting: Nurse Practitioner

## 2015-04-02 NOTE — Telephone Encounter (Signed)
Medication refill request: Vit D Last AEX:  07/11/14 PG Next AEX: 07/16/15 PG Last MMG (if hormonal medication request): 06/30/14 BIRADS2:benign  Refill authorized: please advise.

## 2015-04-16 ENCOUNTER — Encounter: Payer: Self-pay | Admitting: Nurse Practitioner

## 2015-04-16 ENCOUNTER — Ambulatory Visit (INDEPENDENT_AMBULATORY_CARE_PROVIDER_SITE_OTHER): Payer: Medicaid Other | Admitting: Nurse Practitioner

## 2015-04-16 VITALS — BP 120/60 | HR 64 | Ht 63.0 in | Wt 254.0 lb

## 2015-04-16 DIAGNOSIS — N993 Prolapse of vaginal vault after hysterectomy: Secondary | ICD-10-CM

## 2015-04-16 DIAGNOSIS — Z9289 Personal history of other medical treatment: Secondary | ICD-10-CM | POA: Diagnosis not present

## 2015-04-16 DIAGNOSIS — IMO0002 Reserved for concepts with insufficient information to code with codable children: Secondary | ICD-10-CM

## 2015-04-16 DIAGNOSIS — Z960 Presence of urogenital implants: Secondary | ICD-10-CM

## 2015-04-16 DIAGNOSIS — N8110 Cystocele, unspecified: Secondary | ICD-10-CM | POA: Diagnosis not present

## 2015-04-16 MED ORDER — ESTROGENS, CONJUGATED 0.625 MG/GM VA CREA: TOPICAL_CREAM | VAGINAL | Status: DC

## 2015-04-16 NOTE — Progress Notes (Signed)
Subjective:   65 y.o. Legally Separated White female G3P3000 here for pessary check.  Patient has been using following pessary style and size:  # 4 ring with support.  She describes the following issues with the pessary:  Noted on and off for about a month and light pink to brown discharge.   Use of protective clothing such as Depends or pads No.. Problems with protective clothing with rash No..  Constipation issues with use of pessary No..  She is not sexually active.     ROS:   no breast pain or new or enlarging lumps on self exam,  no abnormal bleeding, pelvic pain or discharge,   no dysuria, trouble voiding or hematuria. Compliant to use of vaginal cream Yes.  She has been using the Trimosan twice weekly with the pessary along with Vagifem.  The Vagifem has been spread out usage secondary to cost, she used none for about 3 weeks.  General Exam:    BP 120/60 mmHg  Pulse 64  Ht 5\' 3"  (1.6 m)  Wt 254 lb (115.214 kg)  BMI 45.01 kg/m2  LMP 11/25/2002  General appearance: alert, cooperative, appears stated age and no distress   Pelvic: External genitalia:  no lesions   Before pessary was removed No. prolapse over the pessary   In correct position Yes.                Urethra: normal appearing urethra with no masses, tenderness or lesions              Vagina: atrophic, vaginal erythema and excoriations along the right and left sidewalls.  There are Yes abrasions or ulcerations.               Cervix: absent    Bimanual Exam:  Uterus:  uterus absent                               Adnexa:    not indicated                             Pessary was removed without difficulty without using forceps.  Pessary was cleansed with Betadine.  Pessary was not replaced. Patient tolerated procedure well.    Assessment :  SUI, Urge incontinence, Cystocele symptomatic       S/P TAH/BSO secondary to endo carcinoma 4/205   Vaginal excoriations secondary to pessary use and decrease in Vagifem use   Use of  pessary on hold at this time   Plan:    Return to office in 2 weeks for recheck.   In the interim will use Premarin vaginal cream 1/2 gm three times a week. - new order is placed.   Hold the Trimosan at this time        We have her pessary to cleaned and will be put on my desk.    An After Visit Summary was printed and given to the patient.   Of note:  Her sister is [name] who was recently diagnosed with neuroendocrine cancer of mass right axilla.  Sent her our best regards.

## 2015-04-16 NOTE — Patient Instructions (Signed)
Will have you return in 2 weeks

## 2015-04-18 NOTE — Progress Notes (Signed)
Encounter reviewed by Dr. Lenford Beddow Amundson C. Silva.  

## 2015-04-30 ENCOUNTER — Ambulatory Visit (INDEPENDENT_AMBULATORY_CARE_PROVIDER_SITE_OTHER): Payer: Medicaid Other | Admitting: Nurse Practitioner

## 2015-04-30 ENCOUNTER — Encounter: Payer: Self-pay | Admitting: Nurse Practitioner

## 2015-04-30 VITALS — BP 110/66 | HR 64 | Ht 63.0 in | Wt 253.0 lb

## 2015-04-30 DIAGNOSIS — N8110 Cystocele, unspecified: Secondary | ICD-10-CM

## 2015-04-30 DIAGNOSIS — IMO0002 Reserved for concepts with insufficient information to code with codable children: Secondary | ICD-10-CM

## 2015-04-30 DIAGNOSIS — N993 Prolapse of vaginal vault after hysterectomy: Secondary | ICD-10-CM

## 2015-04-30 NOTE — Patient Instructions (Signed)
Please continue to monitor bowel habits and urinary urgency.  With the pessary out watch for times that you are unable to void.

## 2015-04-30 NOTE — Progress Notes (Signed)
Subjective:   65 y.o. Legally Separated White female G3P3000 here for a follow up.  She was found at last visit 2/20/to have vaginal erythema and excoriations along the right and left sidewalls.  She had been using Trimosan to help reduce cost of vaginal estrogen. Her pessary was removed but later destroyed as it needed to be replaced due to discoloration and odor.   Patient had been using following pessary style and size:  #4 ring with support.    Use of protective clothing such as Depends or pads No.. Problems with protective clothing with rash No..  Constipation issues with use of pessary Yes.  .  She now realizes since pessary was removed that she is able to have better bowel movements without any fecal seepage.  She has not noted an increase in urinary leakage and incontinence.  She denies any current vaginal spotting or discharge.  She has used the Premarin vaginal cream at least twice a week since last here.  She is not sexually active.     ROS:   no breast pain or new or enlarging lumps on self exam,  no abnormal bleeding, pelvic pain or discharge,   no dysuria, trouble voiding or hematuria. Compliant to use of vaginal cream Yes.   General Exam:    BP 110/66 mmHg  Pulse 64  Ht 5\' 3"  (1.6 m)  Wt 253 lb (114.76 kg)  BMI 44.83 kg/m2  LMP 11/25/2002  General appearance: alert, cooperative, appears stated age and no distress   Pelvic: External genitalia:  no lesions   Pessary was not in place as it was removed on 04/16/15              Urethra: normal appearing urethra with no masses, tenderness or lesions              Vagina: normal appearing vagina with normal color and discharge, no lesions.  There are No abrasions or ulcerations.               Cervix: absent    Bimanual Exam:  Uterus:  uterus absent                                                           A new Pessary size # 4 ring with support is available but she wants to wait and see if she does OK without a new insertion.  She  is already going to return in May and will decide then.  In the interim if increase in incontinence or problems to let us know.  She will continue with Premarin vaginal cream at least once a week.    Assessment :  No contraindication to continuing hormonal contraception,    Urge incontinence,    Cystocele- asymptomatic      S/P TAH/BSO secondary to endo carcinoma 05/2003   Use of pessary was not continued due to constipation issues   Plan:    Return to office in 2 months for recheck.         pessary we had for her was not charged and was returned to stock per [place].  An After Visit Summary was printed and given to the patient.

## 2015-05-04 NOTE — Progress Notes (Signed)
Encounter reviewed by Dr. Brook Amundson C. Silva.  

## 2015-07-16 ENCOUNTER — Encounter: Payer: Self-pay | Admitting: Nurse Practitioner

## 2015-07-16 ENCOUNTER — Ambulatory Visit (INDEPENDENT_AMBULATORY_CARE_PROVIDER_SITE_OTHER): Payer: Medicaid Other | Admitting: Nurse Practitioner

## 2015-07-16 VITALS — BP 128/64 | HR 60 | Ht 63.0 in | Wt 258.0 lb

## 2015-07-16 DIAGNOSIS — Z01419 Encounter for gynecological examination (general) (routine) without abnormal findings: Secondary | ICD-10-CM | POA: Diagnosis not present

## 2015-07-16 DIAGNOSIS — Z23 Encounter for immunization: Secondary | ICD-10-CM | POA: Diagnosis not present

## 2015-07-16 DIAGNOSIS — Z0000 Encounter for general adult medical examination without abnormal findings: Secondary | ICD-10-CM

## 2015-07-16 DIAGNOSIS — N993 Prolapse of vaginal vault after hysterectomy: Secondary | ICD-10-CM | POA: Diagnosis not present

## 2015-07-16 DIAGNOSIS — Z9289 Personal history of other medical treatment: Secondary | ICD-10-CM

## 2015-07-16 DIAGNOSIS — E559 Vitamin D deficiency, unspecified: Secondary | ICD-10-CM | POA: Diagnosis not present

## 2015-07-16 DIAGNOSIS — L989 Disorder of the skin and subcutaneous tissue, unspecified: Secondary | ICD-10-CM

## 2015-07-16 DIAGNOSIS — Z960 Presence of urogenital implants: Secondary | ICD-10-CM

## 2015-07-16 LAB — COMPREHENSIVE METABOLIC PANEL
ALBUMIN: 4 g/dL (ref 3.6–5.1)
ALK PHOS: 32 U/L — AB (ref 33–130)
ALT: 26 U/L (ref 6–29)
AST: 19 U/L (ref 10–35)
BILIRUBIN TOTAL: 0.4 mg/dL (ref 0.2–1.2)
BUN: 35 mg/dL — AB (ref 7–25)
CHLORIDE: 101 mmol/L (ref 98–110)
CO2: 27 mmol/L (ref 20–31)
CREATININE: 1.24 mg/dL — AB (ref 0.50–0.99)
Calcium: 9.3 mg/dL (ref 8.6–10.4)
Glucose, Bld: 108 mg/dL — ABNORMAL HIGH (ref 65–99)
Potassium: 4.3 mmol/L (ref 3.5–5.3)
SODIUM: 139 mmol/L (ref 135–146)
TOTAL PROTEIN: 6.8 g/dL (ref 6.1–8.1)

## 2015-07-16 LAB — LIPID PANEL
CHOL/HDL RATIO: 5.9 ratio — AB (ref <=5.0)
CHOLESTEROL: 206 mg/dL — AB (ref 125–200)
HDL: 35 mg/dL — ABNORMAL LOW (ref >=46)
TRIGLYCERIDES: 444 mg/dL — AB (ref <150)

## 2015-07-16 LAB — CBC WITH DIFFERENTIAL/PLATELET
BASOS ABS: 59 {cells}/uL (ref 0–200)
BASOS PCT: 1 %
EOS PCT: 3 %
Eosinophils Absolute: 177 cells/uL (ref 15–500)
HCT: 33.9 % — ABNORMAL LOW (ref 35.0–45.0)
HEMOGLOBIN: 10.8 g/dL — AB (ref 11.7–15.5)
LYMPHS ABS: 1829 {cells}/uL (ref 850–3900)
Lymphocytes Relative: 31 %
MCH: 28.7 pg (ref 27.0–33.0)
MCHC: 31.9 g/dL — ABNORMAL LOW (ref 32.0–36.0)
MCV: 90.2 fL (ref 80.0–100.0)
MPV: 10.2 fL (ref 7.5–12.5)
Monocytes Absolute: 708 cells/uL (ref 200–950)
Monocytes Relative: 12 %
NEUTROS ABS: 3127 {cells}/uL (ref 1500–7800)
Neutrophils Relative %: 53 %
Platelets: 267 10*3/uL (ref 140–400)
RBC: 3.76 MIL/uL — ABNORMAL LOW (ref 3.80–5.10)
RDW: 13.8 % (ref 11.0–15.0)
WBC: 5.9 10*3/uL (ref 3.8–10.8)

## 2015-07-16 LAB — TSH: TSH: 3.96 mIU/L

## 2015-07-16 LAB — POCT URINALYSIS DIPSTICK
Bilirubin, UA: NEGATIVE
Glucose, UA: NEGATIVE
Ketones, UA: NEGATIVE
NITRITE UA: NEGATIVE
PH UA: 6.5
PROTEIN UA: NEGATIVE
RBC UA: NEGATIVE
UROBILINOGEN UA: NEGATIVE

## 2015-07-16 MED ORDER — ESTRADIOL 10 MCG VA TABS: ORAL_TABLET | VAGINAL | Status: DC

## 2015-07-16 MED ORDER — VITAMIN D (ERGOCALCIFEROL) 1.25 MG (50000 UNIT) PO CAPS: 50000.0000 [IU] | ORAL_CAPSULE | ORAL | Status: DC

## 2015-07-16 NOTE — Progress Notes (Signed)
Patient ID: [name] [name], female   DOB: 06/19/1950, 65 y.o.   MRN: [id number]  65 y.o. [id number] Legally Separated  Caucasian Fe here for annual exam.  No new health problems.  Her pessary that was removed on 04/16/15 due to excoriation and erythema is still out.  She has done well since it has been out and would like to keep it out if possible.  She still uses Vagifem usually only 1 times a week if that due to cost.  She denies urine leakage.  Still having to help sister [name] [name] - who just had a PET scan and looks like cancer is gone!!  Patient has noted a skin lesion on top of left hand.  It has now grown in size and red.  Occasionally has drainage - will get derm referral. She has pictures of great grandson - looks precious. The mother is our patient.  She remains very connected to her children and her estranged husband still comes over for family dinner on Sundays.     Patient's last menstrual period was 11/25/2002 (approximate).          Sexually active: No.  The current method of family planning is abstinence.    Exercising: No regular exercise, occasional walking Smoker:  no  Health Maintenance: Pap: 07/11/14, Negative with neg HR HPV MMG: 06/30/14, Bi-Rads 2: Benign  Will call and schedule Colonoscopy: 6 8/16, Tubular Adenoma, repeat in 5 years BMD:  03/2010, 1.0 Spine / -0.2 Right Femur Neck / 2.4 Left Femur Neck TDaP: Today Shingles: Never Pneumonia: 08/03/12 Hep C and HIV: done today Labs: HB: 10.7   Urine: 1+ Leuks   reports that she has never smoked. She has never used smokeless tobacco. She reports that she does not drink alcohol or use illicit drugs.  Past Medical History  Diagnosis Date  . Hyperlipidemia   . GERD (gastroesophageal reflux disease)   . HTN (hypertension) 2005  . Biliary dyskinesia 07/2006  . Urinary, incontinence, stress female 12/13    using Pessary 01/2012  . Gastroparesis 7/08    Dr. Collene Mares  . Diverticulosis     Hx: of  . Depression   .  Pneumonia 1990's X 1  . OSA on CPAP   . Type II diabetes mellitus ([place]) 2005  . Arthritis     "hands, neck, right knee" (11/29/2013)  . Cancer, uterine ([place]) 05/2003    F1G01,G1    Past Surgical History  Procedure Laterality Date  . Tubal ligation    . Knee arthroscopy Left 2010  . Colonoscopy w/ biopsies  10/04    polyp  . Colonoscopy w/ biopsies  02/12/05    benign polyp  . Colonoscopy with esophagogastroduodenoscopy (egd)  09/14/06    2 polyps, diverticula, diffuse gastritis  . Colonoscopy w/ biopsies  10/26/09    1 polyp benign, recheck 5 years  . Total knee arthroplasty Left 08/02/2012    Procedure: TOTAL KNEE ARTHROPLASTY;  Surgeon: Alta Corning, MD;  Location: [facility];  Service: Orthopedics;  Laterality: Left;  left total knee arthroplasty  . Knee arthroscopy Right 11/2012  . Total hip arthroplasty Right 09/02/2013    Procedure: RIGHT TOTAL HIP ARTHROPLASTY ANTERIOR APPROACH;  Surgeon: Alta Corning, MD;  Location: [facility];  Service: Orthopedics;  Laterality: Right;  . Cardiac catheterization  11/29/2013  . Laparoscopic cholecystectomy  10/2006  . Joint replacement    . Total abdominal hysterectomy  05/2003    w/BSO; endometrial carcinoma F1G01, G1  .  Left heart catheterization with coronary angiogram N/A 11/29/2013    Procedure: LEFT HEART CATHETERIZATION WITH CORONARY ANGIOGRAM;  Surgeon: Laverda Page, MD;  Location: [facility];  Service: Cardiovascular;  Laterality: N/A;    Current Outpatient Prescriptions  Medication Sig Dispense Refill  . amLODipine (NORVASC) 10 MG tablet Take 1 tablet (10 mg total) by mouth daily. 30 tablet 2  . atorvastatin (LIPITOR) 80 MG tablet Take 1 tablet by mouth daily.  5  . benazepril (LOTENSIN) 40 MG tablet Take 40 mg by mouth daily.    . carvedilol (COREG) 25 MG tablet Take 25 mg by mouth 2 (two) times daily with a meal.    . chlorthalidone (HYGROTON) 25 MG tablet 1 table once daily  3  . escitalopram (LEXAPRO) 20 MG tablet Take 10 mg by mouth  daily.    . Estradiol (VAGIFEM) 10 MCG TABS vaginal tablet INSERT 1/2 TABLET VAGINALLY TWICE A WEEK ON SUNDAY AND WEDNESDAY 24 tablet 4  . fluticasone (FLONASE) 50 MCG/ACT nasal spray as needed.  6  . glimepiride (AMARYL) 2 MG tablet Take 1 tablet by mouth daily.  5  . meloxicam (MOBIC) 15 MG tablet Take 15 mg by mouth daily.    . metFORMIN (GLUCOPHAGE) 1000 MG tablet Take 1 tablet (1,000 mg total) by mouth 2 (two) times daily with a meal.    . omeprazole (PRILOSEC) 20 MG capsule Take 20 mg by mouth 2 (two) times daily before a meal.     . sitaGLIPtin (JANUVIA) 100 MG tablet Take 100 mg by mouth daily.    . TRILIPIX 135 MG capsule Take 1 tablet by mouth daily.  1  . Vitamin D, Ergocalciferol, (DRISDOL) 50000 units CAPS capsule Take 1 capsule (50,000 Units total) by mouth once a week. 30 capsule 3   No current facility-administered medications for this visit.    Family History  Problem Relation Age of Onset  . Heart disease Father 47    CABG  . Lung cancer Father 31    lung  . Hypertension Father   . Diabetes Father   . Heart disease Sister 90    CABG  . Diabetes Sister 38  . Heart disease Brother 6    MI with stents  . Pancreatic cancer Other     x 3 - 2 MAunts and 1 P Aunts.   . Lupus Other     Pat Aunt  . Hypertension Mother   . Diabetes Mother   . Hypertension Sister     x2   . Diabetes Sister   . Hypertension Brother   . Diabetes Brother   . Cancer Sister     high grade Neuroendocrine carcinoma right axilla    ROS:  Pertinent items are noted in HPI.  Otherwise, a comprehensive ROS was negative.  Exam:   BP 128/64 mmHg  Pulse 60  Ht 5\' 3"  (1.6 m)  Wt 258 lb (117.028 kg)  BMI 45.71 kg/m2  LMP 11/25/2002 (Approximate) Height: 5\' 3"  (160 cm) Ht Readings from Last 3 Encounters:  07/16/15 5\' 3"  (1.6 m)  04/30/15 5\' 3"  (1.6 m)  04/16/15 5\' 3"  (1.6 m)    General appearance: alert, cooperative and appears stated age Head: Normocephalic, without obvious  abnormality, atraumatic Neck: no adenopathy, supple, symmetrical, trachea midline and thyroid normal to inspection and palpation Lungs: clear to auscultation bilaterally Breasts: normal appearance, no masses or tenderness Heart: regular rate and rhythm Abdomen: soft, non-tender; no masses,  no organomegaly Extremities: extremities  normal, atraumatic, no cyanosis or edema Skin: Skin color, texture, turgor normal. No rashes or lesions, left hand raised 4 mm lesion that is pink with a central punctate lesion.  No discharge Lymph nodes: Cervical, supraclavicular, and axillary nodes normal. No abnormal inguinal nodes palpated Neurologic: Grossly normal   Pelvic: External genitalia:  no lesions              Urethra:  normal appearing urethra with no masses, tenderness or lesions              Bartholin's and Skene's: normal                 Vagina: normal appearing vagina with normal color and discharge, no lesions              Cervix: absent              Pap taken: No. Bimanual Exam:  Uterus:  uterus absent              Adnexa: no mass, fullness, tenderness               Rectovaginal: Confirms               Anus:  normal sphincter tone, no lesions  Chaperone present: no  A:  Well Woman with normal exam  Postmenopausal S/P TAH/ BSO secondary to endometrial cancer grade 1 05/2003 Urinary incontinence with use of size #4 pessary - now out since 04/16/15 Vit D deficiency History of HTN, Diabetes, OA  Anemia  Change is skin lesion left hand   P:   Reviewed health and wellness pertinent to exam  Pap smear as above  Mammogram is due now and will get BMD at same time -order is faxed to [facility] size #4 ring with support if needed will need to be replaced as old one was destroyed - due to discoloration and odor.  Refill on Vagifem  Counseled with risk of DVT, CVA, cancer, etc.  Referral to dermatologist about skin lesion on hand  as this may be a skin cancer - does not want to see previous person in [place].  Counseled on breast self exam, mammography screening, adequate intake of calcium and vitamin D, diet and exercise, Kegel's exercises return annually or prn  An After Visit Summary was printed and given to the patient.   Patient received Tdap vaccine during office visit. Vaccine was administered in right deltoid. Patient tolerated injection well.

## 2015-07-16 NOTE — Patient Instructions (Addendum)

## 2015-07-17 DIAGNOSIS — Z23 Encounter for immunization: Secondary | ICD-10-CM | POA: Diagnosis not present

## 2015-07-17 LAB — HEPATITIS C ANTIBODY: HCV AB: NEGATIVE

## 2015-07-17 LAB — HEMOGLOBIN A1C
Hgb A1c MFr Bld: 7 % — ABNORMAL HIGH (ref <5.7)
MEAN PLASMA GLUCOSE: 154 mg/dL

## 2015-07-17 LAB — VITAMIN D 25 HYDROXY (VIT D DEFICIENCY, FRACTURES): Vit D, 25-Hydroxy: 34 ng/mL (ref 30–100)

## 2015-07-17 LAB — HIV ANTIBODY (ROUTINE TESTING W REFLEX): HIV: NONREACTIVE

## 2015-07-23 NOTE — Progress Notes (Signed)
Encounter reviewed by Dr. Brook Amundson C. Silva.  

## 2015-08-01 ENCOUNTER — Encounter: Payer: Self-pay | Admitting: Pulmonary Disease

## 2015-08-01 DIAGNOSIS — G4733 Obstructive sleep apnea (adult) (pediatric): Secondary | ICD-10-CM

## 2015-08-03 ENCOUNTER — Telehealth: Payer: Self-pay | Admitting: Nurse Practitioner

## 2015-08-03 NOTE — Telephone Encounter (Signed)
Please call pt with BMD results from 08/01/2015.  The T Score for spine is +1.50; left hip neck +0.30; left radius +1.70.  All her measurements fall in the normal range.  She needs to continue calcium and Vit D support.  Will repeat in 5 yrs.

## 2015-08-06 NOTE — Telephone Encounter (Signed)
Patient notified.  Verbalized understanding. 

## 2015-08-21 ENCOUNTER — Encounter: Payer: Self-pay | Admitting: Nurse Practitioner

## 2015-08-27 ENCOUNTER — Encounter: Payer: Self-pay | Admitting: Adult Health

## 2015-08-27 DIAGNOSIS — G4733 Obstructive sleep apnea (adult) (pediatric): Secondary | ICD-10-CM

## 2015-12-06 ENCOUNTER — Other Ambulatory Visit: Payer: Self-pay | Admitting: Obstetrics and Gynecology

## 2015-12-06 ENCOUNTER — Other Ambulatory Visit: Payer: Self-pay | Admitting: Nurse Practitioner

## 2015-12-06 NOTE — Telephone Encounter (Signed)
Medication refill request: Vitamin D 50000 Last AEX:  07/16/15 PG Next AEX: 07/16/16 Last MMG (if hormonal medication request): 08/01/15 BIRADS 1 negative Refill authorized: 07/16/15 #30 w/3 refills; today please advise in the absence of PG

## 2015-12-06 NOTE — Telephone Encounter (Signed)
There is a duplicate prescription.  Please remove one of the vit D prescriptions so I can sign the remaining one.  Thanks.

## 2015-12-14 ENCOUNTER — Ambulatory Visit (INDEPENDENT_AMBULATORY_CARE_PROVIDER_SITE_OTHER): Payer: Medicaid Other | Admitting: Adult Health

## 2015-12-14 ENCOUNTER — Encounter: Payer: Self-pay | Admitting: Adult Health

## 2015-12-14 DIAGNOSIS — G4733 Obstructive sleep apnea (adult) (pediatric): Secondary | ICD-10-CM | POA: Diagnosis not present

## 2015-12-14 DIAGNOSIS — Z23 Encounter for immunization: Secondary | ICD-10-CM | POA: Diagnosis not present

## 2015-12-14 NOTE — Assessment & Plan Note (Signed)
Well controlled on CPAP  Some daytime sleepiness ? Related to meds, advised to discuss with PCP  . Call back if not improving   Plan  Patient Instructions  Continue on CPAP At bedtime   Keep up good work  Work on weight loss  Do not drive if sleepy .  Call back if sleepiness keeps up .  Check to see if your meds are making your sleepiness/tiredness worse.  Follow up Dr. Elsworth Soho  In 6 months and As needed

## 2015-12-14 NOTE — Progress Notes (Signed)
Subjective:    Patient ID: [name] [name], female    DOB: 06-08-1950, 65 y.o.   MRN: [id number]  HPI 65 year old female followed for severe obstructive sleep apnea  TEST  NPSG 02/2012:  AHI 37/hr, desat to 78% Auto 2014:  Optimal pressure 16cm. 12/2012:  Decrease cpap to 14cm (10 pound weight loss).    12/14/2015 follow-up sleep apnea Patient returns for a one-year follow-up for sleep apnea. Patient says she is doing well on C Pap at bedtime. Does have some daytime sleepiness.  Download shows excellent compliance with good control. Average usage at 7 hours. AHI 5.7. She needs supplies sent to DME.  Denies any chest pain, orthopnea, PND, or increased leg swelling  Past Medical History:  Diagnosis Date  . Arthritis    "hands, neck, right knee" (11/29/2013)  . Biliary dyskinesia 07/2006  . Cancer, uterine ([place]) 05/2003   F1G01,G1  . Depression   . Diverticulosis    Hx: of  . Gastroparesis 7/08   Dr. Collene Mares  . GERD (gastroesophageal reflux disease)   . HTN (hypertension) 2005  . Hyperlipidemia   . OSA on CPAP   . Pneumonia 1990's X 1  . Type II diabetes mellitus ([place]) 2005  . Urinary, incontinence, stress female 12/13   using Pessary 01/2012     Review of Systems Constitutional:   No  weight loss, night sweats,  Fevers, chills, + fatigue, or  lassitude.  HEENT:   No headaches,  Difficulty swallowing,  Tooth/dental problems, or  Sore throat,                No sneezing, itching, ear ache, nasal congestion, post nasal drip,   CV:  No chest pain,  Orthopnea, PND, swelling in lower extremities, anasarca, dizziness, palpitations, syncope.   GI  No heartburn, indigestion, abdominal pain, nausea, vomiting, diarrhea, change in bowel habits, loss of appetite, bloody stools.   Resp: No shortness of breath with exertion or at rest.  No excess mucus, no productive cough,  No non-productive cough,  No coughing up of blood.  No change in color of mucus.  No wheezing.  No chest wall  deformity  Skin: no rash or lesions.  GU: no dysuria, change in color of urine, no urgency or frequency.  No flank pain, no hematuria   MS:  No joint pain or swelling.  No decreased range of motion.  No back pain.  Psych:  No change in mood or affect. No depression or anxiety.  No memory loss.         Objective:   Physical Exam Vitals:   12/14/15 1008  BP: 122/62  Pulse: 65  Temp: 98.4 F (36.9 C)  TempSrc: Oral  SpO2: 97%  Weight: 254 lb 6.4 oz (115.4 kg)  Height: 5\' 3"  (1.6 m)  Body mass index is 45.06 kg/m.   GEN: A/Ox3; pleasant , NAD, well nourished    HEENT:  [place]/AT,  EACs-clear, TMs-wnl, NOSE-clear, THROAT-clear, no lesions, no postnasal drip or exudate noted. Class 2-3 MP airway   NECK:  Supple w/ fair ROM; no JVD; normal carotid impulses w/o bruits; no thyromegaly or nodules palpated; no lymphadenopathy.    RESP  Clear  P & A; w/o, wheezes/ rales/ or rhonchi. no accessory muscle use, no dullness to percussion  CARD:  RRR, no m/r/g  , no peripheral edema, pulses intact, no cyanosis or clubbing.  GI:   Soft & nt; nml bowel sounds; no organomegaly or masses detected.  Musco: Warm bil, no deformities or joint swelling noted.   Neuro: alert, no focal deficits noted.    Skin: Warm, no lesions or rashes  Tammy Parrett NP-C  [facility]  12/14/2015        Assessment & Plan:

## 2015-12-14 NOTE — Patient Instructions (Signed)
Continue on CPAP At bedtime   Keep up good work  Work on [employer] loss  Do not drive if sleepy .  Call back if sleepiness keeps up .  Check to see if your meds are making your sleepiness/tiredness worse.  Follow up Dr. Elsworth Soho  In 6 months and As needed

## 2015-12-20 ENCOUNTER — Encounter: Payer: Self-pay | Admitting: Adult Health

## 2015-12-20 NOTE — Progress Notes (Signed)
Reviewed & agree with plan  

## 2016-02-12 DIAGNOSIS — K760 Fatty (change of) liver, not elsewhere classified: Secondary | ICD-10-CM | POA: Insufficient documentation

## 2016-03-19 ENCOUNTER — Encounter: Payer: Self-pay | Admitting: Adult Health

## 2016-03-19 DIAGNOSIS — G4733 Obstructive sleep apnea (adult) (pediatric): Secondary | ICD-10-CM

## 2016-06-04 ENCOUNTER — Encounter: Payer: Self-pay | Admitting: Pulmonary Disease

## 2016-06-04 ENCOUNTER — Ambulatory Visit (INDEPENDENT_AMBULATORY_CARE_PROVIDER_SITE_OTHER): Payer: Medicaid Other | Admitting: Pulmonary Disease

## 2016-06-04 DIAGNOSIS — G4710 Hypersomnia, unspecified: Secondary | ICD-10-CM

## 2016-06-04 DIAGNOSIS — G4733 Obstructive sleep apnea (adult) (pediatric): Secondary | ICD-10-CM

## 2016-06-04 MED ORDER — MODAFINIL 100 MG PO TABS
100.0000 mg | ORAL_TABLET | Freq: Every day | ORAL | 0 refills | Status: DC
Start: 2016-06-04 — End: 2016-07-28

## 2016-06-04 MED ORDER — MODAFINIL 100 MG PO TABS: ORAL_TABLET | ORAL | 0 refills | Status: DC

## 2016-06-04 NOTE — Addendum Note (Signed)
Addended by: Valerie Salts on: 06/04/2016 10:03 AM   Modules accepted: Orders

## 2016-06-04 NOTE — Patient Instructions (Signed)
Trial of Provigil / Nuvigil 150 mg at 7 AM daily  -okay to not use it on weekends  Call us back in 1 week to report how this is helping you   CPAP titration study

## 2016-06-04 NOTE — Addendum Note (Signed)
Addended by: Valerie Salts on: 06/04/2016 10:05 AM   Modules accepted: Orders

## 2016-06-04 NOTE — Assessment & Plan Note (Signed)
Remains sleepy in spite of good CPAP usage and control of events  Trial of Provigil / Nuvigil 150 mg at 7 AM daily  -okay to not use it on weekends  Call us back in 1 week to report how this is helping  We discussed side effects including palpitations

## 2016-06-04 NOTE — Assessment & Plan Note (Signed)
CPAP titration study  Based on this we will make adjustments to her CPAP machine, change pressure if required  Weight loss encouraged, compliance with goal of at least 4-6 hrs every night is the expectation. Advised against medications with sedative side effects Cautioned against driving when sleepy - understanding that sleepiness will vary on a day to day basis

## 2016-06-04 NOTE — Progress Notes (Signed)
   Subjective:    Patient ID: [name] [name], female    DOB: 1950/07/30, 66 y.o.   MRN: [id number]  HPI  66 year old Diabetic, hypertensive for follow-up of severe obstructive sleep apnea  She remains compliant with her CPAP which is set at 14 cm. Download reviewed from the past which have shown excellent compliance about 7 hours per night with good control of events is residual AHI about 5 events per hour She denies any problems with mask and pressure and really likes her machine  In spite of this she continues to remain sleepy, no snoring has been noted by family members. She drives her grandson to school around 8 AM and mother time she comes fact she is sleepy again. She keeps a great grand child and is unable to stay awake all the time     Significant tests/ events  NPSG 02/2012:  AHI 37/hr, desat to 78% Auto 2014:  Optimal pressure 16cm. 12/2012:  Decrease cpap to 14cm (10 pound weight loss).    Review of Systems neg for any significant sore throat, dysphagia, itching, sneezing, nasal congestion or excess/ purulent secretions, fever, chills, sweats, unintended wt loss, pleuritic or exertional cp, hempoptysis, orthopnea pnd or change in chronic leg swelling. Also denies presyncope, palpitations, heartburn, abdominal pain, nausea, vomiting, diarrhea or change in bowel or urinary habits, dysuria,hematuria, rash, arthralgias, visual complaints, headache, numbness weakness or ataxia.     Objective:   Physical Exam   Gen. Pleasant, obese, in no distress ENT - no lesions, no post nasal drip Neck: No JVD, no thyromegaly, no carotid bruits Lungs: no use of accessory muscles, no dullness to percussion, decreased without rales or rhonchi  Cardiovascular: Rhythm regular, heart sounds  normal, no murmurs or gallops, no peripheral edema Musculoskeletal: No deformities, no cyanosis or clubbing , no tremors        Assessment & Plan:

## 2016-06-06 ENCOUNTER — Encounter: Payer: Self-pay | Admitting: Pulmonary Disease

## 2016-06-06 NOTE — Telephone Encounter (Signed)
Called [phone] to initiate PA for Modafinil  Pt I.D# [id number] Confirmation # [id number]  Will forward to Cherina to follow up on

## 2016-06-09 ENCOUNTER — Telehealth: Payer: Self-pay

## 2016-06-09 NOTE — Telephone Encounter (Signed)
PA for Provigil was approved. Pharmacy is aware. Patient is aware. Nothing else is needed at time of call.

## 2016-06-20 ENCOUNTER — Encounter: Payer: Self-pay | Admitting: Pulmonary Disease

## 2016-06-20 ENCOUNTER — Ambulatory Visit: Payer: Medicaid Other | Attending: Pulmonary Disease | Admitting: Pulmonary Disease

## 2016-06-20 DIAGNOSIS — G4733 Obstructive sleep apnea (adult) (pediatric): Secondary | ICD-10-CM | POA: Insufficient documentation

## 2016-06-26 ENCOUNTER — Telehealth: Payer: Self-pay | Admitting: Pulmonary Disease

## 2016-06-26 ENCOUNTER — Other Ambulatory Visit: Payer: Self-pay

## 2016-06-26 DIAGNOSIS — G4730 Sleep apnea, unspecified: Secondary | ICD-10-CM | POA: Diagnosis not present

## 2016-06-26 DIAGNOSIS — G4733 Obstructive sleep apnea (adult) (pediatric): Secondary | ICD-10-CM

## 2016-06-26 NOTE — Telephone Encounter (Signed)
Order has been placed.

## 2016-06-26 NOTE — Telephone Encounter (Signed)
Increase CPAP to 16 cm

## 2016-06-26 NOTE — Procedures (Signed)
Patient Name: [name], [name] Date: 06/20/2016 Gender: Female D.O.B: 1950-04-28 Age (years): 6 Referring Provider: Kara Mead MD, ABSM Height (inches): 65 Interpreting Physician: Kara Mead MD, ABSM Weight (lbs): 253 RPSGT: Peak, Robert BMI: 42 MRN: [id number] Neck Size: 16.50   CLINICAL INFORMATION The patient is referred for a BiPAP titration to treat sleep apnea.  Date of NPSG: 02/2012:  AHI 37/hr, desat to 78% Auto 2014:  Optimal pressure 16cm.  SLEEP STUDY TECHNIQUE As per the AASM Manual for the Scoring of Sleep and Associated Events v2.3 (April 2016) with a hypopnea requiring 4% desaturations.  The channels recorded and monitored were frontal, central and occipital EEG, electrooculogram (EOG), submentalis EMG (chin), nasal and oral airflow, thoracic and abdominal wall motion, anterior tibialis EMG, snore microphone, electrocardiogram, and pulse oximetry. Bilevel positive airway pressure (BPAP) was initiated at the beginning of the study and titrated to treat sleep-disordered breathing.  RESPIRATORY PARAMETERS Optimal IPAP Pressure (cm): 16 AHI at Optimal Pressure (/hr) 0 Optimal EPAP Pressure (cm): 12     Overall Minimal O2 (%): 79.00 Minimal O2 at Optimal Pressure (%): 79.00   SLEEP ARCHITECTURE Start Time: 9:46:19 PM Stop Time: 5:06:36 AM Total Time (min): 440.3 Total Sleep Time (min): 397.5 Sleep Latency (min): 13.3 Sleep Efficiency (%): 90.3 REM Latency (min): 162.0 WASO (min): 29.4 Stage N1 (%): 9.81 Stage N2 (%): 82.01 Stage N3 (%): 0.00 Stage R (%): 8.18 Supine (%): 0.00 Arousal Index (/hr): 19.8       CARDIAC DATA The 2 lead EKG demonstrated sinus rhythm. The mean heart rate was 57.73 beats per minute. Other EKG findings include: None.   LEG MOVEMENT DATA The total Periodic Limb Movements of Sleep (PLMS) were 146. The PLMS index was 22.04. A PLMS index of <15 is considered normal in adults.  IMPRESSIONS - An optimal PAP pressure was selected for  this patient ( 16 / 12 cm of water). CPAP of 14 cm was not adequate & events persisted. - Central sleep apnea was not noted during this titration (CAI = 0.9/h). - Severe oxygen desaturations were observed during this titration (min O2 = 79.00%). - The patient snored with Soft snoring volume. - No cardiac abnormalities were observed during this study. - Mild periodic limb movements were observed during this study. Arousals associated with PLMs were rare.   DIAGNOSIS - Obstructive Sleep Apnea (327.23 [G47.33 ICD-10])   RECOMMENDATIONS - Trial of BiPAP therapy on 16/12 cm H2O with a Medium size Resmed Full Face Mask AirFit F20 mask and heated humidification. - Avoid alcohol, sedatives and other CNS depressants that may worsen sleep apnea and disrupt normal sleep architecture. - Sleep hygiene should be reviewed to assess factors that may improve sleep quality. - Weight management and regular exercise should be initiated or continued. - Return to Sleep Center for re-evaluation after 4 weeks of therapy    Kara Mead MD Board Certified in [place]

## 2016-07-01 ENCOUNTER — Encounter: Payer: Self-pay | Admitting: Pulmonary Disease

## 2016-07-16 ENCOUNTER — Ambulatory Visit: Payer: Medicaid Other | Admitting: Nurse Practitioner

## 2016-07-27 ENCOUNTER — Encounter: Payer: Self-pay | Admitting: Pulmonary Disease

## 2016-07-28 MED ORDER — MODAFINIL 100 MG PO TABS: 100.0000 mg | ORAL_TABLET | Freq: Every day | ORAL | 5 refills | Status: DC

## 2016-08-19 ENCOUNTER — Encounter: Payer: Self-pay | Admitting: Nurse Practitioner

## 2016-08-19 ENCOUNTER — Other Ambulatory Visit: Payer: Self-pay | Admitting: Nurse Practitioner

## 2016-08-19 ENCOUNTER — Ambulatory Visit (INDEPENDENT_AMBULATORY_CARE_PROVIDER_SITE_OTHER): Payer: Medicaid Other | Admitting: Nurse Practitioner

## 2016-08-19 VITALS — BP 130/74 | HR 64 | Ht 63.0 in | Wt 253.0 lb

## 2016-08-19 DIAGNOSIS — Z0000 Encounter for general adult medical examination without abnormal findings: Secondary | ICD-10-CM

## 2016-08-19 DIAGNOSIS — Z01419 Encounter for gynecological examination (general) (routine) without abnormal findings: Secondary | ICD-10-CM

## 2016-08-19 DIAGNOSIS — E559 Vitamin D deficiency, unspecified: Secondary | ICD-10-CM

## 2016-08-19 MED ORDER — VITAMIN D (ERGOCALCIFEROL) 1.25 MG (50000 UNIT) PO CAPS: 50000.0000 [IU] | ORAL_CAPSULE | ORAL | 1 refills | Status: DC

## 2016-08-19 MED ORDER — ESTRADIOL 10 MCG VA TABS: ORAL_TABLET | VAGINAL | 4 refills | Status: DC

## 2016-08-19 NOTE — Progress Notes (Signed)
Patient ID: [name] [name], female   DOB: 11/17/1950, 66 y.o.   MRN: [id number] th 66 y.o. [id number] Legally Separated  Caucasian Fe here for annual exam.  Pt continues to feel well.  She is not having any urgency or frequency above normal for her.  The pessary has now been out since 03/2015 and she is OK with choice. She does continue to use Vagifem about every 1-2 weeks as that helps her with dryness. Recent HGB AIC was 6.9 on 08/13/16.   She had another sleep study and she could barely stay awake during the day.  The amount of oxygen did drop and required a higher pressure.  She was also given Provigil and that was helpful.    Her husband; with whom they have been separated has pulmonary fibrosis and has had to move back into the home so that she can care for him. The grand daughter who has just had a second baby has now separated from her husband.  He stated he did not want any children and is living with another woman.    Patient's last menstrual period was 11/25/2002 (approximate).          Sexually active: No.  The current method of family planning is abstinence. Hysterectomy due to UTE cancer 05/2003 Exercising: No.  The patient does not participate in regular exercise at present. Smoker:  no  Health Maintenance: Pap: 07/11/14, Negative with neg HR HPV  12/25/11, Negative with neg HR HPV History of Abnormal Pap: no MMG: 08/08/16, 3D-yes, Density Category 2, benign Self Breast exams: yes Colonoscopy: 08/02/14, Tubular Adenoma, repeat in 5 years BMD: 08/01/15 T Score: +1.50 Spine / +0.30 Left Femur Neck / +1.70 Left 1/3 Radius TDaP: 07/17/15 Shingles: Never - due to cost Pneumonia: 08/03/12 Pneumovax Hep C and HIV: 07/16/15 Labs: PCP takes care of labs    reports that she has never smoked. She has never used smokeless tobacco. She reports that she does not drink alcohol or use drugs.  Past Medical History:  Diagnosis Date  . Arthritis    "hands, neck, right knee" (11/29/2013)  . Biliary  dyskinesia 07/2006  . Cancer, uterine ([place]) 05/2003   F1G01,G1  . Depression   . Diverticulosis    Hx: of  . Gastroparesis 7/08   Dr. Collene Mares  . GERD (gastroesophageal reflux disease)   . HTN (hypertension) 2005  . Hyperlipidemia   . OSA on CPAP   . Pneumonia 1990's X 1  . Type II diabetes mellitus ([place]) 2005  . Urinary, incontinence, stress female 12/13   using Pessary 01/2012    Past Surgical History:  Procedure Laterality Date  . CARDIAC CATHETERIZATION  11/29/2013  . COLONOSCOPY W/ BIOPSIES  10/04   polyp  . COLONOSCOPY W/ BIOPSIES  02/12/05   benign polyp  . COLONOSCOPY W/ BIOPSIES  10/26/09   1 polyp benign, recheck 5 years  . COLONOSCOPY WITH ESOPHAGOGASTRODUODENOSCOPY (EGD)  09/14/06   2 polyps, diverticula, diffuse gastritis  . JOINT REPLACEMENT    . KNEE ARTHROSCOPY Left 2010  . KNEE ARTHROSCOPY Right 11/2012  . LAPAROSCOPIC CHOLECYSTECTOMY  10/2006  . LEFT HEART CATHETERIZATION WITH CORONARY ANGIOGRAM N/A 11/29/2013   Procedure: LEFT HEART CATHETERIZATION WITH CORONARY ANGIOGRAM;  Surgeon: Laverda Page, MD;  Location: [facility] CATH LAB;  Service: Cardiovascular;  Laterality: N/A;  . TOTAL ABDOMINAL HYSTERECTOMY  05/2003   w/BSO; endometrial carcinoma F1G01, G1  . TOTAL HIP ARTHROPLASTY Right 09/02/2013   Procedure: RIGHT TOTAL HIP ARTHROPLASTY ANTERIOR  APPROACH;  Surgeon: Alta Corning, MD;  Location: [facility];  Service: Orthopedics;  Laterality: Right;  . TOTAL KNEE ARTHROPLASTY Left 08/02/2012   Procedure: TOTAL KNEE ARTHROPLASTY;  Surgeon: Alta Corning, MD;  Location: [place];  Service: Orthopedics;  Laterality: Left;  left total knee arthroplasty  . TUBAL LIGATION      Current Outpatient Prescriptions  Medication Sig Dispense Refill  . amLODipine (NORVASC) 10 MG tablet Take 1 tablet (10 mg total) by mouth daily. 30 tablet 2  . atorvastatin (LIPITOR) 80 MG tablet Take 1 tablet by mouth daily.  5  . benazepril (LOTENSIN) 40 MG tablet Take 40 mg by mouth daily.    .  carvedilol (COREG) 25 MG tablet Take 25 mg by mouth 2 (two) times daily with a meal.    . chlorthalidone (HYGROTON) 25 MG tablet 1 table once daily  3  . escitalopram (LEXAPRO) 20 MG tablet Take 10 mg by mouth daily.    . Estradiol (VAGIFEM) 10 MCG TABS vaginal tablet INSERT 1/2 TABLET VAGINALLY TWICE A WEEK ON SUNDAY AND WEDNESDAY 24 tablet 4  . fluticasone (FLONASE) 50 MCG/ACT nasal spray as needed.  6  . glimepiride (AMARYL) 4 MG tablet Take 4 mg by mouth daily with breakfast.    . meloxicam (MOBIC) 15 MG tablet Take 15 mg by mouth daily.    . metFORMIN (GLUCOPHAGE) 1000 MG tablet Take 1 tablet (1,000 mg total) by mouth 2 (two) times daily with a meal.    . modafinil (PROVIGIL) 100 MG tablet Take 1 tablet (100 mg total) by mouth daily. 30 tablet 5  . omeprazole (PRILOSEC) 20 MG capsule Take 20 mg by mouth 2 (two) times daily before a meal.     . sitaGLIPtin (JANUVIA) 100 MG tablet Take 100 mg by mouth daily.    . TRILIPIX 135 MG capsule Take 1 tablet by mouth daily.  1  . Vitamin D, Ergocalciferol, (DRISDOL) 50000 units CAPS capsule TAKE 1 CAPSULE BY MOUTH EVERY WEEK 30 capsule 0   No current facility-administered medications for this visit.     Family History  Problem Relation Age of Onset  . Heart disease Father 62       CABG  . Lung cancer Father 2       lung  . Hypertension Father   . Diabetes Father   . Heart disease Sister 54       CABG  . Diabetes Sister 63  . Heart disease Brother 70       MI with stents  . Pancreatic cancer Other        x 3 - 2 MAunts and 1 P Aunts.   . Lupus Other        Pat Aunt  . Hypertension Mother   . Diabetes Mother   . Hypertension Sister        x2   . Diabetes Sister   . Hypertension Brother   . Diabetes Brother   . Cancer Sister        high grade Neuroendocrine carcinoma right axilla    ROS:  Pertinent items are noted in HPI.  Otherwise, a comprehensive ROS was negative.  Exam:   LMP 11/25/2002 (Approximate)    Ht Readings from  Last 3 Encounters:  06/04/16 5\' 3"  (1.6 m)  12/14/15 5\' 3"  (1.6 m)  07/16/15 5\' 3"  (1.6 m)    General appearance: alert, cooperative and appears stated age Head: Normocephalic, without obvious abnormality, atraumatic Neck: no  adenopathy, supple, symmetrical, trachea midline and thyroid normal to inspection and palpation Lungs: clear to auscultation bilaterally Breasts: normal appearance, no masses or tenderness Heart: regular rate and rhythm Abdomen: soft, non-tender; no masses,  no organomegaly Extremities: extremities normal, atraumatic, no cyanosis or edema Skin: Skin color, texture, turgor normal. No rashes or lesions Lymph nodes: Cervical, supraclavicular, and axillary nodes normal. No abnormal inguinal nodes palpated Neurologic: Grossly normal   Pelvic: External genitalia:  no lesions              Urethra:  normal appearing urethra with no masses, tenderness or lesions              Bartholin's and Skene's: normal                 Vagina: normal appearing vagina with normal color and discharge, no lesions              Cervix: absent              Pap taken: No. Bimanual Exam:  Uterus:  uterus absent              Adnexa: no mass, fullness, tenderness               Rectovaginal: Confirms               Anus:  normal sphincter tone, no lesions  Chaperone present: no  A:  Well Woman with normal exam  Postmenopausal  S/P TAH/ BSO secondary to endometrial cancer Grade I 05/2003  Urinary incontinence - prior use of Pessary size # 4 - out since 03/2015 and doing well.  Vit D deficiency  Atrophic vaginitis   P:   Reviewed health and wellness pertinent to exam  Pap smear: no  Mammogram is due 07/2017  Refill on Vit D and will follow with labs  Refill on Vagifem to use 1-2 times a week  Counseled with risk of DVT, CVA, cancer, etc.  Counseled on breast self exam, mammography screening, use and side effects of HRT, adequate intake of calcium and vitamin D, diet and exercise, Kegel's  exercises return annually or prn  An After Visit Summary was printed and given to the patient.

## 2016-08-19 NOTE — Patient Instructions (Signed)

## 2016-08-20 LAB — VITAMIN D 25 HYDROXY (VIT D DEFICIENCY, FRACTURES): VIT D 25 HYDROXY: 51.2 ng/mL (ref 30.0–100.0)

## 2016-08-21 NOTE — Progress Notes (Signed)
Reviewed personally.  M. Suzanne Cobie Marcoux, MD.  

## 2016-11-06 ENCOUNTER — Encounter: Payer: Self-pay | Admitting: Pulmonary Disease

## 2016-11-06 ENCOUNTER — Telehealth: Payer: Self-pay

## 2016-11-06 NOTE — Telephone Encounter (Signed)
Called patients insurance company. Spoke with Apolonio Schneiders. Information given for prior authorization initiation. Coverage for Modanifil was approved through the end of the coverage year. The date of that is 02/05/17. I will contact patients pharmacy and the patient.

## 2016-11-13 DIAGNOSIS — E119 Type 2 diabetes mellitus without complications: Secondary | ICD-10-CM | POA: Diagnosis not present

## 2016-11-13 DIAGNOSIS — K760 Fatty (change of) liver, not elsewhere classified: Secondary | ICD-10-CM | POA: Diagnosis not present

## 2016-11-13 DIAGNOSIS — E782 Mixed hyperlipidemia: Secondary | ICD-10-CM | POA: Diagnosis not present

## 2016-11-13 DIAGNOSIS — Z23 Encounter for immunization: Secondary | ICD-10-CM | POA: Diagnosis not present

## 2016-11-13 DIAGNOSIS — M1711 Unilateral primary osteoarthritis, right knee: Secondary | ICD-10-CM | POA: Diagnosis not present

## 2016-11-13 DIAGNOSIS — I10 Essential (primary) hypertension: Secondary | ICD-10-CM | POA: Diagnosis not present

## 2016-11-24 ENCOUNTER — Telehealth: Payer: Self-pay | Admitting: Pulmonary Disease

## 2016-11-24 NOTE — Telephone Encounter (Signed)
Attempted to call the insurance company at [phone] to initiate the PA for the modafinil 100 mg  Once daily  PT ID#  [id number]  I was on hold to speak with someone to initiate the PA---was hung up on after holding for about 10 mins.  Will call back.

## 2016-11-25 NOTE — Telephone Encounter (Signed)
Called to initiate the PA for the modafinil 100 mg once daily.  This has been approved from 11/06/16 through 02/05/17.  Nothing further is needed.

## 2016-12-03 ENCOUNTER — Ambulatory Visit: Payer: Medicaid Other | Admitting: Adult Health

## 2016-12-05 ENCOUNTER — Ambulatory Visit: Payer: Medicaid Other | Admitting: Adult Health

## 2016-12-09 ENCOUNTER — Ambulatory Visit (INDEPENDENT_AMBULATORY_CARE_PROVIDER_SITE_OTHER): Payer: Medicare Other | Admitting: Pulmonary Disease

## 2016-12-09 VITALS — BP 140/78 | HR 74 | Ht 64.0 in | Wt 254.0 lb

## 2016-12-09 DIAGNOSIS — G4733 Obstructive sleep apnea (adult) (pediatric): Secondary | ICD-10-CM

## 2016-12-09 DIAGNOSIS — G4710 Hypersomnia, unspecified: Secondary | ICD-10-CM | POA: Diagnosis not present

## 2016-12-09 NOTE — Progress Notes (Signed)
Subjective:    Patient ID: [name] [name], female    DOB: 06/02/1950, 66 y.o.   MRN: [id number]  C.C.:  Follow-up for Severe OSA & Hypersomnolence.  HPI Severe OSA: Patient currently followed by Dr. Elsworth Soho. Known severe OSA. BiPAP titration in April recommended 16/12 cm H2O pressure. She isn't sure that she was switched over from CPAP 14 cm H2O to BiPAP. Download indicates her pressure was increased to 16cm H2O. She reports she is using her machine religiously. She reports her quality of sleep seems to vary, especially when she has her grandchildren sleeping over. She reports a rare morning headache. She does nap for about 1 hour during the day, primarily when she is caring for her grandchildren. She doesn't take it every day though. She denies any significant mask leaks. She is using a full face mask & changing regularly.   Hypersomnolence: Approved for Provigil after last appointment. She reports she has had significant improvement in her level of fatigue & alertness. No problems with the medication.   Review of Systems No chest pain or pressure that is new. She reports chronic stomach upset & nausea. No fever or chills.   Allergies  Allergen Reactions  . Codeine Palpitations    Current Outpatient Prescriptions on File Prior to Visit  Medication Sig Dispense Refill  . amLODipine (NORVASC) 10 MG tablet Take 1 tablet (10 mg total) by mouth daily. 30 tablet 2  . atorvastatin (LIPITOR) 80 MG tablet Take 1 tablet by mouth daily.  5  . benazepril (LOTENSIN) 40 MG tablet Take 40 mg by mouth daily.    . carvedilol (COREG) 25 MG tablet Take 25 mg by mouth 2 (two) times daily with a meal.    . chlorthalidone (HYGROTON) 25 MG tablet 1 table once daily  3  . escitalopram (LEXAPRO) 20 MG tablet Take 10 mg by mouth daily.    . Estradiol (VAGIFEM) 10 MCG TABS vaginal tablet INSERT 1/2 TABLET VAGINALLY TWICE A WEEK ON SUNDAY AND WEDNESDAY 24 tablet 4  . fluticasone (FLONASE) 50 MCG/ACT nasal spray as  needed.  6  . glimepiride (AMARYL) 4 MG tablet Take 4 mg by mouth daily with breakfast.    . meloxicam (MOBIC) 15 MG tablet Take 15 mg by mouth daily.    . metFORMIN (GLUCOPHAGE) 1000 MG tablet Take 1 tablet (1,000 mg total) by mouth 2 (two) times daily with a meal.    . modafinil (PROVIGIL) 100 MG tablet Take 1 tablet (100 mg total) by mouth daily. 30 tablet 5  . omeprazole (PRILOSEC) 20 MG capsule Take 20 mg by mouth 2 (two) times daily before a meal.     . sitaGLIPtin (JANUVIA) 100 MG tablet Take 100 mg by mouth daily.    . TRILIPIX 135 MG capsule Take 1 tablet by mouth daily.  1  . Vitamin D, Ergocalciferol, (DRISDOL) 50000 units CAPS capsule Take 1 capsule (50,000 Units total) by mouth once a week. 30 capsule 1   No current facility-administered medications on file prior to visit.     Past Medical History:  Diagnosis Date  . Arthritis    "hands, neck, right knee" (11/29/2013)  . Biliary dyskinesia 07/2006  . Cancer, uterine ([place]) 05/2003   F1G01,G1  . Depression   . Diverticulosis    Hx: of  . Gastroparesis 7/08   Dr. Collene Mares  . GERD (gastroesophageal reflux disease)   . HTN (hypertension) 2005  . Hyperlipidemia   . OSA on CPAP   .  Pneumonia 1990's X 1  . Type II diabetes mellitus ([place]) 2005  . Urinary, incontinence, stress female 12/13   using Pessary 01/2012    Past Surgical History:  Procedure Laterality Date  . CARDIAC CATHETERIZATION  11/29/2013  . COLONOSCOPY W/ BIOPSIES  10/04   polyp  . COLONOSCOPY W/ BIOPSIES  02/12/05   benign polyp  . COLONOSCOPY W/ BIOPSIES  10/26/09   1 polyp benign, recheck 5 years  . COLONOSCOPY WITH ESOPHAGOGASTRODUODENOSCOPY (EGD)  09/14/06   2 polyps, diverticula, diffuse gastritis  . JOINT REPLACEMENT    . KNEE ARTHROSCOPY Left 2010  . KNEE ARTHROSCOPY Right 11/2012  . LAPAROSCOPIC CHOLECYSTECTOMY  10/2006  . LEFT HEART CATHETERIZATION WITH CORONARY ANGIOGRAM N/A 11/29/2013   Procedure: LEFT HEART CATHETERIZATION WITH CORONARY ANGIOGRAM;   Surgeon: Laverda Page, MD;  Location: [facility] CATH LAB;  Service: Cardiovascular;  Laterality: N/A;  . TOTAL ABDOMINAL HYSTERECTOMY  05/2003   w/BSO; endometrial carcinoma F1G01, G1  . TOTAL HIP ARTHROPLASTY Right 09/02/2013   Procedure: RIGHT TOTAL HIP ARTHROPLASTY ANTERIOR APPROACH;  Surgeon: Alta Corning, MD;  Location: [facility];  Service: Orthopedics;  Laterality: Right;  . TOTAL KNEE ARTHROPLASTY Left 08/02/2012   Procedure: TOTAL KNEE ARTHROPLASTY;  Surgeon: Alta Corning, MD;  Location: [place];  Service: Orthopedics;  Laterality: Left;  left total knee arthroplasty  . TUBAL LIGATION      Family History  Problem Relation Age of Onset  . Heart disease Father 60       CABG  . Lung cancer Father 61       lung  . Hypertension Father   . Diabetes Father   . Heart disease Sister 60       CABG  . Diabetes Sister 5  . Heart disease Brother 33       MI with stents  . Hypertension Mother   . Diabetes Mother   . Hypertension Sister        x2   . Diabetes Sister   . Hypertension Brother   . Diabetes Brother   . Cancer Sister        high grade Neuroendocrine carcinoma right axilla  . Pancreatic cancer Other        x 3 - 2 MAunts and 1 P Aunts.   . Lupus Other        Pat Aunt    Social History   Social History  . Marital status: Legally Separated    Spouse name: N/A  . Number of children: 3  . Years of education: N/A   Occupational History  . housewife    Social History Main Topics  . Smoking status: Never Smoker  . Smokeless tobacco: Never Used  . Alcohol use No  . Drug use: No  . Sexual activity: Not Currently   Other Topics Concern  . Not on file   Social History Narrative  . No narrative on file      Objective:   Physical Exam BP 140/78 (BP Location: Left Arm, Cuff Size: Normal)   Pulse 74   Ht 5\' 4"  (1.626 m)   Wt 254 lb (115.2 kg)   LMP 11/25/2002 (Approximate)   SpO2 95%   BMI 43.60 kg/m  General:  Awake. Alert. Obese. No distress.  Integument:   Warm & dry. No rash on exposed skin. No bruising on exposed skin. Extremities:  No cyanosis or clubbing.  HEENT:  Moist mucus membranes. No scleral icterus. No scleral injection. Cardiovascular:  Regular rate. No edema. Unable to appreciate JVD.  Pulmonary:  Good aeration & clear to auscultation bilaterally. Symmetric chest wall expansion. No accessory muscle use on room air. Abdomen: Soft. Normal bowel sounds. Protuberant. Grossly nontender. Musculoskeletal:  Normal bulk and tone. No joint deformity or effusion appreciated. Neurological:  Cranial nerves 2-12 grossly in tact. No meningismus. Moving all 4 extremities equally.   CPAP COMPLIANCE DOWNLOAD 09/10/16 - 12/08/16: 91% usage >/= 4 hours. Average usage on days used 7 hours 1 minutes. Set at CPAP pressures 16 cm H2O. Residual AHI 2.5 mg/hour. 95th percentile weeks per minute 0.5.  BiPAP Titration (06/20/16):  Previous polysomnogram January 2014 with AHI of 37 events/hour & desaturation to 78%. Optimal BiPAP 16 cm H2O & EPAP 12 cm H2O. CPAP at 14 cm H2O was not adequate and events persisted. CAI 0.9 events/hour. Severe desaturation to 79% was noted during testing. No cardiac abnormalities and only mild periodic limb movements.    Assessment & Plan:  66 y.o. female with severe OSA based on prior polysomnogram. I reviewed her BiPAP titration which indicated a requirement for 16/10 cm H2O pressure. Reviewing the patient's electronic medical record indicates Dr. Elsworth Soho had increased her CPAP to 16 cm H2O pressure. Her hypersomnolence has significantly improved on Provigil. I reviewed her compliance download from her home CPAP which shows excellent adherence and minimal residual AHI or leaks. I encouraged continuation of her CPAP usage. I instructed her to contact our office if she had any breathing problems or question before her next appointment.  1. Severe OSA:  Continuing CPAP at 16 cm H2O. Continuing indefinitely pending significant weight  loss. 2. Hypersomnolence:  Continue Provigil. 3. Health maintenance: Status post Pneumovax June 2014 & Tdap May 2017. Reports she recently received influenza and pneumonia vaccines. 4. Follow-up: Return to clinic in 3 months or sooner if needed.  Sonia Baller Ashok Cordia, M.D. [facility] Pulmonary [facility] Pager:  [phone] After 7pm or if no response, call [phone] 10:50 AM 12/09/16

## 2016-12-09 NOTE — Patient Instructions (Addendum)
   Continue using your CPAP and Provigil.  We will see you back in 3 months or sooner if needed.

## 2016-12-15 DIAGNOSIS — M1711 Unilateral primary osteoarthritis, right knee: Secondary | ICD-10-CM | POA: Diagnosis not present

## 2016-12-15 DIAGNOSIS — M25561 Pain in right knee: Secondary | ICD-10-CM | POA: Diagnosis not present

## 2016-12-15 DIAGNOSIS — M67911 Unspecified disorder of synovium and tendon, right shoulder: Secondary | ICD-10-CM | POA: Diagnosis not present

## 2016-12-23 DIAGNOSIS — M25561 Pain in right knee: Secondary | ICD-10-CM | POA: Diagnosis not present

## 2016-12-26 DIAGNOSIS — S83281A Other tear of lateral meniscus, current injury, right knee, initial encounter: Secondary | ICD-10-CM | POA: Diagnosis not present

## 2016-12-26 DIAGNOSIS — M1711 Unilateral primary osteoarthritis, right knee: Secondary | ICD-10-CM | POA: Diagnosis not present

## 2016-12-26 DIAGNOSIS — M25561 Pain in right knee: Secondary | ICD-10-CM | POA: Diagnosis not present

## 2017-01-13 DIAGNOSIS — L821 Other seborrheic keratosis: Secondary | ICD-10-CM | POA: Diagnosis not present

## 2017-01-13 DIAGNOSIS — C44729 Squamous cell carcinoma of skin of left lower limb, including hip: Secondary | ICD-10-CM | POA: Diagnosis not present

## 2017-01-13 DIAGNOSIS — Z85828 Personal history of other malignant neoplasm of skin: Secondary | ICD-10-CM | POA: Diagnosis not present

## 2017-01-13 DIAGNOSIS — D485 Neoplasm of uncertain behavior of skin: Secondary | ICD-10-CM | POA: Diagnosis not present

## 2017-01-13 DIAGNOSIS — L814 Other melanin hyperpigmentation: Secondary | ICD-10-CM | POA: Diagnosis not present

## 2017-01-13 DIAGNOSIS — D1801 Hemangioma of skin and subcutaneous tissue: Secondary | ICD-10-CM | POA: Diagnosis not present

## 2017-01-13 DIAGNOSIS — L570 Actinic keratosis: Secondary | ICD-10-CM | POA: Diagnosis not present

## 2017-02-12 DIAGNOSIS — E119 Type 2 diabetes mellitus without complications: Secondary | ICD-10-CM | POA: Diagnosis not present

## 2017-02-20 ENCOUNTER — Other Ambulatory Visit: Payer: Self-pay | Admitting: Pulmonary Disease

## 2017-02-23 ENCOUNTER — Telehealth: Payer: Self-pay

## 2017-02-23 NOTE — Telephone Encounter (Signed)
Received an Rx refill request for Provigil 100mg  Daily. Patient was given original rx on 07/28/2016. She saw Dr. Ashok Cordia on 12/09/16 where he wrote in AVS to continue provigil. Patient has appointment with Dr. Elsworth Soho on 04/07/17 for sleep consult.  Dr. Elsworth Soho please advise.

## 2017-02-25 NOTE — Telephone Encounter (Signed)
Okay to refill for 1 month until follow-up visit

## 2017-02-25 NOTE — Telephone Encounter (Signed)
RX was approved on 02/23/17 by TP for 30 tablets, no refills. It appears the RX was printed. Not sure if it ever reached the pharmacy.  Called [facility] in [place] to check on status of RX. Spoke with a tech who stated that they had not received the medication refill. Was transferred over to the pharmacist and was on hold for over 10 mins. Will call back tomorrow.

## 2017-02-27 NOTE — Telephone Encounter (Signed)
Left a message for [facility] for the refill.

## 2017-03-01 ENCOUNTER — Encounter: Payer: Self-pay | Admitting: Pulmonary Disease

## 2017-03-02 NOTE — Telephone Encounter (Signed)
Pt states that Modafinil 100mg  is going to be a non-covered med with her insurance.   Dr. Elsworth Soho, please advise if there is another sleep med pt can take in place of this.  Thanks!

## 2017-03-03 ENCOUNTER — Telehealth: Payer: Self-pay | Admitting: Pulmonary Disease

## 2017-03-03 NOTE — Telephone Encounter (Signed)
PA initiated through covermymeds for Modafinil. Key for PA is [id number]

## 2017-03-03 NOTE — Telephone Encounter (Signed)
It appears that PA for Modafinil 100mg  has been started today. Would you like to wait on determination of PA before checking with insurance about Nuvigil.    Ra please advise. Thanks.

## 2017-03-03 NOTE — Telephone Encounter (Signed)
Please check if Nuvigil is covered (Armodafinil)

## 2017-03-04 NOTE — Telephone Encounter (Signed)
Checked in CCM and the PA was never submitted  Looks like RA's name was misspelled and also the pt's phone number was missing  I corrected these and also answered the clinical questions and PA was resubmitted  Will forward to CP to f/u on, thanks

## 2017-03-05 NOTE — Telephone Encounter (Signed)
Med was approved on covermymeds through 09/01/17. Called [facility], spoke with Lenni letting her know med had been approved so they can fill it for pt.  Will close this encounter.

## 2017-03-06 NOTE — Telephone Encounter (Signed)
Pl check on nuvigil

## 2017-03-17 DIAGNOSIS — D485 Neoplasm of uncertain behavior of skin: Secondary | ICD-10-CM | POA: Diagnosis not present

## 2017-03-17 DIAGNOSIS — D0472 Carcinoma in situ of skin of left lower limb, including hip: Secondary | ICD-10-CM | POA: Diagnosis not present

## 2017-03-17 DIAGNOSIS — L439 Lichen planus, unspecified: Secondary | ICD-10-CM | POA: Diagnosis not present

## 2017-04-02 DIAGNOSIS — I10 Essential (primary) hypertension: Secondary | ICD-10-CM | POA: Diagnosis not present

## 2017-04-02 DIAGNOSIS — J329 Chronic sinusitis, unspecified: Secondary | ICD-10-CM | POA: Diagnosis not present

## 2017-04-07 ENCOUNTER — Ambulatory Visit (INDEPENDENT_AMBULATORY_CARE_PROVIDER_SITE_OTHER): Payer: Medicare Other | Admitting: Pulmonary Disease

## 2017-04-07 ENCOUNTER — Encounter: Payer: Self-pay | Admitting: Pulmonary Disease

## 2017-04-07 DIAGNOSIS — G4733 Obstructive sleep apnea (adult) (pediatric): Secondary | ICD-10-CM

## 2017-04-07 DIAGNOSIS — G4710 Hypersomnia, unspecified: Secondary | ICD-10-CM

## 2017-04-07 NOTE — Progress Notes (Signed)
   Subjective:    Patient ID: [name] [name], female    DOB: 1950/06/20, 67 y.o.   MRN: [id number]  HPI  67  yo Diabetic, hypertensive for follow-up of severe obstructive sleep apnea  She continued to have excessive daytime somnolence, on further history seems to date back to at least 10-20 years.  No history of head injury. There is no history suggestive of cataplexy, sleep paralysis or parasomnias  She is compliant with her CPAP machine, no problems with mask or pressure.  CPAP was adjusted to average 16 cm and this seems to be working well.  No dryness.  Review of medication shows Coreg 25 twice daily but she does state that somnolence predates this medication   Significant tests/ events  NPSG 02/2012: AHI 37/hr, desat to 78% Auto 2014: Optimal pressure 16cm. 12/2012: Decrease cpap to 14cm (10 pound weight loss).  05/2016 >>PAP titration  16/12 cm H2O    Past Medical History:  Diagnosis Date  . Arthritis    "hands, neck, right knee" (11/29/2013)  . Biliary dyskinesia 07/2006  . Cancer, uterine ([place]) 05/2003   F1G01,G1  . Depression   . Diverticulosis    Hx: of  . Gastroparesis 7/08   Dr. Collene Mares  . GERD (gastroesophageal reflux disease)   . HTN (hypertension) 2005  . Hyperlipidemia   . OSA on CPAP   . Pneumonia 1990's X 1  . Type II diabetes mellitus ([place]) 2005  . Urinary, incontinence, stress female 12/13   using Pessary 01/2012    Review of Systems Patient denies significant dyspnea,cough, hemoptysis,  chest pain, palpitations, pedal edema, orthopnea, paroxysmal nocturnal dyspnea, lightheadedness, nausea, vomiting, abdominal or  leg pains      Objective:   Physical Exam  Gen. Pleasant, obese, in no distress ENT - no lesions, no post nasal drip Neck: No JVD, no thyromegaly, no carotid bruits Lungs: no use of accessory muscles, no dullness to percussion, decreased without rales or rhonchi  Cardiovascular: Rhythm regular, heart sounds  normal, no murmurs  or gallops, no peripheral edema Musculoskeletal: No deformities, no cyanosis or clubbing , no tremors       Assessment & Plan:

## 2017-04-07 NOTE — Assessment & Plan Note (Signed)
No red flags for narcolepsy, M SLT would be low yield in this age group  Continue to take Provigil once daily especially if you are having a busy day   discuss with cardiologist about decreasing dose of Coreg

## 2017-04-07 NOTE — Assessment & Plan Note (Signed)
We will check download report on your CPAP machine and confirm settings are okay.  Weight loss encouraged, compliance with goal of at least 4-6 hrs every night is the expectation. Advised against medications with sedative side effects Cautioned against driving when sleepy - understanding that sleepiness will vary on a day to day basis

## 2017-04-07 NOTE — Patient Instructions (Signed)
  We will check download report on your CPAP machine and confirm settings are okay.  Continue to take Provigil once daily especially if you are having a busy day   discuss with cardiologist about decreasing dose of Coreg

## 2017-04-17 DIAGNOSIS — R0989 Other specified symptoms and signs involving the circulatory and respiratory systems: Secondary | ICD-10-CM | POA: Diagnosis not present

## 2017-04-17 DIAGNOSIS — R002 Palpitations: Secondary | ICD-10-CM | POA: Diagnosis not present

## 2017-04-17 DIAGNOSIS — I2510 Atherosclerotic heart disease of native coronary artery without angina pectoris: Secondary | ICD-10-CM | POA: Diagnosis not present

## 2017-04-17 DIAGNOSIS — I10 Essential (primary) hypertension: Secondary | ICD-10-CM | POA: Diagnosis not present

## 2017-04-22 ENCOUNTER — Encounter (INDEPENDENT_AMBULATORY_CARE_PROVIDER_SITE_OTHER): Payer: Self-pay

## 2017-04-22 ENCOUNTER — Other Ambulatory Visit: Payer: Self-pay | Admitting: Pulmonary Disease

## 2017-04-22 NOTE — Telephone Encounter (Signed)
Patient is requesting a refill on her modafinil 100mg . She was last seen by you on 04/07/17. Last refill was on 02/23/17 for 30 tablets, no refills.   Please advise if this refill is appropriate. Thanks!

## 2017-04-22 NOTE — Telephone Encounter (Signed)
Okay to provide 2 refills

## 2017-04-29 ENCOUNTER — Encounter (INDEPENDENT_AMBULATORY_CARE_PROVIDER_SITE_OTHER): Payer: Self-pay | Admitting: Family Medicine

## 2017-04-29 ENCOUNTER — Ambulatory Visit (INDEPENDENT_AMBULATORY_CARE_PROVIDER_SITE_OTHER): Payer: Medicare Other | Admitting: Family Medicine

## 2017-04-29 VITALS — BP 146/76 | HR 55 | Temp 98.0°F | Ht 63.0 in | Wt 248.0 lb

## 2017-04-29 DIAGNOSIS — E11649 Type 2 diabetes mellitus with hypoglycemia without coma: Secondary | ICD-10-CM

## 2017-04-29 DIAGNOSIS — E559 Vitamin D deficiency, unspecified: Secondary | ICD-10-CM | POA: Diagnosis not present

## 2017-04-29 DIAGNOSIS — I10 Essential (primary) hypertension: Secondary | ICD-10-CM | POA: Diagnosis not present

## 2017-04-29 DIAGNOSIS — Z6841 Body Mass Index (BMI) 40.0 and over, adult: Secondary | ICD-10-CM | POA: Diagnosis not present

## 2017-04-29 DIAGNOSIS — R0602 Shortness of breath: Secondary | ICD-10-CM | POA: Diagnosis not present

## 2017-04-29 DIAGNOSIS — R5383 Other fatigue: Secondary | ICD-10-CM

## 2017-04-29 DIAGNOSIS — Z1331 Encounter for screening for depression: Secondary | ICD-10-CM

## 2017-04-29 DIAGNOSIS — Z0289 Encounter for other administrative examinations: Secondary | ICD-10-CM

## 2017-04-29 NOTE — Progress Notes (Signed)
.  Office: [phone]  /  Fax: [phone]   HPI:   Chief Complaint: [name] [name] (MR# [id number]) is a 67 y.o. female who presents on 04/29/2017 for obesity evaluation and treatment. Current BMI is Body mass index is 43.93 kg/m.[name] [name] has struggled with obesity for years and has been unsuccessful in either losing weight or maintaining long term weight loss. [name] [name] attended our information session and states she is currently in the action stage of change and ready to dedicate time achieving and maintaining a healthier weight.  [name] [name] states her family eats meals together she thinks her family will eat healthier with  her her desired weight loss is 28 lbs she started gaining weight in her 40's her heaviest weight ever was 267 lbs. she snacks frequently in the evenings she skips meals frequently she is frequently drinking liquids with calories she frequently makes poor food choices she frequently eats larger portions than normal  she has binge eating behaviors   Fatigue [name] [name] feels her energy is lower than it should be. This has worsened with weight gain and has not worsened recently. [name] [name] admits to daytime somnolence and denies waking up still tired. Patient is at risk for obstructive sleep apnea. Patent has a history of symptoms of daytime fatigue and hypertension. Patient generally gets 6 hours of sleep per night, and states they generally have restful sleep. Snoring is present. Apneic episodes are present. Epworth Sleepiness Score is 17  Dyspnea on exertion [name] [name] notes increasing shortness of breath with exercising and seems to be worsening over time with weight gain. She notes getting out of breath sooner with activity than she used to. This has not gotten worse recently. [name] [name] denies orthopnea.  Vitamin D deficiency [name] [name] has a diagnosis of vitamin D deficiency. She is on vit D OTC in multi vitamin. Her last level was at goal and she denies  nausea, vomiting or muscle weakness.   Ref. Range 08/19/2016 16:31  Vitamin D, 25-Hydroxy Latest Ref Range: 30.0 - 100.0 ng/mL 51.2   Hypertension [name] [name] is a 67 y.o. female with hypertension. Her blood pressure is elevated today and her medications were adjusted 2 weeks ago, which may contribute. [name] [name] denies chest pain. She is working weight loss to help control her blood pressure with the goal of decreasing her risk of heart attack and stroke. [name] [name] blood pressure is not currently controlled.  Diabetes II [name] [name] has a diagnosis of diabetes type II. She is on glimepiride, metformin and tradjenta currently.  [name] [name] states fasting BGs range between 104 and 154, 2 hour post prandial range between 62 and 108 and she notes some hypoglycemia on glimepiride. She has been working on intensive lifestyle modifications including diet, exercise, and weight loss to help control her blood glucose levels.  Depression Screen [name] [name]'s Food and Mood (modified PHQ-9) score was  Depression screen PHQ 2/9 04/29/2017  Decreased Interest 3  Down, Depressed, Hopeless 1  PHQ - 2 Score 4  Altered sleeping 3  Tired, decreased energy 3  Change in appetite 2  Feeling bad or failure about yourself  1  Trouble concentrating 3  Moving slowly or fidgety/restless 2  Suicidal thoughts 1  PHQ-9 Score 19    ALLERGIES: Allergies  Allergen Reactions  . Codeine Palpitations    MEDICATIONS: Current Outpatient Medications on File Prior to Visit  Medication Sig Dispense Refill  . amLODipine (NORVASC) 10 MG tablet Take 1 tablet (10 mg total) by mouth daily. [name] [name]  tablet 2  . atorvastatin (LIPITOR) 80 MG tablet Take 1 tablet by mouth daily.  5  . benazepril (LOTENSIN) 40 MG tablet Take 40 mg by mouth daily.    . bisoprolol (ZEBETA) 10 MG tablet Take 10 mg by mouth daily.    . cetirizine (ZYRTEC) 10 MG tablet Take 10 mg by mouth daily.    . chlorthalidone (HYGROTON) 25 MG tablet 1 table once  daily  3  . escitalopram (LEXAPRO) 20 MG tablet Take 20 mg by mouth daily.     Marland Kitchen glimepiride (AMARYL) 4 MG tablet Take 4 mg by mouth daily with breakfast.    . linagliptin (TRADJENTA) 5 MG TABS tablet Take by mouth.    . meloxicam (MOBIC) 15 MG tablet Take 15 mg by mouth daily.    . metFORMIN (GLUCOPHAGE) 1000 MG tablet Take 1 tablet (1,000 mg total) by mouth 2 (two) times daily with a meal.    . modafinil (PROVIGIL) 100 MG tablet TAKE 1 TABLET BY MOUTH EVERY DAY 30 tablet 2  . Multiple Vitamins-Minerals (SENTRY SENIOR PO) Take 1 tablet by mouth daily.    . Omega-3 Fatty Acids (FISH OIL) 1000 MG CAPS Take 1 capsule by mouth daily.    Marland Kitchen omeprazole (PRILOSEC) 20 MG capsule Take 20 mg by mouth 2 (two) times daily before a meal.      No current facility-administered medications on file prior to visit.     PAST MEDICAL HISTORY: Past Medical History:  Diagnosis Date  . Arthritis    "hands, neck, right knee" (11/29/2013)  . Biliary dyskinesia 07/2006  . Cancer, uterine ([place]) 05/2003   F1G01,G1  . Depression   . Diverticulosis    Hx: of  . Gastroparesis 7/08   Dr. Collene Mares  . GERD (gastroesophageal reflux disease)   . HTN (hypertension) 2005  . Hyperlipidemia   . OSA on CPAP   . Pneumonia 1990's X 1  . Type II diabetes mellitus ([place]) 2005  . Urinary, incontinence, stress female 12/13   using [place] 01/2012    PAST SURGICAL HISTORY: Past Surgical History:  Procedure Laterality Date  . CARDIAC CATHETERIZATION  11/29/2013  . COLONOSCOPY W/ BIOPSIES  10/04   polyp  . COLONOSCOPY W/ BIOPSIES  02/12/05   benign polyp  . COLONOSCOPY W/ BIOPSIES  10/26/09   1 polyp benign, recheck 5 years  . COLONOSCOPY WITH ESOPHAGOGASTRODUODENOSCOPY (EGD)  09/14/06   2 polyps, diverticula, diffuse gastritis  . JOINT REPLACEMENT    . KNEE ARTHROSCOPY Left 2010  . KNEE ARTHROSCOPY Right 11/2012  . LAPAROSCOPIC CHOLECYSTECTOMY  10/2006  . LEFT HEART CATHETERIZATION WITH CORONARY ANGIOGRAM N/A 11/29/2013    Procedure: LEFT HEART CATHETERIZATION WITH CORONARY ANGIOGRAM;  Surgeon: Laverda Page, MD;  Location: [facility] CATH LAB;  Service: Cardiovascular;  Laterality: N/A;  . TOTAL ABDOMINAL HYSTERECTOMY  05/2003   w/BSO; endometrial carcinoma F1G01, G1  . TOTAL HIP ARTHROPLASTY Right 09/02/2013   Procedure: RIGHT TOTAL HIP ARTHROPLASTY ANTERIOR APPROACH;  Surgeon: Alta Corning, MD;  Location: [place];  Service: Orthopedics;  Laterality: Right;  . TOTAL KNEE ARTHROPLASTY Left 08/02/2012   Procedure: TOTAL KNEE ARTHROPLASTY;  Surgeon: Alta Corning, MD;  Location: [place];  Service: Orthopedics;  Laterality: Left;  left total knee arthroplasty  . TUBAL LIGATION      SOCIAL HISTORY: Social History   Tobacco Use  . Smoking status: Never Smoker  . Smokeless tobacco: Never Used  Substance Use Topics  . Alcohol use: No  .  Drug use: No    FAMILY HISTORY: Family History  Problem Relation Age of Onset  . Heart disease Father 95       CABG  . Lung cancer Father 34       lung  . Hypertension Father   . Diabetes Father   . High Cholesterol Father   . Diabetes type II Father   . Heart disease Sister 6       CABG  . Diabetes Sister 53  . Heart disease Brother 80       MI with stents  . Hypertension Mother   . Diabetes Mother   . High Cholesterol Mother   . Sudden death Mother   . Thyroid disease Mother   . Obesity Mother   . Hypertension Sister        x2   . Diabetes Sister   . Hypertension Brother   . Diabetes Brother   . Cancer Sister        high grade Neuroendocrine carcinoma right axilla  . Pancreatic cancer Other        x 3 - 2 MAunts and 1 P Aunts.   . Lupus Other        Pat Aunt    ROS: Review of Systems  Constitutional: Positive for malaise/fatigue.  Respiratory: Positive for shortness of breath (on exertion).   Cardiovascular: Negative for chest pain and orthopnea.  Gastrointestinal: Positive for heartburn. Negative for nausea and vomiting.  Musculoskeletal:       Negative  for muscle weakness  Endo/Heme/Allergies:       Positive for hypoglycemia  Psychiatric/Behavioral: Positive for depression.    PHYSICAL EXAM: Blood pressure (!) 146/76, pulse (!) 55, temperature 98 F (36.7 C), temperature source Oral, height 5\' 3"  (1.6 m), weight 248 lb (112.5 kg), last menstrual period 11/25/2002, SpO2 93 %. Body mass index is 43.93 kg/m. Physical Exam  Constitutional: She is oriented to person, place, and time. She appears well-developed and well-nourished.  HENT:  Head: Normocephalic and atraumatic.  Nose: Nose normal.  Eyes: EOM are normal. No scleral icterus.  Neck: Normal range of motion. Neck supple. No thyromegaly present.  Cardiovascular: Regular rhythm. Bradycardia present.  Pulmonary/Chest: Effort normal. No respiratory distress.  Abdominal: Soft. There is no tenderness.  + obesity   Musculoskeletal: Normal range of motion.  Range of Motion normal in all 4 extremities  Neurological: She is alert and oriented to person, place, and time. Coordination normal.  Skin: Skin is warm and dry.  Psychiatric: She has a normal mood and affect. Her behavior is normal.  Vitals reviewed.   RECENT LABS AND TESTS: BMET    Component Value Date/Time   NA 139 07/16/2015 1339   K 4.3 07/16/2015 1339   CL 101 07/16/2015 1339   CO2 27 07/16/2015 1339   GLUCOSE 108 (H) 07/16/2015 1339   BUN 35 (H) 07/16/2015 1339   CREATININE 1.24 (H) 07/16/2015 1339   CALCIUM 9.3 07/16/2015 1339   GFRNONAA >90 11/30/2013 0607   GFRAA >90 11/30/2013 7341   Lab Results  Component Value Date   HGBA1C 7.0 (H) 07/16/2015   No results found for: INSULIN CBC    Component Value Date/Time   WBC 5.9 07/16/2015 1339   RBC 3.76 (L) 07/16/2015 1339   HGB 10.8 (L) 07/16/2015 1339   HGB 11.2 (L) 07/11/2014 1340   HCT 33.9 (L) 07/16/2015 1339   PLT 267 07/16/2015 1339   MCV 90.2 07/16/2015 1339   MCH 28.7  07/16/2015 1339   MCHC 31.9 (L) 07/16/2015 1339   RDW 13.8 07/16/2015 1339     LYMPHSABS 1,829 07/16/2015 1339   MONOABS 708 07/16/2015 1339   EOSABS 177 07/16/2015 1339   BASOSABS 59 07/16/2015 1339   Iron/TIBC/Ferritin/ %Sat No results found for: IRON, TIBC, FERRITIN, IRONPCTSAT Lipid Panel     Component Value Date/Time   CHOL 206 (H) 07/16/2015 1339   TRIG 444 (H) 07/16/2015 1339   HDL 35 (L) 07/16/2015 1339   CHOLHDL 5.9 (H) 07/16/2015 1339   VLDL NOT CALC 07/16/2015 1339   LDLCALC NOT CALC 07/16/2015 1339   Hepatic Function Panel     Component Value Date/Time   PROT 6.8 07/16/2015 1339   ALBUMIN 4.0 07/16/2015 1339   AST 19 07/16/2015 1339   ALT 26 07/16/2015 1339   ALKPHOS 32 (L) 07/16/2015 1339   BILITOT 0.4 07/16/2015 1339      Component Value Date/Time   TSH 3.96 07/16/2015 1339   Vitamin D  Ref. Range 08/19/2016 16:31  Vitamin D, 25-Hydroxy Latest Ref Range: 30.0 - 100.0 ng/mL 51.2    ECG  shows NSR with a rate of 54 BPM INDIRECT CALORIMETER done today shows a VO2 of 282 and a REE of 1962. Her calculated basal metabolic rate is 5329 thus her basal metabolic rate is better than expected.    ASSESSMENT AND PLAN: Other fatigue - Plan: EKG 12-Lead, CBC With Differential, Lipid Panel With LDL/HDL Ratio, T3, T4, free, TSH  Shortness of breath on exertion - Plan: CBC With Differential  Type 2 diabetes mellitus with hypoglycemia without coma, without long-term current use of insulin (HCC) - Plan: Comprehensive metabolic panel, Hemoglobin A1c, Insulin, random  Vitamin D deficiency - Plan: VITAMIN D 25 Hydroxy (Vit-D Deficiency, Fractures)  Essential hypertension  Depression screening  Class 3 severe obesity with serious comorbidity and body mass index (BMI) of 40.0 to 44.9 in adult, unspecified obesity type (HCC)  PLAN:  Fatigue [name] [name] was informed that her fatigue may be related to obesity, depression or many other causes. Labs will be ordered, and in the meanwhile [name] [name] has agreed to work on diet, exercise and weight loss to  help with fatigue. Proper sleep hygiene was discussed including the need for 7-8 hours of quality sleep each night. A sleep study was not ordered based on symptoms and Epworth score.  Dyspnea on exertion [name] [name]'s shortness of breath appears to be obesity related and exercise induced. She has agreed to work on weight loss and gradually increase exercise to treat her exercise induced shortness of breath. If [name] [name] follows our instructions and loses weight without improvement of her shortness of breath, we will plan to refer to pulmonology. We will monitor this condition regularly. [name] [name] agrees to this plan.  Vitamin D Deficiency [name] [name] was informed that low vitamin D levels contributes to fatigue and are associated with obesity, breast, and colon cancer. She will continue vitamin D in multi vitamin and will follow up for routine testing of vitamin D, at least 2-3 times per year. She was informed of the risk of over-replacement of vitamin D and agrees to not increase her dose unless she discusses this with [name] first. We will check labs and [name] [name] will follow up as directed.  Hypertension We discussed sodium restriction, working on healthy weight loss, and a regular exercise program as the means to achieve improved blood pressure control. [name] [name] agreed with this plan and agreed to follow up as directed. We will recheck blood pressure  in 2 weeks and will continue to monitor her blood pressure as well as her progress with the above lifestyle modifications. We will check labs and she will continue her medications as prescribed and will watch for signs of hypotension as she continues her lifestyle modifications.   Diabetes II [name] [name] has been given extensive diabetes education by myself today including ideal fasting and post-prandial blood glucose readings, individual ideal Hgb A1c goals and hypoglycemia prevention. We discussed the importance of good blood sugar control to decrease the likelihood  of diabetic complications such as nephropathy, neuropathy, limb loss, blindness, coronary artery disease, and death. We discussed the importance of intensive lifestyle modification including diet, exercise and weight loss as the first line treatment for diabetes. [name] [name] agrees to continue her diabetes medications and will follow up at the agreed upon time. We will check labs and [name] [name] agrees to work on diet and exercise.  Depression Screen [name] [name] had a strongly positive depression screening. Depression is commonly associated with obesity and often results in emotional eating behaviors. We will monitor this closely and work on CBT to help improve the non-hunger eating patterns. Referral to Psychology may be required if no improvement is seen as she continues in our clinic.  Obesity [name] [name] is currently in the action stage of change and her goal is to continue with weight loss efforts She has agreed to follow the Category 2 plan +200 calories [name] [name] has been instructed to work up to a goal of 150 minutes of combined cardio and strengthening exercise per week for weight loss and overall health benefits. We discussed the following Behavioral Modification Strategies today: increasing lean protein intake, decreasing simple carbohydrates  and work on meal planning and easy cooking plans  Emiliya has agreed to follow up with our clinic in 2 weeks. She was informed of the importance of frequent follow up visits to maximize her success with intensive lifestyle modifications for her multiple health conditions. She was informed we would discuss her lab results at her next visit unless there is a critical issue that needs to be addressed sooner. Eda agreed to keep her next visit at the agreed upon time to discuss these results.    OBESITY BEHAVIORAL INTERVENTION VISIT  Today's visit was # 1 out of 22.  Starting weight: 248 lbs Starting date: 04/29/17 Today's weight : 248 lbs Today's date:  04/29/2017 Total lbs lost to date: 0 (Patients must lose 7 lbs in the first 6 months to continue with counseling)   ASK: We discussed the diagnosis of obesity with [name] today and Breyah agreed to give [name] permission to discuss obesity behavioral modification therapy today.  ASSESS: Nazariah has the diagnosis of obesity and her BMI today is 43.94 Asucena is in the action stage of change   ADVISE: Sharnelle was educated on the multiple health risks of obesity as well as the benefit of weight loss to improve her health. She was advised of the need for long term treatment and the importance of lifestyle modifications.  AGREE: Multiple dietary modification options and treatment options were discussed and  Vaeda agreed to the above obesity treatment plan.   I, Doreene Nest, am acting as transcriptionist for Dennard Nip, MD     I have reviewed the above documentation for accuracy and completeness, and I agree with the above. -Dennard Nip, MD

## 2017-04-30 LAB — CBC WITH DIFFERENTIAL
BASOS ABS: 0 10*3/uL (ref 0.0–0.2)
Basos: 1 %
EOS (ABSOLUTE): 0.2 10*3/uL (ref 0.0–0.4)
Eos: 3 %
HEMATOCRIT: 39.4 % (ref 34.0–46.6)
Hemoglobin: 13.1 g/dL (ref 11.1–15.9)
Immature Grans (Abs): 0 10*3/uL (ref 0.0–0.1)
Immature Granulocytes: 0 %
LYMPHS ABS: 2.1 10*3/uL (ref 0.7–3.1)
Lymphs: 28 %
MCH: 29.4 pg (ref 26.6–33.0)
MCHC: 33.2 g/dL (ref 31.5–35.7)
MCV: 88 fL (ref 79–97)
MONOS ABS: 0.8 10*3/uL (ref 0.1–0.9)
Monocytes: 10 %
NEUTROS ABS: 4.4 10*3/uL (ref 1.4–7.0)
Neutrophils: 58 %
RBC: 4.46 x10E6/uL (ref 3.77–5.28)
RDW: 14.2 % (ref 12.3–15.4)
WBC: 7.5 10*3/uL (ref 3.4–10.8)

## 2017-04-30 LAB — TSH: TSH: 3.01 u[IU]/mL (ref 0.450–4.500)

## 2017-04-30 LAB — COMPREHENSIVE METABOLIC PANEL
ALK PHOS: 69 IU/L (ref 39–117)
ALT: 30 IU/L (ref 0–32)
AST: 19 IU/L (ref 0–40)
Albumin/Globulin Ratio: 1.5 (ref 1.2–2.2)
Albumin: 4.4 g/dL (ref 3.6–4.8)
BILIRUBIN TOTAL: 0.3 mg/dL (ref 0.0–1.2)
BUN/Creatinine Ratio: 32 — ABNORMAL HIGH (ref 12–28)
BUN: 28 mg/dL — ABNORMAL HIGH (ref 8–27)
CHLORIDE: 97 mmol/L (ref 96–106)
CO2: 24 mmol/L (ref 20–29)
CREATININE: 0.88 mg/dL (ref 0.57–1.00)
Calcium: 9.8 mg/dL (ref 8.7–10.3)
GFR calc Af Amer: 80 mL/min/{1.73_m2} (ref >59)
GFR calc non Af Amer: 69 mL/min/{1.73_m2} (ref >59)
GLOBULIN, TOTAL: 2.9 g/dL (ref 1.5–4.5)
Glucose: 89 mg/dL (ref 65–99)
POTASSIUM: 4.5 mmol/L (ref 3.5–5.2)
SODIUM: 138 mmol/L (ref 134–144)
Total Protein: 7.3 g/dL (ref 6.0–8.5)

## 2017-04-30 LAB — VITAMIN D 25 HYDROXY (VIT D DEFICIENCY, FRACTURES): VIT D 25 HYDROXY: 29.7 ng/mL — AB (ref 30.0–100.0)

## 2017-04-30 LAB — LIPID PANEL WITH LDL/HDL RATIO
Cholesterol, Total: 223 mg/dL — ABNORMAL HIGH (ref 100–199)
HDL: 36 mg/dL — ABNORMAL LOW (ref >39)
Triglycerides: 420 mg/dL — ABNORMAL HIGH (ref 0–149)

## 2017-04-30 LAB — INSULIN, RANDOM: INSULIN: 36 u[IU]/mL — ABNORMAL HIGH (ref 2.6–24.9)

## 2017-04-30 LAB — T3: T3, Total: 111 ng/dL (ref 71–180)

## 2017-04-30 LAB — HEMOGLOBIN A1C
ESTIMATED AVERAGE GLUCOSE: 151 mg/dL
HEMOGLOBIN A1C: 6.9 % — AB (ref 4.8–5.6)

## 2017-04-30 LAB — T4, FREE: Free T4: 0.95 ng/dL (ref 0.82–1.77)

## 2017-05-13 DIAGNOSIS — Z0000 Encounter for general adult medical examination without abnormal findings: Secondary | ICD-10-CM | POA: Diagnosis not present

## 2017-05-13 DIAGNOSIS — K760 Fatty (change of) liver, not elsewhere classified: Secondary | ICD-10-CM | POA: Diagnosis not present

## 2017-05-13 DIAGNOSIS — Z6841 Body Mass Index (BMI) 40.0 and over, adult: Secondary | ICD-10-CM | POA: Diagnosis not present

## 2017-05-13 DIAGNOSIS — E782 Mixed hyperlipidemia: Secondary | ICD-10-CM | POA: Diagnosis not present

## 2017-05-13 DIAGNOSIS — I10 Essential (primary) hypertension: Secondary | ICD-10-CM | POA: Diagnosis not present

## 2017-05-13 DIAGNOSIS — E119 Type 2 diabetes mellitus without complications: Secondary | ICD-10-CM | POA: Diagnosis not present

## 2017-05-14 ENCOUNTER — Ambulatory Visit (INDEPENDENT_AMBULATORY_CARE_PROVIDER_SITE_OTHER): Payer: Medicare Other | Admitting: Family Medicine

## 2017-05-14 VITALS — BP 145/69 | HR 56 | Temp 98.2°F | Ht 63.0 in | Wt 244.0 lb

## 2017-05-14 DIAGNOSIS — E559 Vitamin D deficiency, unspecified: Secondary | ICD-10-CM | POA: Diagnosis not present

## 2017-05-14 DIAGNOSIS — E860 Dehydration: Secondary | ICD-10-CM | POA: Diagnosis not present

## 2017-05-14 DIAGNOSIS — Z6841 Body Mass Index (BMI) 40.0 and over, adult: Secondary | ICD-10-CM | POA: Diagnosis not present

## 2017-05-14 DIAGNOSIS — I10 Essential (primary) hypertension: Secondary | ICD-10-CM

## 2017-05-14 DIAGNOSIS — E782 Mixed hyperlipidemia: Secondary | ICD-10-CM

## 2017-05-14 MED ORDER — VITAMIN D (ERGOCALCIFEROL) 1.25 MG (50000 UNIT) PO CAPS: 50000.0000 [IU] | ORAL_CAPSULE | ORAL | 0 refills | Status: DC

## 2017-05-18 NOTE — Progress Notes (Signed)
Office: [phone]  /  Fax: [phone]   HPI:   Chief Complaint: OBESITY [name] [name] is here to discuss her progress with her obesity treatment plan. She is on the Category 2 plan +200 calories and is following her eating plan approximately 80 to 85 % of the time. She states she is exercising 0 minutes 0 times per week. [name] [name] has done well with weight loss on the category 2 plan. Her hunger has been controlled. She did eat out with family, but she made good choices. Her weight is 244 lb (110.7 kg) today and has had a weight loss of 4 pounds over a period of 2 weeks since her last visit. She has lost 4 lbs since starting treatment with [name].  Vitamin D deficiency (new) [name] [name] has a new diagnosis of vitamin D deficiency. She is not currently taking vit D. [name] [name] admits to fatigue and denies nausea, vomiting or muscle weakness.  Dehydration [name] [name] has a diagnosis of dehydration. Her BUN is elevated and she hasn't been drinking enough water. She denies lightheadedness or dizziness.  Mixed Hyperlipidemia [name] [name] has mixed hyperlipidemia with elevated triglycerides and low HDL. LDL was unable to be calculated. [name] [name] is not on tricor and she would like to control her cholesterol levels with intensive lifestyle modification including a low saturated fat diet, exercise and weight loss. She denies any chest pain, claudication or myalgias.  Hypertension [name] [name] is a 67 y.o. female with hypertension. Her blood pressure is elevated today on norvasc and lotensin, but she is also on provigil. [name] [name] denies chest pain or headache. She is working weight loss to help control her blood pressure with the goal of decreasing her risk of heart attack and stroke. [name] [name] blood pressure is not currently controlled.  ALLERGIES: Allergies  Allergen Reactions  . Codeine Palpitations    MEDICATIONS: Current Outpatient Medications on File Prior to Visit  Medication Sig Dispense  Refill  . amLODipine (NORVASC) 10 MG tablet Take 1 tablet (10 mg total) by mouth daily. 30 tablet 2  . atorvastatin (LIPITOR) 80 MG tablet Take 1 tablet by mouth daily.  5  . benazepril (LOTENSIN) 40 MG tablet Take 40 mg by mouth daily.    . bisoprolol (ZEBETA) 10 MG tablet Take 10 mg by mouth daily.    . cetirizine (ZYRTEC) 10 MG tablet Take 10 mg by mouth daily.    . chlorthalidone (HYGROTON) 25 MG tablet 1 table once daily  3  . escitalopram (LEXAPRO) 20 MG tablet Take 20 mg by mouth daily.     Marland Kitchen linagliptin (TRADJENTA) 5 MG TABS tablet Take by mouth.    . meloxicam (MOBIC) 15 MG tablet Take 15 mg by mouth daily.    . metFORMIN (GLUCOPHAGE) 1000 MG tablet Take 1 tablet (1,000 mg total) by mouth 2 (two) times daily with a meal.    . modafinil (PROVIGIL) 100 MG tablet TAKE 1 TABLET BY MOUTH EVERY DAY 30 tablet 2  . Multiple Vitamins-Minerals (SENTRY SENIOR PO) Take 1 tablet by mouth daily.    . Omega-3 Fatty Acids (FISH OIL) 1000 MG CAPS Take 1 capsule by mouth daily.    Marland Kitchen omeprazole (PRILOSEC) 20 MG capsule Take 20 mg by mouth 2 (two) times daily before a meal.      No current facility-administered medications on file prior to visit.     PAST MEDICAL HISTORY: Past Medical History:  Diagnosis Date  . Arthritis    "hands, neck, right knee" (11/29/2013)  .  Biliary dyskinesia 07/2006  . Cancer, uterine ([place]) 05/2003   F1G01,G1  . Depression   . Diverticulosis    Hx: of  . Gastroparesis 7/08   Dr. Collene Mares  . GERD (gastroesophageal reflux disease)   . HTN (hypertension) 2005  . Hyperlipidemia   . OSA on CPAP   . Pneumonia 1990's X 1  . Type II diabetes mellitus ([place]) 2005  . Urinary, incontinence, stress female 12/13   using Pessary 01/2012    PAST SURGICAL HISTORY: Past Surgical History:  Procedure Laterality Date  . CARDIAC CATHETERIZATION  11/29/2013  . COLONOSCOPY W/ BIOPSIES  10/04   polyp  . COLONOSCOPY W/ BIOPSIES  02/12/05   benign polyp  . COLONOSCOPY W/ BIOPSIES   10/26/09   1 polyp benign, recheck 5 years  . COLONOSCOPY WITH ESOPHAGOGASTRODUODENOSCOPY (EGD)  09/14/06   2 polyps, diverticula, diffuse gastritis  . JOINT REPLACEMENT    . KNEE ARTHROSCOPY Left 2010  . KNEE ARTHROSCOPY Right 11/2012  . LAPAROSCOPIC CHOLECYSTECTOMY  10/2006  . LEFT HEART CATHETERIZATION WITH CORONARY ANGIOGRAM N/A 11/29/2013   Procedure: LEFT HEART CATHETERIZATION WITH CORONARY ANGIOGRAM;  Surgeon: Laverda Page, MD;  Location: [facility] CATH LAB;  Service: Cardiovascular;  Laterality: N/A;  . TOTAL ABDOMINAL HYSTERECTOMY  05/2003   w/BSO; endometrial carcinoma F1G01, G1  . TOTAL HIP ARTHROPLASTY Right 09/02/2013   Procedure: RIGHT TOTAL HIP ARTHROPLASTY ANTERIOR APPROACH;  Surgeon: Alta Corning, MD;  Location: [place];  Service: Orthopedics;  Laterality: Right;  . TOTAL KNEE ARTHROPLASTY Left 08/02/2012   Procedure: TOTAL KNEE ARTHROPLASTY;  Surgeon: Alta Corning, MD;  Location: [place];  Service: Orthopedics;  Laterality: Left;  left total knee arthroplasty  . TUBAL LIGATION      SOCIAL HISTORY: Social History   Tobacco Use  . Smoking status: Never Smoker  . Smokeless tobacco: Never Used  Substance Use Topics  . Alcohol use: No  . Drug use: No    FAMILY HISTORY: Family History  Problem Relation Age of Onset  . Heart disease Father 4       CABG  . Lung cancer Father 64       lung  . Hypertension Father   . Diabetes Father   . High Cholesterol Father   . Diabetes type II Father   . Heart disease Sister 31       CABG  . Diabetes Sister 43  . Heart disease Brother 53       MI with stents  . Hypertension Mother   . Diabetes Mother   . High Cholesterol Mother   . Sudden death Mother   . Thyroid disease Mother   . Obesity Mother   . Hypertension Sister        x2   . Diabetes Sister   . Hypertension Brother   . Diabetes Brother   . Cancer Sister        high grade Neuroendocrine carcinoma right axilla  . Pancreatic cancer Other        x 3 - 2 MAunts and 1 P  Aunts.   . Lupus Other        Pat Aunt    ROS: Review of Systems  Constitutional: Positive for malaise/fatigue and weight loss.  Cardiovascular: Negative for chest pain and claudication.  Gastrointestinal: Negative for nausea and vomiting.  Musculoskeletal: Negative for myalgias.       Negative for muscle weakness  Neurological: Negative for dizziness and headaches.  Negative for lightheadedness    PHYSICAL EXAM: Blood pressure (!) 145/69, pulse (!) 56, temperature 98.2 F (36.8 C), temperature source Oral, height 5\' 3"  (1.6 m), weight 244 lb (110.7 kg), last menstrual period 11/25/2002, SpO2 95 %. Body mass index is 43.22 kg/m. Physical Exam  Constitutional: She is oriented to person, place, and time. She appears well-developed and well-nourished.  Cardiovascular: Normal rate.  Pulmonary/Chest: Effort normal.  Musculoskeletal: Normal range of motion.  Neurological: She is oriented to person, place, and time.  Skin: Skin is warm and dry.  Psychiatric: She has a normal mood and affect. Her behavior is normal.  Vitals reviewed.   RECENT LABS AND TESTS: BMET    Component Value Date/Time   NA 138 04/29/2017 0935   K 4.5 04/29/2017 0935   CL 97 04/29/2017 0935   CO2 24 04/29/2017 0935   GLUCOSE 89 04/29/2017 0935   GLUCOSE 108 (H) 07/16/2015 1339   BUN 28 (H) 04/29/2017 0935   CREATININE 0.88 04/29/2017 0935   CREATININE 1.24 (H) 07/16/2015 1339   CALCIUM 9.8 04/29/2017 0935   GFRNONAA 69 04/29/2017 0935   GFRAA 80 04/29/2017 0935   Lab Results  Component Value Date   HGBA1C 6.9 (H) 04/29/2017   HGBA1C 7.0 (H) 07/16/2015   Lab Results  Component Value Date   INSULIN 36.0 (H) 04/29/2017   CBC    Component Value Date/Time   WBC 7.5 04/29/2017 0935   WBC 5.9 07/16/2015 1339   RBC 4.46 04/29/2017 0935   RBC 3.76 (L) 07/16/2015 1339   HGB 13.1 04/29/2017 0935   HGB 11.2 (L) 07/11/2014 1340   HCT 39.4 04/29/2017 0935   PLT 267 07/16/2015 1339   MCV 88  04/29/2017 0935   MCH 29.4 04/29/2017 0935   MCH 28.7 07/16/2015 1339   MCHC 33.2 04/29/2017 0935   MCHC 31.9 (L) 07/16/2015 1339   RDW 14.2 04/29/2017 0935   LYMPHSABS 2.1 04/29/2017 0935   MONOABS 708 07/16/2015 1339   EOSABS 0.2 04/29/2017 0935   BASOSABS 0.0 04/29/2017 0935   Iron/TIBC/Ferritin/ %Sat No results found for: IRON, TIBC, FERRITIN, IRONPCTSAT Lipid Panel     Component Value Date/Time   CHOL 223 (H) 04/29/2017 0935   TRIG 420 (H) 04/29/2017 0935   HDL 36 (L) 04/29/2017 0935   CHOLHDL 5.9 (H) 07/16/2015 1339   VLDL NOT CALC 07/16/2015 1339   LDLCALC Comment 04/29/2017 0935   Hepatic Function Panel     Component Value Date/Time   PROT 7.3 04/29/2017 0935   ALBUMIN 4.4 04/29/2017 0935   AST 19 04/29/2017 0935   ALT 30 04/29/2017 0935   ALKPHOS 69 04/29/2017 0935   BILITOT 0.3 04/29/2017 0935      Component Value Date/Time   TSH 3.010 04/29/2017 0935   TSH 3.96 07/16/2015 1339   Results for CANDISE, CRABTREE (MRN [id number]) as of 05/18/2017 11:30  Ref. Range 04/29/2017 09:35  Vitamin D, 25-Hydroxy Latest Ref Range: 30.0 - 100.0 ng/mL 29.7 (L)   ASSESSMENT AND PLAN: Vitamin D deficiency - Plan: Vitamin D, Ergocalciferol, (DRISDOL) 50000 units CAPS capsule  Dehydration  Mixed hyperlipidemia  Essential hypertension  Class 3 severe obesity with serious comorbidity and body mass index (BMI) of 40.0 to 44.9 in adult, unspecified obesity type ([place])  PLAN:  Vitamin D Deficiency [name] [name] was informed that low vitamin D levels contributes to fatigue and are associated with obesity, breast, and colon cancer. She agrees to start to take prescription Vit D @50 ,000 IU  every week #4 with no refills and will follow up for routine testing of vitamin D, at least 2-3 times per year. She was informed of the risk of over-replacement of vitamin D and agrees to not increase her dose unless she discusses this with [name] first. [name] [name] agrees to follow up with our clinic in 2  weeks.  Dehydration [name] [name] will increase her H2O intake and we will recheck labs in 3 months. [name] [name] agrees to follow up as directed.  Mixed Hyperlipidemia [name] [name] was informed of the American Heart Association Guidelines emphasizing intensive lifestyle modifications as the first line treatment for hyperlipidemia. We discussed many lifestyle modifications today in depth, and [name] [name] will continue to work on decreasing saturated fats such as fatty red meat, butter and many fried foods. She will also increase vegetables and lean protein in her diet and continue to work on exercise and weight loss efforts. We will recheck labs in 3 months and [name] [name] agrees to follow up as directed.  Hypertension We discussed sodium restriction, working on healthy weight loss, and a regular exercise program as the means to achieve improved blood pressure control. [name] [name] agreed with this plan and agreed to follow up as directed. We will continue to monitor her blood pressure as well as her progress with the above lifestyle modifications. She will continue her medications as prescribed and will watch for signs of hypotension as she continues her lifestyle modifications. We may need to adjust medications if her blood pressure is elevated at her next visit in 2 weeks.  Obesity [name] [name] is currently in the action stage of change. As such, her goal is to continue with weight loss efforts She has agreed to follow the Category 2 plan [name] [name] has been instructed to work up to a goal of 150 minutes of combined cardio and strengthening exercise per week for weight loss and overall health benefits. We discussed the following Behavioral Modification Strategies today: increasing lean protein intake, decreasing simple carbohydrates  and work on meal planning and easy cooking plans  [name] [name] has agreed to follow up with our clinic in 2 weeks. She was informed of the importance of frequent follow up visits to maximize her  success with intensive lifestyle modifications for her multiple health conditions.   OBESITY BEHAVIORAL INTERVENTION VISIT  Today's visit was # 2 out of 22.  Starting weight: 248 lbs Starting date: 04/29/17 Today's weight : 244 lbs Today's date: 05/14/2017 Total lbs lost to date: 4 (Patients must lose 7 lbs in the first 6 months to continue with counseling)   ASK: We discussed the diagnosis of obesity with [name] [name] today and [name] [name] agreed to give [name] permission to discuss obesity behavioral modification therapy today.  ASSESS: [name] [name] has the diagnosis of obesity and her BMI today is 43.23 [name] [name] is in the action stage of change   ADVISE: Anavictoria was educated on the multiple health risks of obesity as well as the benefit of weight loss to improve her health. She was advised of the need for long term treatment and the importance of lifestyle modifications.  AGREE: Multiple dietary modification options and treatment options were discussed and  Abiageal agreed to the above obesity treatment plan.  I, Doreene Nest, am acting as transcriptionist for Dennard Nip, MD I have reviewed the above documentation for accuracy and completeness, and I agree with the above. -Dennard Nip, MD

## 2017-05-24 ENCOUNTER — Encounter (HOSPITAL_BASED_OUTPATIENT_CLINIC_OR_DEPARTMENT_OTHER): Payer: Self-pay | Admitting: Emergency Medicine

## 2017-05-24 ENCOUNTER — Emergency Department (HOSPITAL_BASED_OUTPATIENT_CLINIC_OR_DEPARTMENT_OTHER): Payer: Medicare Other

## 2017-05-24 ENCOUNTER — Emergency Department (HOSPITAL_BASED_OUTPATIENT_CLINIC_OR_DEPARTMENT_OTHER)
Admission: EM | Admit: 2017-05-24 | Discharge: 2017-05-24 | Disposition: A | Discharge status: Home / Self Care | Payer: Medicare Other | Attending: Emergency Medicine | Admitting: Emergency Medicine

## 2017-05-24 ENCOUNTER — Other Ambulatory Visit: Payer: Self-pay

## 2017-05-24 DIAGNOSIS — R1033 Periumbilical pain: Secondary | ICD-10-CM | POA: Diagnosis not present

## 2017-05-24 DIAGNOSIS — I7090 Unspecified atherosclerosis: Secondary | ICD-10-CM | POA: Diagnosis not present

## 2017-05-24 DIAGNOSIS — M455 Ankylosing spondylitis of thoracolumbar region: Secondary | ICD-10-CM | POA: Insufficient documentation

## 2017-05-24 DIAGNOSIS — R1011 Right upper quadrant pain: Secondary | ICD-10-CM | POA: Diagnosis not present

## 2017-05-24 DIAGNOSIS — E119 Type 2 diabetes mellitus without complications: Secondary | ICD-10-CM | POA: Insufficient documentation

## 2017-05-24 DIAGNOSIS — I10 Essential (primary) hypertension: Secondary | ICD-10-CM | POA: Insufficient documentation

## 2017-05-24 DIAGNOSIS — I70209 Unspecified atherosclerosis of native arteries of extremities, unspecified extremity: Secondary | ICD-10-CM | POA: Diagnosis not present

## 2017-05-24 DIAGNOSIS — Z7984 Long term (current) use of oral hypoglycemic drugs: Secondary | ICD-10-CM | POA: Insufficient documentation

## 2017-05-24 DIAGNOSIS — R110 Nausea: Secondary | ICD-10-CM | POA: Diagnosis not present

## 2017-05-24 DIAGNOSIS — Z8542 Personal history of malignant neoplasm of other parts of uterus: Secondary | ICD-10-CM | POA: Insufficient documentation

## 2017-05-24 DIAGNOSIS — Z79899 Other long term (current) drug therapy: Secondary | ICD-10-CM | POA: Insufficient documentation

## 2017-05-24 DIAGNOSIS — R109 Unspecified abdominal pain: Secondary | ICD-10-CM

## 2017-05-24 DIAGNOSIS — K5730 Diverticulosis of large intestine without perforation or abscess without bleeding: Secondary | ICD-10-CM | POA: Diagnosis not present

## 2017-05-24 DIAGNOSIS — Z96652 Presence of left artificial knee joint: Secondary | ICD-10-CM | POA: Insufficient documentation

## 2017-05-24 DIAGNOSIS — Z96641 Presence of right artificial hip joint: Secondary | ICD-10-CM | POA: Diagnosis not present

## 2017-05-24 LAB — I-STAT CG4 LACTIC ACID, ED
Lactic Acid, Venous: 2.55 mmol/L (ref 0.5–1.9)
Lactic Acid, Venous: 2.21 mmol/L (ref 0.5–1.9)

## 2017-05-24 LAB — CBC WITH DIFFERENTIAL/PLATELET
BASOS ABS: 0 10*3/uL (ref 0.0–0.1)
BASOS PCT: 0 %
EOS ABS: 0.1 10*3/uL (ref 0.0–0.7)
Eosinophils Relative: 1 %
HEMATOCRIT: 40.5 % (ref 36.0–46.0)
HEMOGLOBIN: 13.2 g/dL (ref 12.0–15.0)
Lymphocytes Relative: 21 %
Lymphs Abs: 1.8 10*3/uL (ref 0.7–4.0)
MCH: 29.9 pg (ref 26.0–34.0)
MCHC: 32.6 g/dL (ref 30.0–36.0)
MCV: 91.8 fL (ref 78.0–100.0)
Monocytes Absolute: 0.7 10*3/uL (ref 0.1–1.0)
Monocytes Relative: 9 %
NEUTROS ABS: 5.8 10*3/uL (ref 1.7–7.7)
NEUTROS PCT: 69 %
Platelets: 262 10*3/uL (ref 150–400)
RBC: 4.41 MIL/uL (ref 3.87–5.11)
RDW: 13.7 % (ref 11.5–15.5)
WBC: 8.4 10*3/uL (ref 4.0–10.5)

## 2017-05-24 LAB — COMPREHENSIVE METABOLIC PANEL
ALBUMIN: 4.1 g/dL (ref 3.5–5.0)
ALK PHOS: 63 U/L (ref 38–126)
ALT: 31 U/L (ref 14–54)
ANION GAP: 11 (ref 5–15)
AST: 23 U/L (ref 15–41)
BILIRUBIN TOTAL: 0.5 mg/dL (ref 0.3–1.2)
BUN: 26 mg/dL — AB (ref 6–20)
CALCIUM: 9.9 mg/dL (ref 8.9–10.3)
CO2: 28 mmol/L (ref 22–32)
Chloride: 103 mmol/L (ref 101–111)
Creatinine, Ser: 0.96 mg/dL (ref 0.44–1.00)
GFR calc Af Amer: >60 mL/min (ref >60)
GFR calc non Af Amer: >60 mL/min (ref >60)
GLUCOSE: 69 mg/dL (ref 65–99)
POTASSIUM: 4 mmol/L (ref 3.5–5.1)
SODIUM: 142 mmol/L (ref 135–145)
TOTAL PROTEIN: 7.8 g/dL (ref 6.5–8.1)

## 2017-05-24 LAB — LIPASE, BLOOD: Lipase: 42 U/L (ref 11–51)

## 2017-05-24 LAB — URINALYSIS, ROUTINE W REFLEX MICROSCOPIC
Bilirubin Urine: NEGATIVE
GLUCOSE, UA: NEGATIVE mg/dL
Hgb urine dipstick: NEGATIVE
KETONES UR: NEGATIVE mg/dL
LEUKOCYTES UA: NEGATIVE
Nitrite: NEGATIVE
Protein, ur: NEGATIVE mg/dL
Specific Gravity, Urine: 1.01 (ref 1.005–1.030)
pH: 5.5 (ref 5.0–8.0)

## 2017-05-24 LAB — TROPONIN I

## 2017-05-24 MED ORDER — ONDANSETRON HCL 4 MG PO TABS: 4.0000 mg | ORAL_TABLET | Freq: Three times a day (TID) | ORAL | 0 refills | Status: DC | PRN

## 2017-05-24 MED ORDER — SODIUM CHLORIDE 0.9 % IV BOLUS
1000.0000 mL | Freq: Once | INTRAVENOUS | Status: AC
Start: 2017-05-24 — End: 2017-05-24
  Administered 2017-05-24: 1000 mL via INTRAVENOUS

## 2017-05-24 MED ORDER — IOPAMIDOL (ISOVUE-370) INJECTION 76%
100.0000 mL | Freq: Once | INTRAVENOUS | Status: AC | PRN
  Administered 2017-05-24: 100 mL via INTRAVENOUS

## 2017-05-24 MED ORDER — DICYCLOMINE HCL 20 MG PO TABS: 20.0000 mg | ORAL_TABLET | Freq: Two times a day (BID) | ORAL | 0 refills | Status: DC

## 2017-05-24 MED ORDER — FENTANYL CITRATE (PF) 100 MCG/2ML IJ SOLN
50.0000 ug | Freq: Once | INTRAMUSCULAR | Status: AC | PRN
  Administered 2017-05-24: 50 ug via INTRAVENOUS
  Filled 2017-05-24: qty 2

## 2017-05-24 NOTE — ED Notes (Signed)
Pt remains on cardiac monitor and auto VS 

## 2017-05-24 NOTE — ED Triage Notes (Signed)
Patient states that she woke up this am with  Abdominal pain. The patient reports that she she took a Nausea medication so denies any N/V/D at this time

## 2017-05-24 NOTE — Discharge Instructions (Signed)

## 2017-05-24 NOTE — ED Notes (Signed)
Date and time results received: 05/24/17 1604 (use smartphrase ".now" to insert current time)  Test: Lactic Acid Critical Value: 2.21  Name of Provider Notified: Margarita Mail, PA  Orders Received? Or Actions Taken?:

## 2017-05-24 NOTE — ED Provider Notes (Signed)
[facility] EMERGENCY DEPARTMENT Provider Note   CSN: [id number] Arrival date & time: 05/24/17  1413     History   Chief Complaint Chief Complaint  Patient presents with  . Abdominal Pain    HPI [name] [name] is a 67 y.o. female. Who presents the emergency department for evaluation of right-sided abdominal pain.  She states that she had sudden onset of abdominal pain this morning.  She describes the pain as colicky, right-sided.  She states that she feels distention on that side and it feels crampy but is moderate to severely painful and radiates to her right lower back.  She states that it makes her feel weak when the pain comes.  She had some nausea but took an antinausea medication prior to arrival and has not vomited.  She denies diarrhea or constipation.  She does have a history of diverticulitis in the private past but is always been on the left side.  Patient points to the periumbilical region the right upper and lower quadrants and suprapubic region when describing her pain.  She denies any urinary symptoms.  She denies a history of kidney stones.  She declines pain medication currently.  She states that she is unable to reproduce pain with palpation of her abdomen at home.  She denies chest pain, shortness of breath.  She has a history of arterial disease.  She has a previous surgical history of cholecystectomy HPI  Past Medical History:  Diagnosis Date  . Arthritis    "hands, neck, right knee" (11/29/2013)  . Biliary dyskinesia 07/2006  . Cancer, uterine ([place]) 05/2003   F1G01,G1  . Depression   . Diverticulosis    Hx: of  . Gastroparesis 7/08   Dr. Collene Mares  . GERD (gastroesophageal reflux disease)   . HTN (hypertension) 2005  . Hyperlipidemia   . OSA on CPAP   . Pneumonia 1990's X 1  . Type II diabetes mellitus ([place]) 2005  . Urinary, incontinence, stress female 12/13   using Pessary 01/2012    Patient Active Problem List   Diagnosis Date Noted  . Other  fatigue 04/29/2017  . Shortness of breath on exertion 04/29/2017  . Vitamin D deficiency 04/29/2017  . Hypersomnolence 06/04/2016  . Bilateral carotid bruits 11/30/2013  . Dyspnea on exertion 11/30/2013  . Osteoarthritis of right hip 09/02/2013  . Cystocele 07/03/2013  . Vaginal pessary present 04/25/2013  . Prolapse of vaginal vault after hysterectomy 04/25/2013  . Endometrial cancer, grade I ([place]) 04/25/2013  . Unspecified vitamin D deficiency 04/25/2013  . Osteoarthritis of left knee 08/02/2012  . Urinary, incontinence, stress female 06/07/2012  . HTN (hypertension) 06/07/2012  . Diabetes ([place]) 06/07/2012  . OSA (obstructive sleep apnea) 02/16/2012    Past Surgical History:  Procedure Laterality Date  . CARDIAC CATHETERIZATION  11/29/2013  . COLONOSCOPY W/ BIOPSIES  10/04   polyp  . COLONOSCOPY W/ BIOPSIES  02/12/05   benign polyp  . COLONOSCOPY W/ BIOPSIES  10/26/09   1 polyp benign, recheck 5 years  . COLONOSCOPY WITH ESOPHAGOGASTRODUODENOSCOPY (EGD)  09/14/06   2 polyps, diverticula, diffuse gastritis  . JOINT REPLACEMENT    . KNEE ARTHROSCOPY Left 2010  . KNEE ARTHROSCOPY Right 11/2012  . LAPAROSCOPIC CHOLECYSTECTOMY  10/2006  . LEFT HEART CATHETERIZATION WITH CORONARY ANGIOGRAM N/A 11/29/2013   Procedure: LEFT HEART CATHETERIZATION WITH CORONARY ANGIOGRAM;  Surgeon: Laverda Page, MD;  Location: [facility] CATH LAB;  Service: Cardiovascular;  Laterality: N/A;  . TOTAL ABDOMINAL HYSTERECTOMY  05/2003   w/BSO; endometrial carcinoma F1G01, G1  . TOTAL HIP ARTHROPLASTY Right 09/02/2013   Procedure: RIGHT TOTAL HIP ARTHROPLASTY ANTERIOR APPROACH;  Surgeon: Alta Corning, MD;  Location: [place];  Service: Orthopedics;  Laterality: Right;  . TOTAL KNEE ARTHROPLASTY Left 08/02/2012   Procedure: TOTAL KNEE ARTHROPLASTY;  Surgeon: Alta Corning, MD;  Location: [place];  Service: Orthopedics;  Laterality: Left;  left total knee arthroplasty  . TUBAL LIGATION       OB History    Gravida  3     Para  3   Term  3   Preterm  0   AB  0   Living  3     SAB  0   TAB  0   Ectopic  0   Multiple  0   Live Births  3            Home Medications    Prior to Admission medications   Medication Sig Start Date End Date Taking? Authorizing Provider  amLODipine (NORVASC) 10 MG tablet Take 1 tablet (10 mg total) by mouth daily. 11/30/13   Adrian Prows, MD  atorvastatin (LIPITOR) 80 MG tablet Take 1 tablet by mouth daily. 04/11/15   [provider]  benazepril (LOTENSIN) 40 MG tablet Take 40 mg by mouth daily.    [provider]  bisoprolol (ZEBETA) 10 MG tablet Take 10 mg by mouth daily.    [provider]  cetirizine (ZYRTEC) 10 MG tablet Take 10 mg by mouth daily.    [provider]  chlorthalidone (HYGROTON) 25 MG tablet 1 table once daily 12/30/13   [provider]  escitalopram (LEXAPRO) 20 MG tablet Take 20 mg by mouth daily.     [provider]  linagliptin (TRADJENTA) 5 MG TABS tablet Take by mouth. 01/13/17   [provider]  meloxicam (MOBIC) 15 MG tablet Take 15 mg by mouth daily.    [provider]  metFORMIN (GLUCOPHAGE) 1000 MG tablet Take 1 tablet (1,000 mg total) by mouth 2 (two) times daily with a meal. 12/01/13   Adrian Prows, MD  modafinil (PROVIGIL) 100 MG tablet TAKE 1 TABLET BY MOUTH EVERY DAY 04/27/17   Rigoberto Noel, MD  Multiple Vitamins-Minerals (SENTRY SENIOR PO) Take 1 tablet by mouth daily.    [provider]  Omega-3 Fatty Acids (FISH OIL) 1000 MG CAPS Take 1 capsule by mouth daily.    [provider]  omeprazole (PRILOSEC) 20 MG capsule Take 20 mg by mouth 2 (two) times daily before a meal.     [provider]  Vitamin D, Ergocalciferol, (DRISDOL) 50000 units CAPS capsule Take 1 capsule (50,000 Units total) by mouth every 7 (seven) days. 05/14/17   Starlyn Skeans, MD    Family History Family History  Problem Relation Age of Onset  . Heart disease  Father 54       CABG  . Lung cancer Father 43       lung  . Hypertension Father   . Diabetes Father   . High Cholesterol Father   . Diabetes type II Father   . Heart disease Sister 11       CABG  . Diabetes Sister 19  . Heart disease Brother 16       MI with stents  . Hypertension Mother   . Diabetes Mother   . High Cholesterol Mother   . Sudden death Mother   . Thyroid  disease Mother   . Obesity Mother   . Hypertension Sister        x2   . Diabetes Sister   . Hypertension Brother   . Diabetes Brother   . Cancer Sister        high grade Neuroendocrine carcinoma right axilla  . Pancreatic cancer Other        x 3 - 2 MAunts and 1 P Aunts.   . Lupus Other        Pat Aunt    Social History Social History   Tobacco Use  . Smoking status: Never Smoker  . Smokeless tobacco: Never Used  Substance Use Topics  . Alcohol use: No  . Drug use: No     Allergies   Codeine   Review of Systems Review of Systems  Ten systems reviewed and are negative for acute change, except as noted in the HPI.   Physical Exam Updated Vital Signs BP (!) 161/65 (BP Location: Left Arm)   Pulse 60   Temp 98.4 F (36.9 C) (Oral)   Resp 18   LMP 11/25/2002 (Approximate)   SpO2 93%   Physical Exam  Constitutional: She is oriented to person, place, and time. She appears well-developed and well-nourished. No distress.  HENT:  Head: Normocephalic and atraumatic.  Eyes: Conjunctivae are normal. No scleral icterus.  Neck: Normal range of motion.  Cardiovascular: Normal rate, regular rhythm and normal heart sounds. Exam reveals no gallop and no friction rub.  No murmur heard. Pulmonary/Chest: Effort normal and breath sounds normal. No respiratory distress.  Abdominal: Soft. Bowel sounds are normal. She exhibits no distension and no mass. There is no tenderness. There is no guarding.  No tenderness to palpation of the abdomen, normal bowel sounds, no visible distention rigidity or guarding  no CVA tenderness  Neurological: She is alert and oriented to person, place, and time.  Skin: Skin is warm and dry. She is not diaphoretic.  Psychiatric: Her behavior is normal.  Nursing note and vitals reviewed.    ED Treatments / Results  Labs (all labs ordered are listed, but only abnormal results are displayed) Labs Reviewed  URINALYSIS, ROUTINE W REFLEX MICROSCOPIC    EKG None  Radiology No results found.  Procedures Procedures (including critical care time)  Medications Ordered in ED Medications - No data to display   Initial Impression / Assessment and Plan / ED Course  I have reviewed the triage vital signs and the nursing notes.  Pertinent labs & imaging results that were available during my care of the patient were reviewed by me and considered in my medical decision making (see chart for details).  Clinical Course as of May 25 1610  Sun May 24, 2017  1556 Patient complaining of thickened pain however she has no reproducible abdominal pain on examination.  Given her history of diabetes hypertension hypercholesterolemia and known coronary artery disease I have potential concern for mesenteric ischemia.  I have ordered a lactic acid to evaluate the patient.   [AH]    Clinical Course User Index [AH] Margarita Mail, PA-C    Patient is nontoxic, nonseptic appearing, in no apparent distress.  Patient's pain and other symptoms adequately managed in emergency department.  Fluid bolus given.  Labs, imaging and vitals reviewed.  Patient does not meet the SIRS or Sepsis criteria.  On repeat exam patient does not have a surgical abdomen and there are no peritoneal signs.  No indication of appendicitis, bowel obstruction, bowel perforation,  cholecystitis, diverticulitis,. Her Lactate has decreased over time with fluids and she has no acute pain. No occlusive mesenteric arterial disease.  Patient discharged home with symptomatic treatment and given strict instructions for  follow-up with their primary care physician.  I have also discussed reasons to return immediately to the ER.  Patient expresses understanding and agrees with plan.     Final Clinical Impressions(s) / ED Diagnoses   Final diagnoses:  Abdominal pain, unspecified abdominal location  Nausea  Arterioloscleroses    ED Discharge Orders    None       Margarita Mail, PA-C 05/25/17 0105    Little, Wenda Overland, MD 05/26/17 1500

## 2017-05-24 NOTE — ED Notes (Signed)
Date and time results received: 05/24/17 now (use smartphrase ".now" to insert current time)  Test: LACTIC ACID Critical Value: 2.55  Name of Provider Notified: A Harris  Orders Received? Or Actions Taken?:

## 2017-06-01 ENCOUNTER — Ambulatory Visit (INDEPENDENT_AMBULATORY_CARE_PROVIDER_SITE_OTHER): Payer: Medicare Other | Admitting: Family Medicine

## 2017-06-01 VITALS — BP 137/72 | HR 47 | Temp 97.8°F | Ht 63.0 in | Wt 246.0 lb

## 2017-06-01 DIAGNOSIS — E559 Vitamin D deficiency, unspecified: Secondary | ICD-10-CM | POA: Diagnosis not present

## 2017-06-01 DIAGNOSIS — E11649 Type 2 diabetes mellitus with hypoglycemia without coma: Secondary | ICD-10-CM

## 2017-06-01 DIAGNOSIS — R001 Bradycardia, unspecified: Secondary | ICD-10-CM | POA: Diagnosis not present

## 2017-06-01 DIAGNOSIS — Z6841 Body Mass Index (BMI) 40.0 and over, adult: Secondary | ICD-10-CM

## 2017-06-01 NOTE — Progress Notes (Signed)
Office: [phone]  /  Fax: [phone]   HPI:   Chief Complaint: OBESITY [name] [name] is here to discuss her progress with her obesity treatment plan. She is on the Category 2 plan and is following her eating plan approximately 80 % of the time. She states she is exercising 0 minutes 0 times per week. [name] [name] had 3 birthdays in the past couple of weeks. Struggling with spacing of food and thinks she may be eating too many snacks. She is not weighing meat at lunch and only eating 1 slice of deli meat at lunch.  Her weight is 246 lb (111.6 kg) today and has gained 2 pounds since her last visit. She has lost 2 lbs since starting treatment with [name].  Diabetes II [name] [name] has a diagnosis of diabetes type II. [name] [name] states fasting BGs range between 100 and 130's. She was taking half glipizide because her primary care physician told her not to stop sulfonylurea. She denies any hypoglycemic episodes. Last A1c was 6.9 on 04/29/17. She has been working on intensive lifestyle modifications including diet, exercise, and weight loss to help control her blood glucose levels.  Bradycardia [name] [name]'s heart rate in 40's and she notes occasionally experiencing lightheadedness.  Vitamin D Deficiency [name] [name] has a diagnosis of vitamin D deficiency. She is currently taking prescription Vit D and denies nausea, vomiting or muscle weakness.  ALLERGIES: Allergies  Allergen Reactions  . Codeine Palpitations    MEDICATIONS: Current Outpatient Medications on File Prior to Visit  Medication Sig Dispense Refill  . amLODipine (NORVASC) 10 MG tablet Take 1 tablet (10 mg total) by mouth daily. 30 tablet 2  . atorvastatin (LIPITOR) 80 MG tablet Take 1 tablet by mouth daily.  5  . benazepril (LOTENSIN) 40 MG tablet Take 40 mg by mouth daily.    . bisoprolol (ZEBETA) 10 MG tablet Take 10 mg by mouth daily.    . cetirizine (ZYRTEC) 10 MG tablet Take 10 mg by mouth daily.    . chlorthalidone (HYGROTON) 25 MG tablet  1 table once daily  3  . dicyclomine (BENTYL) 20 MG tablet Take 1 tablet (20 mg total) by mouth 2 (two) times daily. 20 tablet 0  . escitalopram (LEXAPRO) 20 MG tablet Take 20 mg by mouth daily.     Marland Kitchen linagliptin (TRADJENTA) 5 MG TABS tablet Take by mouth.    . meloxicam (MOBIC) 15 MG tablet Take 15 mg by mouth daily.    . metFORMIN (GLUCOPHAGE) 1000 MG tablet Take 1 tablet (1,000 mg total) by mouth 2 (two) times daily with a meal.    . modafinil (PROVIGIL) 100 MG tablet TAKE 1 TABLET BY MOUTH EVERY DAY 30 tablet 2  . Multiple Vitamins-Minerals (SENTRY SENIOR PO) Take 1 tablet by mouth daily.    . Omega-3 Fatty Acids (FISH OIL) 1000 MG CAPS Take 1 capsule by mouth daily.    Marland Kitchen omeprazole (PRILOSEC) 20 MG capsule Take 20 mg by mouth 2 (two) times daily before a meal.     . ondansetron (ZOFRAN) 4 MG tablet Take 1 tablet (4 mg total) by mouth every 8 (eight) hours as needed for nausea or vomiting. 10 tablet 0  . Vitamin D, Ergocalciferol, (DRISDOL) 50000 units CAPS capsule Take 1 capsule (50,000 Units total) by mouth every 7 (seven) days. 4 capsule 0   No current facility-administered medications on file prior to visit.     PAST MEDICAL HISTORY: Past Medical History:  Diagnosis Date  . Arthritis    "hands,  neck, right knee" (11/29/2013)  . Biliary dyskinesia 07/2006  . Cancer, uterine ([place]) 05/2003   F1G01,G1  . Depression   . Diverticulosis    Hx: of  . Gastroparesis 7/08   Dr. Collene Mares  . GERD (gastroesophageal reflux disease)   . HTN (hypertension) 2005  . Hyperlipidemia   . OSA on CPAP   . Pneumonia 1990's X 1  . Type II diabetes mellitus ([place]) 2005  . Urinary, incontinence, stress female 12/13   using [place] 01/2012    PAST SURGICAL HISTORY: Past Surgical History:  Procedure Laterality Date  . CARDIAC CATHETERIZATION  11/29/2013  . COLONOSCOPY W/ BIOPSIES  10/04   polyp  . COLONOSCOPY W/ BIOPSIES  02/12/05   benign polyp  . COLONOSCOPY W/ BIOPSIES  10/26/09   1 polyp benign,  recheck 5 years  . COLONOSCOPY WITH ESOPHAGOGASTRODUODENOSCOPY (EGD)  09/14/06   2 polyps, diverticula, diffuse gastritis  . JOINT REPLACEMENT    . KNEE ARTHROSCOPY Left 2010  . KNEE ARTHROSCOPY Right 11/2012  . LAPAROSCOPIC CHOLECYSTECTOMY  10/2006  . LEFT HEART CATHETERIZATION WITH CORONARY ANGIOGRAM N/A 11/29/2013   Procedure: LEFT HEART CATHETERIZATION WITH CORONARY ANGIOGRAM;  Surgeon: Laverda Page, MD;  Location: [facility] CATH LAB;  Service: Cardiovascular;  Laterality: N/A;  . TOTAL ABDOMINAL HYSTERECTOMY  05/2003   w/BSO; endometrial carcinoma F1G01, G1  . TOTAL HIP ARTHROPLASTY Right 09/02/2013   Procedure: RIGHT TOTAL HIP ARTHROPLASTY ANTERIOR APPROACH;  Surgeon: Alta Corning, MD;  Location: [place];  Service: Orthopedics;  Laterality: Right;  . TOTAL KNEE ARTHROPLASTY Left 08/02/2012   Procedure: TOTAL KNEE ARTHROPLASTY;  Surgeon: Alta Corning, MD;  Location: [place];  Service: Orthopedics;  Laterality: Left;  left total knee arthroplasty  . TUBAL LIGATION      SOCIAL HISTORY: Social History   Tobacco Use  . Smoking status: Never Smoker  . Smokeless tobacco: Never Used  Substance Use Topics  . Alcohol use: No  . Drug use: No    FAMILY HISTORY: Family History  Problem Relation Age of Onset  . Heart disease Father 74       CABG  . Lung cancer Father 86       lung  . Hypertension Father   . Diabetes Father   . High Cholesterol Father   . Diabetes type II Father   . Heart disease Sister 84       CABG  . Diabetes Sister 88  . Heart disease Brother 33       MI with stents  . Hypertension Mother   . Diabetes Mother   . High Cholesterol Mother   . Sudden death Mother   . Thyroid disease Mother   . Obesity Mother   . Hypertension Sister        x2   . Diabetes Sister   . Hypertension Brother   . Diabetes Brother   . Cancer Sister        high grade Neuroendocrine carcinoma right axilla  . Pancreatic cancer Other        x 3 - 2 MAunts and 1 P Aunts.   . Lupus Other          Pat Aunt    ROS: Review of Systems  Constitutional: Negative for weight loss.  Gastrointestinal: Negative for nausea and vomiting.  Musculoskeletal:       Negative muscle weakness  Neurological:       Positive lightheadedness  Endo/Heme/Allergies:       Negative  hypoglycemia    PHYSICAL EXAM: Blood pressure 137/72, pulse (!) 47, temperature 97.8 F (36.6 C), temperature source Oral, height 5\' 3"  (1.6 m), weight 246 lb (111.6 kg), last menstrual period 11/25/2002, SpO2 95 %. Body mass index is 43.58 kg/m. Physical Exam  Constitutional: She is oriented to person, place, and time. She appears well-developed and well-nourished.  Cardiovascular: Normal rate.  Pulmonary/Chest: Effort normal.  Musculoskeletal: Normal range of motion.  Neurological: She is oriented to person, place, and time.  Skin: Skin is warm and dry.  Psychiatric: She has a normal mood and affect. Her behavior is normal.  Vitals reviewed.   RECENT LABS AND TESTS: BMET    Component Value Date/Time   NA 142 05/24/2017 1604   NA 138 04/29/2017 0935   K 4.0 05/24/2017 1604   CL 103 05/24/2017 1604   CO2 28 05/24/2017 1604   GLUCOSE 69 05/24/2017 1604   BUN 26 (H) 05/24/2017 1604   BUN 28 (H) 04/29/2017 0935   CREATININE 0.96 05/24/2017 1604   CREATININE 1.24 (H) 07/16/2015 1339   CALCIUM 9.9 05/24/2017 1604   GFRNONAA >60 05/24/2017 1604   GFRAA >60 05/24/2017 1604   Lab Results  Component Value Date   HGBA1C 6.9 (H) 04/29/2017   HGBA1C 7.0 (H) 07/16/2015   Lab Results  Component Value Date   INSULIN 36.0 (H) 04/29/2017   CBC    Component Value Date/Time   WBC 8.4 05/24/2017 1604   RBC 4.41 05/24/2017 1604   HGB 13.2 05/24/2017 1604   HGB 13.1 04/29/2017 0935   HGB 11.2 (L) 07/11/2014 1340   HCT 40.5 05/24/2017 1604   HCT 39.4 04/29/2017 0935   PLT 262 05/24/2017 1604   MCV 91.8 05/24/2017 1604   MCV 88 04/29/2017 0935   MCH 29.9 05/24/2017 1604   MCHC 32.6 05/24/2017 1604   RDW  13.7 05/24/2017 1604   RDW 14.2 04/29/2017 0935   LYMPHSABS 1.8 05/24/2017 1604   LYMPHSABS 2.1 04/29/2017 0935   MONOABS 0.7 05/24/2017 1604   EOSABS 0.1 05/24/2017 1604   EOSABS 0.2 04/29/2017 0935   BASOSABS 0.0 05/24/2017 1604   BASOSABS 0.0 04/29/2017 0935   Iron/TIBC/Ferritin/ %Sat No results found for: IRON, TIBC, FERRITIN, IRONPCTSAT Lipid Panel     Component Value Date/Time   CHOL 223 (H) 04/29/2017 0935   TRIG 420 (H) 04/29/2017 0935   HDL 36 (L) 04/29/2017 0935   CHOLHDL 5.9 (H) 07/16/2015 1339   VLDL NOT CALC 07/16/2015 1339   LDLCALC Comment 04/29/2017 0935   Hepatic Function Panel     Component Value Date/Time   PROT 7.8 05/24/2017 1604   PROT 7.3 04/29/2017 0935   ALBUMIN 4.1 05/24/2017 1604   ALBUMIN 4.4 04/29/2017 0935   AST 23 05/24/2017 1604   ALT 31 05/24/2017 1604   ALKPHOS 63 05/24/2017 1604   BILITOT 0.5 05/24/2017 1604   BILITOT 0.3 04/29/2017 0935      Component Value Date/Time   TSH 3.010 04/29/2017 0935   TSH 3.96 07/16/2015 1339    ASSESSMENT AND PLAN: Type 2 diabetes mellitus with hypoglycemia without coma, without long-term current use of insulin (HCC)  Bradycardia  Vitamin D deficiency  Class 3 severe obesity with serious comorbidity and body mass index (BMI) of 40.0 to 44.9 in adult, unspecified obesity type ([place])  PLAN:  Diabetes II [name] [name] has been given extensive diabetes education by myself today including ideal fasting and post-prandial blood glucose readings, individual ideal Hgb A1c goals and hypoglycemia  prevention. We discussed the importance of good blood sugar control to decrease the likelihood of diabetic complications such as nephropathy, neuropathy, limb loss, blindness, coronary artery disease, and death. We discussed the importance of intensive lifestyle modification including diet, exercise and weight loss as the first line treatment for diabetes. [name] [name] agrees to stop glipizide, and continue testing BGs 2 times  per day, testing strips BGs BID. [name] [name] agrees to follow up with our clinic in 2 weeks.  Bradycardia [name] [name] was advised to call Dr. Einar Gip and ask if she should decrease her blood pressure medications. [name] [name] agrees to follow up with our clinic in 2 weeks.  Vitamin D Deficiency [name] [name] was informed that low vitamin D levels contributes to fatigue and are associated with obesity, breast, and colon cancer. [name] [name] agrees to continue taking prescription Vit D @50 ,000 IU every week #4 and will follow up for routine testing of vitamin D, at least 2-3 times per year. She was informed of the risk of over-replacement of vitamin D and agrees to not increase her dose unless she discusses this with [name] first. [name] [name] agrees to follow up with our clinic in 2 weeks.  We spent > than 50% of the 15 minute visit on the counseling as documented in the note.  Obesity [name] [name] is currently in the action stage of change. As such, her goal is to continue with weight loss efforts She has agreed to follow the Category 2 plan [name] [name] has been instructed to work up to a goal of 150 minutes of combined cardio and strengthening exercise per week for weight loss and overall health benefits. We discussed the following Behavioral Modification Strategies today: increasing lean protein intake, increasing vegetables, work on meal planning and easy cooking plans, and increase H20 intake   [name] [name] has agreed to follow up with our clinic in 2 weeks. She was informed of the importance of frequent follow up visits to maximize her success with intensive lifestyle modifications for her multiple health conditions.   OBESITY BEHAVIORAL INTERVENTION VISIT  Today's visit was # 3 out of 22.  Starting weight: 248 lbs Starting date: 04/29/17 Today's weight : 246 lbs Today's date: 06/01/2017 Total lbs lost to date: 2 (Patients must lose 7 lbs in the first 6 months to continue with counseling)   ASK: We discussed the  diagnosis of obesity with [name] [name] today and [name] [name] agreed to give [name] permission to discuss obesity behavioral modification therapy today.  ASSESS: [name] [name] has the diagnosis of obesity and her BMI today is 43.59 [name] [name] is in the action stage of change   ADVISE: [name] [name] was educated on the multiple health risks of obesity as well as the benefit of weight loss to improve her health. She was advised of the need for long term treatment and the importance of lifestyle modifications.  AGREE: Multiple dietary modification options and treatment options were discussed and  [place] agreed to the above obesity treatment plan.  I, Trixie Dredge, am acting as transcriptionist for [name] Qua, MD  I have reviewed the above documentation for accuracy and completeness, and I agree with the above. - [name] Qua, MD

## 2017-06-08 DIAGNOSIS — I2510 Atherosclerotic heart disease of native coronary artery without angina pectoris: Secondary | ICD-10-CM | POA: Diagnosis not present

## 2017-06-08 DIAGNOSIS — I10 Essential (primary) hypertension: Secondary | ICD-10-CM | POA: Diagnosis not present

## 2017-06-08 DIAGNOSIS — R002 Palpitations: Secondary | ICD-10-CM | POA: Diagnosis not present

## 2017-06-15 DIAGNOSIS — Z7984 Long term (current) use of oral hypoglycemic drugs: Secondary | ICD-10-CM | POA: Diagnosis not present

## 2017-06-15 DIAGNOSIS — H43393 Other vitreous opacities, bilateral: Secondary | ICD-10-CM | POA: Diagnosis not present

## 2017-06-15 DIAGNOSIS — I10 Essential (primary) hypertension: Secondary | ICD-10-CM | POA: Diagnosis not present

## 2017-06-15 DIAGNOSIS — E119 Type 2 diabetes mellitus without complications: Secondary | ICD-10-CM | POA: Diagnosis not present

## 2017-06-19 ENCOUNTER — Other Ambulatory Visit (INDEPENDENT_AMBULATORY_CARE_PROVIDER_SITE_OTHER): Payer: Self-pay | Admitting: Family Medicine

## 2017-06-19 DIAGNOSIS — E559 Vitamin D deficiency, unspecified: Secondary | ICD-10-CM

## 2017-06-23 ENCOUNTER — Ambulatory Visit (INDEPENDENT_AMBULATORY_CARE_PROVIDER_SITE_OTHER): Payer: Medicare Other | Admitting: Family Medicine

## 2017-06-23 VITALS — BP 149/72 | HR 49 | Temp 98.2°F | Ht 63.0 in | Wt 245.0 lb

## 2017-06-23 DIAGNOSIS — R001 Bradycardia, unspecified: Secondary | ICD-10-CM

## 2017-06-23 DIAGNOSIS — E11649 Type 2 diabetes mellitus with hypoglycemia without coma: Secondary | ICD-10-CM | POA: Diagnosis not present

## 2017-06-23 DIAGNOSIS — Z6841 Body Mass Index (BMI) 40.0 and over, adult: Secondary | ICD-10-CM

## 2017-06-24 NOTE — Progress Notes (Signed)
Office: [phone]  /  Fax: [phone]   HPI:   Chief Complaint: OBESITY [name] [name] is here to discuss her progress with her obesity treatment plan. She is on the Category 2 plan and is following her eating plan approximately 80 % of the time. She states she is walking 8,000 steps daily. [name] [name] is writing down everything she is eating. [name] [name] is breaking up meals secondary to her life schedule. Her weight is 245 lb (111.1 kg) today and has had a weight loss of 1 pound over a period of 3 weeks since her last visit. She has lost 3 lbs since starting treatment with [name].  Bradycardia [name] [name] just went to the doctor and her medication was changed (bisoprolol added). She endorses occasional dizziness or lightheadedness.  Diabetes II [name] [name] has a diagnosis of diabetes type II. [name] [name] states fasting BGs are in the 100's, maximum of 110 and no BGs over 200. [name] [name] denies any hypoglycemic episodes. She didn't bring her blood sugar log to her appointment today. She has been working on intensive lifestyle modifications including diet, exercise, and weight loss to help control her blood glucose levels.  ALLERGIES: Allergies  Allergen Reactions  . Codeine Palpitations    MEDICATIONS: Current Outpatient Medications on File Prior to Visit  Medication Sig Dispense Refill  . amLODipine (NORVASC) 10 MG tablet Take 1 tablet (10 mg total) by mouth daily. 30 tablet 2  . atorvastatin (LIPITOR) 80 MG tablet Take 1 tablet by mouth daily.  5  . benazepril (LOTENSIN) 40 MG tablet Take 40 mg by mouth daily.    . bisoprolol (ZEBETA) 10 MG tablet Take 10 mg by mouth daily.    . cetirizine (ZYRTEC) 10 MG tablet Take 10 mg by mouth daily.    . chlorthalidone (HYGROTON) 25 MG tablet 1 table once daily  3  . dicyclomine (BENTYL) 20 MG tablet Take 1 tablet (20 mg total) by mouth 2 (two) times daily. 20 tablet 0  . escitalopram (LEXAPRO) 20 MG tablet Take 20 mg by mouth daily.     Marland Kitchen linagliptin  (TRADJENTA) 5 MG TABS tablet Take by mouth.    . meloxicam (MOBIC) 15 MG tablet Take 15 mg by mouth daily.    . metFORMIN (GLUCOPHAGE) 1000 MG tablet Take 1 tablet (1,000 mg total) by mouth 2 (two) times daily with a meal.    . modafinil (PROVIGIL) 100 MG tablet TAKE 1 TABLET BY MOUTH EVERY DAY 30 tablet 2  . Multiple Vitamins-Minerals (SENTRY SENIOR PO) Take 1 tablet by mouth daily.    . Omega-3 Fatty Acids (FISH OIL) 1000 MG CAPS Take 1 capsule by mouth daily.    Marland Kitchen omeprazole (PRILOSEC) 20 MG capsule Take 20 mg by mouth 2 (two) times daily before a meal.     . ondansetron (ZOFRAN) 4 MG tablet Take 1 tablet (4 mg total) by mouth every 8 (eight) hours as needed for nausea or vomiting. 10 tablet 0  . Vitamin D, Ergocalciferol, (DRISDOL) 50000 units CAPS capsule Take 1 capsule (50,000 Units total) by mouth every 7 (seven) days. 4 capsule 0   No current facility-administered medications on file prior to visit.     PAST MEDICAL HISTORY: Past Medical History:  Diagnosis Date  . Arthritis    "hands, neck, right knee" (11/29/2013)  . Biliary dyskinesia 07/2006  . Cancer, uterine ([place]) 05/2003   F1G01,G1  . Depression   . Diverticulosis    Hx: of  . Gastroparesis 7/08   Dr. Collene Mares  .  GERD (gastroesophageal reflux disease)   . HTN (hypertension) 2005  . Hyperlipidemia   . OSA on CPAP   . Pneumonia 1990's X 1  . Type II diabetes mellitus ([place]) 2005  . Urinary, incontinence, stress female 12/13   using Pessary 01/2012    PAST SURGICAL HISTORY: Past Surgical History:  Procedure Laterality Date  . CARDIAC CATHETERIZATION  11/29/2013  . COLONOSCOPY W/ BIOPSIES  10/04   polyp  . COLONOSCOPY W/ BIOPSIES  02/12/05   benign polyp  . COLONOSCOPY W/ BIOPSIES  10/26/09   1 polyp benign, recheck 5 years  . COLONOSCOPY WITH ESOPHAGOGASTRODUODENOSCOPY (EGD)  09/14/06   2 polyps, diverticula, diffuse gastritis  . JOINT REPLACEMENT    . KNEE ARTHROSCOPY Left 2010  . KNEE ARTHROSCOPY Right 11/2012  .  LAPAROSCOPIC CHOLECYSTECTOMY  10/2006  . LEFT HEART CATHETERIZATION WITH CORONARY ANGIOGRAM N/A 11/29/2013   Procedure: LEFT HEART CATHETERIZATION WITH CORONARY ANGIOGRAM;  Surgeon: Laverda Page, MD;  Location: [facility] CATH LAB;  Service: Cardiovascular;  Laterality: N/A;  . TOTAL ABDOMINAL HYSTERECTOMY  05/2003   w/BSO; endometrial carcinoma F1G01, G1  . TOTAL HIP ARTHROPLASTY Right 09/02/2013   Procedure: RIGHT TOTAL HIP ARTHROPLASTY ANTERIOR APPROACH;  Surgeon: Alta Corning, MD;  Location: [place];  Service: Orthopedics;  Laterality: Right;  . TOTAL KNEE ARTHROPLASTY Left 08/02/2012   Procedure: TOTAL KNEE ARTHROPLASTY;  Surgeon: Alta Corning, MD;  Location: [place];  Service: Orthopedics;  Laterality: Left;  left total knee arthroplasty  . TUBAL LIGATION      SOCIAL HISTORY: Social History   Tobacco Use  . Smoking status: Never Smoker  . Smokeless tobacco: Never Used  Substance Use Topics  . Alcohol use: No  . Drug use: No    FAMILY HISTORY: Family History  Problem Relation Age of Onset  . Heart disease Father 58       CABG  . Lung cancer Father 22       lung  . Hypertension Father   . Diabetes Father   . High Cholesterol Father   . Diabetes type II Father   . Heart disease Sister 48       CABG  . Diabetes Sister 62  . Heart disease Brother 79       MI with stents  . Hypertension Mother   . Diabetes Mother   . High Cholesterol Mother   . Sudden death Mother   . Thyroid disease Mother   . Obesity Mother   . Hypertension Sister        x2   . Diabetes Sister   . Hypertension Brother   . Diabetes Brother   . Cancer Sister        high grade Neuroendocrine carcinoma right axilla  . Pancreatic cancer Other        x 3 - 2 MAunts and 1 P Aunts.   . Lupus Other        Pat Aunt    ROS: Review of Systems  Constitutional: Positive for weight loss.  Neurological: Negative for dizziness.       Negative for lightheadedness  Endo/Heme/Allergies:       Negative for  hypoglycemia    PHYSICAL EXAM: Blood pressure (!) 149/72, pulse (!) 49, temperature 98.2 F (36.8 C), height 5\' 3"  (1.6 m), weight 245 lb (111.1 kg), last menstrual period 11/25/2002, SpO2 96 %. Body mass index is 43.4 kg/m. Physical Exam  Constitutional: She is oriented to person, place, and time. She  appears well-developed and well-nourished.  Cardiovascular: Bradycardia present.  Pulmonary/Chest: Effort normal.  Musculoskeletal: Normal range of motion.  Neurological: She is oriented to person, place, and time.  Skin: Skin is warm and dry.  Psychiatric: She has a normal mood and affect. Her behavior is normal.  Vitals reviewed.   RECENT LABS AND TESTS: BMET    Component Value Date/Time   NA 142 05/24/2017 1604   NA 138 04/29/2017 0935   K 4.0 05/24/2017 1604   CL 103 05/24/2017 1604   CO2 28 05/24/2017 1604   GLUCOSE 69 05/24/2017 1604   BUN 26 (H) 05/24/2017 1604   BUN 28 (H) 04/29/2017 0935   CREATININE 0.96 05/24/2017 1604   CREATININE 1.24 (H) 07/16/2015 1339   CALCIUM 9.9 05/24/2017 1604   GFRNONAA >60 05/24/2017 1604   GFRAA >60 05/24/2017 1604   Lab Results  Component Value Date   HGBA1C 6.9 (H) 04/29/2017   HGBA1C 7.0 (H) 07/16/2015   Lab Results  Component Value Date   INSULIN 36.0 (H) 04/29/2017   CBC    Component Value Date/Time   WBC 8.4 05/24/2017 1604   RBC 4.41 05/24/2017 1604   HGB 13.2 05/24/2017 1604   HGB 13.1 04/29/2017 0935   HGB 11.2 (L) 07/11/2014 1340   HCT 40.5 05/24/2017 1604   HCT 39.4 04/29/2017 0935   PLT 262 05/24/2017 1604   MCV 91.8 05/24/2017 1604   MCV 88 04/29/2017 0935   MCH 29.9 05/24/2017 1604   MCHC 32.6 05/24/2017 1604   RDW 13.7 05/24/2017 1604   RDW 14.2 04/29/2017 0935   LYMPHSABS 1.8 05/24/2017 1604   LYMPHSABS 2.1 04/29/2017 0935   MONOABS 0.7 05/24/2017 1604   EOSABS 0.1 05/24/2017 1604   EOSABS 0.2 04/29/2017 0935   BASOSABS 0.0 05/24/2017 1604   BASOSABS 0.0 04/29/2017 0935   Iron/TIBC/Ferritin/  %Sat No results found for: IRON, TIBC, FERRITIN, IRONPCTSAT Lipid Panel     Component Value Date/Time   CHOL 223 (H) 04/29/2017 0935   TRIG 420 (H) 04/29/2017 0935   HDL 36 (L) 04/29/2017 0935   CHOLHDL 5.9 (H) 07/16/2015 1339   VLDL NOT CALC 07/16/2015 1339   LDLCALC Comment 04/29/2017 0935   Hepatic Function Panel     Component Value Date/Time   PROT 7.8 05/24/2017 1604   PROT 7.3 04/29/2017 0935   ALBUMIN 4.1 05/24/2017 1604   ALBUMIN 4.4 04/29/2017 0935   AST 23 05/24/2017 1604   ALT 31 05/24/2017 1604   ALKPHOS 63 05/24/2017 1604   BILITOT 0.5 05/24/2017 1604   BILITOT 0.3 04/29/2017 0935      Component Value Date/Time   TSH 3.010 04/29/2017 0935   TSH 3.96 07/16/2015 1339   Results for CLARAMAE, RIGDON (MRN [id number]) as of 06/24/2017 09:22  Ref. Range 04/29/2017 09:35  Vitamin D, 25-Hydroxy Latest Ref Range: 30.0 - 100.0 ng/mL 29.7 (L)   ASSESSMENT AND PLAN: Bradycardia  Type 2 diabetes mellitus with hypoglycemia without coma, without long-term current use of insulin (HCC)  Class 3 severe obesity with serious comorbidity and body mass index (BMI) of 40.0 to 44.9 in adult, unspecified obesity type ([place])  PLAN:  Bradycardia [name] [name] is to call her doctor and discuss her medications and possible decrease in new medication. [name] [name] agrees to follow up with our clinic in 2 weeks.  Diabetes II [name] [name] has been given extensive diabetes education by myself today including ideal fasting and post-prandial blood glucose readings, individual ideal Hgb A1c goals and hypoglycemia prevention.  We discussed the importance of good blood sugar control to decrease the likelihood of diabetic complications such as nephropathy, neuropathy, limb loss, blindness, coronary artery disease, and death. We discussed the importance of intensive lifestyle modification including diet, exercise and weight loss as the first line treatment for diabetes. [name] [name] agrees to bring her blood sugar log  to the next visit. [name] [name] agrees to continue her diabetes medications and will follow up at the agreed upon time.  We spent > than 50% of the 15 minute visit on the counseling as documented in the note.  Obesity [name] [name] is currently in the action stage of change. As such, her goal is to continue with weight loss efforts She has agreed to follow the Category 2 plan [name] [name] has been instructed to work up to a goal of 150 minutes of combined cardio and strengthening exercise per week for weight loss and overall health benefits. We discussed the following Behavioral Modification Strategies today: better snacking choices, planning for success, increasing lean protein intake and work on meal planning and easy cooking plans  [name] [name] has agreed to follow up with our clinic in 2 weeks. She was informed of the importance of frequent follow up visits to maximize her success with intensive lifestyle modifications for her multiple health conditions.   OBESITY BEHAVIORAL INTERVENTION VISIT  Today's visit was # 4 out of 22.  Starting weight: 248 lbs Starting date: 04/29/17 Today's weight : 245 lbs  Today's date: 06/23/2017 Total lbs lost to date: 3 (Patients must lose 7 lbs in the first 6 months to continue with counseling)   ASK: We discussed the diagnosis of obesity with [name] [name] today and [name] [name] agreed to give [name] permission to discuss obesity behavioral modification therapy today.  ASSESS: [name] [name] has the diagnosis of obesity and her BMI today is 43.41 [name] [name] is in the action stage of change   ADVISE: [name] [name] was educated on the multiple health risks of obesity as well as the benefit of weight loss to improve her health. She was advised of the need for long term treatment and the importance of lifestyle modifications.  AGREE: Multiple dietary modification options and treatment options were discussed and  [name] [name] agreed to the above obesity treatment plan.  I, Doreene Nest, am acting as transcriptionist for Eber Jones, MD  I have reviewed the above documentation for accuracy and completeness, and I agree with the above. - Ilene Qua, MD

## 2017-06-30 DIAGNOSIS — M25562 Pain in left knee: Secondary | ICD-10-CM | POA: Diagnosis not present

## 2017-06-30 DIAGNOSIS — G5601 Carpal tunnel syndrome, right upper limb: Secondary | ICD-10-CM | POA: Diagnosis not present

## 2017-06-30 DIAGNOSIS — M25531 Pain in right wrist: Secondary | ICD-10-CM | POA: Diagnosis not present

## 2017-07-01 ENCOUNTER — Other Ambulatory Visit (HOSPITAL_COMMUNITY): Payer: Self-pay | Admitting: Orthopedic Surgery

## 2017-07-01 DIAGNOSIS — M25562 Pain in left knee: Secondary | ICD-10-CM

## 2017-07-03 DIAGNOSIS — R200 Anesthesia of skin: Secondary | ICD-10-CM | POA: Diagnosis not present

## 2017-07-08 ENCOUNTER — Ambulatory Visit (INDEPENDENT_AMBULATORY_CARE_PROVIDER_SITE_OTHER): Payer: Medicare Other | Admitting: Family Medicine

## 2017-07-08 VITALS — BP 126/70 | HR 52 | Temp 98.2°F | Ht 63.0 in | Wt 241.0 lb

## 2017-07-08 DIAGNOSIS — E559 Vitamin D deficiency, unspecified: Secondary | ICD-10-CM

## 2017-07-08 DIAGNOSIS — E11649 Type 2 diabetes mellitus with hypoglycemia without coma: Secondary | ICD-10-CM

## 2017-07-08 DIAGNOSIS — Z6841 Body Mass Index (BMI) 40.0 and over, adult: Secondary | ICD-10-CM | POA: Diagnosis not present

## 2017-07-08 DIAGNOSIS — G5601 Carpal tunnel syndrome, right upper limb: Secondary | ICD-10-CM | POA: Diagnosis not present

## 2017-07-09 MED ORDER — VITAMIN D (ERGOCALCIFEROL) 1.25 MG (50000 UNIT) PO CAPS: 50000.0000 [IU] | ORAL_CAPSULE | ORAL | 0 refills | Status: DC

## 2017-07-09 NOTE — Progress Notes (Signed)
Office: [phone]  /  Fax: [phone]   HPI:   Chief Complaint: OBESITY [name] [name] is here to discuss her progress with her obesity treatment plan. She is on the Category 2 plan and is following her eating plan approximately 85 % of the time. She states she is walking for 90 minutes 5 times per week. [name] [name] has an occasional craving after dinner for an apple. She is using popcorn and peanut butter on a slice of bread. Hunger is controlled. Her weight is 241 lb (109.3 kg) today and has had a weight loss of 4 pounds over a period of 2 weeks since her last visit. She has lost 7 lbs since starting treatment with [name].  Vitamin D deficiency [name] [name] has a diagnosis of vitamin D deficiency. [name] [name] is currently taking vit D. Fatigue is improving and she denies nausea, vomiting or muscle weakness.  Diabetes II [name] [name] has a diagnosis of diabetes type II. [name] states fasting BGs range between 95 and 136, post prandial BGs range between 86 and 212. [name] only had one low of 75 and she hadn't eaten. Last A1c was at 6.9 She has been working on intensive lifestyle modifications including diet, exercise, and weight loss to help control her blood glucose levels.  ALLERGIES: Allergies  Allergen Reactions   Codeine Palpitations    MEDICATIONS: Current Outpatient Medications on File Prior to Visit  Medication Sig Dispense Refill   amLODipine (NORVASC) 10 MG tablet Take 1 tablet (10 mg total) by mouth daily. 30 tablet 2   atorvastatin (LIPITOR) 80 MG tablet Take 1 tablet by mouth daily.  5   benazepril (LOTENSIN) 40 MG tablet Take 40 mg by mouth daily.     bisoprolol (ZEBETA) 10 MG tablet Take 10 mg by mouth daily.     cetirizine (ZYRTEC) 10 MG tablet Take 10 mg by mouth daily.     chlorthalidone (HYGROTON) 25 MG tablet 1 table once daily  3   dicyclomine (BENTYL) 20 MG tablet Take 1 tablet (20 mg total) by mouth 2 (two) times daily. 20 tablet 0   escitalopram (LEXAPRO) 20 MG  tablet Take 20 mg by mouth daily.      linagliptin (TRADJENTA) 5 MG TABS tablet Take by mouth.     meloxicam (MOBIC) 15 MG tablet Take 15 mg by mouth daily.     metFORMIN (GLUCOPHAGE) 1000 MG tablet Take 1 tablet (1,000 mg total) by mouth 2 (two) times daily with a meal.     modafinil (PROVIGIL) 100 MG tablet TAKE 1 TABLET BY MOUTH EVERY DAY 30 tablet 2   Multiple Vitamins-Minerals (SENTRY SENIOR PO) Take 1 tablet by mouth daily.     Omega-3 Fatty Acids (FISH OIL) 1000 MG CAPS Take 1 capsule by mouth daily.     omeprazole (PRILOSEC) 20 MG capsule Take 20 mg by mouth 2 (two) times daily before a meal.      ondansetron (ZOFRAN) 4 MG tablet Take 1 tablet (4 mg total) by mouth every 8 (eight) hours as needed for nausea or vomiting. 10 tablet 0   Vitamin D, Ergocalciferol, (DRISDOL) 50000 units CAPS capsule Take 1 capsule (50,000 Units total) by mouth every 7 (seven) days. 4 capsule 0   No current facility-administered medications on file prior to visit.     PAST MEDICAL HISTORY: Past Medical History:  Diagnosis Date   Arthritis    "hands, neck, right knee" (11/29/2013)   Biliary dyskinesia 07/2006   Cancer, uterine ([place]) 05/2003   F1G01,G1  Depression    Diverticulosis    Hx: of   Gastroparesis 7/08   Dr. Collene Mares   GERD (gastroesophageal reflux disease)    HTN (hypertension) 2005   Hyperlipidemia    OSA on CPAP    Pneumonia 1990's X 1   Type II diabetes mellitus ([place]) 2005   Urinary, incontinence, stress female 12/13   using Pessary 01/2012    PAST SURGICAL HISTORY: Past Surgical History:  Procedure Laterality Date   CARDIAC CATHETERIZATION  11/29/2013   COLONOSCOPY W/ BIOPSIES  10/04   polyp   COLONOSCOPY W/ BIOPSIES  02/12/05   benign polyp   COLONOSCOPY W/ BIOPSIES  10/26/09   1 polyp benign, recheck 5 years   COLONOSCOPY WITH ESOPHAGOGASTRODUODENOSCOPY (EGD)  09/14/06   2 polyps, diverticula, diffuse gastritis   JOINT REPLACEMENT     KNEE  ARTHROSCOPY Left 2010   KNEE ARTHROSCOPY Right 11/2012   LAPAROSCOPIC CHOLECYSTECTOMY  10/2006   LEFT HEART CATHETERIZATION WITH CORONARY ANGIOGRAM N/A 11/29/2013   Procedure: LEFT HEART CATHETERIZATION WITH CORONARY ANGIOGRAM;  Surgeon: Laverda Page, MD;  Location: [facility] CATH LAB;  Service: Cardiovascular;  Laterality: N/A;   TOTAL ABDOMINAL HYSTERECTOMY  05/2003   w/BSO; endometrial carcinoma F1G01, G1   TOTAL HIP ARTHROPLASTY Right 09/02/2013   Procedure: RIGHT TOTAL HIP ARTHROPLASTY ANTERIOR APPROACH;  Surgeon: Alta Corning, MD;  Location: [place];  Service: Orthopedics;  Laterality: Right;   TOTAL KNEE ARTHROPLASTY Left 08/02/2012   Procedure: TOTAL KNEE ARTHROPLASTY;  Surgeon: Alta Corning, MD;  Location: [place];  Service: Orthopedics;  Laterality: Left;  left total knee arthroplasty   TUBAL LIGATION      SOCIAL HISTORY: Social History   Tobacco Use   Smoking status: Never Smoker   Smokeless tobacco: Never Used  Substance Use Topics   Alcohol use: No   Drug use: No    FAMILY HISTORY: Family History  Problem Relation Age of Onset   Heart disease Father 28       CABG   Lung cancer Father 39       lung   Hypertension Father    Diabetes Father    High Cholesterol Father    Diabetes type II Father    Heart disease Sister 44       CABG   Diabetes Sister 74   Heart disease Brother 35       MI with stents   Hypertension Mother    Diabetes Mother    High Cholesterol Mother    Sudden death Mother    Thyroid disease Mother    Obesity Mother    Hypertension Sister        x2    Diabetes Sister    Hypertension Brother    Diabetes Brother    Cancer Sister        high grade Neuroendocrine carcinoma right axilla   Pancreatic cancer Other        x 3 - 2 MAunts and 1 P Aunts.    Lupus Other        Pat Aunt    ROS: Review of Systems  Constitutional: Positive for malaise/fatigue and weight loss.  Gastrointestinal: Negative for nausea and  vomiting.  Musculoskeletal:       Negative for muscle weakness  Endo/Heme/Allergies:       Positive for hypoglycemia Positive for hyperglycemia    PHYSICAL EXAM: Blood pressure 126/70, pulse (!) 52, temperature 98.2 F (36.8 C), temperature source Oral, height 5\' 3"  (1.6  m), weight 241 lb (109.3 kg), last menstrual period 11/25/2002, SpO2 95 %. Body mass index is 42.69 kg/m. Physical Exam  Constitutional: She is oriented to person, place, and time. She appears well-developed and well-nourished.  Cardiovascular: Normal rate.  Pulmonary/Chest: Effort normal.  Musculoskeletal: Normal range of motion.  Neurological: She is oriented to person, place, and time.  Skin: Skin is warm and dry.  Psychiatric: She has a normal mood and affect. Her behavior is normal.  Vitals reviewed.   RECENT LABS AND TESTS: BMET    Component Value Date/Time   NA 142 05/24/2017 1604   NA 138 04/29/2017 0935   K 4.0 05/24/2017 1604   CL 103 05/24/2017 1604   CO2 28 05/24/2017 1604   GLUCOSE 69 05/24/2017 1604   BUN 26 (H) 05/24/2017 1604   BUN 28 (H) 04/29/2017 0935   CREATININE 0.96 05/24/2017 1604   CREATININE 1.24 (H) 07/16/2015 1339   CALCIUM 9.9 05/24/2017 1604   GFRNONAA >60 05/24/2017 1604   GFRAA >60 05/24/2017 1604   Lab Results  Component Value Date   HGBA1C 6.9 (H) 04/29/2017   HGBA1C 7.0 (H) 07/16/2015   Lab Results  Component Value Date   INSULIN 36.0 (H) 04/29/2017   CBC    Component Value Date/Time   WBC 8.4 05/24/2017 1604   RBC 4.41 05/24/2017 1604   HGB 13.2 05/24/2017 1604   HGB 13.1 04/29/2017 0935   HGB 11.2 (L) 07/11/2014 1340   HCT 40.5 05/24/2017 1604   HCT 39.4 04/29/2017 0935   PLT 262 05/24/2017 1604   MCV 91.8 05/24/2017 1604   MCV 88 04/29/2017 0935   MCH 29.9 05/24/2017 1604   MCHC 32.6 05/24/2017 1604   RDW 13.7 05/24/2017 1604   RDW 14.2 04/29/2017 0935   LYMPHSABS 1.8 05/24/2017 1604   LYMPHSABS 2.1 04/29/2017 0935   MONOABS 0.7 05/24/2017 1604    EOSABS 0.1 05/24/2017 1604   EOSABS 0.2 04/29/2017 0935   BASOSABS 0.0 05/24/2017 1604   BASOSABS 0.0 04/29/2017 0935   Iron/TIBC/Ferritin/ %Sat No results found for: IRON, TIBC, FERRITIN, IRONPCTSAT Lipid Panel     Component Value Date/Time   CHOL 223 (H) 04/29/2017 0935   TRIG 420 (H) 04/29/2017 0935   HDL 36 (L) 04/29/2017 0935   CHOLHDL 5.9 (H) 07/16/2015 1339   VLDL NOT CALC 07/16/2015 1339   LDLCALC Comment 04/29/2017 0935   Hepatic Function Panel     Component Value Date/Time   PROT 7.8 05/24/2017 1604   PROT 7.3 04/29/2017 0935   ALBUMIN 4.1 05/24/2017 1604   ALBUMIN 4.4 04/29/2017 0935   AST 23 05/24/2017 1604   ALT 31 05/24/2017 1604   ALKPHOS 63 05/24/2017 1604   BILITOT 0.5 05/24/2017 1604   BILITOT 0.3 04/29/2017 0935      Component Value Date/Time   TSH 3.010 04/29/2017 0935   TSH 3.96 07/16/2015 1339   Results for ALAZNE, QUANT (MRN [id number]) as of 07/09/2017 07:34  Ref. Range 04/29/2017 09:35  Vitamin D, 25-Hydroxy Latest Ref Range: 30.0 - 100.0 ng/mL 29.7 (L)   ASSESSMENT AND PLAN: Type 2 diabetes mellitus with hypoglycemia without coma, without long-term current use of insulin (HCC)  Vitamin D deficiency - Plan: Vitamin D, Ergocalciferol, (DRISDOL) 50000 units CAPS capsule  Class 3 severe obesity with serious comorbidity and body mass index (BMI) of 40.0 to 44.9 in adult, unspecified obesity type ([place])  PLAN:  Vitamin D Deficiency Buelah was informed that low vitamin D levels contributes to  fatigue and are associated with obesity, breast, and colon cancer. She agrees to continue to take prescription Vit D @50 ,000 IU every week #4 with no refills and will follow up for routine testing of vitamin D, at least 2-3 times per year. She was informed of the risk of over-replacement of vitamin D and agrees to not increase her dose unless she discusses this with [name] first. Nelsy agrees to follow up as directed.  Diabetes II Zandra has been given  extensive diabetes education by myself today including ideal fasting and post-prandial blood glucose readings, individual ideal Hgb A1c goals and hypoglycemia prevention. We discussed the importance of good blood sugar control to decrease the likelihood of diabetic complications such as nephropathy, neuropathy, limb loss, blindness, coronary artery disease, and death. We discussed the importance of intensive lifestyle modification including diet, exercise and weight loss as the first line treatment for diabetes. Keyah agrees to continue metformin and tradjenta as prescribed  and follow up at the agreed upon time.  Obesity Meygan is currently in the action stage of change. As such, her goal is to continue with weight loss efforts She has agreed to follow the Category 2 plan Cerenity has been instructed to work up to a goal of 150 minutes of combined cardio and strengthening exercise per week for weight loss and overall health benefits. We discussed the following Behavioral Modification Strategies today: better snacking choices, planning for success, increasing lean protein intake, decreasing simple carbohydrates  and work on meal planning and easy cooking plans  Rielle has agreed to follow up with our clinic in 2 weeks. She was informed of the importance of frequent follow up visits to maximize her success with intensive lifestyle modifications for her multiple health conditions.   OBESITY BEHAVIORAL INTERVENTION VISIT  Today's visit was # 5 out of 22.  Starting weight: 248 lbs Starting date: 04/29/17 Today's weight : 241 lbs  Today's date: 07/08/2017 Total lbs lost to date: 7 (Patients must lose 7 lbs in the first 6 months to continue with counseling)   ASK: We discussed the diagnosis of obesity with [name] today and Jeri agreed to give [name] permission to discuss obesity behavioral modification therapy today.  ASSESS: Pricila has the diagnosis of obesity and her BMI today  is 42.7 Letty is in the action stage of change   ADVISE: Tulsi was educated on the multiple health risks of obesity as well as the benefit of weight loss to improve her health. She was advised of the need for long term treatment and the importance of lifestyle modifications.  AGREE: Multiple dietary modification options and treatment options were discussed and  Imogine agreed to the above obesity treatment plan.  I, Doreene Nest, am acting as transcriptionist for Eber Jones, MD  I have reviewed the above documentation for accuracy and completeness, and I agree with the above. - Ilene Qua, MD

## 2017-07-13 ENCOUNTER — Ambulatory Visit (HOSPITAL_COMMUNITY)
Admission: RE | Admit: 2017-07-13 | Discharge: 2017-07-13 | Disposition: A | Discharge status: Home / Self Care | Payer: Medicare Other | Source: Ambulatory Visit | Attending: Orthopedic Surgery | Admitting: Orthopedic Surgery

## 2017-07-13 ENCOUNTER — Encounter (HOSPITAL_COMMUNITY)
Admission: RE | Admit: 2017-07-13 | Discharge: 2017-07-13 | Disposition: A | Discharge status: Home / Self Care | Payer: Medicare Other | Source: Ambulatory Visit | Attending: Orthopedic Surgery | Admitting: Orthopedic Surgery

## 2017-07-13 DIAGNOSIS — M2391 Unspecified internal derangement of right knee: Secondary | ICD-10-CM | POA: Diagnosis not present

## 2017-07-13 DIAGNOSIS — M25562 Pain in left knee: Secondary | ICD-10-CM | POA: Insufficient documentation

## 2017-07-13 DIAGNOSIS — Z471 Aftercare following joint replacement surgery: Secondary | ICD-10-CM | POA: Diagnosis not present

## 2017-07-13 DIAGNOSIS — Z96652 Presence of left artificial knee joint: Secondary | ICD-10-CM | POA: Diagnosis not present

## 2017-07-13 MED ORDER — TECHNETIUM TC 99M MEDRONATE IV KIT
21.1000 | PACK | Freq: Once | INTRAVENOUS | Status: AC | PRN
  Administered 2017-07-13: 21.1 via INTRAVENOUS

## 2017-07-16 ENCOUNTER — Encounter (HOSPITAL_BASED_OUTPATIENT_CLINIC_OR_DEPARTMENT_OTHER): Payer: Self-pay | Admitting: *Deleted

## 2017-07-16 ENCOUNTER — Other Ambulatory Visit: Payer: Self-pay

## 2017-07-16 ENCOUNTER — Other Ambulatory Visit: Payer: Self-pay | Admitting: Orthopedic Surgery

## 2017-07-16 DIAGNOSIS — G5601 Carpal tunnel syndrome, right upper limb: Secondary | ICD-10-CM | POA: Diagnosis not present

## 2017-07-16 DIAGNOSIS — M25562 Pain in left knee: Secondary | ICD-10-CM | POA: Diagnosis not present

## 2017-07-16 NOTE — Progress Notes (Signed)
Spoke w/ pt via phone for pre-op interview.  Npo after mn w/ exception clear liquids until 0600 (no cream/ milk products).  Arrive at 1000.  Needs istat 8.  Current ekg in chart and epic.  Will take prilosec, bisoprolol, and norvasc am dos w/ sips of water.

## 2017-07-17 ENCOUNTER — Ambulatory Visit (HOSPITAL_BASED_OUTPATIENT_CLINIC_OR_DEPARTMENT_OTHER)
Admission: RE | Admit: 2017-07-17 | Discharge: 2017-07-17 | Disposition: A | Discharge status: Home / Self Care | Payer: Medicare Other | Source: Ambulatory Visit | Attending: Orthopedic Surgery | Admitting: Orthopedic Surgery

## 2017-07-17 ENCOUNTER — Encounter (HOSPITAL_BASED_OUTPATIENT_CLINIC_OR_DEPARTMENT_OTHER)
Admission: RE | Disposition: A | Discharge status: Home / Self Care | Payer: Self-pay | Source: Ambulatory Visit | Attending: Orthopedic Surgery

## 2017-07-17 ENCOUNTER — Ambulatory Visit (HOSPITAL_BASED_OUTPATIENT_CLINIC_OR_DEPARTMENT_OTHER): Payer: Medicare Other | Admitting: Anesthesiology

## 2017-07-17 ENCOUNTER — Encounter (HOSPITAL_BASED_OUTPATIENT_CLINIC_OR_DEPARTMENT_OTHER): Payer: Self-pay | Admitting: *Deleted

## 2017-07-17 ENCOUNTER — Other Ambulatory Visit: Payer: Self-pay

## 2017-07-17 DIAGNOSIS — Z96652 Presence of left artificial knee joint: Secondary | ICD-10-CM | POA: Diagnosis not present

## 2017-07-17 DIAGNOSIS — Z8542 Personal history of malignant neoplasm of other parts of uterus: Secondary | ICD-10-CM | POA: Diagnosis not present

## 2017-07-17 DIAGNOSIS — G5601 Carpal tunnel syndrome, right upper limb: Secondary | ICD-10-CM | POA: Diagnosis not present

## 2017-07-17 DIAGNOSIS — E119 Type 2 diabetes mellitus without complications: Secondary | ICD-10-CM | POA: Insufficient documentation

## 2017-07-17 DIAGNOSIS — K219 Gastro-esophageal reflux disease without esophagitis: Secondary | ICD-10-CM | POA: Diagnosis not present

## 2017-07-17 DIAGNOSIS — Z96641 Presence of right artificial hip joint: Secondary | ICD-10-CM | POA: Insufficient documentation

## 2017-07-17 DIAGNOSIS — Z7984 Long term (current) use of oral hypoglycemic drugs: Secondary | ICD-10-CM | POA: Insufficient documentation

## 2017-07-17 DIAGNOSIS — E785 Hyperlipidemia, unspecified: Secondary | ICD-10-CM | POA: Diagnosis not present

## 2017-07-17 DIAGNOSIS — G4733 Obstructive sleep apnea (adult) (pediatric): Secondary | ICD-10-CM | POA: Diagnosis not present

## 2017-07-17 DIAGNOSIS — E559 Vitamin D deficiency, unspecified: Secondary | ICD-10-CM | POA: Diagnosis not present

## 2017-07-17 DIAGNOSIS — I10 Essential (primary) hypertension: Secondary | ICD-10-CM | POA: Insufficient documentation

## 2017-07-17 DIAGNOSIS — F329 Major depressive disorder, single episode, unspecified: Secondary | ICD-10-CM | POA: Insufficient documentation

## 2017-07-17 DIAGNOSIS — I2510 Atherosclerotic heart disease of native coronary artery without angina pectoris: Secondary | ICD-10-CM | POA: Insufficient documentation

## 2017-07-17 DIAGNOSIS — Z79899 Other long term (current) drug therapy: Secondary | ICD-10-CM | POA: Diagnosis not present

## 2017-07-17 DIAGNOSIS — Z885 Allergy status to narcotic agent status: Secondary | ICD-10-CM | POA: Insufficient documentation

## 2017-07-17 HISTORY — DX: Carpal tunnel syndrome, right upper limb: G56.01

## 2017-07-17 HISTORY — DX: Unspecified urinary incontinence: R32

## 2017-07-17 HISTORY — DX: Atherosclerotic heart disease of native coronary artery without angina pectoris: I25.10

## 2017-07-17 HISTORY — PX: CARPAL TUNNEL RELEASE: SHX101

## 2017-07-17 HISTORY — DX: Unspecified osteoarthritis, unspecified site: M19.90

## 2017-07-17 HISTORY — DX: Vitamin D deficiency, unspecified: E55.9

## 2017-07-17 HISTORY — DX: Personal history of malignant neoplasm of other parts of uterus: Z85.42

## 2017-07-17 LAB — GLUCOSE, CAPILLARY: GLUCOSE-CAPILLARY: 124 mg/dL — AB (ref 65–99)

## 2017-07-17 LAB — POCT I-STAT, CHEM 8
BUN: 27 mg/dL — AB (ref 6–20)
CALCIUM ION: 1.21 mmol/L (ref 1.15–1.40)
CHLORIDE: 101 mmol/L (ref 101–111)
Creatinine, Ser: 0.8 mg/dL (ref 0.44–1.00)
GLUCOSE: 112 mg/dL — AB (ref 65–99)
HCT: 38 % (ref 36.0–46.0)
Hemoglobin: 12.9 g/dL (ref 12.0–15.0)
POTASSIUM: 4.1 mmol/L (ref 3.5–5.1)
SODIUM: 141 mmol/L (ref 135–145)
TCO2: 28 mmol/L (ref 22–32)

## 2017-07-17 SURGERY — CARPAL TUNNEL RELEASE
Anesthesia: Monitor Anesthesia Care | Laterality: Right

## 2017-07-17 MED ORDER — LIDOCAINE HCL (PF) 0.5 % IJ SOLN
INTRAMUSCULAR | Status: DC | PRN
  Administered 2017-07-17: 25 mL via INTRAVENOUS

## 2017-07-17 MED ORDER — CEFAZOLIN SODIUM-DEXTROSE 2-4 GM/100ML-% IV SOLN
2.0000 g | INTRAVENOUS | Status: AC
  Administered 2017-07-17: 2 g via INTRAVENOUS
  Filled 2017-07-17: qty 100

## 2017-07-17 MED ORDER — ONDANSETRON HCL 4 MG/2ML IJ SOLN
INTRAMUSCULAR | Status: AC
  Filled 2017-07-17: qty 2

## 2017-07-17 MED ORDER — ONDANSETRON HCL 4 MG/2ML IJ SOLN
INTRAMUSCULAR | Status: DC | PRN
  Administered 2017-07-17: 4 mg via INTRAVENOUS

## 2017-07-17 MED ORDER — HYDROCODONE-ACETAMINOPHEN 5-325 MG PO TABS: 1.0000 | ORAL_TABLET | Freq: Four times a day (QID) | ORAL | 0 refills | Status: DC | PRN

## 2017-07-17 MED ORDER — BUPIVACAINE HCL (PF) 0.5 % IJ SOLN
INTRAMUSCULAR | Status: DC | PRN
  Administered 2017-07-17: 6 mL

## 2017-07-17 MED ORDER — MIDAZOLAM HCL 2 MG/2ML IJ SOLN
INTRAMUSCULAR | Status: AC
  Filled 2017-07-17: qty 2

## 2017-07-17 MED ORDER — CHLORHEXIDINE GLUCONATE 4 % EX LIQD
60.0000 mL | Freq: Once | CUTANEOUS | Status: DC
  Filled 2017-07-17: qty 118

## 2017-07-17 MED ORDER — LACTATED RINGERS IV SOLN
INTRAVENOUS | Status: DC
  Administered 2017-07-17: 11:00:00 via INTRAVENOUS
  Filled 2017-07-17: qty 1000

## 2017-07-17 MED ORDER — PROPOFOL 500 MG/50ML IV EMUL
INTRAVENOUS | Status: DC | PRN
  Administered 2017-07-17: 50 ug/kg/min via INTRAVENOUS

## 2017-07-17 MED ORDER — FENTANYL CITRATE (PF) 100 MCG/2ML IJ SOLN
INTRAMUSCULAR | Status: AC
  Filled 2017-07-17: qty 2

## 2017-07-17 MED ORDER — FENTANYL CITRATE (PF) 100 MCG/2ML IJ SOLN
INTRAMUSCULAR | Status: DC | PRN
  Administered 2017-07-17 (×2): 50 ug via INTRAVENOUS

## 2017-07-17 MED ORDER — CEFAZOLIN SODIUM-DEXTROSE 2-4 GM/100ML-% IV SOLN
INTRAVENOUS | Status: AC
  Filled 2017-07-17: qty 100

## 2017-07-17 MED ORDER — FENTANYL CITRATE (PF) 100 MCG/2ML IJ SOLN
25.0000 ug | INTRAMUSCULAR | Status: DC | PRN
  Filled 2017-07-17: qty 1

## 2017-07-17 MED ORDER — MIDAZOLAM HCL 5 MG/5ML IJ SOLN
INTRAMUSCULAR | Status: DC | PRN
  Administered 2017-07-17: 2 mg via INTRAVENOUS

## 2017-07-17 SURGICAL SUPPLY — 46 items
BANDAGE ACE 3X5.8 VEL STRL LF (GAUZE/BANDAGES/DRESSINGS) ×2 IMPLANT
BANDAGE ACE 4X5 VEL STRL LF (GAUZE/BANDAGES/DRESSINGS) ×2 IMPLANT
BLADE SURG 15 STRL LF DISP TIS (BLADE) ×1 IMPLANT
BLADE SURG 15 STRL SS (BLADE) ×1
BNDG CONFORM 3 STRL LF (GAUZE/BANDAGES/DRESSINGS) ×2 IMPLANT
BNDG ESMARK 4X9 LF (GAUZE/BANDAGES/DRESSINGS) IMPLANT
COVER BACK TABLE 60X90IN (DRAPES) ×2 IMPLANT
COVER MAYO STAND STRL (DRAPES) ×2 IMPLANT
CUFF TOURNIQUET SINGLE 18IN (TOURNIQUET CUFF) IMPLANT
DECANTER SPIKE VIAL GLASS SM (MISCELLANEOUS) IMPLANT
DRAPE EXTREMITY T 121X128X90 (DRAPE) ×2 IMPLANT
DRAPE SURG 17X23 STRL (DRAPES) ×2 IMPLANT
DRSG EMULSION OIL 3X3 NADH (GAUZE/BANDAGES/DRESSINGS) IMPLANT
DURAPREP 26ML APPLICATOR (WOUND CARE) ×2 IMPLANT
ELECT NEEDLE TIP 2.8 STRL (NEEDLE) IMPLANT
ELECT REM PT RETURN 9FT ADLT (ELECTROSURGICAL)
ELECTRODE REM PT RTRN 9FT ADLT (ELECTROSURGICAL) IMPLANT
GAUZE SPONGE 4X4 12PLY STRL (GAUZE/BANDAGES/DRESSINGS) ×2 IMPLANT
GAUZE SPONGE 4X4 12PLY STRL LF (GAUZE/BANDAGES/DRESSINGS) ×2 IMPLANT
GAUZE SPONGE 4X4 16PLY XRAY LF (GAUZE/BANDAGES/DRESSINGS) ×2 IMPLANT
GLOVE BIOGEL PI IND STRL 8 (GLOVE) ×2 IMPLANT
GLOVE BIOGEL PI INDICATOR 8 (GLOVE) ×2
GLOVE ECLIPSE 7.5 STRL STRAW (GLOVE) ×4 IMPLANT
GOWN STRL REUS W/ TWL LRG LVL3 (GOWN DISPOSABLE) ×1 IMPLANT
GOWN STRL REUS W/ TWL XL LVL3 (GOWN DISPOSABLE) ×1 IMPLANT
GOWN STRL REUS W/TWL LRG LVL3 (GOWN DISPOSABLE) ×1
GOWN STRL REUS W/TWL XL LVL3 (GOWN DISPOSABLE) ×3 IMPLANT
NEEDLE HYPO 25X1 1.5 SAFETY (NEEDLE) IMPLANT
NS IRRIG 1000ML POUR BTL (IV SOLUTION) ×2 IMPLANT
PACK BASIN DAY SURGERY FS (CUSTOM PROCEDURE TRAY) ×2 IMPLANT
PAD CAST 3X4 CTTN HI CHSV (CAST SUPPLIES) ×1 IMPLANT
PAD CAST 4YDX4 CTTN HI CHSV (CAST SUPPLIES) ×1 IMPLANT
PADDING CAST ABS 4INX4YD NS (CAST SUPPLIES)
PADDING CAST ABS COTTON 4X4 ST (CAST SUPPLIES) IMPLANT
PADDING CAST COTTON 3X4 STRL (CAST SUPPLIES) ×1
PADDING CAST COTTON 4X4 STRL (CAST SUPPLIES) ×1
PENCIL BUTTON HOLSTER BLD 10FT (ELECTRODE) IMPLANT
SPLINT PLASTER CAST XFAST 3X15 (CAST SUPPLIES) ×5 IMPLANT
SPLINT PLASTER XTRA FASTSET 3X (CAST SUPPLIES) ×5
STOCKINETTE 4X48 STRL (DRAPES) ×2 IMPLANT
SUT ETHILON 4 0 PS 2 18 (SUTURE) ×2 IMPLANT
SYR BULB 3OZ (MISCELLANEOUS) ×2 IMPLANT
SYR CONTROL 10ML LL (SYRINGE) IMPLANT
TOWEL OR 17X24 6PK STRL BLUE (TOWEL DISPOSABLE) ×2 IMPLANT
TOWEL OR NON WOVEN STRL DISP B (DISPOSABLE) ×2 IMPLANT
UNDERPAD 30X30 (UNDERPADS AND DIAPERS) ×2 IMPLANT

## 2017-07-17 NOTE — Anesthesia Postprocedure Evaluation (Signed)
Anesthesia Post Note  Patient: [name] [name]  Procedure(s) Performed: CARPAL TUNNEL RELEASE (Right )     Patient location during evaluation: PACU Anesthesia Type: MAC Level of consciousness: awake Pain management: pain level controlled Vital Signs Assessment: post-procedure vital signs reviewed and stable Respiratory status: spontaneous breathing Cardiovascular status: stable Anesthetic complications: no    Last Vitals:  Vitals:   07/17/17 0942  BP: (!) 150/70  Pulse: (!) 53  Resp: 18  Temp: 36.7 C  SpO2: 96%    Last Pain:  Vitals:   07/17/17 0942  TempSrc: Oral                 Alejo Beamer

## 2017-07-17 NOTE — Discharge Instructions (Signed)
Elevate and ice your right hand as much as possible for a few days. Keep the splint and dressing clean and dry. You may move your fingers as tolerated. Take pain medication as needed.  Regional Anesthesia Blocks  1. Numbness or the inability to move the "blocked" extremity may last from 3-48 hours after placement. The length of time depends on the medication injected and your individual response to the medication. If the numbness is not going away after 48 hours, call your surgeon.  2. The extremity that is blocked will need to be protected until the numbness is gone and the  Strength has returned. Because you cannot feel it, you will need to take extra care to avoid injury. Because it may be weak, you may have difficulty moving it or using it. You may not know what position it is in without looking at it while the block is in effect.  3. For blocks in the legs and feet, returning to weight bearing and walking needs to be done carefully. You will need to wait until the numbness is entirely gone and the strength has returned. You should be able to move your leg and foot normally before you try and bear weight or walk. You will need someone to be with you when you first try to ensure you do not fall and possibly risk injury.  4. Bruising and tenderness at the needle site are common side effects and will resolve in a few days.  5. Persistent numbness or new problems with movement should be communicated to the surgeon or the [place] [phone] [place] [phone]).Regional Anesthesia Blocks  1. Numbness or the inability to move the "blocked" extremity may last from 3-48 hours after placement. The length of time depends on the medication injected and your individual response to the medication. If the numbness is not going away after 48 hours, call your surgeon.  2. The extremity that is blocked will need to be protected until the numbness is gone and the   Strength has returned. Because you cannot feel it, you will need to take extra care to avoid injury. Because it may be weak, you may have difficulty moving it or using it. You may not know what position it is in without looking at it while the block is in effect.  3. For blocks in the legs and feet, returning to weight bearing and walking needs to be done carefully. You will need to wait until the numbness is entirely gone and the strength has returned. You should be able to move your leg and foot normally before you try and bear weight or walk. You will need someone to be with you when you first try to ensure you do not fall and possibly risk injury.  4. Bruising and tenderness at the needle site are common side effects and will resolve in a few days.  5. Persistent numbness or new problems with movement should be communicated to the surgeon or the [place] [phone] Maria Antonia [phone]).  Post Anesthesia Home Care Instructions  Activity: Get plenty of rest for the remainder of the day. A responsible individual must stay with you for 24 hours following the procedure.  For the next 24 hours, DO NOT: -Drive a car -Paediatric nurse -Drink alcoholic beverages -Take any medication unless instructed by your physician -Make any legal decisions or sign important papers.  Meals: Start with liquid foods such as gelatin or soup. Progress  to regular foods as tolerated. Avoid greasy, spicy, heavy foods. If nausea and/or vomiting occur, drink only clear liquids until the nausea and/or vomiting subsides. Call your physician if vomiting continues.  Special Instructions/Symptoms: Your throat may feel dry or sore from the anesthesia or the breathing tube placed in your throat during surgery. If this causes discomfort, gargle with warm salt water. The discomfort should disappear within 24 hours.

## 2017-07-17 NOTE — Transfer of Care (Signed)
Immediate Anesthesia Transfer of Care Note  Patient: [name] [name]  Procedure(s) Performed: CARPAL TUNNEL RELEASE (Right )  Patient Location: PACU  Anesthesia Type:MAC and Bier block  Level of Consciousness: awake, alert  and oriented  Airway & Oxygen Therapy: Patient Spontanous Breathing and Patient connected to nasal cannula oxygen  Post-op Assessment: Report given to RN and Post -op Vital signs reviewed and stable  Post vital signs: Reviewed and stable  Last Vitals:  Vitals Value Taken Time  BP 129/61 07/17/2017  1:10 PM  Temp    Pulse 54 07/17/2017  1:11 PM  Resp 13 07/17/2017  1:11 PM  SpO2 95 % 07/17/2017  1:11 PM  Vitals shown include unvalidated device data.  Last Pain:  Vitals:   07/17/17 0942  TempSrc: Oral         Complications: No apparent anesthesia complications

## 2017-07-17 NOTE — Op Note (Signed)
[name] [name], [name] [name] MEDICAL RECORD TA:[id number] ACCOUNT [id number] DATE OF BIRTH:[birth year] FACILITY: [facility] LOCATION: [facility] PHYSICIAN:Kaiyon Hynes [name], MD  OPERATIVE REPORT  DATE OF PROCEDURE:  07/17/2017  PREOPERATIVE DIAGNOSIS:  Right carpal tunnel syndrome.  POSTOPERATIVE DIAGNOSIS:  Right carpal tunnel syndrome.  PROCEDURE:  Right carpal tunnel release.  SURGEON:  Dorna Leitz, MD  ASSISTANT:  Catalina Pizza, M.D.  ANESTHESIA:  IV regional.  BRIEF HISTORY:  The patient is a 67 year old female with a long history of significant complaints of numbness and tingling and weakness of grip in her right hand.  She was having some clumsiness and dropping things.  EMG was obtained, which showed severe  carpal tunnel syndrome on the right side.  After failure of conservative care, she was taken to the operating room for right carpal tunnel release.  DESCRIPTION OF PROCEDURE:  The patient was taken to the operating room after adequate anesthesia was obtained with a forearm based IV regional Bier block.  The patient was placed supine on the operating table.  The right arm was prepped and draped in the  usual sterile fashion.  Following this, an incision was made in line with the carpal tunnel.  Subcutaneous tissue down to level of the volar carpal ligament, which was clearly identified.  A small rent was made in the ligament.  A Freer elevator was  used through this rent to make sure the nerve was not adherent on the underside and then a curved scissors made carrying it to point the blades ulnarly to release the ligament proximally and distally.  The nerve was then identified.  It was irrigated and  suctioned dry after we made sure that the nerve was completely free in a proximal and distal direction.  At this point, the wound was closed with interrupted and running nylon suture.  Sterile compressive dressing was applied as well as a volar plaster.     The patient was taken to recovery  room in satisfactory condition.  Estimated blood loss for the procedure is none.  AN/NUANCE  D:07/17/2017 T:07/17/2017 JOB:[id number]

## 2017-07-17 NOTE — H&P (Addendum)
PREOPERATIVE H&P  Chief Complaint: Numbness and tingling right hand  HPI: [name] [name] is a 67 y.o. female who presents for evaluation of numbness and tingling right hand. It has been present for several months and has been worsening. She has failed conservative measures. Pain is rated as moderate.  Past Medical History:  Diagnosis Date  . Carpal tunnel syndrome of right wrist   . Coronary artery disease    per cath 11-29-2013  mild diffuse CAD and calcification in proximal LAD  . Depression   . Diverticulosis   . GERD (gastroesophageal reflux disease)   . History of endometrial cancer 05/2003   FIGO, Grade 1  s/p  TAG w/ BSO  . HTN (hypertension)   . Hyperlipidemia   . OA (osteoarthritis)   . OSA on CPAP    followed by dr Elsworth Soho-- per study 03-26-2012  mod. to severe OSA , AHI 37/hr  . Type II diabetes mellitus ([place])    followed by pcp  . Urinary incontinence in female   . Vitamin D deficiency    Past Surgical History:  Procedure Laterality Date  . CARDIAC CATHETERIZATION  11/29/2013   dr Einar Gip   mild diffuse noncritial CAD and mild calcification involving the proximal LAD,  LVEF 55-60%  . KNEE ARTHROSCOPY Left 10/ 2004;  05-17-2008   dr Jurney Overacker  . KNEE ARTHROSCOPY Right 11/2012  . LAPAROSCOPIC CHOLECYSTECTOMY  11-19-2006  dr Zella Richer  [facility]  . LEFT HEART CATHETERIZATION WITH CORONARY ANGIOGRAM N/A 11/29/2013   Procedure: LEFT HEART CATHETERIZATION WITH CORONARY ANGIOGRAM;  Surgeon: Laverda Page, MD;  Location[facility] C[facility] LAB;  Service: Cardiovascular;  Laterality: N/A;  . TOTAL ABDOMINAL HYSTERECTOMY W/ BILATERAL SALPINGOOPHORECTOMY  06-20-2003    dr Mohammed Kindle  [facility]   endometrial cancer  . TOTAL HIP ARTHROPLASTY Right 09/02/2013   Procedure: RIGHT TOTAL HIP ARTHROPLASTY ANTERIOR APPROACH;  Surgeon: Alta Corning, MD;  Location: [facility];  Service: Orthopedics;  Laterality: Right;  . TOTAL KNEE ARTHROPLASTY Left 08/02/2012   Procedure: TOTAL KNEE ARTHROPLASTY;  Surgeon: Alta Corning, MD;  Location: [facility];  Service: Orthopedics;  Laterality: Left;  left total knee arthroplasty  . TUBAL LIGATION  yrs ago   Social History   Socioeconomic History  . Marital status: Married    Spouse name: Brinna Divelbiss  . Number of children: 3  . Years of education: Not on file  . Highest education level: Not on file  Occupational History  . Occupation: housewife  Social Needs  . Financial resource strain: Not on file  . Food insecurity:    Worry: Not on file    Inability: Not on file  . Transportation needs:    Medical: Not on file    Non-medical: Not on file  Tobacco Use  . Smoking status: Never Smoker  . Smokeless tobacco: Never Used  Substance and Sexual Activity  . Alcohol use: No  . Drug use: No  . Sexual activity: Not on file  Lifestyle  . Physical activity:    Days per week: Not on file    Minutes per session: Not on file  . Stress: Not on file  Relationships  . Social connections:    Talks on phone: Not on file    Gets together: Not on file    Attends religious service: Not on file    Active member of club or organization: Not on file    Attends meetings of clubs or organizations: Not on file  Relationship status: Not on file  Other Topics Concern  . Not on file  Social History Narrative  . Not on file   Family History  Problem Relation Age of Onset  . Heart disease Father 76       CABG  . Lung cancer Father 62       lung  . Hypertension Father   . Diabetes Father   . High Cholesterol Father   . Diabetes type II Father   . Heart disease Sister 8       CABG  . Diabetes Sister 79  . Heart disease Brother 34       MI with stents  . Hypertension Mother   . Diabetes Mother   . High Cholesterol Mother   . Sudden death Mother   . Thyroid disease Mother   . Obesity Mother   . Hypertension Sister        x2   . Diabetes Sister   . Hypertension Brother   . Diabetes Brother   . Cancer Sister        high grade Neuroendocrine carcinoma  right axilla  . Pancreatic cancer Other        x 3 - 2 MAunts and 1 P Aunts.   . Lupus Other        Pat Aunt   Allergies  Allergen Reactions  . Codeine Palpitations   Prior to Admission medications   Medication Sig Start Date End Date Taking? Authorizing Provider  amLODipine (NORVASC) 10 MG tablet Take 1 tablet (10 mg total) by mouth daily. Patient taking differently: Take 10 mg by mouth every morning.  11/30/13  Yes Adrian Prows, MD  atorvastatin (LIPITOR) 80 MG tablet Take 1 tablet by mouth every evening.  04/11/15  Yes [provider]  benazepril (LOTENSIN) 40 MG tablet Take 40 mg by mouth every evening.    Yes [provider]  bisoprolol (ZEBETA) 10 MG tablet Take 10 mg by mouth every morning.    Yes [provider]  cetirizine (ZYRTEC) 10 MG tablet Take 10 mg by mouth at bedtime.    Yes [provider]  chlorthalidone (HYGROTON) 25 MG tablet Take 25 mg by mouth every morning. 1 table once daily 12/30/13  Yes [provider]  escitalopram (LEXAPRO) 20 MG tablet Take 20 mg by mouth every evening.    Yes [provider]  linagliptin (TRADJENTA) 5 MG TABS tablet Take 5 mg by mouth every morning.  01/13/17  Yes [provider]  meloxicam (MOBIC) 15 MG tablet Take 15 mg by mouth every morning.    Yes [provider]  metFORMIN (GLUCOPHAGE) 1000 MG tablet Take 1 tablet (1,000 mg total) by mouth 2 (two) times daily with a meal. Patient taking differently: Take 1,000 mg by mouth 2 (two) times daily with a meal.  12/01/13  Yes Adrian Prows, MD  modafinil (PROVIGIL) 100 MG tablet TAKE 1 TABLET BY MOUTH EVERY DAY Patient taking differently: TAKE 1 TABLET BY MOUTH EVERY DAY-- takes in am 04/27/17  Yes Rigoberto Noel, MD  Multiple Vitamin (MULTIVITAMIN) tablet Take 1 tablet by mouth daily.   Yes [provider]  omeprazole (PRILOSEC) 20 MG capsule Take 20 mg by mouth every morning.    Yes [provider]  ondansetron  (ZOFRAN) 4 MG tablet Take 1 tablet (4 mg total) by mouth every 8 (eight) hours as needed for nausea or vomiting. 05/24/17  Yes Margarita Mail, PA-C  Vitamin D, Ergocalciferol, (  DRISDOL) 50000 units CAPS capsule Take 1 capsule (50,000 Units total) by mouth every 7 (seven) days. Patient taking differently: Take 50,000 Units by mouth every 7 (seven) days.  07/09/17  Yes Eber Jones, MD     Positive ROS: None  All other systems have been reviewed and were otherwise negative with the exception of those mentioned in the HPI and as above.  Physical Exam: There were no vitals filed for this visit. Vitals:   07/17/17 0942  BP: (!) 150/70  Pulse: (!) 53  Resp: 18  Temp: 98 F (36.7 C)  SpO2: 96%    General: Alert, no acute distress Cardiovascular: No pedal edema Respiratory: No cyanosis, no use of accessory musculature GI: No organomegaly, abdomen is soft and non-tender Skin: No lesions in the area of chief complaint Neurologic: Sensation intact distally Psychiatric: Patient is competent for consent with normal mood and affect Lymphatic: No axillary or cervical lymphadenopathy  MUSCULOSKELETAL: Right wrist: Positive Tinel's, positive Phalen's, no thenar atrophy.  Assessment/Plan: RIGHT WRIST CARPAL TUNNEL SYNDROME Plan for Procedure(s): CARPAL TUNNEL RELEASE  The risks benefits and alternatives were discussed with the patient including but not limited to the risks of nonoperative treatment, versus surgical intervention including infection, bleeding, nerve injury, malunion, nonunion, hardware prominence, hardware failure, need for hardware removal, blood clots, cardiopulmonary complications, morbidity, mortality, among others, and they were willing to proceed.  Predicted outcome is good, although there will be at least a six to nine month expected recovery.  Alta Corning, MD 07/17/2017 8:54 AM

## 2017-07-17 NOTE — Anesthesia Preprocedure Evaluation (Addendum)
Anesthesia Evaluation  Patient identified by MRN, date of birth, ID band Patient awake    Reviewed: Allergy & Precautions, NPO status , Patient's Chart, lab work & pertinent test results  Airway Mallampati: II  TM Distance: >3 FB     Dental   Pulmonary sleep apnea ,    breath sounds clear to auscultation       Cardiovascular hypertension, + CAD   Rhythm:Regular Rate:Normal     Neuro/Psych    GI/Hepatic GERD  ,  Endo/Other  diabetes  Renal/GU      Musculoskeletal   Abdominal   Peds  Hematology   Anesthesia Other Findings   Reproductive/Obstetrics                             Anesthesia Physical Anesthesia Plan  ASA: III  Anesthesia Plan: MAC and Bier Block and Bier Block-LIDOCAINE ONLY   Post-op Pain Management:    Induction: Intravenous  PONV Risk Score and Plan: Treatment may vary due to age or medical condition  Airway Management Planned: Simple Face Mask and Nasal Cannula  Additional Equipment:   Intra-op Plan:   Post-operative Plan:   Informed Consent: I have reviewed the patients History and Physical, chart, labs and discussed the procedure including the risks, benefits and alternatives for the proposed anesthesia with the patient or authorized representative who has indicated his/her understanding and acceptance.   Dental advisory given  Plan Discussed with: CRNA, Anesthesiologist and Surgeon  Anesthesia Plan Comments:        Anesthesia Quick Evaluation

## 2017-07-17 NOTE — Brief Op Note (Signed)
07/17/2017  1:05 PM  PATIENT:  [name]  67 y.o. female  PRE-OPERATIVE DIAGNOSIS:  RIGHT WRIST CARPAL TUNNEL SYNDROME  POST-OPERATIVE DIAGNOSIS:  RIGHT WRIST CARPAL TUNNEL SYNDROME  PROCEDURE:  Procedure(s): CARPAL TUNNEL RELEASE (Right)  SURGEON:  Surgeon(s) and Role:    Dorna Leitz, MD - Primary  PHYSICIAN ASSISTANT:   ASSISTANTS: bethune   ANESTHESIA:   regional  EBL:  2 mL   BLOOD ADMINISTERED:none  DRAINS: none   LOCAL MEDICATIONS USED:  MARCAINE     SPECIMEN:  No Specimen  DISPOSITION OF SPECIMEN:  N/A  COUNTS:  YES  TOURNIQUET:   Total Tourniquet Time Documented: Forearm (laterality) - 25 minutes Total: Forearm (laterality) - 25 minutes   DICTATION: .Other Dictation: Dictation Number [id number]  PLAN OF CARE: Discharge to home after PACU  PATIENT DISPOSITION:  PACU - hemodynamically stable.   Delay start of Pharmacological VTE agent (>24hrs) due to surgical blood loss or risk of bleeding: no

## 2017-07-21 ENCOUNTER — Encounter (HOSPITAL_BASED_OUTPATIENT_CLINIC_OR_DEPARTMENT_OTHER): Payer: Self-pay | Admitting: Orthopedic Surgery

## 2017-07-23 DIAGNOSIS — G5601 Carpal tunnel syndrome, right upper limb: Secondary | ICD-10-CM | POA: Diagnosis not present

## 2017-07-30 DIAGNOSIS — G5601 Carpal tunnel syndrome, right upper limb: Secondary | ICD-10-CM | POA: Diagnosis not present

## 2017-08-04 ENCOUNTER — Ambulatory Visit (INDEPENDENT_AMBULATORY_CARE_PROVIDER_SITE_OTHER): Payer: Medicare Other | Admitting: Family Medicine

## 2017-08-04 VITALS — BP 118/68 | HR 56 | Temp 98.0°F | Ht 63.0 in | Wt 240.0 lb

## 2017-08-04 DIAGNOSIS — Z6841 Body Mass Index (BMI) 40.0 and over, adult: Secondary | ICD-10-CM | POA: Diagnosis not present

## 2017-08-04 DIAGNOSIS — E66813 Obesity, class 3: Secondary | ICD-10-CM

## 2017-08-04 DIAGNOSIS — I10 Essential (primary) hypertension: Secondary | ICD-10-CM

## 2017-08-04 DIAGNOSIS — E559 Vitamin D deficiency, unspecified: Secondary | ICD-10-CM

## 2017-08-04 DIAGNOSIS — E119 Type 2 diabetes mellitus without complications: Secondary | ICD-10-CM | POA: Diagnosis not present

## 2017-08-04 MED ORDER — VITAMIN D (ERGOCALCIFEROL) 1.25 MG (50000 UNIT) PO CAPS: 50000.0000 [IU] | ORAL_CAPSULE | ORAL | 0 refills | Status: DC

## 2017-08-04 NOTE — Progress Notes (Signed)
Office: [phone]  /  Fax: [phone]   HPI:   Chief Complaint: OBESITY [name] [name] is here to discuss her progress with her obesity treatment plan. She is on the Category 2 plan and is following her eating plan approximately 85 % of the time. She states she is walking for 30 minutes 3 times per week. [name] [name] had some birthday parties, graduation parties and other celebrations. She is going on a cruise, the middle of next weeks for one week. Her weight is 240 lb (108.9 kg) today and has had a weight loss of 1 pound over a period of 4 weeks since her last visit. She has lost 8 lbs since starting treatment with [name].  Vitamin D deficiency [name] [name] has a diagnosis of vitamin D deficiency. [name] [name] is currently taking vit D. Fatigue is improving and she denies nausea, vomiting or muscle weakness.  Diabetes II [name] [name] has a diagnosis of diabetes type II. She is on Metformin and Tradjenta. [name] [name] forgot her blood sugar log today and states fasting BGs range between 95 and 110, post prandial BGs range around 130 approximately. She denies any feelings of hypoglycemia on the meal plan. Last A1c was at 6.9 She has been working on intensive lifestyle modifications including diet, exercise, and weight loss to help control her blood glucose levels.  Hypertension [name] [name] is a 67 y.o. female with hypertension. [name] [name] denies dizziness or lightheadedness. She is working weight loss to help control her blood pressure with the goal of decreasing her risk of heart attack and stroke. [name] [name] blood pressure is controlled.  ALLERGIES: Allergies  Allergen Reactions  . Codeine Palpitations    MEDICATIONS: Current Outpatient Medications on File Prior to Visit  Medication Sig Dispense Refill  . amLODipine (NORVASC) 10 MG tablet Take 1 tablet (10 mg total) by mouth daily. (Patient taking differently: Take 10 mg by mouth every morning. ) 30 tablet 2  . atorvastatin (LIPITOR) 80 MG tablet  Take 1 tablet by mouth every evening.   5  . benazepril (LOTENSIN) 40 MG tablet Take 40 mg by mouth every evening.     . bisoprolol (ZEBETA) 10 MG tablet Take 10 mg by mouth every morning.     . cetirizine (ZYRTEC) 10 MG tablet Take 10 mg by mouth at bedtime.     . chlorthalidone (HYGROTON) 25 MG tablet Take 25 mg by mouth every morning. 1 table once daily  3  . escitalopram (LEXAPRO) 20 MG tablet Take 20 mg by mouth every evening.     Marland Kitchen HYDROcodone-acetaminophen (NORCO) 5-325 MG tablet Take 1-2 tablets by mouth every 6 (six) hours as needed for moderate pain. 30 tablet 0  . linagliptin (TRADJENTA) 5 MG TABS tablet Take 5 mg by mouth every morning.     . meloxicam (MOBIC) 15 MG tablet Take 15 mg by mouth every morning.     . metFORMIN (GLUCOPHAGE) 1000 MG tablet Take 1 tablet (1,000 mg total) by mouth 2 (two) times daily with a meal. (Patient taking differently: Take 1,000 mg by mouth 2 (two) times daily with a meal. )    . modafinil (PROVIGIL) 100 MG tablet TAKE 1 TABLET BY MOUTH EVERY DAY (Patient taking differently: TAKE 1 TABLET BY MOUTH EVERY DAY-- takes in am) 30 tablet 2  . Multiple Vitamin (MULTIVITAMIN) tablet Take 1 tablet by mouth daily.    Marland Kitchen omeprazole (PRILOSEC) 20 MG capsule Take 20 mg by mouth every morning.     . ondansetron (ZOFRAN) 4  MG tablet Take 1 tablet (4 mg total) by mouth every 8 (eight) hours as needed for nausea or vomiting. 10 tablet 0  . Vitamin D, Ergocalciferol, (DRISDOL) 50000 units CAPS capsule Take 1 capsule (50,000 Units total) by mouth every 7 (seven) days. (Patient taking differently: Take 50,000 Units by mouth every 7 (seven) days. ) 4 capsule 0   No current facility-administered medications on file prior to visit.     PAST MEDICAL HISTORY: Past Medical History:  Diagnosis Date  . Carpal tunnel syndrome of right wrist   . Coronary artery disease    per cath 11-29-2013  mild diffuse CAD and calcification in proximal LAD  . Depression   . Diverticulosis     . GERD (gastroesophageal reflux disease)   . History of endometrial cancer 05/2003   FIGO, Grade 1  s/p  TAG w/ BSO  . HTN (hypertension)   . Hyperlipidemia   . OA (osteoarthritis)   . OSA on CPAP    followed by dr Elsworth Soho-- per study 03-26-2012  mod. to severe OSA , AHI 37/hr  . Type II diabetes mellitus ([place]    followed by pcp  . Urinary incontinence in female   . Vitamin D deficiency     PAST SURGICAL HISTORY: Past Surgical History:  Procedure Laterality Date  . CARDIAC CATHETERIZATION  11/29/2013   dr Einar Gip   mild diffuse noncritial CAD and mild calcification involving the proximal LAD,  LVEF 55-60%  . CARPAL TUNNEL RELEASE Right 07/17/2017   Procedure: CARPAL TUNNEL RELEASE;  Surgeon: Dorna Leitz, MD;  Location: [facility];  Service: Orthopedics;  Laterality: Right;  . KNEE ARTHROSCOPY Left 10/ 2004;  05-17-2008   dr graves  . KNEE ARTHROSCOPY Right 11/2012  . LAPAROSCOPIC CHOLECYSTECTOMY  11-19-2006  dr Zella Richer  [facility]  . LEFT HEART CATHETERIZATION WITH CORONARY ANGIOGRAM N/A 11/29/2013   Procedure: LEFT HEART CATHETERIZATION WITH CORONARY ANGIOGRAM;  Surgeon: Laverda Page, MD;  Location: [facility] CATH LAB;  Service: Cardiovascular;  Laterality: N/A;  . TOTAL ABDOMINAL HYSTERECTOMY W/ BILATERAL SALPINGOOPHORECTOMY  06-20-2003    dr Mohammed Kindle  [facility]   endometrial cancer  . TOTAL HIP ARTHROPLASTY Right 09/02/2013   Procedure: RIGHT TOTAL HIP ARTHROPLASTY ANTERIOR APPROACH;  Surgeon: Alta Corning, MD;  Location: [place];  Service: Orthopedics;  Laterality: Right;  . TOTAL KNEE ARTHROPLASTY Left 08/02/2012   Procedure: TOTAL KNEE ARTHROPLASTY;  Surgeon: Alta Corning, MD;  Location: [place];  Service: Orthopedics;  Laterality: Left;  left total knee arthroplasty  . TUBAL LIGATION  yrs ago    SOCIAL HISTORY: Social History   Tobacco Use  . Smoking status: Never Smoker  . Smokeless tobacco: Never Used  Substance Use Topics  . Alcohol use: No  . Drug use: No     FAMILY HISTORY: Family History  Problem Relation Age of Onset  . Heart disease Father 68       CABG  . Lung cancer Father 72       lung  . Hypertension Father   . Diabetes Father   . High Cholesterol Father   . Diabetes type II Father   . Heart disease Sister 39       CABG  . Diabetes Sister 42  . Heart disease Brother 44       MI with stents  . Hypertension Mother   . Diabetes Mother   . High Cholesterol Mother   . Sudden death Mother   . Thyroid  disease Mother   . Obesity Mother   . Hypertension Sister        x2   . Diabetes Sister   . Hypertension Brother   . Diabetes Brother   . Cancer Sister        high grade Neuroendocrine carcinoma right axilla  . Pancreatic cancer Other        x 3 - 2 MAunts and 1 P Aunts.   . Lupus Other        Pat Aunt    ROS: Review of Systems  Constitutional: Positive for malaise/fatigue and weight loss.  Gastrointestinal: Negative for nausea and vomiting.  Musculoskeletal:       Negative for muscle weakness  Neurological: Negative for dizziness.       Negative for lightheadedness  Endo/Heme/Allergies:       Negative for hypoglycemia    PHYSICAL EXAM: Blood pressure 118/68, pulse (!) 56, temperature 98 F (36.7 C), temperature source Oral, height 5\' 3"  (1.6 m), weight 240 lb (108.9 kg), last menstrual period 11/25/2002, SpO2 96 %. Body mass index is 42.51 kg/m. Physical Exam  Constitutional: She is oriented to person, place, and time. She appears well-developed and well-nourished.  Cardiovascular: Normal rate.  Pulmonary/Chest: Effort normal.  Musculoskeletal: Normal range of motion.  Neurological: She is oriented to person, place, and time.  Skin: Skin is warm and dry.  Psychiatric: She has a normal mood and affect. Her behavior is normal.  Vitals reviewed.   RECENT LABS AND TESTS: BMET    Component Value Date/Time   NA 141 07/17/2017 1111   NA 138 04/29/2017 0935   K 4.1 07/17/2017 1111   CL 101 07/17/2017 1111    CO2 28 05/24/2017 1604   GLUCOSE 112 (H) 07/17/2017 1111   BUN 27 (H) 07/17/2017 1111   BUN 28 (H) 04/29/2017 0935   CREATININE 0.80 07/17/2017 1111   CREATININE 1.24 (H) 07/16/2015 1339   CALCIUM 9.9 05/24/2017 1604   GFRNONAA >60 05/24/2017 1604   GFRAA >60 05/24/2017 1604   Lab Results  Component Value Date   HGBA1C 6.9 (H) 04/29/2017   HGBA1C 7.0 (H) 07/16/2015   Lab Results  Component Value Date   INSULIN 36.0 (H) 04/29/2017   CBC    Component Value Date/Time   WBC 8.4 05/24/2017 1604   RBC 4.41 05/24/2017 1604   HGB 12.9 07/17/2017 1111   HGB 13.1 04/29/2017 0935   HGB 11.2 (L) 07/11/2014 1340   HCT 38.0 07/17/2017 1111   HCT 39.4 04/29/2017 0935   PLT 262 05/24/2017 1604   MCV 91.8 05/24/2017 1604   MCV 88 04/29/2017 0935   MCH 29.9 05/24/2017 1604   MCHC 32.6 05/24/2017 1604   RDW 13.7 05/24/2017 1604   RDW 14.2 04/29/2017 0935   LYMPHSABS 1.8 05/24/2017 1604   LYMPHSABS 2.1 04/29/2017 0935   MONOABS 0.7 05/24/2017 1604   EOSABS 0.1 05/24/2017 1604   EOSABS 0.2 04/29/2017 0935   BASOSABS 0.0 05/24/2017 1604   BASOSABS 0.0 04/29/2017 0935   Iron/TIBC/Ferritin/ %Sat No results found for: IRON, TIBC, FERRITIN, IRONPCTSAT Lipid Panel     Component Value Date/Time   CHOL 223 (H) 04/29/2017 0935   TRIG 420 (H) 04/29/2017 0935   HDL 36 (L) 04/29/2017 0935   CHOLHDL 5.9 (H) 07/16/2015 1339   VLDL NOT CALC 07/16/2015 1339   LDLCALC Comment 04/29/2017 0935   Hepatic Function Panel     Component Value Date/Time   PROT 7.8 05/24/2017 1604  PROT 7.3 04/29/2017 0935   ALBUMIN 4.1 05/24/2017 1604   ALBUMIN 4.4 04/29/2017 0935   AST 23 05/24/2017 1604   ALT 31 05/24/2017 1604   ALKPHOS 63 05/24/2017 1604   BILITOT 0.5 05/24/2017 1604   BILITOT 0.3 04/29/2017 0935      Component Value Date/Time   TSH 3.010 04/29/2017 0935   TSH 3.96 07/16/2015 1339   Results for CHELLE, CAYTON (MRN [id number]) as of 08/04/2017 11:14  Ref. Range 04/29/2017 09:35   Vitamin D, 25-Hydroxy Latest Ref Range: 30.0 - 100.0 ng/mL 29.7 (L)   ASSESSMENT AND PLAN: Vitamin D deficiency - Plan: Vitamin D, Ergocalciferol, (DRISDOL) 50000 units CAPS capsule  Essential hypertension  Type 2 diabetes mellitus without complication, without long-term current use of insulin (HCC)  Class 3 severe obesity with serious comorbidity and body mass index (BMI) of 40.0 to 44.9 in adult, unspecified obesity type ([place])  PLAN:  Vitamin D Deficiency Nyoka was informed that low vitamin D levels contributes to fatigue and are associated with obesity, breast, and colon cancer. She agrees to continue to take prescription Vit D @50 ,000 IU every week #4 with no refills and will follow up for routine testing of vitamin D, at least 2-3 times per year. She was informed of the risk of over-replacement of vitamin D and agrees to not increase her dose unless she discusses this with [name] first. Anasha agrees to follow up with our clinic in 2 weeks.  Diabetes II Buna has been given extensive diabetes education by myself today including ideal fasting and post-prandial blood glucose readings, individual ideal Hgb A1c goals and hypoglycemia prevention. We discussed the importance of good blood sugar control to decrease the likelihood of diabetic complications such as nephropathy, neuropathy, limb loss, blindness, coronary artery disease, and death. We discussed the importance of intensive lifestyle modification including diet, exercise and weight loss as the first line treatment for diabetes. Sholonda agrees to continue her diabetes medications and will follow up at the agreed upon time.  Hypertension We discussed sodium restriction, working on healthy weight loss, and a regular exercise program as the means to achieve improved blood pressure control. Susan agreed with this plan and agreed to follow up as directed. We will continue to monitor her blood pressure as well as her progress with the  above lifestyle modifications. She will continue her medications as prescribed and will watch for signs of hypotension as she continues her lifestyle modifications.  Obesity Angelyse is currently in the action stage of change. As such, her goal is to continue with weight loss efforts She has agreed to follow the Category 2 plan Sung has been instructed to work up to a goal of 150 minutes of combined cardio and strengthening exercise per week for weight loss and overall health benefits. We discussed the following Behavioral Modification Strategies today: travel eating strategies, celebration eating strategies, planning for success, increasing lean protein intake, increasing vegetables and work on meal planning and easy cooking plans  Bryley is planning to focus on protein intake while she is on her cruise  Devonne has agreed to follow up with our clinic in 2 weeks. She was informed of the importance of frequent follow up visits to maximize her success with intensive lifestyle modifications for her multiple health conditions.   OBESITY BEHAVIORAL INTERVENTION VISIT  Today's visit was # 6 out of 22.  Starting weight: 248 lbs Starting date: 04/29/17 Today's weight : 240 lbs Today's date: 08/04/2017 Total lbs lost to date:  8 (Patients must lose 7 lbs in the first 6 months to continue with counseling)   ASK: We discussed the diagnosis of obesity with [name] today and Kaneesha agreed to give [name] permission to discuss obesity behavioral modification therapy today.  ASSESS: Lavell has the diagnosis of obesity and her BMI today is 42.52 Kassadee is in the action stage of change   ADVISE: Armiyah was educated on the multiple health risks of obesity as well as the benefit of weight loss to improve her health. She was advised of the need for long term treatment and the importance of lifestyle modifications.  AGREE: Multiple dietary modification options and treatment options  were discussed and  Nalda agreed to the above obesity treatment plan.  I, Doreene Nest, am acting as transcriptionist for Eber Jones, MD  I have reviewed the above documentation for accuracy and completeness, and I agree with the above. - Ilene Qua, MD

## 2017-08-08 ENCOUNTER — Other Ambulatory Visit: Payer: Self-pay | Admitting: Pulmonary Disease

## 2017-08-10 NOTE — Telephone Encounter (Signed)
Rx for Provigil 100mg  has been refilled, as pt was instructed to continue Provigil during 04/07/17 OV.

## 2017-08-20 ENCOUNTER — Ambulatory Visit: Payer: Medicaid Other | Admitting: Certified Nurse Midwife

## 2017-08-24 ENCOUNTER — Ambulatory Visit: Payer: Medicaid Other | Admitting: Nurse Practitioner

## 2017-08-31 ENCOUNTER — Ambulatory Visit (INDEPENDENT_AMBULATORY_CARE_PROVIDER_SITE_OTHER): Payer: Medicare Other | Admitting: Physician Assistant

## 2017-08-31 ENCOUNTER — Telehealth (INDEPENDENT_AMBULATORY_CARE_PROVIDER_SITE_OTHER): Payer: Self-pay | Admitting: Physician Assistant

## 2017-08-31 VITALS — BP 117/68 | HR 49 | Temp 98.0°F | Ht 63.0 in | Wt 239.0 lb

## 2017-08-31 DIAGNOSIS — E559 Vitamin D deficiency, unspecified: Secondary | ICD-10-CM

## 2017-08-31 DIAGNOSIS — Z6841 Body Mass Index (BMI) 40.0 and over, adult: Secondary | ICD-10-CM | POA: Diagnosis not present

## 2017-08-31 DIAGNOSIS — E119 Type 2 diabetes mellitus without complications: Secondary | ICD-10-CM

## 2017-08-31 MED ORDER — VITAMIN D (ERGOCALCIFEROL) 1.25 MG (50000 UNIT) PO CAPS: 50000.0000 [IU] | ORAL_CAPSULE | ORAL | 0 refills | Status: DC

## 2017-08-31 NOTE — Progress Notes (Signed)
Office: [phone]  /  Fax: [phone]   HPI:   Chief Complaint: OBESITY [name] [name] is here to discuss her progress with her obesity treatment plan. She is on the Category 2 plan and is following her eating plan approximately 75 % of the time. She states she is walking for 30 minutes 3 times per week. [name] [name] continues to do well with weight loss. She was on a cruise and managed to be mindful of her eating and controlled her portions.  Her weight is 239 lb (108.4 kg) today and has had a weight loss of 1 pound over a period of 4 weeks since her last visit. She has lost 9 lbs since starting treatment with [name].  Vitamin D Deficiency [name] [name] has a diagnosis of vitamin D deficiency. She is currently taking prescription Vit D and denies nausea, vomiting or muscle weakness.  Diabetes II [name] [name] has a diagnosis of diabetes type II. [name] [name] states fasting BGs  Range in 120's and she denies hypoglycemia. She is on Tradjenta and metformin. Last A1c was 6.9. She has been working on intensive lifestyle modifications including diet, exercise, and weight loss to help control her blood glucose levels.  ALLERGIES: Allergies  Allergen Reactions  . Codeine Palpitations    MEDICATIONS: Current Outpatient Medications on File Prior to Visit  Medication Sig Dispense Refill  . amLODipine (NORVASC) 10 MG tablet Take 1 tablet (10 mg total) by mouth daily. (Patient taking differently: Take 10 mg by mouth every morning. ) 30 tablet 2  . atorvastatin (LIPITOR) 80 MG tablet Take 1 tablet by mouth every evening.   5  . benazepril (LOTENSIN) 40 MG tablet Take 40 mg by mouth every evening.     . bisoprolol (ZEBETA) 10 MG tablet Take 10 mg by mouth every morning.     . cetirizine (ZYRTEC) 10 MG tablet Take 10 mg by mouth at bedtime.     . chlorthalidone (HYGROTON) 25 MG tablet Take 25 mg by mouth every morning. 1 table once daily  3  . escitalopram (LEXAPRO) 20 MG tablet Take 20 mg by mouth every evening.       Marland Kitchen HYDROcodone-acetaminophen (NORCO) 5-325 MG tablet Take 1-2 tablets by mouth every 6 (six) hours as needed for moderate pain. 30 tablet 0  . linagliptin (TRADJENTA) 5 MG TABS tablet Take 5 mg by mouth every morning.     . meloxicam (MOBIC) 15 MG tablet Take 15 mg by mouth every morning.     . metFORMIN (GLUCOPHAGE) 1000 MG tablet Take 1 tablet (1,000 mg total) by mouth 2 (two) times daily with a meal. (Patient taking differently: Take 1,000 mg by mouth 2 (two) times daily with a meal. )    . modafinil (PROVIGIL) 100 MG tablet TAKE 1 TABLET BY MOUTH DAILY 30 tablet 2  . Multiple Vitamin (MULTIVITAMIN) tablet Take 1 tablet by mouth daily.    Marland Kitchen omeprazole (PRILOSEC) 20 MG capsule Take 20 mg by mouth every morning.     . ondansetron (ZOFRAN) 4 MG tablet Take 1 tablet (4 mg total) by mouth every 8 (eight) hours as needed for nausea or vomiting. 10 tablet 0  . Vitamin D, Ergocalciferol, (DRISDOL) 50000 units CAPS capsule Take 1 capsule (50,000 Units total) by mouth every 7 (seven) days. 4 capsule 0   No current facility-administered medications on file prior to visit.     PAST MEDICAL HISTORY: Past Medical History:  Diagnosis Date  . Carpal tunnel syndrome of right wrist   .  Coronary artery disease    per cath 11-29-2013  mild diffuse CAD and calcification in proximal LAD  . Depression   . Diverticulosis   . GERD (gastroesophageal reflux disease)   . History of endometrial cancer 05/2003   FIGO, Grade 1  s/p  TAG w/ BSO  . HTN (hypertension)   . Hyperlipidemia   . OA (osteoarthritis)   . OSA on CPAP    followed by dr Elsworth Soho-- per study 03-26-2012  mod. to severe OSA , AHI 37/hr  . Type II diabetes mellitus ([place])    followed by pcp  . Urinary incontinence in female   . Vitamin D deficiency     PAST SURGICAL HISTORY: Past Surgical History:  Procedure Laterality Date  . CARDIAC CATHETERIZATION  11/29/2013   dr Einar Gip   mild diffuse noncritial CAD and mild calcification involving the  proximal LAD,  LVEF 55-60%  . CARPAL TUNNEL RELEASE Right 07/17/2017   Procedure: CARPAL TUNNEL RELEASE;  Surgeon: Dorna Leitz, MD;  Location: [facility];  Service: Orthopedics;  Laterality: Right;  . KNEE ARTHROSCOPY Left 10/ 2004;  05-17-2008   dr graves  . KNEE ARTHROSCOPY Right 11/2012  . LAPAROSCOPIC CHOLECYSTECTOMY  11-19-2006  dr Zella Richer  [facility]  . LEFT HEART CATHETERIZATION WITH CORONARY ANGIOGRAM N/A 11/29/2013   Procedure: LEFT HEART CATHETERIZATION WITH CORONARY ANGIOGRAM;  Surgeon: Laverda Page, MD;  Location: [facility] C[facility] LAB;  Service: Cardiovascular;  Laterality: N/A;  . TOTAL ABDOMINAL HYSTERECTOMY W/ BILATERAL SALPINGOOPHORECTOMY  06-20-2003    dr Mohammed Kindle  [name]   endometrial cancer  . TOTAL HIP ARTHROPLASTY Right 09/02/2013   Procedure: RIGHT TOTAL HIP ARTHROPLASTY ANTERIOR APPROACH;  Surgeon: Alta Corning, MD;  Location: [place];  Service: Orthopedics;  Laterality: Right;  . TOTAL KNEE ARTHROPLASTY Left 08/02/2012   Procedure: TOTAL KNEE ARTHROPLASTY;  Surgeon: Alta Corning, MD;  Location: [place];  Service: Orthopedics;  Laterality: Left;  left total knee arthroplasty  . TUBAL LIGATION  yrs ago    SOCIAL HISTORY: Social History   Tobacco Use  . Smoking status: Never Smoker  . Smokeless tobacco: Never Used  Substance Use Topics  . Alcohol use: No  . Drug use: No    FAMILY HISTORY: Family History  Problem Relation Age of Onset  . Heart disease Father 1       CABG  . Lung cancer Father 65       lung  . Hypertension Father   . Diabetes Father   . High Cholesterol Father   . Diabetes type II Father   . Heart disease Sister 2       CABG  . Diabetes Sister 59  . Heart disease Brother 44       MI with stents  . Hypertension Mother   . Diabetes Mother   . High Cholesterol Mother   . Sudden death Mother   . Thyroid disease Mother   . Obesity Mother   . Hypertension Sister        x2   . Diabetes Sister   . Hypertension Brother   . Diabetes  Brother   . Cancer Sister        high grade Neuroendocrine carcinoma right axilla  . Pancreatic cancer Other        x 3 - 2 MAunts and 1 P Aunts.   . Lupus Other        Pat Aunt    ROS: Review of Systems  Constitutional:  Positive for weight loss.  Gastrointestinal: Negative for nausea and vomiting.  Musculoskeletal:       Negative muscle weakness  Endo/Heme/Allergies:       Negative hypoglycemia    PHYSICAL EXAM: Blood pressure 117/68, pulse (!) 49, temperature 98 F (36.7 C), temperature source Oral, height 5\' 3"  (1.6 m), weight 239 lb (108.4 kg), last menstrual period 11/25/2002, SpO2 97 %. Body mass index is 42.34 kg/m. Physical Exam  Constitutional: She is oriented to person, place, and time. She appears well-developed and well-nourished.  Cardiovascular: Bradycardia present.  Pulmonary/Chest: Effort normal.  Musculoskeletal: Normal range of motion.  Neurological: She is oriented to person, place, and time.  Skin: Skin is warm and dry.  Psychiatric: She has a normal mood and affect. Her behavior is normal.  Vitals reviewed.   RECENT LABS AND TESTS: BMET    Component Value Date/Time   NA 141 07/17/2017 1111   NA 138 04/29/2017 0935   K 4.1 07/17/2017 1111   CL 101 07/17/2017 1111   CO2 28 05/24/2017 1604   GLUCOSE 112 (H) 07/17/2017 1111   BUN 27 (H) 07/17/2017 1111   BUN 28 (H) 04/29/2017 0935   CREATININE 0.80 07/17/2017 1111   CREATININE 1.24 (H) 07/16/2015 1339   CALCIUM 9.9 05/24/2017 1604   GFRNONAA >60 05/24/2017 1604   GFRAA >60 05/24/2017 1604   Lab Results  Component Value Date   HGBA1C 6.9 (H) 04/29/2017   HGBA1C 7.0 (H) 07/16/2015   Lab Results  Component Value Date   INSULIN 36.0 (H) 04/29/2017   CBC    Component Value Date/Time   WBC 8.4 05/24/2017 1604   RBC 4.41 05/24/2017 1604   HGB 12.9 07/17/2017 1111   HGB 13.1 04/29/2017 0935   HGB 11.2 (L) 07/11/2014 1340   HCT 38.0 07/17/2017 1111   HCT 39.4 04/29/2017 0935   PLT 262  05/24/2017 1604   MCV 91.8 05/24/2017 1604   MCV 88 04/29/2017 0935   MCH 29.9 05/24/2017 1604   MCHC 32.6 05/24/2017 1604   RDW 13.7 05/24/2017 1604   RDW 14.2 04/29/2017 0935   LYMPHSABS 1.8 05/24/2017 1604   LYMPHSABS 2.1 04/29/2017 0935   MONOABS 0.7 05/24/2017 1604   EOSABS 0.1 05/24/2017 1604   EOSABS 0.2 04/29/2017 0935   BASOSABS 0.0 05/24/2017 1604   BASOSABS 0.0 04/29/2017 0935   Iron/TIBC/Ferritin/ %Sat No results found for: IRON, TIBC, FERRITIN, IRONPCTSAT Lipid Panel     Component Value Date/Time   CHOL 223 (H) 04/29/2017 0935   TRIG 420 (H) 04/29/2017 0935   HDL 36 (L) 04/29/2017 0935   CHOLHDL 5.9 (H) 07/16/2015 1339   VLDL NOT CALC 07/16/2015 1339   LDLCALC Comment 04/29/2017 0935   Hepatic Function Panel     Component Value Date/Time   PROT 7.8 05/24/2017 1604   PROT 7.3 04/29/2017 0935   ALBUMIN 4.1 05/24/2017 1604   ALBUMIN 4.4 04/29/2017 0935   AST 23 05/24/2017 1604   ALT 31 05/24/2017 1604   ALKPHOS 63 05/24/2017 1604   BILITOT 0.5 05/24/2017 1604   BILITOT 0.3 04/29/2017 0935      Component Value Date/Time   TSH 3.010 04/29/2017 0935   TSH 3.96 07/16/2015 1339    ASSESSMENT AND PLAN: Vitamin D deficiency - Plan: Vitamin D, Ergocalciferol, (DRISDOL) 50000 units CAPS capsule  Type 2 diabetes mellitus without complication, without long-term current use of insulin (HCC)  Class 3 severe obesity with serious comorbidity and body mass index (BMI) of 40.0  to 44.9 in adult, unspecified obesity type ([place])  PLAN:  Vitamin D Deficiency [name] [name] was informed that low vitamin D levels contributes to fatigue and are associated with obesity, breast, and colon cancer. [name] [name] agrees to continue taking prescription Vit D @50 ,000 IU every week #4 and we will refill for 1 month. She will follow up for routine testing of vitamin D, at least 2-3 times per year. She was informed of the risk of over-replacement of vitamin D and agrees to not increase her dose  unless she discusses this with [name] first. [name] [name] agrees to follow up with our clinic in 2 weeks.  Diabetes II [name] [name] has been given extensive diabetes education by myself today including ideal fasting and post-prandial blood glucose readings, individual ideal Hgb A1c goals and hypoglycemia prevention. We discussed the importance of good blood sugar control to decrease the likelihood of diabetic complications such as nephropathy, neuropathy, limb loss, blindness, coronary artery disease, and death. We discussed the importance of intensive lifestyle modification including diet, exercise and weight loss as the first line treatment for diabetes. [name] [name] agrees to continue her diabetes medications and she agrees to follow up with our clinic in 2 weeks.  Obesity [name] [name] is currently in the action stage of change. As such, her goal is to continue with weight loss efforts She has agreed to follow the Category 2 plan [name] [name] has been instructed to work up to a goal of 150 minutes of combined cardio and strengthening exercise per week for weight loss and overall health benefits. We discussed the following Behavioral Modification Strategies today: increasing lean protein intake and work on meal planning and easy cooking plans   [name] [name] has agreed to follow up with our clinic in 2 weeks. She was informed of the importance of frequent follow up visits to maximize her success with intensive lifestyle modifications for her multiple health conditions.   OBESITY BEHAVIORAL INTERVENTION VISIT  Today's visit was # 7 out of 22.  Starting weight: 248 lbs Starting date: 04/29/17 Today's weight : 239 lbs Today's date: 08/31/2017 Total lbs lost to date: 9 (Patients must lose 7 lbs in the first 6 months to continue with counseling)   ASK: We discussed the diagnosis of obesity with [name] today and [name] [name] agreed to give [name] permission to discuss obesity behavioral modification therapy  today.  ASSESS: [name] [name] has the diagnosis of obesity and her BMI today is 42.35 [name] [name] is in the action stage of change   ADVISE: [name] [name] was educated on the multiple health risks of obesity as well as the benefit of weight loss to improve her health. She was advised of the need for long term treatment and the importance of lifestyle modifications.  AGREE: Multiple dietary modification options and treatment options were discussed and  [name] [name] agreed to the above obesity treatment plan.   Wilhemena Durie, am acting as transcriptionist for Lacy Duverney, PA-C I, Lacy Duverney [facility], have reviewed this note and agree with its content

## 2017-09-01 DIAGNOSIS — M25532 Pain in left wrist: Secondary | ICD-10-CM | POA: Diagnosis not present

## 2017-09-01 NOTE — Telephone Encounter (Signed)
HR in office on 7-9/19 is low. Patient on Bisoprolol 10 mg. Called pt and left message to see if pt on Bisoprolol for HTN only, and if no history of Afib/tachycardia, to decrease Bisoprolol to half a pill daily (5mg ) and to follow up with PCP for further management and evaluation.

## 2017-09-08 ENCOUNTER — Encounter: Payer: Self-pay | Admitting: Certified Nurse Midwife

## 2017-09-08 ENCOUNTER — Ambulatory Visit: Payer: Medicare Other | Admitting: Certified Nurse Midwife

## 2017-09-08 ENCOUNTER — Other Ambulatory Visit: Payer: Self-pay

## 2017-09-08 VITALS — BP 142/76 | HR 60 | Resp 14 | Ht 63.25 in | Wt 243.4 lb

## 2017-09-08 DIAGNOSIS — Z01419 Encounter for gynecological examination (general) (routine) without abnormal findings: Secondary | ICD-10-CM | POA: Diagnosis not present

## 2017-09-08 DIAGNOSIS — Z124 Encounter for screening for malignant neoplasm of cervix: Secondary | ICD-10-CM

## 2017-09-08 DIAGNOSIS — Z01411 Encounter for gynecological examination (general) (routine) with abnormal findings: Secondary | ICD-10-CM

## 2017-09-08 DIAGNOSIS — N951 Menopausal and female climacteric states: Secondary | ICD-10-CM

## 2017-09-08 DIAGNOSIS — N952 Postmenopausal atrophic vaginitis: Secondary | ICD-10-CM | POA: Diagnosis not present

## 2017-09-08 DIAGNOSIS — R6882 Decreased libido: Secondary | ICD-10-CM | POA: Diagnosis not present

## 2017-09-08 NOTE — Patient Instructions (Signed)

## 2017-09-08 NOTE — Progress Notes (Signed)
67 y.o. G3P3003 Married  Caucasian Fe here for annual exam. Menopausal no HRT. Some vaginal dryness, but has noted decrease in Libido. Would like help with this if possible. Spouse healthy no issues.  Sees Dr. Ouida Sills PCP twice yearly for Cholesterol, diabetes, hypertension, GERD, anxiety/depression, management. Vitamin D management by weight loss clinic. Past history of endometrial cancer. No recurrent UTI in the last year. 10 pound weight loss in past year. Continues to work on. No other health issues today. Took cruise this year with son and grandchildren!  Patient's last menstrual period was 11/25/2002 (approximate).          Sexually active: No.  The current method of family planning is status post hysterectomy for endometrial cancer.( 2005)   Exercising: Yes.    walking Smoker:  no  Health Maintenance: Pap:  07-11-14 negative, HR HPV negative          12-25-11 negative, HR HPV negative  History of Abnormal Pap: no MMG:  08-08-16 BIRADS 2 benign, [facility]-10-17 [facility] Self Breast exams: yes Colonoscopy:  08-02-14 tubular adenoma, repeat in 5 years  BMD:   08-01-15   Normal, repeat 3 years TDaP:  07-17-15  Shingles: undecided Pneumonia: 08-03-12, 11-13-16  Hep C and HIV: 07-16-15 both negative  Labs: PCP   reports that she has never smoked. She has never used smokeless tobacco. She reports that she does not drink alcohol or use drugs.  Past Medical History:  Diagnosis Date  . Carpal tunnel syndrome of right wrist   . Coronary artery disease    per cath 11-29-2013  mild diffuse CAD and calcification in proximal LAD  . Depression   . Diverticulosis   . GERD (gastroesophageal reflux disease)   . History of endometrial cancer 05/2003   FIGO, Grade 1  s/p  TAG w/ BSO  . HTN (hypertension)   . Hyperlipidemia   . OA (osteoarthritis)   . OSA on CPAP    followed by dr Elsworth Soho-- per study 03-26-2012  mod. to severe OSA , AHI 37/hr  . Type II diabetes mellitus ([place])    followed by pcp  .  Urinary incontinence in female   . Vitamin D deficiency     Past Surgical History:  Procedure Laterality Date  . CARDIAC CATHETERIZATION  11/29/2013   dr Einar Gip   mild diffuse noncritial CAD and mild calcification involving the proximal LAD,  LVEF 55-60%  . CARPAL TUNNEL RELEASE Right 07/17/2017   Procedure: CARPAL TUNNEL RELEASE;  Surgeon: Dorna Leitz, MD;  Location: [place];  Service: Orthopedics;  Laterality: Right;  . KNEE ARTHROSCOPY Left 10/ 2004;  05-17-2008   dr graves  . KNEE ARTHROSCOPY Right 11/2012  . LAPAROSCOPIC CHOLECYSTECTOMY  11-19-2006  dr Zella Richer  [facility]  . LEFT HEART CATHETERIZATION WITH CORONARY ANGIOGRAM N/A 11/29/2013   Procedure: LEFT HEART CATHETERIZATION WITH CORONARY ANGIOGRAM;  Surgeon: Laverda Page, MD;  Location: [facility] CATH LAB;  Service: Cardiovascular;  Laterality: N/A;  . TOTAL ABDOMINAL HYSTERECTOMY W/ BILATERAL SALPINGOOPHORECTOMY  06-20-2003    dr Mohammed Kindle  [facility]   endometrial cancer  . TOTAL HIP ARTHROPLASTY Right 09/02/2013   Procedure: RIGHT TOTAL HIP ARTHROPLASTY ANTERIOR APPROACH;  Surgeon: Alta Corning, MD;  Location: [place];  Service: Orthopedics;  Laterality: Right;  . TOTAL KNEE ARTHROPLASTY Left 08/02/2012   Procedure: TOTAL KNEE ARTHROPLASTY;  Surgeon: Alta Corning, MD;  Location: [place];  Service: Orthopedics;  Laterality: Left;  left total knee arthroplasty  .  TUBAL LIGATION  yrs ago    Current Outpatient Medications  Medication Sig Dispense Refill  . ACCU-CHEK AVIVA PLUS test strip U UTD  5  . ACCU-CHEK SOFTCLIX LANCETS lancets U UTD  11  . amLODipine (NORVASC) 10 MG tablet Take 1 tablet (10 mg total) by mouth daily. (Patient taking differently: Take 10 mg by mouth every morning. ) 30 tablet 2  . atorvastatin (LIPITOR) 80 MG tablet Take 1 tablet by mouth every evening.   5  . benazepril (LOTENSIN) 40 MG tablet Take 40 mg by mouth every evening.     . bisoprolol (ZEBETA) 10 MG tablet Take 10 mg by mouth every morning.      . cetirizine (ZYRTEC) 10 MG tablet Take 10 mg by mouth at bedtime.     . chlorthalidone (HYGROTON) 25 MG tablet Take 25 mg by mouth every morning. 1 table once daily  3  . escitalopram (LEXAPRO) 20 MG tablet Take 20 mg by mouth every evening.     . linagliptin (TRADJENTA) 5 MG TABS tablet Take 5 mg by mouth every morning.     . meloxicam (MOBIC) 15 MG tablet Take 15 mg by mouth every morning.     . metFORMIN (GLUCOPHAGE) 1000 MG tablet Take 1 tablet (1,000 mg total) by mouth 2 (two) times daily with a meal. (Patient taking differently: Take 1,000 mg by mouth 2 (two) times daily with a meal. )    . modafinil (PROVIGIL) 100 MG tablet TAKE 1 TABLET BY MOUTH DAILY 30 tablet 2  . Multiple Vitamin (MULTIVITAMIN) tablet Take 1 tablet by mouth daily.    Marland Kitchen omeprazole (PRILOSEC) 20 MG capsule Take 20 mg by mouth every morning.     . ondansetron (ZOFRAN) 4 MG tablet Take 1 tablet (4 mg total) by mouth every 8 (eight) hours as needed for nausea or vomiting. 10 tablet 0  . Vitamin D, Ergocalciferol, (DRISDOL) 50000 units CAPS capsule Take 1 capsule (50,000 Units total) by mouth every 7 (seven) days. 4 capsule 0   No current facility-administered medications for this visit.     Family History  Problem Relation Age of Onset  . Heart disease Father 28       CABG  . Lung cancer Father 52       lung  . Hypertension Father   . Diabetes Father   . High Cholesterol Father   . Diabetes type II Father   . Heart disease Sister 2       CABG  . Diabetes Sister 21  . Heart disease Brother 98       MI with stents  . Hypertension Mother   . Diabetes Mother   . High Cholesterol Mother   . Sudden death Mother   . Thyroid disease Mother   . Obesity Mother   . Hypertension Sister        x2   . Diabetes Sister   . Hypertension Brother   . Diabetes Brother   . Cancer Sister        high grade Neuroendocrine carcinoma right axilla  . Pancreatic cancer Other        x 3 - 2 MAunts and 1 P Aunts.   . Lupus  Other        Pat Aunt    ROS:  Pertinent items are noted in HPI.  Otherwise, a comprehensive ROS was negative.  Exam:   BP (!) 142/76 (BP Location: Right Arm, Patient Position: Sitting, Cuff Size: Large)  Pulse 60   Resp 14   Ht 5' 3.25" (1.607 m)   Wt 243 lb 6.4 oz (110.4 kg)   LMP 11/25/2002 (Approximate)   BMI 42.78 kg/m  Height: 5' 3.25" (160.7 cm) Ht Readings from Last 3 Encounters:  09/08/17 5' 3.25" (1.607 m)  08/31/17 5\' 3"  (1.6 m)  08/04/17 5\' 3"  (1.6 m)    General appearance: alert, cooperative and appears stated age Head: Normocephalic, without obvious abnormality, atraumatic Neck: no adenopathy, supple, symmetrical, trachea midline and thyroid normal to inspection and palpation Lungs: clear to auscultation bilaterally Breasts: normal appearance, no masses or tenderness, No nipple retraction or dimpling, No nipple discharge or bleeding, No axillary or supraclavicular adenopathy Heart: regular rate and rhythm Abdomen: soft, non-tender; no masses,  no organomegaly Extremities: extremities normal, atraumatic, no cyanosis or edema Skin: Skin color, texture, turgor normal. No rashes or lesions Lymph nodes: Cervical, supraclavicular, and axillary nodes normal. No abnormal inguinal nodes palpated Neurologic: Grossly normal   Pelvic: External genitalia:  no lesions, normal female              Urethra:  normal appearing urethra with no masses, tenderness or lesions              Bartholin's and Skene's: normal                 Vagina: atrophic appearing vagina with normal color and discharge, no lesions              Cervix: absent              Pap taken: No. Bimanual Exam:  Uterus:  uterus absent              Adnexa: no mass, fullness, tenderness and adnexa surgically absent               Rectovaginal: Confirms               Anus:  normal sphincter tone, no lesions  Chaperone present: yes  A:  Well Woman with normal exam  Post menopausal no HRT S/P TAH, BSO for  endometrial cancer 2005  Decrease libido, with atrophic vaginitis  Hypertension, Diabetes, Vitamin D deficiency, cholesterol, MVP with MD management  P:   Reviewed health and wellness pertinent to exam  Aware of need to evaluate if vaginal bleeding  Discussed OTC treatment for vaginal dryness and encouraging spouse to help her  with this. Suggested coconut, Olive oil, Replens or Vagisil moisturizer trial. Estrogen also available, but due to endometrial cancer would not be first line recommendation. Questions addressed. Discussed planning date night to just be together and taking time to do this. Questions addressed. Patient will advise if dryness does not respond to OTC products. Suggested daily use.  Continue MD follow up as indicated  Pap smear: no   counseled on breast self exam, mammography screening, feminine hygiene, adequate intake of calcium and vitamin D, diet and exercise, Kegel's exercises  return annually or prn  An After Visit Summary was printed and given to the patient.

## 2017-09-15 DIAGNOSIS — C44722 Squamous cell carcinoma of skin of right lower limb, including hip: Secondary | ICD-10-CM | POA: Diagnosis not present

## 2017-09-15 DIAGNOSIS — Z1231 Encounter for screening mammogram for malignant neoplasm of breast: Secondary | ICD-10-CM | POA: Diagnosis not present

## 2017-09-15 DIAGNOSIS — L814 Other melanin hyperpigmentation: Secondary | ICD-10-CM | POA: Diagnosis not present

## 2017-09-15 DIAGNOSIS — D1801 Hemangioma of skin and subcutaneous tissue: Secondary | ICD-10-CM | POA: Diagnosis not present

## 2017-09-15 DIAGNOSIS — Z85828 Personal history of other malignant neoplasm of skin: Secondary | ICD-10-CM | POA: Diagnosis not present

## 2017-09-15 DIAGNOSIS — L820 Inflamed seborrheic keratosis: Secondary | ICD-10-CM | POA: Diagnosis not present

## 2017-09-15 DIAGNOSIS — D485 Neoplasm of uncertain behavior of skin: Secondary | ICD-10-CM | POA: Diagnosis not present

## 2017-09-15 DIAGNOSIS — L821 Other seborrheic keratosis: Secondary | ICD-10-CM | POA: Diagnosis not present

## 2017-09-15 DIAGNOSIS — L570 Actinic keratosis: Secondary | ICD-10-CM | POA: Diagnosis not present

## 2017-09-17 ENCOUNTER — Ambulatory Visit (INDEPENDENT_AMBULATORY_CARE_PROVIDER_SITE_OTHER): Payer: Medicare Other | Admitting: Family Medicine

## 2017-09-17 VITALS — BP 132/75 | HR 56 | Temp 98.5°F | Ht 63.0 in | Wt 238.0 lb

## 2017-09-17 DIAGNOSIS — E119 Type 2 diabetes mellitus without complications: Secondary | ICD-10-CM | POA: Diagnosis not present

## 2017-09-17 DIAGNOSIS — I10 Essential (primary) hypertension: Secondary | ICD-10-CM

## 2017-09-17 DIAGNOSIS — Z6841 Body Mass Index (BMI) 40.0 and over, adult: Secondary | ICD-10-CM

## 2017-09-21 ENCOUNTER — Telehealth: Payer: Self-pay | Admitting: Pulmonary Disease

## 2017-09-21 NOTE — Progress Notes (Signed)
Office: [phone]  /  Fax: [phone]   HPI:   Chief Complaint: OBESITY [name] [name] is here to discuss her progress with her obesity treatment plan. She is on the Category 2 plan and is following her eating plan approximately 70 % of the time. She states she is walking 30 minutes 2 times per week. Thereasa celebrated her birthday with caramel pecan cheesecake. She didn't realize she could break up meals and eat them at any point during the day. Her weight is 238 lb (108 kg) today and has had a weight loss of 1 pound over a period of 2 to 3 weeks since her last visit. She has lost 10 lbs since starting treatment with [name].  Diabetes II [name] [name] has a diagnosis of diabetes type II. She is on Tradjenta and Metformin currently. [name] [name] states fasting BGs range between 98 and 115 and post prandial BGs range around 150 approximately. [name] [name] denies any hypoglycemic episodes. Last A1c was at 6.9 She has been working on intensive lifestyle modifications including diet, exercise, and weight loss to help control her blood glucose levels.  Hypertension [name] [name] is a 67 y.o. female with hypertension. She is bradycardic today. [name] [name] denies chest pain, chest pressure or headache. She denies dizziness or lightheadedness. She is working weight loss to help control her blood pressure with the goal of decreasing her risk of heart attack and stroke. [name] [name] blood pressure is currently controlled.  ALLERGIES: Allergies  Allergen Reactions  . Codeine Palpitations    MEDICATIONS: Current Outpatient Medications on File Prior to Visit  Medication Sig Dispense Refill  . ACCU-CHEK AVIVA PLUS test strip U UTD  5  . ACCU-CHEK SOFTCLIX LANCETS lancets U UTD  11  . amLODipine (NORVASC) 10 MG tablet Take 1 tablet (10 mg total) by mouth daily. (Patient taking differently: Take 10 mg by mouth every morning. ) 30 tablet 2  . atorvastatin (LIPITOR) 80 MG tablet Take 1 tablet by mouth every  evening.   5  . benazepril (LOTENSIN) 40 MG tablet Take 40 mg by mouth every evening.     . bisoprolol (ZEBETA) 10 MG tablet Take 10 mg by mouth every morning.     . cetirizine (ZYRTEC) 10 MG tablet Take 10 mg by mouth at bedtime.     . chlorthalidone (HYGROTON) 25 MG tablet Take 25 mg by mouth every morning. 1 table once daily  3  . escitalopram (LEXAPRO) 20 MG tablet Take 20 mg by mouth every evening.     . linagliptin (TRADJENTA) 5 MG TABS tablet Take 5 mg by mouth every morning.     . meloxicam (MOBIC) 15 MG tablet Take 15 mg by mouth every morning.     . metFORMIN (GLUCOPHAGE) 1000 MG tablet Take 1 tablet (1,000 mg total) by mouth 2 (two) times daily with a meal. (Patient taking differently: Take 1,000 mg by mouth 2 (two) times daily with a meal. )    . modafinil (PROVIGIL) 100 MG tablet TAKE 1 TABLET BY MOUTH DAILY 30 tablet 2  . Multiple Vitamin (MULTIVITAMIN) tablet Take 1 tablet by mouth daily.    Marland Kitchen omeprazole (PRILOSEC) 20 MG capsule Take 20 mg by mouth every morning.     . ondansetron (ZOFRAN) 4 MG tablet Take 1 tablet (4 mg total) by mouth every 8 (eight) hours as needed for nausea or vomiting. 10 tablet 0  . Vitamin D, Ergocalciferol, (DRISDOL) 50000 units CAPS capsule Take 1 capsule (50,000 Units total) by mouth  every 7 (seven) days. 4 capsule 0   No current facility-administered medications on file prior to visit.     PAST MEDICAL HISTORY: Past Medical History:  Diagnosis Date  . Carpal tunnel syndrome of right wrist   . Coronary artery disease    per cath 11-29-2013  mild diffuse CAD and calcification in proximal LAD  . Depression   . Diverticulosis   . GERD (gastroesophageal reflux disease)   . History of endometrial cancer 05/2003   FIGO, Grade 1  s/p  TAG w/ BSO  . HTN (hypertension)   . Hyperlipidemia   . OA (osteoarthritis)   . OSA on CPAP    followed by dr Elsworth Soho-- per study 03-26-2012  mod. to severe OSA , AHI 37/hr  . Type II diabetes mellitus ([place])     followed by pcp  . Urinary incontinence in female   . Vitamin D deficiency     PAST SURGICAL HISTORY: Past Surgical History:  Procedure Laterality Date  . CARDIAC CATHETERIZATION  11/29/2013   dr Einar Gip   mild diffuse noncritial CAD and mild calcification involving the proximal LAD,  LVEF 55-60%  . CARPAL TUNNEL RELEASE Right 07/17/2017   Procedure: CARPAL TUNNEL RELEASE;  Surgeon: Dorna Leitz, MD;  Location: [place];  Service: Orthopedics;  Laterality: Right;  . KNEE ARTHROSCOPY Left 10/ 2004;  05-17-2008   dr graves  . KNEE ARTHROSCOPY Right 11/2012  . LAPAROSCOPIC CHOLECYSTECTOMY  11-19-2006  dr Zella Richer  [facility]  . LEFT HEART CATHETERIZATION WITH CORONARY ANGIOGRAM N/A 11/29/2013   Procedure: LEFT HEART CATHETERIZATION WITH CORONARY ANGIOGRAM;  Surgeon: Laverda Page, MD;  Location: [facility] CATH LAB;  Service: Cardiovascular;  Laterality: N/A;  . TOTAL ABDOMINAL HYSTERECTOMY W/ BILATERAL SALPINGOOPHORECTOMY  06-20-2003    dr Mohammed Kindle  [facility]   endometrial cancer  . TOTAL HIP ARTHROPLASTY Right 09/02/2013   Procedure: RIGHT TOTAL HIP ARTHROPLASTY ANTERIOR APPROACH;  Surgeon: Alta Corning, MD;  Location: [place];  Service: Orthopedics;  Laterality: Right;  . TOTAL KNEE ARTHROPLASTY Left 08/02/2012   Procedure: TOTAL KNEE ARTHROPLASTY;  Surgeon: Alta Corning, MD;  Location: [place];  Service: Orthopedics;  Laterality: Left;  left total knee arthroplasty  . TUBAL LIGATION  yrs ago    SOCIAL HISTORY: Social History   Tobacco Use  . Smoking status: Never Smoker  . Smokeless tobacco: Never Used  Substance Use Topics  . Alcohol use: No  . Drug use: No    FAMILY HISTORY: Family History  Problem Relation Age of Onset  . Heart disease Father 2       CABG  . Lung cancer Father 66       lung  . Hypertension Father   . Diabetes Father   . High Cholesterol Father   . Diabetes type II Father   . Heart disease Sister 65       CABG  . Diabetes Sister 33  . Heart disease  Brother 45       MI with stents  . Hypertension Mother   . Diabetes Mother   . High Cholesterol Mother   . Sudden death Mother   . Thyroid disease Mother   . Obesity Mother   . Hypertension Sister        x2   . Diabetes Sister   . Hypertension Brother   . Diabetes Brother   . Cancer Sister        high grade Neuroendocrine carcinoma right axilla  .  Pancreatic cancer Other        x 3 - 2 MAunts and 1 P Aunts.   . Lupus Other        Pat Aunt    ROS: Review of Systems  Constitutional: Positive for weight loss.  Cardiovascular: Negative for chest pain.       Negative for chest pressure  Neurological: Negative for dizziness and headaches.       Negative for lightheadedness  Endo/Heme/Allergies:       Negative for hypoglycemia    PHYSICAL EXAM: Blood pressure 132/75, pulse (!) 56, temperature 98.5 F (36.9 C), temperature source Oral, height 5\' 3"  (1.6 m), weight 238 lb (108 kg), last menstrual period 11/25/2002, SpO2 96 %. Body mass index is 42.16 kg/m. Physical Exam  Constitutional: She is oriented to person, place, and time. She appears well-developed and well-nourished.  Cardiovascular: Normal rate.  Pulmonary/Chest: Effort normal.  Musculoskeletal: Normal range of motion.  Neurological: She is oriented to person, place, and time.  Skin: Skin is warm and dry.  Psychiatric: She has a normal mood and affect. Her behavior is normal.  Vitals reviewed.   RECENT LABS AND TESTS: BMET    Component Value Date/Time   NA 141 07/17/2017 1111   NA 138 04/29/2017 0935   K 4.1 07/17/2017 1111   CL 101 07/17/2017 1111   CO2 28 05/24/2017 1604   GLUCOSE 112 (H) 07/17/2017 1111   BUN 27 (H) 07/17/2017 1111   BUN 28 (H) 04/29/2017 0935   CREATININE 0.80 07/17/2017 1111   CREATININE 1.24 (H) 07/16/2015 1339   CALCIUM 9.9 05/24/2017 1604   GFRNONAA >60 05/24/2017 1604   GFRAA >60 05/24/2017 1604   Lab Results  Component Value Date   HGBA1C 6.9 (H) 04/29/2017   HGBA1C 7.0  (H) 07/16/2015   Lab Results  Component Value Date   INSULIN 36.0 (H) 04/29/2017   CBC    Component Value Date/Time   WBC 8.4 05/24/2017 1604   RBC 4.41 05/24/2017 1604   HGB 12.9 07/17/2017 1111   HGB 13.1 04/29/2017 0935   HGB 11.2 (L) 07/11/2014 1340   HCT 38.0 07/17/2017 1111   HCT 39.4 04/29/2017 0935   PLT 262 05/24/2017 1604   MCV 91.8 05/24/2017 1604   MCV 88 04/29/2017 0935   MCH 29.9 05/24/2017 1604   MCHC 32.6 05/24/2017 1604   RDW 13.7 05/24/2017 1604   RDW 14.2 04/29/2017 0935   LYMPHSABS 1.8 05/24/2017 1604   LYMPHSABS 2.1 04/29/2017 0935   MONOABS 0.7 05/24/2017 1604   EOSABS 0.1 05/24/2017 1604   EOSABS 0.2 04/29/2017 0935   BASOSABS 0.0 05/24/2017 1604   BASOSABS 0.0 04/29/2017 0935   Iron/TIBC/Ferritin/ %Sat No results found for: IRON, TIBC, FERRITIN, IRONPCTSAT Lipid Panel     Component Value Date/Time   CHOL 223 (H) 04/29/2017 0935   TRIG 420 (H) 04/29/2017 0935   HDL 36 (L) 04/29/2017 0935   CHOLHDL 5.9 (H) 07/16/2015 1339   VLDL NOT CALC 07/16/2015 1339   LDLCALC Comment 04/29/2017 0935   Hepatic Function Panel     Component Value Date/Time   PROT 7.8 05/24/2017 1604   PROT 7.3 04/29/2017 0935   ALBUMIN 4.1 05/24/2017 1604   ALBUMIN 4.4 04/29/2017 0935   AST 23 05/24/2017 1604   ALT 31 05/24/2017 1604   ALKPHOS 63 05/24/2017 1604   BILITOT 0.5 05/24/2017 1604   BILITOT 0.3 04/29/2017 0935      Component Value Date/Time   TSH  3.010 04/29/2017 0935   TSH 3.96 07/16/2015 1339   Results for BRIHANY, BUTCH (MRN [id number]) as of 09/21/2017 15:26  Ref. Range 04/29/2017 09:35  Vitamin D, 25-Hydroxy Latest Ref Range: 30.0 - 100.0 ng/mL 29.7 (L)   ASSESSMENT AND PLAN: Type 2 diabetes mellitus without complication, without long-term current use of insulin (HCC)  Essential hypertension  Class 3 severe obesity with serious comorbidity and body mass index (BMI) of 40.0 to 44.9 in adult, unspecified obesity type ([place])  PLAN:  Diabetes  II Mahati has been given extensive diabetes education by myself today including ideal fasting and post-prandial blood glucose readings, individual ideal Hgb A1c goals and hypoglycemia prevention. We discussed the importance of good blood sugar control to decrease the likelihood of diabetic complications such as nephropathy, neuropathy, limb loss, blindness, coronary artery disease, and death. We discussed the importance of intensive lifestyle modification including diet, exercise and weight loss as the first line treatment for diabetes. Tzivia agrees to continue her diabetes medications and will follow up at the agreed upon time.  Hypertension We discussed sodium restriction, working on healthy weight loss, and a regular exercise program as the means to achieve improved blood pressure control. Kimiye agreed with this plan and agreed to follow up as directed. We will continue to monitor her blood pressure as well as her progress with the above lifestyle modifications. She will continue her medications as prescribed and will watch for signs of hypotension as she continues her lifestyle modifications.  We spent > than 50% of the 15 minute visit on the counseling as documented in the note.  Obesity Amaria is currently in the action stage of change. As such, her goal is to continue with weight loss efforts She has agreed to follow the Category 2 plan Ashira has been instructed to work up to a goal of 150 minutes of combined cardio and strengthening exercise per week for weight loss and overall health benefits. We discussed the following Behavioral Modification Strategies today: planning for success, increasing lean protein intake, increasing vegetables and work on meal planning and easy cooking plans  Amayah has agreed to follow up with our clinic in 2 weeks. She was informed of the importance of frequent follow up visits to maximize her success with intensive lifestyle modifications for her  multiple health conditions.   OBESITY BEHAVIORAL INTERVENTION VISIT  Today's visit was # 8 out of 22.  Starting weight: 248 lbs Starting date: 04/29/17 Today's weight : 238 lbs Today's date: 09/17/2017 Total lbs lost to date: 10    ASK: We discussed the diagnosis of obesity with [name] today and Duru agreed to give [name] permission to discuss obesity behavioral modification therapy today.  ASSESS: Moriyah has the diagnosis of obesity and her BMI today is 42.17 Capri is in the action stage of change   ADVISE: Deundra was educated on the multiple health risks of obesity as well as the benefit of weight loss to improve her health. She was advised of the need for long term treatment and the importance of lifestyle modifications.  AGREE: Multiple dietary modification options and treatment options were discussed and  Mende agreed to the above obesity treatment plan.  I, Doreene Nest, am acting as transcriptionist for Eber Jones, MD  I have reviewed the above documentation for accuracy and completeness, and I agree with the above. - Ilene Qua, MD

## 2017-09-21 NOTE — Telephone Encounter (Signed)
PA has been approved until 03/24/18. Patient is aware. Pharmacy is aware as well. Nothing further needed at time of call.

## 2017-09-21 NOTE — Telephone Encounter (Signed)
Spoke with patient. She stated that she received a message from [facility] stating that they will not cover her modafinil. She has requested to have something else called in. Spoke with pharmacist at [facility], they stated that a PA was needed.   Started PA via [email or web address]. Key is [id number]. Will route to myself for follow up.

## 2017-10-01 ENCOUNTER — Other Ambulatory Visit (INDEPENDENT_AMBULATORY_CARE_PROVIDER_SITE_OTHER): Payer: Self-pay | Admitting: Physician Assistant

## 2017-10-01 DIAGNOSIS — E559 Vitamin D deficiency, unspecified: Secondary | ICD-10-CM

## 2017-10-03 DIAGNOSIS — M654 Radial styloid tenosynovitis [de Quervain]: Secondary | ICD-10-CM | POA: Diagnosis not present

## 2017-10-03 DIAGNOSIS — M25532 Pain in left wrist: Secondary | ICD-10-CM | POA: Diagnosis not present

## 2017-10-06 ENCOUNTER — Ambulatory Visit (INDEPENDENT_AMBULATORY_CARE_PROVIDER_SITE_OTHER): Payer: Medicare Other | Admitting: Physician Assistant

## 2017-10-06 VITALS — BP 165/71 | HR 51 | Temp 98.5°F | Ht 63.0 in | Wt 243.0 lb

## 2017-10-06 DIAGNOSIS — E559 Vitamin D deficiency, unspecified: Secondary | ICD-10-CM

## 2017-10-06 DIAGNOSIS — E7849 Other hyperlipidemia: Secondary | ICD-10-CM | POA: Diagnosis not present

## 2017-10-06 DIAGNOSIS — Z6841 Body Mass Index (BMI) 40.0 and over, adult: Secondary | ICD-10-CM

## 2017-10-06 DIAGNOSIS — I10 Essential (primary) hypertension: Secondary | ICD-10-CM | POA: Diagnosis not present

## 2017-10-06 DIAGNOSIS — E119 Type 2 diabetes mellitus without complications: Secondary | ICD-10-CM

## 2017-10-06 MED ORDER — VITAMIN D (ERGOCALCIFEROL) 1.25 MG (50000 UNIT) PO CAPS: 50000.0000 [IU] | ORAL_CAPSULE | ORAL | 0 refills | Status: DC

## 2017-10-06 NOTE — Progress Notes (Signed)
Office: [phone]  /  Fax: [phone]   HPI:   Chief Complaint: OBESITY [name] [name] is here to discuss her progress with her obesity treatment plan. She is on the  Category 2 plan and is following her eating plan approximately 75 % of the time. She states she is walking 45 minutes 3 times per week. [name] [name] struggled with weight loss the past few weeks. She reports eating BBQ and getting slightly off track with eating. She states that she feels like she has put on fluid weight due to changes in her diet. She is ready to get back on track.  Her weight is 243 lb (110.2 kg) today and has not lost weight since her last visit. She has lost 5 lbs since starting treatment with [name].  Diabetes II [name] [name] has a diagnosis of diabetes type II. [name] [name] states BGs have been labile ranging between 95 and 142. She denies any hypoglycemic episodes or polyphagia. Last A1c was 6.9. She has been working on intensive lifestyle modifications including diet, exercise, and weight loss to help control her blood glucose levels.  Hypertension [name] [name] is a 67 y.o. female with hypertension. [name] [name] denies chest pain or shortness of breath on exertion. She is working weight loss to help control her blood pressure with the goal of decreasing her risk of heart attack and stroke. [name] [name]'s blood pressure is elevated today. She did not take her benazepril this morning.  Hyperlipidemia [name] [name] has hyperlipidemia and has been trying to improve her cholesterol levels with intensive lifestyle modification including a low saturated fat diet, exercise and weight loss. She denies any chest pain, claudication or myalgias. She is taking atorvastatin 80mg .   Vitamin D deficiency [name] [name] has a diagnosis of vitamin D deficiency. She is currently taking prescription vit D and denies nausea, vomiting or muscle weakness.   ALLERGIES: Allergies  Allergen Reactions  . Codeine Palpitations     MEDICATIONS: Current Outpatient Medications on File Prior to Visit  Medication Sig Dispense Refill  . ACCU-CHEK AVIVA PLUS test strip U UTD  5  . ACCU-CHEK SOFTCLIX LANCETS lancets U UTD  11  . amLODipine (NORVASC) 10 MG tablet Take 1 tablet (10 mg total) by mouth daily. (Patient taking differently: Take 10 mg by mouth every morning. ) 30 tablet 2  . atorvastatin (LIPITOR) 80 MG tablet Take 1 tablet by mouth every evening.   5  . benazepril (LOTENSIN) 40 MG tablet Take 40 mg by mouth every evening.     . bisoprolol (ZEBETA) 10 MG tablet Take 10 mg by mouth every morning.     . cetirizine (ZYRTEC) 10 MG tablet Take 10 mg by mouth at bedtime.     . chlorthalidone (HYGROTON) 25 MG tablet Take 25 mg by mouth every morning. 1 table once daily  3  . escitalopram (LEXAPRO) 20 MG tablet Take 20 mg by mouth every evening.     . linagliptin (TRADJENTA) 5 MG TABS tablet Take 5 mg by mouth every morning.     . meloxicam (MOBIC) 15 MG tablet Take 15 mg by mouth every morning.     . metFORMIN (GLUCOPHAGE) 1000 MG tablet Take 1 tablet (1,000 mg total) by mouth 2 (two) times daily with a meal. (Patient taking differently: Take 1,000 mg by mouth 2 (two) times daily with a meal. )    . modafinil (PROVIGIL) 100 MG tablet TAKE 1 TABLET BY MOUTH DAILY 30 tablet 2  . Multiple Vitamin (MULTIVITAMIN) tablet Take 1  tablet by mouth daily.    Marland Kitchen omeprazole (PRILOSEC) 20 MG capsule Take 20 mg by mouth every morning.     . ondansetron (ZOFRAN) 4 MG tablet Take 1 tablet (4 mg total) by mouth every 8 (eight) hours as needed for nausea or vomiting. 10 tablet 0   No current facility-administered medications on file prior to visit.     PAST MEDICAL HISTORY: Past Medical History:  Diagnosis Date  . Carpal tunnel syndrome of right wrist   . Coronary artery disease    per cath 11-29-2013  mild diffuse CAD and calcification in proximal LAD  . Depression   . Diverticulosis   . GERD (gastroesophageal reflux disease)    . History of endometrial cancer 05/2003   FIGO, Grade 1  s/p  TAG w/ BSO  . HTN (hypertension)   . Hyperlipidemia   . OA (osteoarthritis)   . OSA on CPAP    followed by dr Elsworth Soho-- per study 03-26-2012  mod. to severe OSA , AHI 37/hr  . Type II diabetes mellitus ([place])    followed by pcp  . Urinary incontinence in female   . Vitamin D deficiency     PAST SURGICAL HISTORY: Past Surgical History:  Procedure Laterality Date  . CARDIAC CATHETERIZATION  11/29/2013   dr Einar Gip   mild diffuse noncritial CAD and mild calcification involving the proximal LAD,  LVEF 55-60%  . CARPAL TUNNEL RELEASE Right 07/17/2017   Procedure: CARPAL TUNNEL RELEASE;  Surgeon: Dorna Leitz, MD;  Location: [place];  Service: Orthopedics;  Laterality: Right;  . KNEE ARTHROSCOPY Left 10/ 2004;  05-17-2008   dr graves  . KNEE ARTHROSCOPY Right 11/2012  . LAPAROSCOPIC CHOLECYSTECTOMY  11-19-2006  dr Zella Richer  [facility]  . LEFT HEART CATHETERIZATION WITH CORONARY ANGIOGRAM N/A 11/29/2013   Procedure: LEFT HEART CATHETERIZATION WITH CORONARY ANGIOGRAM;  Surgeon: Laverda Page, MD;  Location: [facility] CATH LAB;  Service: Cardiovascular;  Laterality: N/A;  . TOTAL ABDOMINAL HYSTERECTOMY W/ BILATERAL SALPINGOOPHORECTOMY  06-20-2003    dr Mohammed Kindle  [facility]   endometrial cancer  . TOTAL HIP ARTHROPLASTY Right 09/02/2013   Procedure: RIGHT TOTAL HIP ARTHROPLASTY ANTERIOR APPROACH;  Surgeon: Alta Corning, MD;  Location: [place];  Service: Orthopedics;  Laterality: Right;  . TOTAL KNEE ARTHROPLASTY Left 08/02/2012   Procedure: TOTAL KNEE ARTHROPLASTY;  Surgeon: Alta Corning, MD;  Location: [place];  Service: Orthopedics;  Laterality: Left;  left total knee arthroplasty  . TUBAL LIGATION  yrs ago    SOCIAL HISTORY: Social History   Tobacco Use  . Smoking status: Never Smoker  . Smokeless tobacco: Never Used  Substance Use Topics  . Alcohol use: No  . Drug use: No    FAMILY HISTORY: Family History  Problem  Relation Age of Onset  . Heart disease Father 72       CABG  . Lung cancer Father 97       lung  . Hypertension Father   . Diabetes Father   . High Cholesterol Father   . Diabetes type II Father   . Heart disease Sister 53       CABG  . Diabetes Sister 93  . Heart disease Brother 70       MI with stents  . Hypertension Mother   . Diabetes Mother   . High Cholesterol Mother   . Sudden death Mother   . Thyroid disease Mother   . Obesity Mother   . Hypertension  Sister        x2   . Diabetes Sister   . Hypertension Brother   . Diabetes Brother   . Cancer Sister        high grade Neuroendocrine carcinoma right axilla  . Pancreatic cancer Other        x 3 - 2 MAunts and 1 P Aunts.   . Lupus Other        Pat Aunt    ROS: Review of Systems  Constitutional: Negative for weight loss.  Respiratory:       Negative for shortness of breath with exertion.  Cardiovascular: Negative for chest pain and claudication.  Gastrointestinal: Negative for nausea and vomiting.  Genitourinary:       Negative for polyphagia.  Musculoskeletal: Negative for myalgias.       Negative for muscle weakness.  Endo/Heme/Allergies:       Negative for hypoglycemia.    PHYSICAL EXAM: Blood pressure (!) 165/71, pulse (!) 51, temperature 98.5 F (36.9 C), temperature source Oral, height 5\' 3"  (1.6 m), weight 243 lb (110.2 kg), last menstrual period 11/25/2002, SpO2 96 %. Body mass index is 43.05 kg/m. Physical Exam  Constitutional: She is oriented to person, place, and time. She appears well-developed and well-nourished.  Cardiovascular: Normal rate.  Pulmonary/Chest: Effort normal.  Musculoskeletal: Normal range of motion.  Neurological: She is oriented to person, place, and time.  Skin: Skin is warm and dry.  Psychiatric: She has a normal mood and affect. Her behavior is normal.  Vitals reviewed.   RECENT LABS AND TESTS: BMET    Component Value Date/Time   NA 141 07/17/2017 1111   NA 138  04/29/2017 0935   K 4.1 07/17/2017 1111   CL 101 07/17/2017 1111   CO2 28 05/24/2017 1604   GLUCOSE 112 (H) 07/17/2017 1111   BUN 27 (H) 07/17/2017 1111   BUN 28 (H) 04/29/2017 0935   CREATININE 0.80 07/17/2017 1111   CREATININE 1.24 (H) 07/16/2015 1339   CALCIUM 9.9 05/24/2017 1604   GFRNONAA >60 05/24/2017 1604   GFRAA >60 05/24/2017 1604   Lab Results  Component Value Date   HGBA1C 6.9 (H) 04/29/2017   HGBA1C 7.0 (H) 07/16/2015   Lab Results  Component Value Date   INSULIN 36.0 (H) 04/29/2017   CBC    Component Value Date/Time   WBC 8.4 05/24/2017 1604   RBC 4.41 05/24/2017 1604   HGB 12.9 07/17/2017 1111   HGB 13.1 04/29/2017 0935   HGB 11.2 (L) 07/11/2014 1340   HCT 38.0 07/17/2017 1111   HCT 39.4 04/29/2017 0935   PLT 262 05/24/2017 1604   MCV 91.8 05/24/2017 1604   MCV 88 04/29/2017 0935   MCH 29.9 05/24/2017 1604   MCHC 32.6 05/24/2017 1604   RDW 13.7 05/24/2017 1604   RDW 14.2 04/29/2017 0935   LYMPHSABS 1.8 05/24/2017 1604   LYMPHSABS 2.1 04/29/2017 0935   MONOABS 0.7 05/24/2017 1604   EOSABS 0.1 05/24/2017 1604   EOSABS 0.2 04/29/2017 0935   BASOSABS 0.0 05/24/2017 1604   BASOSABS 0.0 04/29/2017 0935   Iron/TIBC/Ferritin/ %Sat No results found for: IRON, TIBC, FERRITIN, IRONPCTSAT Lipid Panel     Component Value Date/Time   CHOL 223 (H) 04/29/2017 0935   TRIG 420 (H) 04/29/2017 0935   HDL 36 (L) 04/29/2017 0935   CHOLHDL 5.9 (H) 07/16/2015 1339   VLDL NOT CALC 07/16/2015 1339   LDLCALC Comment 04/29/2017 0935   Hepatic Function Panel  Component Value Date/Time   PROT 7.8 05/24/2017 1604   PROT 7.3 04/29/2017 0935   ALBUMIN 4.1 05/24/2017 1604   ALBUMIN 4.4 04/29/2017 0935   AST 23 05/24/2017 1604   ALT 31 05/24/2017 1604   ALKPHOS 63 05/24/2017 1604   BILITOT 0.5 05/24/2017 1604   BILITOT 0.3 04/29/2017 0935      Component Value Date/Time   TSH 3.010 04/29/2017 0935   TSH 3.96 07/16/2015 1339   Results for ZARINA, PE  (MRN [id number]) as of 10/06/2017 13:58  Ref. Range 04/29/2017 09:35  Vitamin D, 25-Hydroxy Latest Ref Range: 30.0 - 100.0 ng/mL 29.7 (L)   ASSESSMENT AND PLAN: Type 2 diabetes mellitus without complication, without long-term current use of insulin ([place]) - Plan: Comprehensive metabolic panel, Hemoglobin A1c, Insulin, random  Essential hypertension - Plan: Comprehensive metabolic panel  Other hyperlipidemia - Plan: Lipid Panel With LDL/HDL Ratio  Vitamin D deficiency - Plan: VITAMIN D 25 Hydroxy (Vit-D Deficiency, Fractures), Vitamin D, Ergocalciferol, (DRISDOL) 50000 units CAPS capsule  Class 3 severe obesity with serious comorbidity and body mass index (BMI) of 40.0 to 44.9 in adult, unspecified obesity type (HCC)  PLAN:  Diabetes II [name] [name] has been given extensive diabetes education by myself today including ideal fasting and post-prandial blood glucose readings, individual ideal Hgb A1c goals  and hypoglycemia prevention. We discussed the importance of good blood sugar control to decrease the likelihood of diabetic complications such as nephropathy, neuropathy, limb loss, blindness, coronary artery disease, and death. We discussed the importance of intensive lifestyle modification including diet, exercise and weight loss as the first line treatment for diabetes. [name] [name] agrees to continue her diabetes medications, diet, exercise, and weight loss. We will check labs today and she will follow up in 2-3 weeks.  Hypertension We discussed sodium restriction, working on healthy weight loss, and a regular exercise program as the means to achieve improved blood pressure control. [name] [name] agreed with this plan and agreed to follow up as directed. We will continue to monitor her blood pressure as well as her progress with the above lifestyle modifications. She will continue her medications as prescribed and will watch for signs of hypotension as she continues herlifestyle modifications. She will also  continue with diet, exercise, and weight loss. We will check labs today.  Hyperlipidemia [name] [name] was informed of the American Heart Association Guidelines emphasizing intensive lifestyle modifications as the first line treatment for hyperlipidemia. We discussed many lifestyle modifications today in depth, and [name] [name] will continue to work on decreasing saturated fats such as fatty red meat, butter and many fried foods. She will also increase vegetables and lean protein in her diet. She will continue to work on diet, exercise and weight loss efforts. We will check labs today.  Vitamin D Deficiency [name] [name] was informed that low vitamin D levels contributes to fatigue and are associated with obesity, breast, and colon cancer. She agrees to continue to take prescription Vit D @50 ,000 IU every week #4 with no refills and she will follow up in 2 to 3 weeks. We will check labs today. She will follow up for routine testing of vitamin D, at least 2-3 times per year. She was informed of the risk of over-replacement of vitamin D and agrees to not increase her dose unless she discusses this with [name] first.   Obesity [name] [name] is currently in the action stage of change. As such, her goal is to continue with weight loss efforts. She has agreed to follow the Category 2  plan. [name] [name] has been instructed to work up to a goal of 150 minutes of combined cardio and strengthening exercise per week for weight loss and overall health benefits. We discussed the following Behavioral Modification Strategies today: work on meal planning and easy cooking plans and planning for success.  [name] [name] has agreed to follow up with our clinic in 2 to 3 weeks. She was informed of the importance of frequent follow up visits to maximize her success with intensive lifestyle modifications for her multiple health conditions.   OBESITY BEHAVIORAL INTERVENTION VISIT  Today's visit was # 9 out of 22.  Starting weight: 248 lbs Starting  date: 04/29/17 Today's weight : Weight: 243 lb (110.2 kg)  Today's date: 10/06/2017 Total lbs lost to date: 5    ASK: We discussed the diagnosis of obesity with [name] [name] today and [name] [name] agreed to give [name] permission to discuss obesity behavioral modification therapy today.  ASSESS: [name] [name] has the diagnosis of obesity and her BMI today is 43.05 [name] [name] is in the action stage of change.   ADVISE: [name] [name] was educated on the multiple health risks of obesity as well as the benefit of weight loss to improve her health. She was advised of the need for long term treatment and the importance of lifestyle modifications.  AGREE: Multiple dietary modification options and treatment options were discussed and  [name] [name] agreed to the above obesity treatment plan.  Lenward Chancellor, am acting as transcriptionist for Abby Potash, PA-C I, Abby Potash, PA-C have reviewed above note and agree with its content

## 2017-10-07 LAB — COMPREHENSIVE METABOLIC PANEL
ALK PHOS: 56 IU/L (ref 39–117)
ALT: 34 IU/L — ABNORMAL HIGH (ref 0–32)
AST: 22 IU/L (ref 0–40)
Albumin/Globulin Ratio: 1.6 (ref 1.2–2.2)
Albumin: 4.5 g/dL (ref 3.6–4.8)
BUN/Creatinine Ratio: 32 — ABNORMAL HIGH (ref 12–28)
BUN: 26 mg/dL (ref 8–27)
Bilirubin Total: 0.4 mg/dL (ref 0.0–1.2)
CALCIUM: 9.7 mg/dL (ref 8.7–10.3)
CO2: 26 mmol/L (ref 20–29)
CREATININE: 0.81 mg/dL (ref 0.57–1.00)
Chloride: 99 mmol/L (ref 96–106)
GFR calc Af Amer: 88 mL/min/{1.73_m2} (ref >59)
GFR, EST NON AFRICAN AMERICAN: 76 mL/min/{1.73_m2} (ref >59)
GLUCOSE: 89 mg/dL (ref 65–99)
Globulin, Total: 2.8 g/dL (ref 1.5–4.5)
Potassium: 4.8 mmol/L (ref 3.5–5.2)
SODIUM: 141 mmol/L (ref 134–144)
Total Protein: 7.3 g/dL (ref 6.0–8.5)

## 2017-10-07 LAB — LIPID PANEL WITH LDL/HDL RATIO
Cholesterol, Total: 194 mg/dL (ref 100–199)
HDL: 51 mg/dL (ref >39)
LDL Calculated: 90 mg/dL (ref 0–99)
LDL/HDL RATIO: 1.8 ratio (ref 0.0–3.2)
TRIGLYCERIDES: 265 mg/dL — AB (ref 0–149)
VLDL Cholesterol Cal: 53 mg/dL — ABNORMAL HIGH (ref 5–40)

## 2017-10-07 LAB — HEMOGLOBIN A1C
ESTIMATED AVERAGE GLUCOSE: 146 mg/dL
HEMOGLOBIN A1C: 6.7 % — AB (ref 4.8–5.6)

## 2017-10-07 LAB — INSULIN, RANDOM: INSULIN: 24.9 u[IU]/mL (ref 2.6–24.9)

## 2017-10-07 LAB — VITAMIN D 25 HYDROXY (VIT D DEFICIENCY, FRACTURES): Vit D, 25-Hydroxy: 31.1 ng/mL (ref 30.0–100.0)

## 2017-10-08 ENCOUNTER — Encounter: Payer: Self-pay | Admitting: Certified Nurse Midwife

## 2017-10-14 DIAGNOSIS — G4733 Obstructive sleep apnea (adult) (pediatric): Secondary | ICD-10-CM

## 2017-10-16 ENCOUNTER — Telehealth: Payer: Self-pay | Admitting: Pulmonary Disease

## 2017-10-16 NOTE — Telephone Encounter (Signed)
Called and spoke with [name] [name], he states that patient will need a new OV to go over benefit of machine so that it will be covered. Patients last appt was 6 months ago.   Called patient, unable to reach left message to give [name] a call back.

## 2017-10-19 NOTE — Telephone Encounter (Signed)
Patient has been scheduled with Beth for tomorrow at 4pm.   Nothing else needed at time of call.

## 2017-10-20 ENCOUNTER — Encounter: Payer: Self-pay | Admitting: Primary Care

## 2017-10-20 ENCOUNTER — Encounter (INDEPENDENT_AMBULATORY_CARE_PROVIDER_SITE_OTHER): Payer: Self-pay | Admitting: Physician Assistant

## 2017-10-20 ENCOUNTER — Ambulatory Visit (INDEPENDENT_AMBULATORY_CARE_PROVIDER_SITE_OTHER): Payer: Medicare Other | Admitting: Primary Care

## 2017-10-20 ENCOUNTER — Ambulatory Visit (INDEPENDENT_AMBULATORY_CARE_PROVIDER_SITE_OTHER): Payer: Medicare Other | Admitting: Physician Assistant

## 2017-10-20 VITALS — BP 118/66 | HR 59 | Ht 63.0 in | Wt 243.8 lb

## 2017-10-20 VITALS — BP 118/66 | HR 55 | Temp 98.0°F | Ht 63.0 in | Wt 240.0 lb

## 2017-10-20 DIAGNOSIS — G4733 Obstructive sleep apnea (adult) (pediatric): Secondary | ICD-10-CM

## 2017-10-20 DIAGNOSIS — E119 Type 2 diabetes mellitus without complications: Secondary | ICD-10-CM | POA: Diagnosis not present

## 2017-10-20 DIAGNOSIS — Z6841 Body Mass Index (BMI) 40.0 and over, adult: Secondary | ICD-10-CM | POA: Diagnosis not present

## 2017-10-20 MED ORDER — MODAFINIL 100 MG PO TABS: 100.0000 mg | ORAL_TABLET | Freq: Every day | ORAL | 2 refills | Status: DC

## 2017-10-20 NOTE — Progress Notes (Signed)
@Patient  ID: [name] [name], female    DOB: 1951/01/10, 67 y.o.   MRN: [id number]  Chief Complaint  Patient presents with  . Follow-up    CPAP    Referring provider: Vicenta Aly, FNP  HPI: 67 year old female, never smoked. PMH severe sleep apnea. Patient of Dr. Elsworth Soho, last seen in February 2019. Takes Provigil for symptoms r/t excessive daytime somnolence.   Significant tests/ events NPSG 02/2012: AHI 37/hr, desat to 78% Auto 2014: Optimal pressure 16cm. 12/2012: Decrease cpap to 14cm (10 pound weight loss). 05/2016 >>PAP titration  16/12 cm H2O   10/20/2017 Patient presents today needing OSA review. Patient reports that she needs a new CPAP machine, states that it has been making a loud noise that is very distracting and driving her crazy. She has been wearing cpap machine everynight, states that she can not sleep without it. Feels that its beneficial. Reports that she is able to sleep a full night with use and that her breathing is better. States that she has no problem waking up in the moring but becomes tired 1 hour later. She takes provigil as needed, trys not to take it on the weekend but needs it if she is doing something like going to church. Wears cpap mask with naps.  [facility] download reviewed 10/20/17:  Using 87/90 days (97%) 82 days >4hrs Average 6 hours 47 mins Set 16cm H20 AHI 2.5   Allergies  Allergen Reactions  . Codeine Palpitations    Immunization History  Administered Date(s) Administered  . Influenza Split 02/16/2012  . Influenza, High Dose Seasonal PF 11/13/2016  . Influenza, Seasonal, Injecte, Preservative Fre 12/27/2012, 12/14/2015  . Influenza,inj,Quad PF,6+ Mos 12/27/2012, 12/14/2015  . Influenza-Unspecified 11/15/2013, 11/29/2014  . Pneumococcal Conjugate-13 11/13/2016  . Pneumococcal Polysaccharide-23 08/03/2012  . Tdap 07/17/2015    Past Medical History:  Diagnosis Date  . Carpal tunnel syndrome of right wrist   . Coronary  artery disease    per cath 11-29-2013  mild diffuse CAD and calcification in proximal LAD  . Depression   . Diverticulosis   . GERD (gastroesophageal reflux disease)   . History of endometrial cancer 05/2003   FIGO, Grade 1  s/p  TAG w/ BSO  . HTN (hypertension)   . Hyperlipidemia   . OA (osteoarthritis)   . OSA on CPAP    followed by dr Elsworth Soho-- per study 03-26-2012  mod. to severe OSA , AHI 37/hr  . Type II diabetes mellitus ([place])    followed by pcp  . Urinary incontinence in female   . Vitamin D deficiency     Tobacco History: Social History   Tobacco Use  Smoking Status Never Smoker  Smokeless Tobacco Never Used   Counseling given: Not Answered   Outpatient Medications Prior to Visit  Medication Sig Dispense Refill  . ACCU-CHEK AVIVA PLUS test strip U UTD  5  . ACCU-CHEK SOFTCLIX LANCETS lancets U UTD  11  . amLODipine (NORVASC) 10 MG tablet Take 1 tablet (10 mg total) by mouth daily. (Patient taking differently: Take 10 mg by mouth every morning. ) 30 tablet 2  . atorvastatin (LIPITOR) 80 MG tablet Take 1 tablet by mouth every evening.   5  . benazepril (LOTENSIN) 40 MG tablet Take 40 mg by mouth every evening.     . bisoprolol (ZEBETA) 10 MG tablet Take 10 mg by mouth every morning.     . cetirizine (ZYRTEC) 10 MG tablet Take 10 mg by mouth at  bedtime.     . chlorthalidone (HYGROTON) 25 MG tablet Take 25 mg by mouth every morning. 1 table once daily  3  . escitalopram (LEXAPRO) 20 MG tablet Take 20 mg by mouth every evening.     . linagliptin (TRADJENTA) 5 MG TABS tablet Take 5 mg by mouth every morning.     . meloxicam (MOBIC) 15 MG tablet Take 15 mg by mouth every morning.     . metFORMIN (GLUCOPHAGE) 1000 MG tablet Take 1 tablet (1,000 mg total) by mouth 2 (two) times daily with a meal. (Patient taking differently: Take 1,000 mg by mouth 2 (two) times daily with a meal. )    . Multiple Vitamin (MULTIVITAMIN) tablet Take 1 tablet by mouth daily.    Marland Kitchen omeprazole  (PRILOSEC) 20 MG capsule Take 20 mg by mouth every morning.     . Vitamin D, Ergocalciferol, (DRISDOL) 50000 units CAPS capsule Take 1 capsule (50,000 Units total) by mouth every 7 (seven) days. 4 capsule 0  . modafinil (PROVIGIL) 100 MG tablet TAKE 1 TABLET BY MOUTH DAILY 30 tablet 2  . ondansetron (ZOFRAN) 4 MG tablet Take 1 tablet (4 mg total) by mouth every 8 (eight) hours as needed for nausea or vomiting. (Patient not taking: Reported on 10/20/2017) 10 tablet 0   No facility-administered medications prior to visit.     Review of Systems  Review of Systems  Constitutional: Positive for fatigue.  HENT: Negative.   Respiratory: Negative.   Cardiovascular: Negative.   Skin: Negative.   Psychiatric/Behavioral: Positive for sleep disturbance.    Physical Exam  BP 118/66 (BP Location: Right Leg, Cuff Size: Normal)   Pulse (!) 59   Ht 5\' 3"  (1.6 m)   Wt 243 lb 12.8 oz (110.6 kg)   LMP 11/25/2002 (Approximate)   SpO2 96%   BMI 43.19 kg/m  Physical Exam  Constitutional: She is oriented to person, place, and time. She appears well-developed and well-nourished.  HENT:  Head: Normocephalic and atraumatic.  Eyes: Pupils are equal, round, and reactive to light. EOM are normal.  Neck: Normal range of motion. Neck supple.  Cardiovascular: Normal rate, regular rhythm and normal heart sounds.  No murmur heard. Pulmonary/Chest: Effort normal and breath sounds normal. No respiratory distress. She has no wheezes.  Abdominal: Soft. Bowel sounds are normal. There is no tenderness.  Neurological: She is alert and oriented to person, place, and time.  Skin: Skin is warm and dry. No rash noted. No erythema.  Psychiatric: She has a normal mood and affect. Her behavior is normal. Judgment normal.     Lab Results:  CBC    Component Value Date/Time   WBC 8.4 05/24/2017 1604   RBC 4.41 05/24/2017 1604   HGB 12.9 07/17/2017 1111   HGB 13.1 04/29/2017 0935   HGB 11.2 (L) 07/11/2014 1340   HCT  38.0 07/17/2017 1111   HCT 39.4 04/29/2017 0935   PLT 262 05/24/2017 1604   MCV 91.8 05/24/2017 1604   MCV 88 04/29/2017 0935   MCH 29.9 05/24/2017 1604   MCHC 32.6 05/24/2017 1604   RDW 13.7 05/24/2017 1604   RDW 14.2 04/29/2017 0935   LYMPHSABS 1.8 05/24/2017 1604   LYMPHSABS 2.1 04/29/2017 0935   MONOABS 0.7 05/24/2017 1604   EOSABS 0.1 05/24/2017 1604   EOSABS 0.2 04/29/2017 0935   BASOSABS 0.0 05/24/2017 1604   BASOSABS 0.0 04/29/2017 0935    BMET    Component Value Date/Time   NA 141 10/06/2017  1135   K 4.8 10/06/2017 1135   CL 99 10/06/2017 1135   CO2 26 10/06/2017 1135   GLUCOSE 89 10/06/2017 1135   GLUCOSE 112 (H) 07/17/2017 1111   BUN 26 10/06/2017 1135   CREATININE 0.81 10/06/2017 1135   CREATININE 1.24 (H) 07/16/2015 1339   CALCIUM 9.7 10/06/2017 1135   GFRNONAA 76 10/06/2017 1135   GFRAA 88 10/06/2017 1135    BNP No results found for: BNP  ProBNP No results found for: PROBNP  Imaging: No results found.   Assessment & Plan:   Obstructive sleep apnea - Excellent compliance. 82/90 days >4hrs. AHI 2.5 - Reports benefit from cpap use. She is able to sleep throughout the night and has experienced improvement in breathing - DME referral for new CPAP machine     Martyn Ehrich, NP 10/20/2017

## 2017-10-20 NOTE — Assessment & Plan Note (Addendum)
-   Excellent compliance. 82/90 days >4hrs. AHI 2.5 - Reports benefit from cpap use. She is able to sleep throughout the night and has experienced improvement in breathing - DME referral for new CPAP machine

## 2017-10-20 NOTE — Patient Instructions (Signed)
DME referral, new CPAP machine   FU in 6 months

## 2017-10-20 NOTE — Progress Notes (Signed)
Office: [phone]  /  Fax: [phone]   HPI:   Chief Complaint: OBESITY [name] [name] is here to discuss her progress with her obesity treatment plan. She is on the Category 2 plan and is following her eating plan approximately 75 % of the time. She states she is walking 30 minutes 4 times per week. [name] [name] did well with weight loss. She reports doing some snacking during the evening. She is eating an extra apple at times. [name] [name] is requesting her calorie and protein goal, as she is considering journaling. Her weight is 240 lb (108.9 kg) today and has had a weight loss of 3 pounds over a period of 2 weeks since her last visit. She has lost 8 lbs since starting treatment with [name].  Diabetes II [name] [name] has a diagnosis of diabetes type II. [name] [name] states fasting BGs range between 109 and 155 and she denies any hypoglycemic episodes, nausea, vomiting or diarrhea. Last A1c was at 6.7 She has been working on intensive lifestyle modifications including diet, exercise, and weight loss to help control her blood glucose levels.  ALLERGIES: Allergies  Allergen Reactions  . Codeine Palpitations    MEDICATIONS: Current Outpatient Medications on File Prior to Visit  Medication Sig Dispense Refill  . ACCU-CHEK AVIVA PLUS test strip U UTD  5  . ACCU-CHEK SOFTCLIX LANCETS lancets U UTD  11  . amLODipine (NORVASC) 10 MG tablet Take 1 tablet (10 mg total) by mouth daily. (Patient taking differently: Take 10 mg by mouth every morning. ) 30 tablet 2  . atorvastatin (LIPITOR) 80 MG tablet Take 1 tablet by mouth every evening.   5  . benazepril (LOTENSIN) 40 MG tablet Take 40 mg by mouth every evening.     . bisoprolol (ZEBETA) 10 MG tablet Take 10 mg by mouth every morning.     . cetirizine (ZYRTEC) 10 MG tablet Take 10 mg by mouth at bedtime.     . chlorthalidone (HYGROTON) 25 MG tablet Take 25 mg by mouth every morning. 1 table once daily  3  . escitalopram (LEXAPRO) 20 MG tablet Take 20 mg by  mouth every evening.     . linagliptin (TRADJENTA) 5 MG TABS tablet Take 5 mg by mouth every morning.     . meloxicam (MOBIC) 15 MG tablet Take 15 mg by mouth every morning.     . metFORMIN (GLUCOPHAGE) 1000 MG tablet Take 1 tablet (1,000 mg total) by mouth 2 (two) times daily with a meal. (Patient taking differently: Take 1,000 mg by mouth 2 (two) times daily with a meal. )    . Multiple Vitamin (MULTIVITAMIN) tablet Take 1 tablet by mouth daily.    Marland Kitchen omeprazole (PRILOSEC) 20 MG capsule Take 20 mg by mouth every morning.     . ondansetron (ZOFRAN) 4 MG tablet Take 1 tablet (4 mg total) by mouth every 8 (eight) hours as needed for nausea or vomiting. (Patient not taking: Reported on 10/20/2017) 10 tablet 0  . Vitamin D, Ergocalciferol, (DRISDOL) 50000 units CAPS capsule Take 1 capsule (50,000 Units total) by mouth every 7 (seven) days. 4 capsule 0   No current facility-administered medications on file prior to visit.     PAST MEDICAL HISTORY: Past Medical History:  Diagnosis Date  . Carpal tunnel syndrome of right wrist   . Coronary artery disease    per cath 11-29-2013  mild diffuse CAD and calcification in proximal LAD  . Depression   . Diverticulosis   . GERD (gastroesophageal  reflux disease)   . History of endometrial cancer 05/2003   FIGO, Grade 1  s/p  TAG w/ BSO  . HTN (hypertension)   . Hyperlipidemia   . OA (osteoarthritis)   . OSA on CPAP    followed by dr Elsworth Soho-- per study 03-26-2012  mod. to severe OSA , AHI 37/hr  . Type II diabetes mellitus ([place])    followed by pcp  . Urinary incontinence in female   . Vitamin D deficiency     PAST SURGICAL HISTORY: Past Surgical History:  Procedure Laterality Date  . CARDIAC CATHETERIZATION  11/29/2013   dr Einar Gip   mild diffuse noncritial CAD and mild calcification involving the proximal LAD,  LVEF 55-60%  . CARPAL TUNNEL RELEASE Right 07/17/2017   Procedure: CARPAL TUNNEL RELEASE;  Surgeon: Dorna Leitz, MD;  Location: [facility];  Service: Orthopedics;  Laterality: Right;  . KNEE ARTHROSCOPY Left 10/ 2004;  05-17-2008   dr graves  . KNEE ARTHROSCOPY Right 11/2012  . LAPAROSCOPIC CHOLECYSTECTOMY  11-19-2006  dr Zella Richer  [facility]  . LEFT HEART CATHETERIZATION WITH CORONARY ANGIOGRAM N/A 11/29/2013   Procedure: LEFT HEART CATHETERIZATION WITH CORONARY ANGIOGRAM;  Surgeon: Laverda Page, MD;  Location: [facility] CATH LAB;  Service: Cardiovascular;  Laterality: N/A;  . TOTAL ABDOMINAL HYSTERECTOMY W/ BILATERAL SALPINGOOPHORECTOMY  06-20-2003    dr Mohammed Kindle  [facility]   endometrial cancer  . TOTAL HIP ARTHROPLASTY Right 09/02/2013   Procedure: RIGHT TOTAL HIP ARTHROPLASTY ANTERIOR APPROACH;  Surgeon: Alta Corning, MD;  Location: [place];  Service: Orthopedics;  Laterality: Right;  . TOTAL KNEE ARTHROPLASTY Left 08/02/2012   Procedure: TOTAL KNEE ARTHROPLASTY;  Surgeon: Alta Corning, MD;  Location: [place];  Service: Orthopedics;  Laterality: Left;  left total knee arthroplasty  . TUBAL LIGATION  yrs ago    SOCIAL HISTORY: Social History   Tobacco Use  . Smoking status: Never Smoker  . Smokeless tobacco: Never Used  Substance Use Topics  . Alcohol use: No  . Drug use: No    FAMILY HISTORY: Family History  Problem Relation Age of Onset  . Heart disease Father 16       CABG  . Lung cancer Father 51       lung  . Hypertension Father   . Diabetes Father   . High Cholesterol Father   . Diabetes type II Father   . Heart disease Sister 86       CABG  . Diabetes Sister 8  . Heart disease Brother 2       MI with stents  . Hypertension Mother   . Diabetes Mother   . High Cholesterol Mother   . Sudden death Mother   . Thyroid disease Mother   . Obesity Mother   . Hypertension Sister        x2   . Diabetes Sister   . Hypertension Brother   . Diabetes Brother   . Cancer Sister        high grade Neuroendocrine carcinoma right axilla  . Pancreatic cancer Other        x 3 - 2 MAunts and 1 P Aunts.   .  Lupus Other        Pat Aunt    ROS: Review of Systems  Constitutional: Positive for weight loss.  Gastrointestinal: Negative for diarrhea, nausea and vomiting.  Endo/Heme/Allergies:       Negative for hypoglycemia    PHYSICAL EXAM: Blood pressure  118/66, pulse (!) 55, temperature 98 F (36.7 C), temperature source Oral, height 5\' 3"  (1.6 m), weight 240 lb (108.9 kg), last menstrual period 11/25/2002, SpO2 96 %. Body mass index is 42.51 kg/m. Physical Exam  Constitutional: She is oriented to person, place, and time. She appears well-developed and well-nourished.  Cardiovascular: Normal rate.  Pulmonary/Chest: Effort normal.  Musculoskeletal: Normal range of motion.  Neurological: She is oriented to person, place, and time.  Skin: Skin is warm and dry.  Psychiatric: She has a normal mood and affect. Her behavior is normal.  Vitals reviewed.   RECENT LABS AND TESTS: BMET    Component Value Date/Time   NA 141 10/06/2017 1135   K 4.8 10/06/2017 1135   CL 99 10/06/2017 1135   CO2 26 10/06/2017 1135   GLUCOSE 89 10/06/2017 1135   GLUCOSE 112 (H) 07/17/2017 1111   BUN 26 10/06/2017 1135   CREATININE 0.81 10/06/2017 1135   CREATININE 1.24 (H) 07/16/2015 1339   CALCIUM 9.7 10/06/2017 1135   GFRNONAA 76 10/06/2017 1135   GFRAA 88 10/06/2017 1135   Lab Results  Component Value Date   HGBA1C 6.7 (H) 10/06/2017   HGBA1C 6.9 (H) 04/29/2017   HGBA1C 7.0 (H) 07/16/2015   Lab Results  Component Value Date   INSULIN 24.9 10/06/2017   INSULIN 36.0 (H) 04/29/2017   CBC    Component Value Date/Time   WBC 8.4 05/24/2017 1604   RBC 4.41 05/24/2017 1604   HGB 12.9 07/17/2017 1111   HGB 13.1 04/29/2017 0935   HGB 11.2 (L) 07/11/2014 1340   HCT 38.0 07/17/2017 1111   HCT 39.4 04/29/2017 0935   PLT 262 05/24/2017 1604   MCV 91.8 05/24/2017 1604   MCV 88 04/29/2017 0935   MCH 29.9 05/24/2017 1604   MCHC 32.6 05/24/2017 1604   RDW 13.7 05/24/2017 1604   RDW 14.2 04/29/2017  0935   LYMPHSABS 1.8 05/24/2017 1604   LYMPHSABS 2.1 04/29/2017 0935   MONOABS 0.7 05/24/2017 1604   EOSABS 0.1 05/24/2017 1604   EOSABS 0.2 04/29/2017 0935   BASOSABS 0.0 05/24/2017 1604   BASOSABS 0.0 04/29/2017 0935   Iron/TIBC/Ferritin/ %Sat No results found for: IRON, TIBC, FERRITIN, IRONPCTSAT Lipid Panel     Component Value Date/Time   CHOL 194 10/06/2017 1135   TRIG 265 (H) 10/06/2017 1135   HDL 51 10/06/2017 1135   CHOLHDL 5.9 (H) 07/16/2015 1339   VLDL NOT CALC 07/16/2015 1339   LDLCALC 90 10/06/2017 1135   Hepatic Function Panel     Component Value Date/Time   PROT 7.3 10/06/2017 1135   ALBUMIN 4.5 10/06/2017 1135   AST 22 10/06/2017 1135   ALT 34 (H) 10/06/2017 1135   ALKPHOS 56 10/06/2017 1135   BILITOT 0.4 10/06/2017 1135      Component Value Date/Time   TSH 3.010 04/29/2017 0935   TSH 3.96 07/16/2015 1339   Results for NEYSHA, CRIADO (MRN [id number]) as of 10/20/2017 16:55  Ref. Range 10/06/2017 11:35  Vitamin D, 25-Hydroxy Latest Ref Range: 30.0 - 100.0 ng/mL 31.1   ASSESSMENT AND PLAN: Type 2 diabetes mellitus without complication, without long-term current use of insulin (HCC)  Class 3 severe obesity with serious comorbidity and body mass index (BMI) of 40.0 to 44.9 in adult, unspecified obesity type ([place])  PLAN:  Diabetes II [name] [name] has been given extensive diabetes education by myself today including ideal fasting and post-prandial blood glucose readings, individual ideal Hgb A1c goals and hypoglycemia prevention. We  discussed the importance of good blood sugar control to decrease the likelihood of diabetic complications such as nephropathy, neuropathy, limb loss, blindness, coronary artery disease, and death. We discussed the importance of intensive lifestyle modification including diet, exercise and weight loss as the first line treatment for diabetes. [name] [name] agrees to continue her diabetes medications, diet and exercise and will follow up at  the agreed upon time.  Obesity [name] [name] is currently in the action stage of change. As such, her goal is to continue with weight loss efforts She has agreed to follow the Category 2 plan [name] [name] has been instructed to work up to a goal of 150 minutes of combined cardio and strengthening exercise per week for weight loss and overall health benefits. We discussed the following Behavioral Modification Strategies today: decreasing simple carbohydrates  and work on meal planning and easy cooking plans  [name] [name] has agreed to follow up with our clinic in 2 to 3 weeks. She was informed of the importance of frequent follow up visits to maximize her success with intensive lifestyle modifications for her multiple health conditions.   OBESITY BEHAVIORAL INTERVENTION VISIT  Today's visit was # 10   Starting weight: 248 lbs Starting date: 04/29/17 Today's weight : 240 lbs  Today's date: 10/20/2017 Total lbs lost to date: 8 At least 15 minutes were spent on discussing the following behavioral intervention visit.   ASK: We discussed the diagnosis of obesity with [name] [name] today and [name] [name] agreed to give [name] permission to discuss obesity behavioral modification therapy today.  ASSESS: [name] [name] has the diagnosis of obesity and her BMI today is 42.52 [name] [name] is in the action stage of change   ADVISE: [name] [name] was educated on the multiple health risks of obesity as well as the benefit of weight loss to improve her health. She was advised of the need for long term treatment and the importance of lifestyle modifications to improve her current health and to decrease her risk of future health problems.  AGREE: Multiple dietary modification options and treatment options were discussed and  [name] [name] agreed to follow the recommendations documented in the above note.  ARRANGE: [name] [name] was educated on the importance of frequent visits to treat obesity as outlined per CMS and USPSTF guidelines and  agreed to schedule her next follow up appointment today.  Corey Skains, am acting as transcriptionist for Abby Potash, PA-C I, Abby Potash, PA-C have reviewed above note and agree with its content

## 2017-10-27 DIAGNOSIS — M25531 Pain in right wrist: Secondary | ICD-10-CM | POA: Diagnosis not present

## 2017-10-28 DIAGNOSIS — C44722 Squamous cell carcinoma of skin of right lower limb, including hip: Secondary | ICD-10-CM | POA: Diagnosis not present

## 2017-10-28 DIAGNOSIS — L905 Scar conditions and fibrosis of skin: Secondary | ICD-10-CM | POA: Diagnosis not present

## 2017-11-10 ENCOUNTER — Ambulatory Visit (INDEPENDENT_AMBULATORY_CARE_PROVIDER_SITE_OTHER): Payer: Medicare Other | Admitting: Physician Assistant

## 2017-11-10 VITALS — BP 146/69 | HR 53 | Temp 98.1°F | Ht 63.0 in | Wt 239.0 lb

## 2017-11-10 DIAGNOSIS — Z6841 Body Mass Index (BMI) 40.0 and over, adult: Secondary | ICD-10-CM | POA: Diagnosis not present

## 2017-11-10 DIAGNOSIS — E119 Type 2 diabetes mellitus without complications: Secondary | ICD-10-CM | POA: Diagnosis not present

## 2017-11-10 DIAGNOSIS — E559 Vitamin D deficiency, unspecified: Secondary | ICD-10-CM

## 2017-11-10 MED ORDER — VITAMIN D (ERGOCALCIFEROL) 1.25 MG (50000 UNIT) PO CAPS: 50000.0000 [IU] | ORAL_CAPSULE | ORAL | 0 refills | Status: DC

## 2017-11-11 NOTE — Progress Notes (Signed)
Office: [phone]  /  Fax: [phone]   HPI:   Chief Complaint: OBESITY [name] [name] is here to discuss her progress with her obesity treatment plan. She is on the Category 2 plan and is following her eating plan approximately 50 % of the time. She states she is exercising 0 minutes 0 times per week. [name] [name] did very well with weight loss. She reports that she has been making smart choices even when going out to eat. She is traveling out of town next week.  Her weight is 239 lb (108.4 kg) today and has had a weight loss of 1 pound over a period of 3 weeks since her last visit. She has lost 9 lbs since starting treatment with [name].  Vitamin D Deficiency [name] [name] has a diagnosis of vitamin D deficiency. She is on prescription Vit D and denies nausea, vomiting or muscle weakness.  Diabetes II [name] [name] has a diagnosis of diabetes type II. [name] [name] states fasting BGs range between 113 and 120. She is on metformin and denies hypoglycemia or polyphagia. Last A1c was 6.7. She has been working on intensive lifestyle modifications including diet, exercise, and weight loss to help control her blood glucose levels.  ALLERGIES: Allergies  Allergen Reactions  . Codeine Palpitations    MEDICATIONS: Current Outpatient Medications on File Prior to Visit  Medication Sig Dispense Refill  . ACCU-CHEK AVIVA PLUS test strip U UTD  5  . ACCU-CHEK SOFTCLIX LANCETS lancets U UTD  11  . amLODipine (NORVASC) 10 MG tablet Take 1 tablet (10 mg total) by mouth daily. (Patient taking differently: Take 10 mg by mouth every morning. ) 30 tablet 2  . atorvastatin (LIPITOR) 80 MG tablet Take 1 tablet by mouth every evening.   5  . benazepril (LOTENSIN) 40 MG tablet Take 40 mg by mouth every evening.     . bisoprolol (ZEBETA) 10 MG tablet Take 10 mg by mouth every morning.     . cetirizine (ZYRTEC) 10 MG tablet Take 10 mg by mouth at bedtime.     . chlorthalidone (HYGROTON) 25 MG tablet Take 25 mg by mouth every  morning. 1 table once daily  3  . escitalopram (LEXAPRO) 20 MG tablet Take 20 mg by mouth every evening.     . linagliptin (TRADJENTA) 5 MG TABS tablet Take 5 mg by mouth every morning.     . meloxicam (MOBIC) 15 MG tablet Take 15 mg by mouth every morning.     . metFORMIN (GLUCOPHAGE) 1000 MG tablet Take 1 tablet (1,000 mg total) by mouth 2 (two) times daily with a meal. (Patient taking differently: Take 1,000 mg by mouth 2 (two) times daily with a meal. )    . modafinil (PROVIGIL) 100 MG tablet Take 1 tablet (100 mg total) by mouth daily. 30 tablet 2  . Multiple Vitamin (MULTIVITAMIN) tablet Take 1 tablet by mouth daily.    Marland Kitchen omeprazole (PRILOSEC) 20 MG capsule Take 20 mg by mouth every morning.     . ondansetron (ZOFRAN) 4 MG tablet Take 1 tablet (4 mg total) by mouth every 8 (eight) hours as needed for nausea or vomiting. 10 tablet 0   No current facility-administered medications on file prior to visit.     PAST MEDICAL HISTORY: Past Medical History:  Diagnosis Date  . Carpal tunnel syndrome of right wrist   . Coronary artery disease    per cath 11-29-2013  mild diffuse CAD and calcification in proximal LAD  . Depression   .  Diverticulosis   . GERD (gastroesophageal reflux disease)   . History of endometrial cancer 05/2003   FIGO, Grade 1  s/p  TAG w/ BSO  . HTN (hypertension)   . Hyperlipidemia   . OA (osteoarthritis)   . OSA on CPAP    followed by dr Elsworth Soho-- per study 03-26-2012  mod. to severe OSA , AHI 37/hr  . Type II diabetes mellitus ([place])    followed by pcp  . Urinary incontinence in female   . Vitamin D deficiency     PAST SURGICAL HISTORY: Past Surgical History:  Procedure Laterality Date  . CARDIAC CATHETERIZATION  11/29/2013   dr Einar Gip   mild diffuse noncritial CAD and mild calcification involving the proximal LAD,  LVEF 55-60%  . CARPAL TUNNEL RELEASE Right 07/17/2017   Procedure: CARPAL TUNNEL RELEASE;  Surgeon: Dorna Leitz, MD;  Location: [facility];  Service: Orthopedics;  Laterality: Right;  . KNEE ARTHROSCOPY Left 10/ 2004;  05-17-2008   dr graves  . KNEE ARTHROSCOPY Right 11/2012  . LAPAROSCOPIC CHOLECYSTECTOMY  11-19-2006  dr Zella Richer  [facility]  . LEFT HEART CATHETERIZATION WITH CORONARY ANGIOGRAM N/A 11/29/2013   Procedure: LEFT HEART CATHETERIZATION WITH CORONARY ANGIOGRAM;  Surgeon: Laverda Page, MD;  Location: [facility] CATH LAB;  Service: Cardiovascular;  Laterality: N/A;  . TOTAL ABDOMINAL HYSTERECTOMY W/ BILATERAL SALPINGOOPHORECTOMY  06-20-2003    dr Mohammed Kindle  [facility]   endometrial cancer  . TOTAL HIP ARTHROPLASTY Right 09/02/2013   Procedure: RIGHT TOTAL HIP ARTHROPLASTY ANTERIOR APPROACH;  Surgeon: Alta Corning, MD;  Location: [place];  Service: Orthopedics;  Laterality: Right;  . TOTAL KNEE ARTHROPLASTY Left 08/02/2012   Procedure: TOTAL KNEE ARTHROPLASTY;  Surgeon: Alta Corning, MD;  Location: [place];  Service: Orthopedics;  Laterality: Left;  left total knee arthroplasty  . TUBAL LIGATION  yrs ago    SOCIAL HISTORY: Social History   Tobacco Use  . Smoking status: Never Smoker  . Smokeless tobacco: Never Used  Substance Use Topics  . Alcohol use: No  . Drug use: No    FAMILY HISTORY: Family History  Problem Relation Age of Onset  . Heart disease Father 64       CABG  . Lung cancer Father 72       lung  . Hypertension Father   . Diabetes Father   . High Cholesterol Father   . Diabetes type II Father   . Heart disease Sister 17       CABG  . Diabetes Sister 11  . Heart disease Brother 41       MI with stents  . Hypertension Mother   . Diabetes Mother   . High Cholesterol Mother   . Sudden death Mother   . Thyroid disease Mother   . Obesity Mother   . Hypertension Sister        x2   . Diabetes Sister   . Hypertension Brother   . Diabetes Brother   . Cancer Sister        high grade Neuroendocrine carcinoma right axilla  . Pancreatic cancer Other        x 3 - 2 MAunts and 1 P Aunts.   . Lupus Other         Pat Aunt    ROS: Review of Systems  Constitutional: Positive for weight loss.  Gastrointestinal: Negative for nausea and vomiting.  Musculoskeletal:       Negative muscle weakness  Endo/Heme/Allergies:  Negative hypoglycemia Negative polyphagia    PHYSICAL EXAM: Blood pressure (!) 146/69, pulse (!) 53, temperature 98.1 F (36.7 C), temperature source Oral, height 5\' 3"  (1.6 m), weight 239 lb (108.4 kg), last menstrual period 11/25/2002, SpO2 96 %. Body mass index is 42.34 kg/m. Physical Exam  Constitutional: She is oriented to person, place, and time. She appears well-developed and well-nourished.  Cardiovascular: Normal rate.  Pulmonary/Chest: Effort normal.  Musculoskeletal: Normal range of motion.  Neurological: She is oriented to person, place, and time.  Skin: Skin is warm and dry.  Psychiatric: She has a normal mood and affect. Her behavior is normal.  Vitals reviewed.   RECENT LABS AND TESTS: BMET    Component Value Date/Time   NA 141 10/06/2017 1135   K 4.8 10/06/2017 1135   CL 99 10/06/2017 1135   CO2 26 10/06/2017 1135   GLUCOSE 89 10/06/2017 1135   GLUCOSE 112 (H) 07/17/2017 1111   BUN 26 10/06/2017 1135   CREATININE 0.81 10/06/2017 1135   CREATININE 1.24 (H) 07/16/2015 1339   CALCIUM 9.7 10/06/2017 1135   GFRNONAA 76 10/06/2017 1135   GFRAA 88 10/06/2017 1135   Lab Results  Component Value Date   HGBA1C 6.7 (H) 10/06/2017   HGBA1C 6.9 (H) 04/29/2017   HGBA1C 7.0 (H) 07/16/2015   Lab Results  Component Value Date   INSULIN 24.9 10/06/2017   INSULIN 36.0 (H) 04/29/2017   CBC    Component Value Date/Time   WBC 8.4 05/24/2017 1604   RBC 4.41 05/24/2017 1604   HGB 12.9 07/17/2017 1111   HGB 13.1 04/29/2017 0935   HGB 11.2 (L) 07/11/2014 1340   HCT 38.0 07/17/2017 1111   HCT 39.4 04/29/2017 0935   PLT 262 05/24/2017 1604   MCV 91.8 05/24/2017 1604   MCV 88 04/29/2017 0935   MCH 29.9 05/24/2017 1604   MCHC 32.6 05/24/2017 1604     RDW 13.7 05/24/2017 1604   RDW 14.2 04/29/2017 0935   LYMPHSABS 1.8 05/24/2017 1604   LYMPHSABS 2.1 04/29/2017 0935   MONOABS 0.7 05/24/2017 1604   EOSABS 0.1 05/24/2017 1604   EOSABS 0.2 04/29/2017 0935   BASOSABS 0.0 05/24/2017 1604   BASOSABS 0.0 04/29/2017 0935   Iron/TIBC/Ferritin/ %Sat No results found for: IRON, TIBC, FERRITIN, IRONPCTSAT Lipid Panel     Component Value Date/Time   CHOL 194 10/06/2017 1135   TRIG 265 (H) 10/06/2017 1135   HDL 51 10/06/2017 1135   CHOLHDL 5.9 (H) 07/16/2015 1339   VLDL NOT CALC 07/16/2015 1339   LDLCALC 90 10/06/2017 1135   Hepatic Function Panel     Component Value Date/Time   PROT 7.3 10/06/2017 1135   ALBUMIN 4.5 10/06/2017 1135   AST 22 10/06/2017 1135   ALT 34 (H) 10/06/2017 1135   ALKPHOS 56 10/06/2017 1135   BILITOT 0.4 10/06/2017 1135      Component Value Date/Time   TSH 3.010 04/29/2017 0935   TSH 3.96 07/16/2015 1339  Results for ALLAINA, BROTZMAN (MRN [id number]) as of 11/11/2017 08:32  Ref. Range 10/06/2017 11:35  Vitamin D, 25-Hydroxy Latest Ref Range: 30.0 - 100.0 ng/mL 31.1    ASSESSMENT AND PLAN: Vitamin D deficiency - Plan: Vitamin D, Ergocalciferol, (DRISDOL) 50000 units CAPS capsule  Type 2 diabetes mellitus without complication, without long-term current use of insulin (HCC)  Class 3 severe obesity with serious comorbidity and body mass index (BMI) of 40.0 to 44.9 in adult, unspecified obesity type ([place])  PLAN:  Vitamin D  Deficiency [name] [name] was informed that low vitamin D levels contributes to fatigue and are associated with obesity, breast, and colon cancer. [name] [name] agrees to continue taking prescription Vit D @50 ,000 IU every week #4 and we will refill for 1 month. She will follow up for routine testing of vitamin D, at least 2-3 times per year. She was informed of the risk of over-replacement of vitamin D and agrees to not increase her dose unless she discusses this with [name] first. [name] [name] agrees to  follow up with our clinic in 3 weeks.  Diabetes II [name] [name] has been given extensive diabetes education by myself today including ideal fasting and post-prandial blood glucose readings, individual ideal Hgb A1c goals and hypoglycemia prevention. We discussed the importance of good blood sugar control to decrease the likelihood of diabetic complications such as nephropathy, neuropathy, limb loss, blindness, coronary artery disease, and death. We discussed the importance of intensive lifestyle modification including diet, exercise and weight loss as the first line treatment for diabetes. [name] [name] agrees to continue taking metformin, and continue with weight loss. [name] [name] agrees to follow up with our clinic in 3 weeks.  Obesity [name] [name] is currently in the action stage of change. As such, her goal is to continue with weight loss efforts She has agreed to follow the Category 2 plan Amayia has been instructed to work up to a goal of 150 minutes of combined cardio and strengthening exercise per week for weight loss and overall health benefits. We discussed the following Behavioral Modification Strategies today: work on meal planning and easy cooking plans and travel eating strategies    [name] [name] has agreed to follow up with our clinic in 3 weeks. She was informed of the importance of frequent follow up visits to maximize her success with intensive lifestyle modifications for her multiple health conditions.   OBESITY BEHAVIORAL INTERVENTION VISIT  Today's visit was # 11  Starting weight: 248 lbs Starting date: 04/29/17 Today's weight : 239 lbs  Today's date: 11/10/2017 Total lbs lost to date: 9 At least 15 minutes were spent on discussing the following behavioral intervention visit.   ASK: We discussed the diagnosis of obesity with [name] [name] today and [name] [name] agreed to give [name] permission to discuss obesity behavioral modification therapy today.  ASSESS: [name] [name] has the diagnosis of  obesity and her BMI today is 42.35 [name] [name] is in the action stage of change   ADVISE: [name] [name] was educated on the multiple health risks of obesity as well as the benefit of weight loss to improve her health. She was advised of the need for long term treatment and the importance of lifestyle modifications.  AGREE: Multiple dietary modification options and treatment options were discussed and  [name] [name] agreed to the above obesity treatment plan.  [name] [name], am acting as transcriptionist for Abby Potash, PA-C I, Abby Potash, PA-C have reviewed above note and agree with its content

## 2017-11-13 DIAGNOSIS — G4733 Obstructive sleep apnea (adult) (pediatric): Secondary | ICD-10-CM | POA: Diagnosis not present

## 2017-11-13 DIAGNOSIS — I10 Essential (primary) hypertension: Secondary | ICD-10-CM | POA: Diagnosis not present

## 2017-11-13 DIAGNOSIS — Z23 Encounter for immunization: Secondary | ICD-10-CM | POA: Diagnosis not present

## 2017-11-13 DIAGNOSIS — E782 Mixed hyperlipidemia: Secondary | ICD-10-CM | POA: Diagnosis not present

## 2017-11-13 DIAGNOSIS — N183 Chronic kidney disease, stage 3 (moderate): Secondary | ICD-10-CM | POA: Diagnosis not present

## 2017-11-13 DIAGNOSIS — E119 Type 2 diabetes mellitus without complications: Secondary | ICD-10-CM | POA: Diagnosis not present

## 2017-12-01 ENCOUNTER — Encounter (INDEPENDENT_AMBULATORY_CARE_PROVIDER_SITE_OTHER): Payer: Self-pay | Admitting: Physician Assistant

## 2017-12-01 ENCOUNTER — Ambulatory Visit (INDEPENDENT_AMBULATORY_CARE_PROVIDER_SITE_OTHER): Payer: Medicare Other | Admitting: Physician Assistant

## 2017-12-01 VITALS — BP 137/76 | HR 54 | Temp 98.2°F | Ht 63.0 in | Wt 243.0 lb

## 2017-12-01 DIAGNOSIS — E119 Type 2 diabetes mellitus without complications: Secondary | ICD-10-CM | POA: Diagnosis not present

## 2017-12-01 DIAGNOSIS — Z6841 Body Mass Index (BMI) 40.0 and over, adult: Secondary | ICD-10-CM | POA: Diagnosis not present

## 2017-12-01 DIAGNOSIS — E559 Vitamin D deficiency, unspecified: Secondary | ICD-10-CM

## 2017-12-01 MED ORDER — VITAMIN D (ERGOCALCIFEROL) 1.25 MG (50000 UNIT) PO CAPS: 50000.0000 [IU] | ORAL_CAPSULE | ORAL | 0 refills | Status: DC

## 2017-12-01 MED ORDER — METFORMIN HCL 1000 MG PO TABS: 1000.0000 mg | ORAL_TABLET | Freq: Two times a day (BID) | ORAL | 0 refills | Status: DC

## 2017-12-02 NOTE — Progress Notes (Signed)
Office: [phone]  /  Fax: [phone]   HPI:   Chief Complaint: OBESITY [name] [name] is here to discuss her progress with her obesity treatment plan. She is on the Category 2 plan and is following her eating plan approximately 80 % of the time. She states she is exercising 0 minutes 0 times per week. 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 She is ready to get back on track.  Her weight is 243 lb (110.2 kg) today and has gained 4 pounds since her last visit. She has lost 5 lbs since starting treatment with [name].  Diabetes II [name] [name] has a diagnosis of diabetes type II. [name] [name] states fasting BGs range between 99 and 120 and she denies hypoglycemia or polyphagia. She is on metformin and Tradjenta and denies nausea, vomiting, or diarrhea. Last A1c was 6.7. She has been working on intensive lifestyle modifications including diet, exercise, and weight loss to help control her blood glucose levels.  Vitamin D Deficiency [name] [name] has a diagnosis of vitamin D deficiency. She is on prescription Vit D and denies nausea, vomiting or muscle weakness.  ALLERGIES: Allergies  Allergen Reactions  . Codeine Palpitations    MEDICATIONS: Current Outpatient Medications on File Prior to Visit  Medication Sig Dispense Refill  . ACCU-CHEK AVIVA PLUS test strip U UTD  5  . ACCU-CHEK SOFTCLIX LANCETS lancets U UTD  11  . amLODipine (NORVASC) 10 MG tablet Take 1 tablet (10 mg total) by mouth daily. (Patient taking differently: Take 10 mg by mouth every morning. ) 30 tablet 2  . atorvastatin (LIPITOR) 80 MG tablet Take 1 tablet by mouth every evening.   5  . benazepril (LOTENSIN) 40 MG tablet Take 40 mg by mouth every evening.     . bisoprolol (ZEBETA) 10 MG tablet Take 10 mg by mouth every morning.     . cetirizine (ZYRTEC) 10 MG tablet Take 10 mg by mouth at bedtime.     . chlorthalidone (HYGROTON) 25 MG tablet Take  25 mg by mouth every morning. 1 table once daily  3  . escitalopram (LEXAPRO) 20 MG tablet Take 20 mg by mouth every evening.     . linagliptin (TRADJENTA) 5 MG TABS tablet Take 5 mg by mouth every morning.     . meloxicam (MOBIC) 15 MG tablet Take 15 mg by mouth every morning.     . modafinil (PROVIGIL) 100 MG tablet Take 1 tablet (100 mg total) by mouth daily. 30 tablet 2  . Multiple Vitamin (MULTIVITAMIN) tablet Take 1 tablet by mouth daily.    [name] [name] Kitchen omeprazole (PRILOSEC) 20 MG capsule Take 20 mg by mouth every morning.     . ondansetron (ZOFRAN) 4 MG tablet Take 1 tablet (4 mg total) by mouth every 8 (eight) hours as needed for nausea or vomiting. 10 tablet 0   No current facility-administered medications on file prior to visit.     PAST MEDICAL HISTORY: Past Medical History:  Diagnosis Date  . Carpal tunnel syndrome of right wrist   . Coronary artery disease    per cath 11-29-2013  mild diffuse CAD and calcification in proximal LAD  . Depression   . Diverticulosis   . GERD (gastroesophageal reflux disease)   . History of endometrial cancer 05/2003   FIGO, Grade 1  s/p  TAG w/ BSO  . HTN (hypertension)   . Hyperlipidemia   .  OA (osteoarthritis)   . OSA on CPAP    followed by dr Elsworth Soho-- per study 03-26-2012  mod. to severe OSA , AHI 37/hr  . Type II diabetes mellitus ([place])    followed by pcp  . Urinary incontinence in female   . Vitamin D deficiency     PAST SURGICAL HISTORY: Past Surgical History:  Procedure Laterality Date  . CARDIAC CATHETERIZATION  11/29/2013   dr Einar Gip   mild diffuse noncritial CAD and mild calcification involving the proximal LAD,  LVEF 55-60%  . CARPAL TUNNEL RELEASE Right 07/17/2017   Procedure: CARPAL TUNNEL RELEASE;  Surgeon: Dorna Leitz, MD;  Location: [facility];  Service: Orthopedics;  Laterality: Right;  . KNEE ARTHROSCOPY Left 10/ 2004;  05-17-2008   dr graves  . KNEE ARTHROSCOPY Right 11/2012  . LAPAROSCOPIC CHOLECYSTECTOMY   11-19-2006  dr Zella Richer  [facility]  . LEFT HEART CATHETERIZATION WITH CORONARY ANGIOGRAM N/A 11/29/2013   Procedure: LEFT HEART CATHETERIZATION WITH CORONARY ANGIOGRAM;  Surgeon: Laverda Page, MD;  Location: [facility] CATH LAB;  Service: Cardiovascular;  Laterality: N/A;  . TOTAL ABDOMINAL HYSTERECTOMY W/ BILATERAL SALPINGOOPHORECTOMY  06-20-2003    dr Mohammed Kindle  [facility]   endometrial cancer  . TOTAL HIP ARTHROPLASTY Right 09/02/2013   Procedure: RIGHT TOTAL HIP ARTHROPLASTY ANTERIOR APPROACH;  Surgeon: Alta Corning, MD;  Location: [facility];  Service: Orthopedics;  Laterality: Right;  . TOTAL KNEE ARTHROPLASTY Left 08/02/2012   Procedure: TOTAL KNEE ARTHROPLASTY;  Surgeon: Alta Corning, MD;  Location: [place];  Service: Orthopedics;  Laterality: Left;  left total knee arthroplasty  . TUBAL LIGATION  yrs ago    SOCIAL HISTORY: Social History   Tobacco Use  . Smoking status: Never Smoker  . Smokeless tobacco: Never Used  Substance Use Topics  . Alcohol use: No  . Drug use: No    FAMILY HISTORY: Family History  Problem Relation Age of Onset  . Heart disease Father 85       CABG  . Lung cancer Father 38       lung  . Hypertension Father   . Diabetes Father   . High Cholesterol Father   . Diabetes type II Father   . Heart disease Sister 39       CABG  . Diabetes Sister 23  . Heart disease Brother 74       MI with stents  . Hypertension Mother   . Diabetes Mother   . High Cholesterol Mother   . Sudden death Mother   . Thyroid disease Mother   . Obesity Mother   . Hypertension Sister        x2   . Diabetes Sister   . Hypertension Brother   . Diabetes Brother   . Cancer Sister        high grade Neuroendocrine carcinoma right axilla  . Pancreatic cancer Other        x 3 - 2 MAunts and 1 P Aunts.   . Lupus Other        Pat Aunt    ROS: Review of Systems  Constitutional: Negative for weight loss.  Gastrointestinal: Negative for diarrhea, nausea and vomiting.  Musculoskeletal:        Negative muscle weakness  Endo/Heme/Allergies:       Negative hypoglycemia Negative polyphagia    PHYSICAL EXAM: Blood pressure 137/76, pulse (!) 54, temperature 98.2 F (36.8 C), temperature source Oral, height 5\' 3"  (1.6 m), weight 243 lb (  110.2 kg), last menstrual period 11/25/2002, SpO2 96 %. Body mass index is 43.05 kg/m. Physical Exam  Constitutional: She is oriented to person, place, and time. She appears well-developed and well-nourished.  Cardiovascular: Normal rate.  Pulmonary/Chest: Effort normal.  Musculoskeletal: Normal range of motion.  Neurological: She is oriented to person, place, and time.  Skin: Skin is warm and dry.  Psychiatric: She has a normal mood and affect. Her behavior is normal.  Vitals reviewed.   RECENT LABS AND TESTS: BMET    Component Value Date/Time   NA 141 10/06/2017 1135   K 4.8 10/06/2017 1135   CL 99 10/06/2017 1135   CO2 26 10/06/2017 1135   GLUCOSE 89 10/06/2017 1135   GLUCOSE 112 (H) 07/17/2017 1111   BUN 26 10/06/2017 1135   CREATININE 0.81 10/06/2017 1135   CREATININE 1.24 (H) 07/16/2015 1339   CALCIUM 9.7 10/06/2017 1135   GFRNONAA 76 10/06/2017 1135   GFRAA 88 10/06/2017 1135   Lab Results  Component Value Date   HGBA1C 6.7 (H) 10/06/2017   HGBA1C 6.9 (H) 04/29/2017   HGBA1C 7.0 (H) 07/16/2015   Lab Results  Component Value Date   INSULIN 24.9 10/06/2017   INSULIN 36.0 (H) 04/29/2017   CBC    Component Value Date/Time   WBC 8.4 05/24/2017 1604   RBC 4.41 05/24/2017 1604   HGB 12.9 07/17/2017 1111   HGB 13.1 04/29/2017 0935   HGB 11.2 (L) 07/11/2014 1340   HCT 38.0 07/17/2017 1111   HCT 39.4 04/29/2017 0935   PLT 262 05/24/2017 1604   MCV 91.8 05/24/2017 1604   MCV 88 04/29/2017 0935   MCH 29.9 05/24/2017 1604   MCHC 32.6 05/24/2017 1604   RDW 13.7 05/24/2017 1604   RDW 14.2 04/29/2017 0935   LYMPHSABS 1.8 05/24/2017 1604   LYMPHSABS 2.1 04/29/2017 0935   MONOABS 0.7 05/24/2017 1604   EOSABS 0.1  05/24/2017 1604   EOSABS 0.2 04/29/2017 0935   BASOSABS 0.0 05/24/2017 1604   BASOSABS 0.0 04/29/2017 0935   Iron/TIBC/Ferritin/ %Sat No results found for: IRON, TIBC, FERRITIN, IRONPCTSAT Lipid Panel     Component Value Date/Time   CHOL 194 10/06/2017 1135   TRIG 265 (H) 10/06/2017 1135   HDL 51 10/06/2017 1135   CHOLHDL 5.9 (H) 07/16/2015 1339   VLDL NOT CALC 07/16/2015 1339   LDLCALC 90 10/06/2017 1135   Hepatic Function Panel     Component Value Date/Time   PROT 7.3 10/06/2017 1135   ALBUMIN 4.5 10/06/2017 1135   AST 22 10/06/2017 1135   ALT 34 (H) 10/06/2017 1135   ALKPHOS 56 10/06/2017 1135   BILITOT 0.4 10/06/2017 1135      Component Value Date/Time   TSH 3.010 04/29/2017 0935   TSH 3.96 07/16/2015 1339  Results for BENJAMIN, MERRIHEW (MRN [id number]) as of 12/02/2017 16:14  Ref. Range 10/06/2017 11:35  Vitamin D, 25-Hydroxy Latest Ref Range: 30.0 - 100.0 ng/mL 31.1    ASSESSMENT AND PLAN: Type 2 diabetes mellitus without complication, without long-term current use of insulin (HCC) - Plan: metFORMIN (GLUCOPHAGE) 1000 MG tablet  Vitamin D deficiency - Plan: Vitamin D, Ergocalciferol, (DRISDOL) 50000 units CAPS capsule  Class 3 severe obesity with serious comorbidity and body mass index (BMI) of 40.0 to 44.9 in adult, unspecified obesity type ([place])  PLAN:  Diabetes II Khira has been given extensive diabetes education by myself today including ideal fasting and post-prandial blood glucose readings, individual ideal Hgb A1c goals and hypoglycemia prevention.  We discussed the importance of good blood sugar control to decrease the likelihood of diabetic complications such as nephropathy, neuropathy, limb loss, blindness, coronary artery disease, and death. We discussed the importance of intensive lifestyle modification including diet, exercise and weight loss as the first line treatment for diabetes. [name] [name] agrees to continue taking metformin 1,000 mg BID # 60 and we  will refill for 1 month. [name] [name] agrees to follow up with our clinic in 3 weeks.  Vitamin D Deficiency [name] [name] was informed that low vitamin D levels contributes to fatigue and are associated with obesity, breast, and colon cancer. [name] [name] agrees to continue taking prescription Vit D @50 ,000 IU every week #4 and we will refill for 1 month. She will follow up for routine testing of vitamin D, at least 2-3 times per year. She was informed of the risk of over-replacement of vitamin D and agrees to not increase her dose unless she discusses this with [name] first. Airelle agrees to follow up with our clinic in 3 weeks.  Obesity [name] [name] is currently in the action stage of change. As such, her goal is to continue with weight loss efforts She has agreed to follow the Category 2 plan Lasean has been instructed to work up to a goal of 150 minutes of combined cardio and strengthening exercise per week for weight loss and overall health benefits. We discussed the following Behavioral Modification Strategies today: decrease eating out and work on meal planning and easy cooking plans   Marlana has agreed to follow up with our clinic in 3 weeks. She was informed of the importance of frequent follow up visits to maximize her success with intensive lifestyle modifications for her multiple health conditions.   OBESITY BEHAVIORAL INTERVENTION VISIT  Today's visit was # 12   Starting weight: 248 lbs Starting date: 04/29/17 Today's weight : 243 lbs  Today's date: 12/01/2017 Total lbs lost to date: 5 At least 15 minutes were spent on discussing the following behavioral intervention visit.   ASK: We discussed the diagnosis of obesity with [name] [name] today and [name] [name] agreed to give [name] permission to discuss obesity behavioral modification therapy today.  ASSESS: [name] [name] has the diagnosis of obesity and her BMI today is 43.06 [name] [name] is in the action stage of change   ADVISE: [name] [name] was  educated on the multiple health risks of obesity as well as the benefit of weight loss to improve her health. She was advised of the need for long term treatment and the importance of lifestyle modifications.  AGREE: Multiple dietary modification options and treatment options were discussed and  [name] [name] agreed to the above obesity treatment plan.  [name] [name], am acting as transcriptionist for Abby Potash, PA-C I, Abby Potash, PA-C have reviewed above note and agree with its content

## 2017-12-03 DIAGNOSIS — M25531 Pain in right wrist: Secondary | ICD-10-CM | POA: Diagnosis not present

## 2017-12-03 DIAGNOSIS — M25532 Pain in left wrist: Secondary | ICD-10-CM | POA: Diagnosis not present

## 2017-12-07 ENCOUNTER — Ambulatory Visit: Payer: Medicare Other | Admitting: Adult Health

## 2017-12-10 ENCOUNTER — Other Ambulatory Visit: Payer: Self-pay | Admitting: Pulmonary Disease

## 2017-12-11 ENCOUNTER — Ambulatory Visit (INDEPENDENT_AMBULATORY_CARE_PROVIDER_SITE_OTHER): Payer: Medicare Other | Admitting: Adult Health

## 2017-12-11 ENCOUNTER — Encounter: Payer: Self-pay | Admitting: Adult Health

## 2017-12-11 DIAGNOSIS — G4733 Obstructive sleep apnea (adult) (pediatric): Secondary | ICD-10-CM

## 2017-12-11 DIAGNOSIS — G4710 Hypersomnia, unspecified: Secondary | ICD-10-CM | POA: Diagnosis not present

## 2017-12-11 NOTE — Progress Notes (Signed)
@Patient  ID: [name] [name], female    DOB: 05-Jul-1950, 67 y.o.   MRN: [id number]  Chief Complaint  Patient presents with  . Follow-up    OSA    Referring provider: Vicenta Aly, FNP  HPI: 67 year old female, never smoked. PMH severe sleep apnea.Takes Provigil for symptoms r/t excessive daytime somnolence.   Significant tests/ events NPSG 02/2012: AHI 37/hr, desat to 78% Auto 2014: Optimal pressure 16cm. 12/2012: Decrease cpap to 14cm (10 pound weight loss). 05/2016 >>PAP titration 16/12 cm H2O  12/11/2017 Follow up : OSA , Hypersomnia  Patient returns for follow-up for sleep apnea.  Patient says she is doing well on CPAP.  Feels that she benefits .  Download shows excellent compliance with average usage at 7 hours.  Patient is on CPAP 16 cm H2O.  AHI 4.0.  Minimal leaks.  Does have chronic daytime sleepiness, takes Provigil which helps . Denies headche or chest pain .    Allergies  Allergen Reactions  . Codeine Palpitations    Immunization History  Administered Date(s) Administered  . Influenza Split 02/16/2012  . Influenza, High Dose Seasonal PF 11/13/2016, 11/23/2017  . Influenza, Seasonal, Injecte, Preservative Fre 12/27/2012, 12/14/2015  . Influenza,inj,Quad PF,6+ Mos 12/27/2012, 12/14/2015  . Influenza-Unspecified 11/15/2013, 11/29/2014  . Pneumococcal Conjugate-13 11/13/2016  . Pneumococcal Polysaccharide-23 08/03/2012  . Tdap 07/17/2015    Past Medical History:  Diagnosis Date  . Carpal tunnel syndrome of right wrist   . Coronary artery disease    per cath 11-29-2013  mild diffuse CAD and calcification in proximal LAD  . Depression   . Diverticulosis   . GERD (gastroesophageal reflux disease)   . History of endometrial cancer 05/2003   FIGO, Grade 1  s/p  TAG w/ BSO  . HTN (hypertension)   . Hyperlipidemia   . OA (osteoarthritis)   . OSA on CPAP    followed by dr Elsworth Soho-- per study 03-26-2012  mod. to severe OSA , AHI 37/hr  . Type II  diabetes mellitus ([place])    followed by pcp  . Urinary incontinence in female   . Vitamin D deficiency     Tobacco History: Social History   Tobacco Use  Smoking Status Never Smoker  Smokeless Tobacco Never Used   Counseling given: Not Answered   Outpatient Medications Prior to Visit  Medication Sig Dispense Refill  . ACCU-CHEK AVIVA PLUS test strip U UTD  5  . ACCU-CHEK SOFTCLIX LANCETS lancets U UTD  11  . amLODipine (NORVASC) 10 MG tablet Take 1 tablet (10 mg total) by mouth daily. (Patient taking differently: Take 10 mg by mouth every morning. ) 30 tablet 2  . atorvastatin (LIPITOR) 80 MG tablet Take 1 tablet by mouth every evening.   5  . benazepril (LOTENSIN) 40 MG tablet Take 40 mg by mouth every evening.     . bisoprolol (ZEBETA) 10 MG tablet Take 10 mg by mouth every morning.     . cetirizine (ZYRTEC) 10 MG tablet Take 10 mg by mouth at bedtime.     . chlorthalidone (HYGROTON) 25 MG tablet Take 25 mg by mouth every morning. 1 table once daily  3  . escitalopram (LEXAPRO) 20 MG tablet Take 20 mg by mouth every evening.     . linagliptin (TRADJENTA) 5 MG TABS tablet Take 5 mg by mouth every morning.     . meloxicam (MOBIC) 15 MG tablet Take 15 mg by mouth every morning.     Marland Kitchen  metFORMIN (GLUCOPHAGE) 1000 MG tablet Take 1 tablet (1,000 mg total) by mouth 2 (two) times daily with a meal. 60 tablet 0  . modafinil (PROVIGIL) 100 MG tablet TAKE 1 TABLET BY MOUTH EVERY DAY 30 tablet 2  . Multiple Vitamin (MULTIVITAMIN) tablet Take 1 tablet by mouth daily.    Marland Kitchen omeprazole (PRILOSEC) 20 MG capsule Take 20 mg by mouth every morning.     . Vitamin D, Ergocalciferol, (DRISDOL) 50000 units CAPS capsule Take 1 capsule (50,000 Units total) by mouth every 7 (seven) days. 4 capsule 0  . ondansetron (ZOFRAN) 4 MG tablet Take 1 tablet (4 mg total) by mouth every 8 (eight) hours as needed for nausea or vomiting. (Patient not taking: Reported on 12/11/2017) 10 tablet 0   No facility-administered  medications prior to visit.      Review of Systems  Constitutional:   No  weight loss, night sweats,  Fevers, chills, fatigue, or  lassitude.  HEENT:   No headaches,  Difficulty swallowing,  Tooth/dental problems, or  Sore throat,                No sneezing, itching, ear ache, nasal congestion, post nasal drip,   CV:  No chest pain,  Orthopnea, PND, swelling in lower extremities, anasarca, dizziness, palpitations, syncope.   GI  No heartburn, indigestion, abdominal pain, nausea, vomiting, diarrhea, change in bowel habits, loss of appetite, bloody stools.   Resp: No shortness of breath with exertion or at rest.  No excess mucus, no productive cough,  No non-productive cough,  No coughing up of blood.  No change in color of mucus.  No wheezing.  No chest wall deformity  Skin: no rash or lesions.  GU: no dysuria, change in color of urine, no urgency or frequency.  No flank pain, no hematuria   MS:  No joint pain or swelling.  No decreased range of motion.  No back pain.    Physical Exam  BP 124/60 (BP Location: Left Arm, Cuff Size: Large)   Pulse (!) 56   Ht 5\' 3"  (1.6 m)   Wt 243 lb 12.8 oz (110.6 kg)   LMP 11/25/2002 (Approximate)   SpO2 97%   BMI 43.19 kg/m   GEN: A/Ox3; pleasant , NAD, obese    HEENT:  [place]/AT,  EACs-clear, TMs-wnl, NOSE-clear, THROAT-clear, no lesions, no postnasal drip or exudate noted. Class 2-3 MP airway   NECK:  Supple w/ fair ROM; no JVD; normal carotid impulses w/o bruits; no thyromegaly or nodules palpated; no lymphadenopathy.    RESP  Clear  P & A; w/o, wheezes/ rales/ or rhonchi. no accessory muscle use, no dullness to percussion  CARD:  RRR, no m/r/g, no peripheral edema, pulses intact, no cyanosis or clubbing.  GI:   Soft & nt; nml bowel sounds; no organomegaly or masses detected.   Musco: Warm bil, no deformities or joint swelling noted.   Neuro: alert, no focal deficits noted.    Skin: Warm, no lesions or rashes    Lab  Results:  CBC  BMET  BNP No results found for: BNP  ProBNP No results found for: PROBNP  Imaging: No results found.   Assessment & Plan:   No problem-specific Assessment & Plan notes found for this encounter.     Rexene Edison, NP 12/11/2017

## 2017-12-11 NOTE — Patient Instructions (Signed)
Continue on CPAP At bedtime   Keep up good work  Work on [employer] loss  Do not drive if sleepy .  Continue on Provigil .  Follow up Dr. Elsworth Soho  Or Hildur Bayer NP in 1 year and As needed

## 2017-12-14 NOTE — Assessment & Plan Note (Signed)
Doing well on Provigil  No changes

## 2017-12-14 NOTE — Assessment & Plan Note (Signed)
Controlled on CPAP doing very well   Plan  Patient Instructions  Continue on CPAP At bedtime   Keep up good work  Work on weight loss  Do not drive if sleepy .  Continue on Provigil .  Follow up Dr. Elsworth Soho  Or Kree Armato NP in 1 year and As needed

## 2017-12-15 DIAGNOSIS — L570 Actinic keratosis: Secondary | ICD-10-CM | POA: Diagnosis not present

## 2017-12-15 NOTE — Telephone Encounter (Signed)
Called pt's pharmacy to check to see if an Rx for modafinil 100mg  was received 12/11/17.  Spoke with Beth at [facility] to see if the med had been received and per [facility], a new Rx of modafinil had not been received.  Pt saw TP 12/11/17 for an OV and the Rx was printed out by Braulio Conte on 12/11/17 with Dr. Bari Mantis name on it.  Per pt, she does not have an Rx of the modafinil and pt is wanting to know if we can take care of getting this request handled for her.  Dr. Elsworth Soho, please advise if you are okay with [name] printing out another Rx of the modafinil 100mg  for you to sign once you return back to the office Thursday, 10/23. Thanks!

## 2017-12-16 NOTE — Telephone Encounter (Signed)
OK to provide Please check with TP to sign since it was during her office visit

## 2017-12-17 MED ORDER — MODAFINIL 100 MG PO TABS: 100.0000 mg | ORAL_TABLET | Freq: Every day | ORAL | 2 refills | Status: DC

## 2017-12-22 ENCOUNTER — Ambulatory Visit (INDEPENDENT_AMBULATORY_CARE_PROVIDER_SITE_OTHER): Payer: Medicare Other | Admitting: Physician Assistant

## 2017-12-22 ENCOUNTER — Encounter (INDEPENDENT_AMBULATORY_CARE_PROVIDER_SITE_OTHER): Payer: Self-pay | Admitting: Physician Assistant

## 2017-12-22 VITALS — BP 144/74 | HR 54 | Temp 98.1°F | Ht 63.0 in | Wt 243.0 lb

## 2017-12-22 DIAGNOSIS — E119 Type 2 diabetes mellitus without complications: Secondary | ICD-10-CM | POA: Diagnosis not present

## 2017-12-22 DIAGNOSIS — Z6841 Body Mass Index (BMI) 40.0 and over, adult: Secondary | ICD-10-CM

## 2017-12-22 NOTE — Progress Notes (Signed)
Office: [phone]  /  Fax: [phone]   HPI:   Chief Complaint: OBESITY [name] [name] is here to discuss her progress with her obesity treatment plan. She is on the  follow the Category 2 plan and is following her eating plan approximately 75 % of the time. She states she is exercising 10 minutes 7 times per week.[name] [name] reports that she has been eating halloween candy and other simple carbs. She reports lack of appetite at lunch. She is ready to get back on track.   Her weight is 243 lb (110.2 kg) today and maintained since her last visit. She has lost 5 lbs since starting treatment with [name] [name].  Diabetes II [name] [name] has a diagnosis of diabetes type II. [name] [name] states BGs range between 92 and 174 and denies any hypoglycemic episodes. Last A1c was Hemoglobin A1C Latest Ref Rng & Units 10/06/2017 04/29/2017  HGBA1C 4.8 - 5.6 % 6.7(H) 6.9(H)  Some recent data might be hidden    She has been working on intensive lifestyle modifications including diet, exercise, and weight loss to help control her blood glucose levels.   ALLERGIES: Allergies  Allergen Reactions  . Codeine Palpitations    MEDICATIONS: Current Outpatient Medications on File Prior to Visit  Medication Sig Dispense Refill  . ACCU-CHEK AVIVA PLUS test strip U UTD  5  . ACCU-CHEK SOFTCLIX LANCETS lancets U UTD  11  . amLODipine (NORVASC) 10 MG tablet Take 1 tablet (10 mg total) by mouth daily. (Patient taking differently: Take 10 mg by mouth every morning. ) 30 tablet 2  . atorvastatin (LIPITOR) 80 MG tablet Take 1 tablet by mouth every evening.   5  . benazepril (LOTENSIN) 40 MG tablet Take 40 mg by mouth every evening.     . bisoprolol (ZEBETA) 10 MG tablet Take 10 mg by mouth every morning.     . cetirizine (ZYRTEC) 10 MG tablet Take 10 mg by mouth at bedtime.     . chlorthalidone (HYGROTON) 25 MG tablet Take 25 mg by mouth every morning. 1 table once daily  3  . escitalopram (LEXAPRO) 20 MG tablet Take 20 mg by mouth  every evening.     . linagliptin (TRADJENTA) 5 MG TABS tablet Take 5 mg by mouth every morning.     . meloxicam (MOBIC) 15 MG tablet Take 15 mg by mouth every morning.     . metFORMIN (GLUCOPHAGE) 1000 MG tablet Take 1 tablet (1,000 mg total) by mouth 2 (two) times daily with a meal. 60 tablet 0  . modafinil (PROVIGIL) 100 MG tablet Take 1 tablet (100 mg total) by mouth daily. 30 tablet 2  . Multiple Vitamin (MULTIVITAMIN) tablet Take 1 tablet by mouth daily.    Marland Kitchen omeprazole (PRILOSEC) 20 MG capsule Take 20 mg by mouth every morning.     . ondansetron (ZOFRAN) 4 MG tablet Take 1 tablet (4 mg total) by mouth every 8 (eight) hours as needed for nausea or vomiting. 10 tablet 0  . Vitamin D, Ergocalciferol, (DRISDOL) 50000 units CAPS capsule Take 1 capsule (50,000 Units total) by mouth every 7 (seven) days. 4 capsule 0   No current facility-administered medications on file prior to visit.     PAST MEDICAL HISTORY: Past Medical History:  Diagnosis Date  . Carpal tunnel syndrome of right wrist   . Coronary artery disease    per cath 11-29-2013  mild diffuse CAD and calcification in proximal LAD  . Depression   . Diverticulosis   .  GERD (gastroesophageal reflux disease)   . History of endometrial cancer 05/2003   FIGO, Grade 1  s/p  TAG w/ BSO  . HTN (hypertension)   . Hyperlipidemia   . OA (osteoarthritis)   . OSA on CPAP    followed by dr Elsworth Soho-- per study 03-26-2012  mod. to severe OSA , AHI 37/hr  . Type II diabetes mellitus ([place])    followed by pcp  . Urinary incontinence in female   . Vitamin D deficiency     PAST SURGICAL HISTORY: Past Surgical History:  Procedure Laterality Date  . CARDIAC CATHETERIZATION  11/29/2013   dr Einar Gip   mild diffuse noncritial CAD and mild calcification involving the proximal LAD,  LVEF 55-60%  . CARPAL TUNNEL RELEASE Right 07/17/2017   Procedure: CARPAL TUNNEL RELEASE;  Surgeon: Dorna Leitz, MD;  Location: [place];  Service:  Orthopedics;  Laterality: Right;  . KNEE ARTHROSCOPY Left 10/ 2004;  05-17-2008   dr graves  . KNEE ARTHROSCOPY Right 11/2012  . LAPAROSCOPIC CHOLECYSTECTOMY  11-19-2006  dr Zella Richer  [facility]  . LEFT HEART CATHETERIZATION WITH CORONARY ANGIOGRAM N/A 11/29/2013   Procedure: LEFT HEART CATHETERIZATION WITH CORONARY ANGIOGRAM;  Surgeon: Laverda Page, MD;  Location: [facility] CATH LAB;  Service: Cardiovascular;  Laterality: N/A;  . TOTAL ABDOMINAL HYSTERECTOMY W/ BILATERAL SALPINGOOPHORECTOMY  06-20-2003    dr Mohammed Kindle  [facility]   endometrial cancer  . TOTAL HIP ARTHROPLASTY Right 09/02/2013   Procedure: RIGHT TOTAL HIP ARTHROPLASTY ANTERIOR APPROACH;  Surgeon: Alta Corning, MD;  Location: [place];  Service: Orthopedics;  Laterality: Right;  . TOTAL KNEE ARTHROPLASTY Left 08/02/2012   Procedure: TOTAL KNEE ARTHROPLASTY;  Surgeon: Alta Corning, MD;  Location: [place];  Service: Orthopedics;  Laterality: Left;  left total knee arthroplasty  . TUBAL LIGATION  yrs ago    SOCIAL HISTORY: Social History   Tobacco Use  . Smoking status: Never Smoker  . Smokeless tobacco: Never Used  Substance Use Topics  . Alcohol use: No  . Drug use: No    FAMILY HISTORY: Family History  Problem Relation Age of Onset  . Heart disease Father 63       CABG  . Lung cancer Father 42       lung  . Hypertension Father   . Diabetes Father   . High Cholesterol Father   . Diabetes type II Father   . Heart disease Sister 8       CABG  . Diabetes Sister 31  . Heart disease Brother 25       MI with stents  . Hypertension Mother   . Diabetes Mother   . High Cholesterol Mother   . Sudden death Mother   . Thyroid disease Mother   . Obesity Mother   . Hypertension Sister        x2   . Diabetes Sister   . Hypertension Brother   . Diabetes Brother   . Cancer Sister        high grade Neuroendocrine carcinoma right axilla  . Pancreatic cancer Other        x 3 - 2 MAunts and 1 P Aunts.   . Lupus Other        Pat Aunt      ROS: Review of Systems  All other systems reviewed and are negative.   PHYSICAL EXAM: Blood pressure (!) 144/74, pulse (!) 54, temperature 98.1 F (36.7 C), temperature source Oral, height 5'  3" (1.6 m), weight 243 lb (110.2 kg), last menstrual period 11/25/2002, SpO2 97 %. Body mass index is 43.05 kg/m. Physical Exam  Constitutional: She appears well-developed and well-nourished.  HENT:  Head: Normocephalic.  Eyes: Pupils are equal, round, and reactive to light.  Neck: Normal range of motion.  Pulmonary/Chest: Effort normal.  Musculoskeletal: Normal range of motion.  Skin: Skin is warm and dry.  Psychiatric: She has a normal mood and affect. Her behavior is normal.  Vitals reviewed.   RECENT LABS AND TESTS: BMET    Component Value Date/Time   NA 141 10/06/2017 1135   K 4.8 10/06/2017 1135   CL 99 10/06/2017 1135   CO2 26 10/06/2017 1135   GLUCOSE 89 10/06/2017 1135   GLUCOSE 112 (H) 07/17/2017 1111   BUN 26 10/06/2017 1135   CREATININE 0.81 10/06/2017 1135   CREATININE 1.24 (H) 07/16/2015 1339   CALCIUM 9.7 10/06/2017 1135   GFRNONAA 76 10/06/2017 1135   GFRAA 88 10/06/2017 1135   Lab Results  Component Value Date   HGBA1C 6.7 (H) 10/06/2017   HGBA1C 6.9 (H) 04/29/2017   HGBA1C 7.0 (H) 07/16/2015   Lab Results  Component Value Date   INSULIN 24.9 10/06/2017   INSULIN 36.0 (H) 04/29/2017   CBC    Component Value Date/Time   WBC 8.4 05/24/2017 1604   RBC 4.41 05/24/2017 1604   HGB 12.9 07/17/2017 1111   HGB 13.1 04/29/2017 0935   HGB 11.2 (L) 07/11/2014 1340   HCT 38.0 07/17/2017 1111   HCT 39.4 04/29/2017 0935   PLT 262 05/24/2017 1604   MCV 91.8 05/24/2017 1604   MCV 88 04/29/2017 0935   MCH 29.9 05/24/2017 1604   MCHC 32.6 05/24/2017 1604   RDW 13.7 05/24/2017 1604   RDW 14.2 04/29/2017 0935   LYMPHSABS 1.8 05/24/2017 1604   LYMPHSABS 2.1 04/29/2017 0935   MONOABS 0.7 05/24/2017 1604   EOSABS 0.1 05/24/2017 1604   EOSABS 0.2 04/29/2017  0935   BASOSABS 0.0 05/24/2017 1604   BASOSABS 0.0 04/29/2017 0935   Iron/TIBC/Ferritin/ %Sat No results found for: IRON, TIBC, FERRITIN, IRONPCTSAT Lipid Panel     Component Value Date/Time   CHOL 194 10/06/2017 1135   TRIG 265 (H) 10/06/2017 1135   HDL 51 10/06/2017 1135   CHOLHDL 5.9 (H) 07/16/2015 1339   VLDL NOT CALC 07/16/2015 1339   LDLCALC 90 10/06/2017 1135   Hepatic Function Panel     Component Value Date/Time   PROT 7.3 10/06/2017 1135   ALBUMIN 4.5 10/06/2017 1135   AST 22 10/06/2017 1135   ALT 34 (H) 10/06/2017 1135   ALKPHOS 56 10/06/2017 1135   BILITOT 0.4 10/06/2017 1135      Component Value Date/Time   TSH 3.010 04/29/2017 0935   TSH 3.96 07/16/2015 1339    ASSESSMENT AND PLAN: Type 2 diabetes mellitus without complication, without long-term current use of insulin (HCC)  Class 3 severe obesity with serious comorbidity and body mass index (BMI) of 40.0 to 44.9 in adult, unspecified obesity type ([place])  PLAN: Diabetes II [name] [name] has been given extensive diabetes education by myself today including ideal fasting and post-prandial blood glucose readings, individual ideal HgA1c goals  and hypoglycemia prevention. We discussed the importance of good blood sugar control to decrease the likelihood of diabetic complications such as nephropathy, neuropathy, limb loss, blindness, coronary artery disease, and death. We discussed the importance of intensive lifestyle modification including diet, exercise and weight loss as the first line  treatment for diabetes. [name] [name] agrees to continue her diabetes medications and will follow up at the agreed upon time.  Obesity [name] [name] is currently in the action stage of change. As such, her goal is to continue with weight loss efforts She has agreed to follow the Category 2 plan [name] [name] has been instructed to work up to a goal of 150 minutes of combined cardio and strengthening exercise per week for weight loss and overall  health benefits. We discussed the following Behavioral Modification Stratagies today: increasing lean protein intake and work on meal planning and easy cooking plans   [name] [name] has agreed to follow up with our clinic in 3 weeks. She was informed of the importance of frequent follow up visits to maximize her success with intensive lifestyle modifications for her multiple health conditions.   OBESITY BEHAVIORAL INTERVENTION VISIT  Today's visit was # 13   Starting weight: 248 lbs Starting date: 04/29/17 Today's weight : Weight: 243 lb (110.2 kg)  Today's date: 12/22/2017 Total lbs lost to date: 5    ASK: We discussed the diagnosis of obesity with [name] [name] today and [name] [name] agreed to give [name] [name] permission to discuss obesity behavioral modification therapy today.  ASSESS: [name] [name] has the diagnosis of obesity and her BMI today is @TBMI @ [name] [name] is in the action stage of change   ADVISE: [name] [name] was educated on the multiple health risks of obesity as well as the benefit of weight loss to improve her health. She was advised of the need for long term treatment and the importance of lifestyle modifications to improve her current health and to decrease her risk of future health problems.  AGREE: Multiple dietary modification options and treatment options were discussed and  [name] [name] agreed to follow the recommendations documented in the above note.  ARRANGE: [name] [name] was educated on the importance of frequent visits to treat obesity as outlined per CMS and USPSTF guidelines and agreed to schedule her next follow up appointment today.  I, April Moore, am acting as Location manager for Abby Potash, [place].  I, Abby Potash, PA-C have reviewed above note and agree with its content

## 2018-01-11 ENCOUNTER — Ambulatory Visit (INDEPENDENT_AMBULATORY_CARE_PROVIDER_SITE_OTHER): Payer: Medicare Other | Admitting: Physician Assistant

## 2018-01-11 ENCOUNTER — Encounter (INDEPENDENT_AMBULATORY_CARE_PROVIDER_SITE_OTHER): Payer: Self-pay | Admitting: Physician Assistant

## 2018-01-11 VITALS — BP 133/71 | HR 54 | Temp 98.5°F | Ht 63.0 in | Wt 243.0 lb

## 2018-01-11 DIAGNOSIS — E119 Type 2 diabetes mellitus without complications: Secondary | ICD-10-CM | POA: Diagnosis not present

## 2018-01-11 DIAGNOSIS — Z6841 Body Mass Index (BMI) 40.0 and over, adult: Secondary | ICD-10-CM | POA: Diagnosis not present

## 2018-01-13 NOTE — Progress Notes (Signed)
Office: [phone]  /  Fax: [phone]   HPI:   Chief Complaint: OBESITY [name] [name] is here to discuss her progress with her obesity treatment plan. She is on the Category 2 plan and is following her eating plan approximately 75 % of the time. She states she is walking 20 minutes 7 times per week. [name] [name] reports that she has not been getting all of her protein in at dinner. She is ready to get back on track. Her weight is 243 lb (110.2 kg) today and she has maintained weight over a period of 3 weeks since her last visit. She has lost 5 lbs since starting treatment with [name].  Diabetes II [name] [name] has a diagnosis of diabetes type II. She is currently on metformin. [name] [name] states her fasting BGs range in the 120's and she denies any hypoglycemic episodes. Last A1c was at 6.7 She has been working on intensive lifestyle modifications including diet, exercise, and weight loss to help control her blood glucose levels.  ALLERGIES: Allergies  Allergen Reactions  . Codeine Palpitations    MEDICATIONS: Current Outpatient Medications on File Prior to Visit  Medication Sig Dispense Refill  . ACCU-CHEK AVIVA PLUS test strip U UTD  5  . ACCU-CHEK SOFTCLIX LANCETS lancets U UTD  11  . amLODipine (NORVASC) 10 MG tablet Take 1 tablet (10 mg total) by mouth daily. (Patient taking differently: Take 10 mg by mouth every morning. ) 30 tablet 2  . atorvastatin (LIPITOR) 80 MG tablet Take 1 tablet by mouth every evening.   5  . benazepril (LOTENSIN) 40 MG tablet Take 40 mg by mouth every evening.     . bisoprolol (ZEBETA) 10 MG tablet Take 10 mg by mouth every morning.     . cetirizine (ZYRTEC) 10 MG tablet Take 10 mg by mouth at bedtime.     . chlorthalidone (HYGROTON) 25 MG tablet Take 25 mg by mouth every morning. 1 table once daily  3  . escitalopram (LEXAPRO) 20 MG tablet Take 20 mg by mouth every evening.     . linagliptin (TRADJENTA) 5 MG TABS tablet Take 5 mg by mouth every morning.     .  meloxicam (MOBIC) 15 MG tablet Take 15 mg by mouth every morning.     . metFORMIN (GLUCOPHAGE) 1000 MG tablet Take 1 tablet (1,000 mg total) by mouth 2 (two) times daily with a meal. 60 tablet 0  . modafinil (PROVIGIL) 100 MG tablet Take 1 tablet (100 mg total) by mouth daily. 30 tablet 2  . Multiple Vitamin (MULTIVITAMIN) tablet Take 1 tablet by mouth daily.    [name] [name] Kitchen omeprazole (PRILOSEC) 20 MG capsule Take 20 mg by mouth every morning.     . ondansetron (ZOFRAN) 4 MG tablet Take 1 tablet (4 mg total) by mouth every 8 (eight) hours as needed for nausea or vomiting. 10 tablet 0  . Vitamin D, Ergocalciferol, (DRISDOL) 50000 units CAPS capsule Take 1 capsule (50,000 Units total) by mouth every 7 (seven) days. 4 capsule 0   No current facility-administered medications on file prior to visit.     PAST MEDICAL HISTORY: Past Medical History:  Diagnosis Date  . Carpal tunnel syndrome of right wrist   . Coronary artery disease    per cath 11-29-2013  mild diffuse CAD and calcification in proximal LAD  . Depression   . Diverticulosis   . GERD (gastroesophageal reflux disease)   . History of endometrial cancer 05/2003   FIGO, Grade 1  s/p  TAG w/ BSO  . HTN (hypertension)   . Hyperlipidemia   . OA (osteoarthritis)   . OSA on CPAP    followed by dr Elsworth Soho-- per study 03-26-2012  mod. to severe OSA , AHI 37/hr  . Type II diabetes mellitus ([place])    followed by pcp  . Urinary incontinence in female   . Vitamin D deficiency     PAST SURGICAL HISTORY: Past Surgical History:  Procedure Laterality Date  . CARDIAC CATHETERIZATION  11/29/2013   dr Einar Gip   mild diffuse noncritial CAD and mild calcification involving the proximal LAD,  LVEF 55-60%  . CARPAL TUNNEL RELEASE Right 07/17/2017   Procedure: CARPAL TUNNEL RELEASE;  Surgeon: Dorna Leitz, MD;  Location: [place];  Service: Orthopedics;  Laterality: Right;  . KNEE ARTHROSCOPY Left 10/ 2004;  05-17-2008   dr graves  . KNEE  ARTHROSCOPY Right 11/2012  . LAPAROSCOPIC CHOLECYSTECTOMY  11-19-2006  dr Zella Richer  [facility]  . LEFT HEART CATHETERIZATION WITH CORONARY ANGIOGRAM N/A 11/29/2013   Procedure: LEFT HEART CATHETERIZATION WITH CORONARY ANGIOGRAM;  Surgeon: Laverda Page, MD;  Location: [facility] C[facility] LAB;  Service: Cardiovascular;  Laterality: N/A;  . TOTAL ABDOMINAL HYSTERECTOMY W/ BILATERAL SALPINGOOPHORECTOMY  06-20-2003    dr Mohammed Kindle  [facility]   endometrial cancer  . TOTAL HIP ARTHROPLASTY Right 09/02/2013   Procedure: RIGHT TOTAL HIP ARTHROPLASTY ANTERIOR APPROACH;  Surgeon: Alta Corning, MD;  Location: [place];  Service: Orthopedics;  Laterality: Right;  . TOTAL KNEE ARTHROPLASTY Left 08/02/2012   Procedure: TOTAL KNEE ARTHROPLASTY;  Surgeon: Alta Corning, MD;  Location: [place];  Service: Orthopedics;  Laterality: Left;  left total knee arthroplasty  . TUBAL LIGATION  yrs ago    SOCIAL HISTORY: Social History   Tobacco Use  . Smoking status: Never Smoker  . Smokeless tobacco: Never Used  Substance Use Topics  . Alcohol use: No  . Drug use: No    FAMILY HISTORY: Family History  Problem Relation Age of Onset  . Heart disease Father 6       CABG  . Lung cancer Father 54       lung  . Hypertension Father   . Diabetes Father   . High Cholesterol Father   . Diabetes type II Father   . Heart disease Sister 66       CABG  . Diabetes Sister 63  . Heart disease Brother 55       MI with stents  . Hypertension Mother   . Diabetes Mother   . High Cholesterol Mother   . Sudden death Mother   . Thyroid disease Mother   . Obesity Mother   . Hypertension Sister        x2   . Diabetes Sister   . Hypertension Brother   . Diabetes Brother   . Cancer Sister        high grade Neuroendocrine carcinoma right axilla  . Pancreatic cancer Other        x 3 - 2 MAunts and 1 P Aunts.   . Lupus Other        Pat Aunt    ROS: Review of Systems  Constitutional: Negative for weight loss.  Endo/Heme/Allergies:        Negative for hypoglycemia    PHYSICAL EXAM: Blood pressure 133/71, pulse (!) 54, temperature 98.5 F (36.9 C), temperature source Oral, height 5\' 3"  (1.6 m), weight 243 lb (110.2 kg), last menstrual period 11/25/2002,  SpO2 95 %. Body mass index is 43.05 kg/m. Physical Exam  Constitutional: She is oriented to person, place, and time. She appears well-developed and well-nourished.  Cardiovascular: Normal rate.  Pulmonary/Chest: Effort normal.  Musculoskeletal: Normal range of motion.  Neurological: She is oriented to person, place, and time.  Skin: Skin is warm and dry.  Psychiatric: She has a normal mood and affect. Her behavior is normal.  Vitals reviewed.   RECENT LABS AND TESTS: BMET    Component Value Date/Time   NA 141 10/06/2017 1135   K 4.8 10/06/2017 1135   CL 99 10/06/2017 1135   CO2 26 10/06/2017 1135   GLUCOSE 89 10/06/2017 1135   GLUCOSE 112 (H) 07/17/2017 1111   BUN 26 10/06/2017 1135   CREATININE 0.81 10/06/2017 1135   CREATININE 1.24 (H) 07/16/2015 1339   CALCIUM 9.7 10/06/2017 1135   GFRNONAA 76 10/06/2017 1135   GFRAA 88 10/06/2017 1135   Lab Results  Component Value Date   HGBA1C 6.7 (H) 10/06/2017   HGBA1C 6.9 (H) 04/29/2017   HGBA1C 7.0 (H) 07/16/2015   Lab Results  Component Value Date   INSULIN 24.9 10/06/2017   INSULIN 36.0 (H) 04/29/2017   CBC    Component Value Date/Time   WBC 8.4 05/24/2017 1604   RBC 4.41 05/24/2017 1604   HGB 12.9 07/17/2017 1111   HGB 13.1 04/29/2017 0935   HGB 11.2 (L) 07/11/2014 1340   HCT 38.0 07/17/2017 1111   HCT 39.4 04/29/2017 0935   PLT 262 05/24/2017 1604   MCV 91.8 05/24/2017 1604   MCV 88 04/29/2017 0935   MCH 29.9 05/24/2017 1604   MCHC 32.6 05/24/2017 1604   RDW 13.7 05/24/2017 1604   RDW 14.2 04/29/2017 0935   LYMPHSABS 1.8 05/24/2017 1604   LYMPHSABS 2.1 04/29/2017 0935   MONOABS 0.7 05/24/2017 1604   EOSABS 0.1 05/24/2017 1604   EOSABS 0.2 04/29/2017 0935   BASOSABS 0.0 05/24/2017 1604     BASOSABS 0.0 04/29/2017 0935   Iron/TIBC/Ferritin/ %Sat No results found for: IRON, TIBC, FERRITIN, IRONPCTSAT Lipid Panel     Component Value Date/Time   CHOL 194 10/06/2017 1135   TRIG 265 (H) 10/06/2017 1135   HDL 51 10/06/2017 1135   CHOLHDL 5.9 (H) 07/16/2015 1339   VLDL NOT CALC 07/16/2015 1339   LDLCALC 90 10/06/2017 1135   Hepatic Function Panel     Component Value Date/Time   PROT 7.3 10/06/2017 1135   ALBUMIN 4.5 10/06/2017 1135   AST 22 10/06/2017 1135   ALT 34 (H) 10/06/2017 1135   ALKPHOS 56 10/06/2017 1135   BILITOT 0.4 10/06/2017 1135      Component Value Date/Time   TSH 3.010 04/29/2017 0935   TSH 3.96 07/16/2015 1339   Results for [name] [name], [name] [name] (MRN [id number]) as of 01/13/2018 17:04  Ref. Range 10/06/2017 11:35  Vitamin D, 25-Hydroxy Latest Ref Range: 30.0 - 100.0 ng/mL 31.1   ASSESSMENT AND PLAN: Type 2 diabetes mellitus without complication, without long-term current use of insulin (HCC)  Class 3 severe obesity with serious comorbidity and body mass index (BMI) of 40.0 to 44.9 in adult, unspecified obesity type ([name] [name])  PLAN:  Diabetes II [name] [name] has been given extensive diabetes education by myself today including ideal fasting and post-prandial blood glucose readings, individual ideal Hgb A1c goals and hypoglycemia prevention. We discussed the importance of good blood sugar control to decrease the likelihood of diabetic complications such as nephropathy, neuropathy, limb loss, blindness, coronary artery disease,  and death. We discussed the importance of intensive lifestyle modification including diet, exercise and weight loss as the first line treatment for diabetes. [name] [name] agrees to continue with metformin and weight loss and follow up at the agreed upon time.  Obesity [name] [name] is currently in the action stage of change. As such, her goal is to continue with weight loss efforts She has agreed to follow the Category 2 plan [name] [name] has been  instructed to work up to a goal of 150 minutes of combined cardio and strengthening exercise per week for weight loss and overall health benefits. We discussed the following Behavioral Modification Strategies today: increasing lean protein intake, work on meal planning and easy cooking plans and holiday eating strategies   [name] [name] has agreed to follow up with our clinic in 3 weeks. She was informed of the importance of frequent follow up visits to maximize her success with intensive lifestyle modifications for her multiple health conditions.   OBESITY BEHAVIORAL INTERVENTION VISIT  Today's visit was # 14   Starting weight: 248 lbs Starting date: 04/29/2017 Today's weight : 243 lbs Today's date: 01/11/2018 Total lbs lost to date: 5 At least 15 minutes were spent on discussing the following behavioral intervention visit.   ASK: We discussed the diagnosis of obesity with [name] [name] today and [name] [name] agreed to give [name] permission to discuss obesity behavioral modification therapy today.  ASSESS: [name] [name] has the diagnosis of obesity and her BMI today is 43.06 [name] [name] is in the action stage of change   ADVISE: [name] [name] was educated on the multiple health risks of obesity as well as the benefit of weight loss to improve her health. She was advised of the need for long term treatment and the importance of lifestyle modifications to improve her current health and to decrease her risk of future health problems.  AGREE: Multiple dietary modification options and treatment options were discussed and  [name] [name] agreed to follow the recommendations documented in the above note.  ARRANGE: [name] [name] was educated on the importance of frequent visits to treat obesity as outlined per CMS and USPSTF guidelines and agreed to schedule her next follow up appointment today.  Corey Skains, am acting as transcriptionist for Abby Potash, PA-C I, Abby Potash, PA-C have reviewed above note and  agree with its content

## 2018-01-19 DIAGNOSIS — L570 Actinic keratosis: Secondary | ICD-10-CM | POA: Diagnosis not present

## 2018-02-01 ENCOUNTER — Encounter (INDEPENDENT_AMBULATORY_CARE_PROVIDER_SITE_OTHER): Payer: Self-pay | Admitting: Physician Assistant

## 2018-02-01 ENCOUNTER — Ambulatory Visit (INDEPENDENT_AMBULATORY_CARE_PROVIDER_SITE_OTHER): Payer: Medicare Other | Admitting: Physician Assistant

## 2018-02-01 VITALS — BP 130/62 | HR 49 | Temp 97.9°F | Ht 63.0 in | Wt 245.0 lb

## 2018-02-01 DIAGNOSIS — Z6841 Body Mass Index (BMI) 40.0 and over, adult: Secondary | ICD-10-CM | POA: Diagnosis not present

## 2018-02-01 DIAGNOSIS — E7849 Other hyperlipidemia: Secondary | ICD-10-CM | POA: Diagnosis not present

## 2018-02-01 DIAGNOSIS — E559 Vitamin D deficiency, unspecified: Secondary | ICD-10-CM

## 2018-02-01 DIAGNOSIS — E119 Type 2 diabetes mellitus without complications: Secondary | ICD-10-CM

## 2018-02-01 MED ORDER — VITAMIN D (ERGOCALCIFEROL) 1.25 MG (50000 UNIT) PO CAPS: 50000.0000 [IU] | ORAL_CAPSULE | ORAL | 0 refills | Status: DC

## 2018-02-01 MED ORDER — LINAGLIPTIN 5 MG PO TABS: 5.0000 mg | ORAL_TABLET | Freq: Every morning | ORAL | 0 refills | Status: DC

## 2018-02-01 NOTE — Progress Notes (Signed)
Office: [phone]  /  Fax: [phone]   HPI:   Chief Complaint: OBESITY [name] [name] is here to discuss her progress with her obesity treatment plan. She is on the Category 2 plan and is following her eating plan approximately 70 % of the time. She states she is exercising 0 minutes 0 times per week. [name] [name] reports that she has been eating out more while shopping for the holidays. [name] [name] loves peanut butter and she is over eating her calories. [name] [name] is ready to get back on track. Her weight is 245 lb (111.1 kg) today and has had a weight gain of 2 pounds over a period of 3 weeks since her last visit. She has lost 3 lbs since starting treatment with [name].  Diabetes II [name] [name] has a diagnosis of diabetes type II. [name] [name] states fasting BGs range between 98 and 120 and she denies any hypoglycemic episodes. [name] [name] is on [place]. She has been working on intensive lifestyle modifications including diet, exercise, and weight loss to help control her blood glucose levels.  Vitamin D deficiency [name] [name] has a diagnosis of vitamin D deficiency. She is currently taking vit D and denies nausea, vomiting or muscle weakness.  Hyperlipidemia [name] [name] has hyperlipidemia and has been trying to improve her cholesterol levels with intensive lifestyle modification including a low saturated fat diet, exercise and weight loss. She is currently on atorvastatin. She denies any chest pain.  ALLERGIES: Allergies  Allergen Reactions  . Codeine Palpitations    MEDICATIONS: Current Outpatient Medications on File Prior to Visit  Medication Sig Dispense Refill  . ACCU-CHEK AVIVA PLUS test strip U UTD  5  . ACCU-CHEK SOFTCLIX LANCETS lancets U UTD  11  . amLODipine (NORVASC) 10 MG tablet Take 1 tablet (10 mg total) by mouth daily. (Patient taking differently: Take 10 mg by mouth every morning. ) 30 tablet 2  . atorvastatin (LIPITOR) 80 MG tablet Take 1 tablet by mouth every evening.   5  . benazepril  (LOTENSIN) 40 MG tablet Take 40 mg by mouth every evening.     . bisoprolol (ZEBETA) 10 MG tablet Take 10 mg by mouth every morning.     . cetirizine (ZYRTEC) 10 MG tablet Take 10 mg by mouth at bedtime.     . chlorthalidone (HYGROTON) 25 MG tablet Take 25 mg by mouth every morning. 1 table once daily  3  . escitalopram (LEXAPRO) 20 MG tablet Take 20 mg by mouth every evening.     . meloxicam (MOBIC) 15 MG tablet Take 15 mg by mouth every morning.     . metFORMIN (GLUCOPHAGE) 1000 MG tablet Take 1 tablet (1,000 mg total) by mouth 2 (two) times daily with a meal. 60 tablet 0  . modafinil (PROVIGIL) 100 MG tablet Take 1 tablet (100 mg total) by mouth daily. 30 tablet 2  . Multiple Vitamin (MULTIVITAMIN) tablet Take 1 tablet by mouth daily.    Marland Kitchen omeprazole (PRILOSEC) 20 MG capsule Take 20 mg by mouth every morning.     . ondansetron (ZOFRAN) 4 MG tablet Take 1 tablet (4 mg total) by mouth every 8 (eight) hours as needed for nausea or vomiting. 10 tablet 0   No current facility-administered medications on file prior to visit.     PAST MEDICAL HISTORY: Past Medical History:  Diagnosis Date  . Carpal tunnel syndrome of right wrist   . Coronary artery disease    per cath 11-29-2013  mild diffuse CAD and calcification in proximal LAD  .  Depression   . Diverticulosis   . GERD (gastroesophageal reflux disease)   . History of endometrial cancer 05/2003   FIGO, Grade 1  s/p  TAG w/ BSO  . HTN (hypertension)   . Hyperlipidemia   . OA (osteoarthritis)   . OSA on CPAP    followed by dr Elsworth Soho-- per study 03-26-2012  mod. to severe OSA , AHI 37/hr  . Type II diabetes mellitus ([place])    followed by pcp  . Urinary incontinence in female   . Vitamin D deficiency     PAST SURGICAL HISTORY: Past Surgical History:  Procedure Laterality Date  . CARDIAC CATHETERIZATION  11/29/2013   dr Einar Gip   mild diffuse noncritial CAD and mild calcification involving the proximal LAD,  LVEF 55-60%  . CARPAL TUNNEL  RELEASE Right 07/17/2017   Procedure: CARPAL TUNNEL RELEASE;  Surgeon: Dorna Leitz, MD;  Location: [place];  Service: Orthopedics;  Laterality: Right;  . KNEE ARTHROSCOPY Left 10/ 2004;  05-17-2008   dr graves  . KNEE ARTHROSCOPY Right 11/2012  . LAPAROSCOPIC CHOLECYSTECTOMY  11-19-2006  dr Zella Richer  [facility]  . LEFT HEART CATHETERIZATION WITH CORONARY ANGIOGRAM N/A 11/29/2013   Procedure: LEFT HEART CATHETERIZATION WITH CORONARY ANGIOGRAM;  Surgeon: Laverda Page, MD;  Location: [facility] CATH LAB;  Service: Cardiovascular;  Laterality: N/A;  . TOTAL ABDOMINAL HYSTERECTOMY W/ BILATERAL SALPINGOOPHORECTOMY  06-20-2003    dr Mohammed Kindle  [facility]   endometrial cancer  . TOTAL HIP ARTHROPLASTY Right 09/02/2013   Procedure: RIGHT TOTAL HIP ARTHROPLASTY ANTERIOR APPROACH;  Surgeon: Alta Corning, MD;  Location: [place];  Service: Orthopedics;  Laterality: Right;  . TOTAL KNEE ARTHROPLASTY Left 08/02/2012   Procedure: TOTAL KNEE ARTHROPLASTY;  Surgeon: Alta Corning, MD;  Location: [place];  Service: Orthopedics;  Laterality: Left;  left total knee arthroplasty  . TUBAL LIGATION  yrs ago    SOCIAL HISTORY: Social History   Tobacco Use  . Smoking status: Never Smoker  . Smokeless tobacco: Never Used  Substance Use Topics  . Alcohol use: No  . Drug use: No    FAMILY HISTORY: Family History  Problem Relation Age of Onset  . Heart disease Father 5       CABG  . Lung cancer Father 64       lung  . Hypertension Father   . Diabetes Father   . High Cholesterol Father   . Diabetes type II Father   . Heart disease Sister 56       CABG  . Diabetes Sister 36  . Heart disease Brother 34       MI with stents  . Hypertension Mother   . Diabetes Mother   . High Cholesterol Mother   . Sudden death Mother   . Thyroid disease Mother   . Obesity Mother   . Hypertension Sister        x2   . Diabetes Sister   . Hypertension Brother   . Diabetes Brother   . Cancer Sister        high grade  Neuroendocrine carcinoma right axilla  . Pancreatic cancer Other        x 3 - 2 MAunts and 1 P Aunts.   . Lupus Other        Pat Aunt    ROS: Review of Systems  Constitutional: Negative for weight loss.  Cardiovascular: Negative for chest pain.  Gastrointestinal: Negative for nausea and vomiting.  Musculoskeletal:  Negative for muscle weakness  Endo/Heme/Allergies:       Negative for hypoglycemia    PHYSICAL EXAM: Blood pressure 130/62, pulse (!) 49, temperature 97.9 F (36.6 C), temperature source Oral, height 5\' 3"  (1.6 m), weight 245 lb (111.1 kg), last menstrual period 11/25/2002, SpO2 97 %. Body mass index is 43.4 kg/m. Physical Exam  Constitutional: She is oriented to person, place, and time. She appears well-developed and well-nourished.  Cardiovascular: Normal rate.  Pulmonary/Chest: Effort normal.  Musculoskeletal: Normal range of motion.  Neurological: She is oriented to person, place, and time.  Skin: Skin is warm and dry.  Psychiatric: She has a normal mood and affect. Her behavior is normal.  Vitals reviewed.   RECENT LABS AND TESTS: BMET    Component Value Date/Time   NA 141 10/06/2017 1135   K 4.8 10/06/2017 1135   CL 99 10/06/2017 1135   CO2 26 10/06/2017 1135   GLUCOSE 89 10/06/2017 1135   GLUCOSE 112 (H) 07/17/2017 1111   BUN 26 10/06/2017 1135   CREATININE 0.81 10/06/2017 1135   CREATININE 1.24 (H) 07/16/2015 1339   CALCIUM 9.7 10/06/2017 1135   GFRNONAA 76 10/06/2017 1135   GFRAA 88 10/06/2017 1135   Lab Results  Component Value Date   HGBA1C 6.7 (H) 10/06/2017   HGBA1C 6.9 (H) 04/29/2017   HGBA1C 7.0 (H) 07/16/2015   Lab Results  Component Value Date   INSULIN 24.9 10/06/2017   INSULIN 36.0 (H) 04/29/2017   CBC    Component Value Date/Time   WBC 8.4 05/24/2017 1604   RBC 4.41 05/24/2017 1604   HGB 12.9 07/17/2017 1111   HGB 13.1 04/29/2017 0935   HGB 11.2 (L) 07/11/2014 1340   HCT 38.0 07/17/2017 1111   HCT 39.4  04/29/2017 0935   PLT 262 05/24/2017 1604   MCV 91.8 05/24/2017 1604   MCV 88 04/29/2017 0935   MCH 29.9 05/24/2017 1604   MCHC 32.6 05/24/2017 1604   RDW 13.7 05/24/2017 1604   RDW 14.2 04/29/2017 0935   LYMPHSABS 1.8 05/24/2017 1604   LYMPHSABS 2.1 04/29/2017 0935   MONOABS 0.7 05/24/2017 1604   EOSABS 0.1 05/24/2017 1604   EOSABS 0.2 04/29/2017 0935   BASOSABS 0.0 05/24/2017 1604   BASOSABS 0.0 04/29/2017 0935   Iron/TIBC/Ferritin/ %Sat No results found for: IRON, TIBC, FERRITIN, IRONPCTSAT Lipid Panel     Component Value Date/Time   CHOL 194 10/06/2017 1135   TRIG 265 (H) 10/06/2017 1135   HDL 51 10/06/2017 1135   CHOLHDL 5.9 (H) 07/16/2015 1339   VLDL NOT CALC 07/16/2015 1339   LDLCALC 90 10/06/2017 1135   Hepatic Function Panel     Component Value Date/Time   PROT 7.3 10/06/2017 1135   ALBUMIN 4.5 10/06/2017 1135   AST 22 10/06/2017 1135   ALT 34 (H) 10/06/2017 1135   ALKPHOS 56 10/06/2017 1135   BILITOT 0.4 10/06/2017 1135      Component Value Date/Time   TSH 3.010 04/29/2017 0935   TSH 3.96 07/16/2015 1339    Ref. Range 10/06/2017 11:35  Vitamin D, 25-Hydroxy Latest Ref Range: 30.0 - 100.0 ng/mL 31.1   ASSESSMENT AND PLAN: Vitamin D deficiency - Plan: Vitamin D, Ergocalciferol, (DRISDOL) 1.25 MG (50000 UT) CAPS capsule, VITAMIN D 25 Hydroxy (Vit-D Deficiency, Fractures)  Type 2 diabetes mellitus without complication, without long-term current use of insulin (HCC) - Plan: linagliptin (TRADJENTA) 5 MG TABS tablet, Comprehensive metabolic panel, Hemoglobin A1c, Insulin, random  Other hyperlipidemia - Plan: Lipid  Panel With LDL/HDL Ratio  Class 3 severe obesity with serious comorbidity and body mass index (BMI) of 40.0 to 44.9 in adult, unspecified obesity type ([place])  PLAN:  Diabetes II Eryka has been given extensive diabetes education by myself today including ideal fasting and post-prandial blood glucose readings, individual ideal Hgb A1c goals and  hypoglycemia prevention. We discussed the importance of good blood sugar control to decrease the likelihood of diabetic complications such as nephropathy, neuropathy, limb loss, blindness, coronary artery disease, and death. We discussed the importance of intensive lifestyle modification including diet, exercise and weight loss as the first line treatment for diabetes. Elsye agrees to continue Tradjenta 5 mg 1 tablet by mouth every morning #30 with no refills. We will check labs today and Kassidi agrees to follow up at the agreed upon time.  Vitamin D Deficiency Zerina was informed that low vitamin D levels contributes to fatigue and are associated with obesity, breast, and colon cancer. She agrees to continue to take prescription Vit D @50 ,000 IU every week #4 with no refills and will follow up for routine testing of vitamin D, at least 2-3 times per year. She was informed of the risk of over-replacement of vitamin D and agrees to not increase her dose unless she discusses this with [name] first. We will check labs and Kellie agrees to follow up as directed.  Hyperlipidemia Aaralynn was informed of the American Heart Association Guidelines emphasizing intensive lifestyle modifications as the first line treatment for hyperlipidemia. We discussed many lifestyle modifications today in depth, and Marveline will continue to work on decreasing saturated fats such as fatty red meat, butter and many fried foods. She will also increase vegetables and lean protein in her diet and continue to work on exercise and weight loss efforts. Addison will continue atorvastatin and we will check labs today.  Obesity Anjolina is currently in the action stage of change. As such, her goal is to continue with weight loss efforts She has agreed to follow the Category 2 plan Rani has been instructed to work up to a goal of 150 minutes of combined cardio and strengthening exercise per week for weight loss and overall  health benefits. We discussed the following Behavioral Modification Strategies today: work on meal planning and easy cooking plans and holiday eating strategies   Keimya has agreed to follow up with our clinic in 2 weeks. She was informed of the importance of frequent follow up visits to maximize her success with intensive lifestyle modifications for her multiple health conditions.   OBESITY BEHAVIORAL INTERVENTION VISIT  Today's visit was # 15   Starting weight: 248 lbs Starting date: 04/29/2017 Today's weight : 245 lbs  Today's date: 02/01/2018 Total lbs lost to date: 3 At least 15 minutes were spent on discussing the following behavioral intervention visit.   ASK: We discussed the diagnosis of obesity with [name] today and Sharran agreed to give [name] permission to discuss obesity behavioral modification therapy today.  ASSESS: Zenaya has the diagnosis of obesity and her BMI today is 43.41 Bailley is in the action stage of change   ADVISE: Herbert was educated on the multiple health risks of obesity as well as the benefit of weight loss to improve her health. She was advised of the need for long term treatment and the importance of lifestyle modifications to improve her current health and to decrease her risk of future health problems.  AGREE: Multiple dietary modification options and treatment options were discussed and  Niyla agreed to follow the recommendations documented in the above note.  ARRANGE: Hania was educated on the importance of frequent visits to treat obesity as outlined per CMS and USPSTF guidelines and agreed to schedule her next follow up appointment today.  Corey Skains, am acting as transcriptionist for Abby Potash, PA-C I, Abby Potash, PA-C have reviewed above note and agree with its content

## 2018-02-02 LAB — COMPREHENSIVE METABOLIC PANEL
ALT: 25 IU/L (ref 0–32)
AST: 19 IU/L (ref 0–40)
Albumin/Globulin Ratio: 1.6 (ref 1.2–2.2)
Albumin: 4.4 g/dL (ref 3.6–4.8)
Alkaline Phosphatase: 63 IU/L (ref 39–117)
BILIRUBIN TOTAL: 0.4 mg/dL (ref 0.0–1.2)
BUN/Creatinine Ratio: 26 (ref 12–28)
BUN: 21 mg/dL (ref 8–27)
CO2: 22 mmol/L (ref 20–29)
Calcium: 9.6 mg/dL (ref 8.7–10.3)
Chloride: 100 mmol/L (ref 96–106)
Creatinine, Ser: 0.81 mg/dL (ref 0.57–1.00)
GFR calc Af Amer: 88 mL/min/{1.73_m2} (ref >59)
GFR calc non Af Amer: 76 mL/min/{1.73_m2} (ref >59)
Globulin, Total: 2.8 g/dL (ref 1.5–4.5)
Glucose: 121 mg/dL — ABNORMAL HIGH (ref 65–99)
Potassium: 4.5 mmol/L (ref 3.5–5.2)
Sodium: 141 mmol/L (ref 134–144)
TOTAL PROTEIN: 7.2 g/dL (ref 6.0–8.5)

## 2018-02-02 LAB — LIPID PANEL WITH LDL/HDL RATIO
Cholesterol, Total: 228 mg/dL — ABNORMAL HIGH (ref 100–199)
HDL: 38 mg/dL — ABNORMAL LOW (ref >39)
LDL Calculated: 115 mg/dL — ABNORMAL HIGH (ref 0–99)
LDl/HDL Ratio: 3 ratio (ref 0.0–3.2)
Triglycerides: 377 mg/dL — ABNORMAL HIGH (ref 0–149)
VLDL Cholesterol Cal: 75 mg/dL — ABNORMAL HIGH (ref 5–40)

## 2018-02-02 LAB — INSULIN, RANDOM: INSULIN: 21.4 u[IU]/mL (ref 2.6–24.9)

## 2018-02-02 LAB — VITAMIN D 25 HYDROXY (VIT D DEFICIENCY, FRACTURES): Vit D, 25-Hydroxy: 37.5 ng/mL (ref 30.0–100.0)

## 2018-02-02 LAB — HEMOGLOBIN A1C
Est. average glucose Bld gHb Est-mCnc: 146 mg/dL
Hgb A1c MFr Bld: 6.7 % — ABNORMAL HIGH (ref 4.8–5.6)

## 2018-02-15 ENCOUNTER — Encounter (INDEPENDENT_AMBULATORY_CARE_PROVIDER_SITE_OTHER): Payer: Self-pay | Admitting: Physician Assistant

## 2018-02-15 ENCOUNTER — Ambulatory Visit (INDEPENDENT_AMBULATORY_CARE_PROVIDER_SITE_OTHER): Payer: Medicare Other | Admitting: Physician Assistant

## 2018-02-15 VITALS — BP 137/71 | HR 55 | Temp 98.2°F | Ht 63.0 in | Wt 242.0 lb

## 2018-02-15 DIAGNOSIS — E119 Type 2 diabetes mellitus without complications: Secondary | ICD-10-CM | POA: Diagnosis not present

## 2018-02-15 DIAGNOSIS — Z6841 Body Mass Index (BMI) 40.0 and over, adult: Secondary | ICD-10-CM | POA: Diagnosis not present

## 2018-02-15 DIAGNOSIS — E559 Vitamin D deficiency, unspecified: Secondary | ICD-10-CM

## 2018-02-15 MED ORDER — VITAMIN D (ERGOCALCIFEROL) 1.25 MG (50000 UNIT) PO CAPS: 50000.0000 [IU] | ORAL_CAPSULE | ORAL | 0 refills | Status: DC

## 2018-02-15 NOTE — Progress Notes (Signed)
Office: [phone]  /  Fax: [phone]   HPI:   Chief Complaint: OBESITY [name] [name] is here to discuss her progress with her obesity treatment plan. She is on the Category 2 plan daily and is following her eating plan approximately 75 % of the time. She states she is walking 45 minutes 3 times per week. [name] [name] did very well with weight loss. She reports that she has worked hard to get in all of the food on the plan Her weight is 242 lb (109.8 kg) today and has had a weight loss of 3 pounds over a period of 2 weeks since her last visit. She has lost 6 lbs since starting treatment with [name].  Vitamin D deficiency [name] [name] has a diagnosis of vitamin D deficiency. She is currently taking and prescription Vit D and denies nausea, vomiting or muscle weakness.  Diabetes II [name] has a diagnosis of diabetes type II. [name] [name]'s fasting BGs range between 98 and 120 and denies any polyphagia or hypoglycemic episodes. Last A1c was Hemoglobin A1C Latest Ref Rng & Units 02/01/2018 10/06/2017 04/29/2017  HGBA1C 4.8 - 5.6 % 6.7(H) 6.7(H) 6.9(H)  Some recent data might be hidden    She has been working on intensive lifestyle modifications including diet, exercise, and weight loss to help control her blood glucose levels. [name] [name] is currently taking Metformin and Tradjenta.  ASSESSMENT AND PLAN:  Vitamin D deficiency - Plan: Vitamin D, Ergocalciferol, (DRISDOL) 1.25 MG (50000 UT) CAPS capsule  Type 2 diabetes mellitus without complication, without long-term current use of insulin (HCC)  Class 3 severe obesity with serious comorbidity and body mass index (BMI) of 40.0 to 44.9 in adult, unspecified obesity type ([place])  PLAN:  Vitamin D Deficiency [name] [name] was informed that low vitamin D levels contributes to fatigue and are associated with obesity, breast, and colon cancer. She agrees to increase her prescription Vit D 50,000 IU twice weekly #10 with no refills and will follow up for routine testing  of vitamin D, at least 2-3 times per year. She was informed of the risk of over-replacement of vitamin D and agrees to not increase her dose unless she discusses this with [name] first. Ardra agrees to follow up with our clinic in 3 weeks.  Diabetes II [name] [name] has been given extensive diabetes education by myself today including ideal fasting and post-prandial blood glucose readings, individual ideal Hgb A1c goals  and hypoglycemia prevention. We discussed the importance of good blood sugar control to decrease the likelihood of diabetic complications such as nephropathy, neuropathy, limb loss, blindness, coronary artery disease, and death. We discussed the importance of intensive lifestyle modification including diet, exercise and weight loss as the first line treatment for diabetes. [name] [name] agrees to continue taking Metformin 1000 mg bid daily with meals and Tradjenta 5 mg every morning and follow up with our clinic in 3 weeks. She denies any hypoglycemia or polyphagia. No refills were ordered today.   Obesity [name] [name] is currently in the action stage of change. As such, her goal is to continue with weight loss efforts She has agreed to follow the Category 2 plan daily [name] [name] has been instructed to work up to a goal of 150 minutes of combined cardio and strengthening exercise per week for weight loss and overall health benefits. We discussed the following Behavioral Modification Strategies today: work on meal planning and easy cooking plans and holiday eating strategies   [name] [name] has agreed to follow up with our clinic in 3 weeks. She was informed  of the importance of frequent follow up visits to maximize her success with intensive lifestyle modifications for her multiple health conditions.  ALLERGIES: Allergies  Allergen Reactions  . Codeine Palpitations    MEDICATIONS: Current Outpatient Medications on File Prior to Visit  Medication Sig Dispense Refill  . ACCU-CHEK AVIVA PLUS test  strip U UTD  5  . ACCU-CHEK SOFTCLIX LANCETS lancets U UTD  11  . amLODipine (NORVASC) 10 MG tablet Take 1 tablet (10 mg total) by mouth daily. (Patient taking differently: Take 10 mg by mouth every morning. ) 30 tablet 2  . atorvastatin (LIPITOR) 80 MG tablet Take 1 tablet by mouth every evening.   5  . benazepril (LOTENSIN) 40 MG tablet Take 40 mg by mouth every evening.     . bisoprolol (ZEBETA) 10 MG tablet Take 10 mg by mouth every morning.     . cetirizine (ZYRTEC) 10 MG tablet Take 10 mg by mouth at bedtime.     . chlorthalidone (HYGROTON) 25 MG tablet Take 25 mg by mouth every morning. 1 table once daily  3  . escitalopram (LEXAPRO) 20 MG tablet Take 20 mg by mouth every evening.     . linagliptin (TRADJENTA) 5 MG TABS tablet Take 1 tablet (5 mg total) by mouth every morning. 30 tablet 0  . meloxicam (MOBIC) 15 MG tablet Take 15 mg by mouth every morning.     . metFORMIN (GLUCOPHAGE) 1000 MG tablet Take 1 tablet (1,000 mg total) by mouth 2 (two) times daily with a meal. 60 tablet 0  . modafinil (PROVIGIL) 100 MG tablet Take 1 tablet (100 mg total) by mouth daily. 30 tablet 2  . Multiple Vitamin (MULTIVITAMIN) tablet Take 1 tablet by mouth daily.    [name] [name] Kitchen omeprazole (PRILOSEC) 20 MG capsule Take 20 mg by mouth every morning.     . ondansetron (ZOFRAN) 4 MG tablet Take 1 tablet (4 mg total) by mouth every 8 (eight) hours as needed for nausea or vomiting. 10 tablet 0   No current facility-administered medications on file prior to visit.     PAST MEDICAL HISTORY: Past Medical History:  Diagnosis Date  . Carpal tunnel syndrome of right wrist   . Coronary artery disease    per cath 11-29-2013  mild diffuse CAD and calcification in proximal LAD  . Depression   . Diverticulosis   . GERD (gastroesophageal reflux disease)   . History of endometrial cancer 05/2003   FIGO, Grade 1  s/p  TAG w/ BSO  . HTN (hypertension)   . Hyperlipidemia   . OA (osteoarthritis)   . OSA on CPAP    followed  by dr Elsworth Soho-- per study 03-26-2012  mod. to severe OSA , AHI 37/hr  . Type II diabetes mellitus ([place])    followed by pcp  . Urinary incontinence in female   . Vitamin D deficiency     PAST SURGICAL HISTORY: Past Surgical History:  Procedure Laterality Date  . CARDIAC CATHETERIZATION  11/29/2013   dr Einar Gip   mild diffuse noncritial CAD and mild calcification involving the proximal LAD,  LVEF 55-60%  . CARPAL TUNNEL RELEASE Right 07/17/2017   Procedure: CARPAL TUNNEL RELEASE;  Surgeon: Dorna Leitz, MD;  Location: [place];  Service: Orthopedics;  Laterality: Right;  . KNEE ARTHROSCOPY Left 10/ 2004;  05-17-2008   dr graves  . KNEE ARTHROSCOPY Right 11/2012  . LAPAROSCOPIC CHOLECYSTECTOMY  11-19-2006  dr Zella Richer  [facility]  . LEFT HEART CATHETERIZATION  WITH CORONARY ANGIOGRAM N/A 11/29/2013   Procedure: LEFT HEART CATHETERIZATION WITH CORONARY ANGIOGRAM;  Surgeon: Laverda Page, MD;  Location: [facility] CATH LAB;  Service: Cardiovascular;  Laterality: N/A;  . TOTAL ABDOMINAL HYSTERECTOMY W/ BILATERAL SALPINGOOPHORECTOMY  06-20-2003    dr Mohammed Kindle  [facility]   endometrial cancer  . TOTAL HIP ARTHROPLASTY Right 09/02/2013   Procedure: RIGHT TOTAL HIP ARTHROPLASTY ANTERIOR APPROACH;  Surgeon: Alta Corning, MD;  Location: [place];  Service: Orthopedics;  Laterality: Right;  . TOTAL KNEE ARTHROPLASTY Left 08/02/2012   Procedure: TOTAL KNEE ARTHROPLASTY;  Surgeon: Alta Corning, MD;  Location: [place];  Service: Orthopedics;  Laterality: Left;  left total knee arthroplasty  . TUBAL LIGATION  yrs ago    SOCIAL HISTORY: Social History   Tobacco Use  . Smoking status: Never Smoker  . Smokeless tobacco: Never Used  Substance Use Topics  . Alcohol use: No  . Drug use: No    FAMILY HISTORY: Family History  Problem Relation Age of Onset  . Heart disease Father 60       CABG  . Lung cancer Father 78       lung  . Hypertension Father   . Diabetes Father   . High Cholesterol Father   .  Diabetes type II Father   . Heart disease Sister 76       CABG  . Diabetes Sister 48  . Heart disease Brother 79       MI with stents  . Hypertension Mother   . Diabetes Mother   . High Cholesterol Mother   . Sudden death Mother   . Thyroid disease Mother   . Obesity Mother   . Hypertension Sister        x2   . Diabetes Sister   . Hypertension Brother   . Diabetes Brother   . Cancer Sister        high grade Neuroendocrine carcinoma right axilla  . Pancreatic cancer Other        x 3 - 2 MAunts and 1 P Aunts.   . Lupus Other        Pat Aunt    ROS: Review of Systems  Constitutional: Positive for weight loss.  Gastrointestinal: Negative for nausea and vomiting.  Musculoskeletal:       Negative for muscle weakness  Endo/Heme/Allergies:       Negative for hypoglycemia Negative for polyphagia     PHYSICAL EXAM: Blood pressure 137/71, pulse (!) 55, temperature 98.2 F (36.8 C), temperature source Oral, height 5\' 3"  (1.6 m), weight 242 lb (109.8 kg), last menstrual period 11/25/2002, SpO2 97 %. Body mass index is 42.87 kg/m. Physical Exam Vitals signs reviewed.  Constitutional:      Appearance: Normal appearance. She is obese.  Cardiovascular:     Rate and Rhythm: Normal rate.     Pulses: Normal pulses.  Pulmonary:     Effort: Pulmonary effort is normal.  Musculoskeletal: Normal range of motion.  Skin:    General: Skin is warm and dry.  Neurological:     Mental Status: She is alert and oriented to person, place, and time.  Psychiatric:        Mood and Affect: Mood normal.        Behavior: Behavior normal.     RECENT LABS AND TESTS: BMET    Component Value Date/Time   NA 141 02/01/2018 1125   K 4.5 02/01/2018 1125   CL 100 02/01/2018 1125  CO2 22 02/01/2018 1125   GLUCOSE 121 (H) 02/01/2018 1125   GLUCOSE 112 (H) 07/17/2017 1111   BUN 21 02/01/2018 1125   CREATININE 0.81 02/01/2018 1125   CREATININE 1.24 (H) 07/16/2015 1339   CALCIUM 9.6 02/01/2018  1125   GFRNONAA 76 02/01/2018 1125   GFRAA 88 02/01/2018 1125   Lab Results  Component Value Date   HGBA1C 6.7 (H) 02/01/2018   HGBA1C 6.7 (H) 10/06/2017   HGBA1C 6.9 (H) 04/29/2017   HGBA1C 7.0 (H) 07/16/2015   Lab Results  Component Value Date   INSULIN 21.4 02/01/2018   INSULIN 24.9 10/06/2017   INSULIN 36.0 (H) 04/29/2017   CBC    Component Value Date/Time   WBC 8.4 05/24/2017 1604   RBC 4.41 05/24/2017 1604   HGB 12.9 07/17/2017 1111   HGB 13.1 04/29/2017 0935   HGB 11.2 (L) 07/11/2014 1340   HCT 38.0 07/17/2017 1111   HCT 39.4 04/29/2017 0935   PLT 262 05/24/2017 1604   MCV 91.8 05/24/2017 1604   MCV 88 04/29/2017 0935   MCH 29.9 05/24/2017 1604   MCHC 32.6 05/24/2017 1604   RDW 13.7 05/24/2017 1604   RDW 14.2 04/29/2017 0935   LYMPHSABS 1.8 05/24/2017 1604   LYMPHSABS 2.1 04/29/2017 0935   MONOABS 0.7 05/24/2017 1604   EOSABS 0.1 05/24/2017 1604   EOSABS 0.2 04/29/2017 0935   BASOSABS 0.0 05/24/2017 1604   BASOSABS 0.0 04/29/2017 0935   Iron/TIBC/Ferritin/ %Sat No results found for: IRON, TIBC, FERRITIN, IRONPCTSAT Lipid Panel     Component Value Date/Time   CHOL 228 (H) 02/01/2018 1125   TRIG 377 (H) 02/01/2018 1125   HDL 38 (L) 02/01/2018 1125   CHOLHDL 5.9 (H) 07/16/2015 1339   VLDL NOT CALC 07/16/2015 1339   LDLCALC 115 (H) 02/01/2018 1125   Hepatic Function Panel     Component Value Date/Time   PROT 7.2 02/01/2018 1125   ALBUMIN 4.4 02/01/2018 1125   AST 19 02/01/2018 1125   ALT 25 02/01/2018 1125   ALKPHOS 63 02/01/2018 1125   BILITOT 0.4 02/01/2018 1125      Component Value Date/Time   TSH 3.010 04/29/2017 0935   TSH 3.96 07/16/2015 1339     Ref. Range 02/01/2018 11:25  Vitamin D, 25-Hydroxy Latest Ref Range: 30.0 - 100.0 ng/mL 37.5     OBESITY BEHAVIORAL INTERVENTION VISIT  Today's visit was # 16   Starting weight: 248 lbs Starting date: 04/29/17 Today's weight :242 lbs Today's date: 02/15/2018 Total lbs lost to date:  6 At least 15 minutes were spent on discussing the following behavioral intervention visit.   ASK: We discussed the diagnosis of obesity with [name] today and Addelynn agreed to give [name] permission to discuss obesity behavioral modification therapy today.  ASSESS: Kenyia has the diagnosis of obesity and her BMI today is 42.88 [place] is in the action stage of change   ADVISE: Ciella was educated on the multiple health risks of obesity as well as the benefit of weight loss to improve her health. She was advised of the need for long term treatment and the importance of lifestyle modifications to improve her current health and to decrease her risk of future health problems.  AGREE: Multiple dietary modification options and treatment options were discussed and  Shawn agreed to follow the recommendations documented in the above note.  ARRANGE: Taleya was educated on the importance of frequent visits to treat obesity as outlined per CMS and USPSTF guidelines and  agreed to schedule her next follow up appointment today.  I, Tammy Wysor, am acting as Location manager for [employer], PA-C I, Abby Potash, PA-C have reviewed above note and agree with its content

## 2018-03-01 DIAGNOSIS — L570 Actinic keratosis: Secondary | ICD-10-CM | POA: Diagnosis not present

## 2018-03-08 ENCOUNTER — Ambulatory Visit (INDEPENDENT_AMBULATORY_CARE_PROVIDER_SITE_OTHER): Payer: Medicare Other | Admitting: Physician Assistant

## 2018-03-08 ENCOUNTER — Encounter (INDEPENDENT_AMBULATORY_CARE_PROVIDER_SITE_OTHER): Payer: Self-pay | Admitting: Physician Assistant

## 2018-03-08 VITALS — BP 131/69 | HR 56 | Temp 98.1°F | Ht 63.0 in | Wt 246.0 lb

## 2018-03-08 DIAGNOSIS — Z6841 Body Mass Index (BMI) 40.0 and over, adult: Secondary | ICD-10-CM | POA: Diagnosis not present

## 2018-03-08 DIAGNOSIS — E119 Type 2 diabetes mellitus without complications: Secondary | ICD-10-CM

## 2018-03-08 NOTE — Progress Notes (Signed)
Office: [phone]  /  Fax: [phone]   HPI:   Chief Complaint: OBESITY [name] [name] is here to discuss her progress with her obesity treatment plan. She is on the Category 2 plan and is following her eating plan approximately 60 % of the time. She states she is exercising 0 minutes 0 times per week. [name] [name] reports that she struggled to follow the plan due to a death in her family. She states that her hunger is controlled. She is ready to get back on track.  Her weight is 246 lb (111.6 kg) today and has gained 4 pounds since her last visit. She has lost 2 lbs since starting treatment with [name].  Diabetes II [name] [name] has a diagnosis of diabetes type II. [name] [name] states fasting BGs range between 90's and 120's. She denies hypoglycemia. She is on Tradjenta and metformin, and she denies nausea, vomiting, or diarrhea. Last A1c was 6.7. She has been working on intensive lifestyle modifications including diet, exercise, and weight loss to help control her blood glucose levels.  ALLERGIES: Allergies  Allergen Reactions  . Codeine Palpitations    MEDICATIONS: Current Outpatient Medications on File Prior to Visit  Medication Sig Dispense Refill  . ACCU-CHEK AVIVA PLUS test strip U UTD  5  . ACCU-CHEK SOFTCLIX LANCETS lancets U UTD  11  . amLODipine (NORVASC) 10 MG tablet Take 1 tablet (10 mg total) by mouth daily. (Patient taking differently: Take 10 mg by mouth every morning. ) 30 tablet 2  . atorvastatin (LIPITOR) 80 MG tablet Take 1 tablet by mouth every evening.   5  . benazepril (LOTENSIN) 40 MG tablet Take 40 mg by mouth every evening.     . bisoprolol (ZEBETA) 10 MG tablet Take 10 mg by mouth every morning.     . cetirizine (ZYRTEC) 10 MG tablet Take 10 mg by mouth at bedtime.     . chlorthalidone (HYGROTON) 25 MG tablet Take 25 mg by mouth every morning. 1 table once daily  3  . escitalopram (LEXAPRO) 20 MG tablet Take 20 mg by mouth every evening.     . linagliptin (TRADJENTA) 5 MG  TABS tablet Take 1 tablet (5 mg total) by mouth every morning. 30 tablet 0  . meloxicam (MOBIC) 15 MG tablet Take 15 mg by mouth every morning.     . metFORMIN (GLUCOPHAGE) 1000 MG tablet Take 1 tablet (1,000 mg total) by mouth 2 (two) times daily with a meal. 60 tablet 0  . modafinil (PROVIGIL) 100 MG tablet Take 1 tablet (100 mg total) by mouth daily. 30 tablet 2  . Multiple Vitamin (MULTIVITAMIN) tablet Take 1 tablet by mouth daily.    Marland Kitchen omeprazole (PRILOSEC) 20 MG capsule Take 20 mg by mouth every morning.     . ondansetron (ZOFRAN) 4 MG tablet Take 1 tablet (4 mg total) by mouth every 8 (eight) hours as needed for nausea or vomiting. 10 tablet 0  . Vitamin D, Ergocalciferol, (DRISDOL) 1.25 MG (50000 UT) CAPS capsule Take 1 capsule (50,000 Units total) by mouth every 3 (three) days. 10 capsule 0   No current facility-administered medications on file prior to visit.     PAST MEDICAL HISTORY: Past Medical History:  Diagnosis Date  . Carpal tunnel syndrome of right wrist   . Coronary artery disease    per cath 11-29-2013  mild diffuse CAD and calcification in proximal LAD  . Depression   . Diverticulosis   . GERD (gastroesophageal reflux disease)   .  History of endometrial cancer 05/2003   FIGO, Grade 1  s/p  TAG w/ BSO  . HTN (hypertension)   . Hyperlipidemia   . OA (osteoarthritis)   . OSA on CPAP    followed by dr Elsworth Soho-- per study 03-26-2012  mod. to severe OSA , AHI 37/hr  . Type II diabetes mellitus ([place])    followed by pcp  . Urinary incontinence in female   . Vitamin D deficiency     PAST SURGICAL HISTORY: Past Surgical History:  Procedure Laterality Date  . CARDIAC CATHETERIZATION  11/29/2013   dr Einar Gip   mild diffuse noncritial CAD and mild calcification involving the proximal LAD,  LVEF 55-60%  . CARPAL TUNNEL RELEASE Right 07/17/2017   Procedure: CARPAL TUNNEL RELEASE;  Surgeon: Dorna Leitz, MD;  Location: [place];  Service: Orthopedics;   Laterality: Right;  . KNEE ARTHROSCOPY Left 10/ 2004;  05-17-2008   dr graves  . KNEE ARTHROSCOPY Right 11/2012  . LAPAROSCOPIC CHOLECYSTECTOMY  11-19-2006  dr Zella Richer  [facility]  . LEFT HEART CATHETERIZATION WITH CORONARY ANGIOGRAM N/A 11/29/2013   Procedure: LEFT HEART CATHETERIZATION WITH CORONARY ANGIOGRAM;  Surgeon: Laverda Page, MD;  Location: [facility] CATH [facility];  Service: Cardiovascular;  Laterality: N/A;  . TOTAL ABDOMINAL HYSTERECTOMY W/ BILATERAL SALPINGOOPHORECTOMY  06-20-2003    dr Mohammed Kindle  [facility]   endometrial cancer  . TOTAL HIP ARTHROPLASTY Right 09/02/2013   Procedure: RIGHT TOTAL HIP ARTHROPLASTY ANTERIOR APPROACH;  Surgeon: Alta Corning, MD;  Location: [place];  Service: Orthopedics;  Laterality: Right;  . TOTAL KNEE ARTHROPLASTY Left 08/02/2012   Procedure: TOTAL KNEE ARTHROPLASTY;  Surgeon: Alta Corning, MD;  Location: [place];  Service: Orthopedics;  Laterality: Left;  left total knee arthroplasty  . TUBAL LIGATION  yrs ago    SOCIAL HISTORY: Social History   Tobacco Use  . Smoking status: Never Smoker  . Smokeless tobacco: Never Used  Substance Use Topics  . Alcohol use: No  . Drug use: No    FAMILY HISTORY: Family History  Problem Relation Age of Onset  . Heart disease Father 33       CABG  . Lung cancer Father 56       lung  . Hypertension Father   . Diabetes Father   . High Cholesterol Father   . Diabetes type II Father   . Heart disease Sister 108       CABG  . Diabetes Sister 64  . Heart disease Brother 70       MI with stents  . Hypertension Mother   . Diabetes Mother   . High Cholesterol Mother   . Sudden death Mother   . Thyroid disease Mother   . Obesity Mother   . Hypertension Sister        x2   . Diabetes Sister   . Hypertension Brother   . Diabetes Brother   . Cancer Sister        high grade Neuroendocrine carcinoma right axilla  . Pancreatic cancer Other        x 3 - 2 MAunts and 1 P Aunts.   . Lupus Other        Pat Aunt     ROS: Review of Systems  Constitutional: Negative for weight loss.  Gastrointestinal: Negative for diarrhea, nausea and vomiting.  Endo/Heme/Allergies:       Negative hypoglycemia    PHYSICAL EXAM: Blood pressure 131/69, pulse (!) 56, temperature 98.1  F (36.7 C), temperature source Oral, height 5\' 3"  (1.6 m), weight 246 lb (111.6 kg), last menstrual period 11/25/2002, SpO2 96 %. Body mass index is 43.58 kg/m. Physical Exam Vitals signs reviewed.  Constitutional:      Appearance: Normal appearance. She is obese.  Cardiovascular:     Rate and Rhythm: Normal rate.     Pulses: Normal pulses.  Pulmonary:     Effort: Pulmonary effort is normal.     Breath sounds: Normal breath sounds.  Musculoskeletal: Normal range of motion.  Skin:    General: Skin is warm and dry.  Neurological:     Mental Status: She is alert and oriented to person, place, and time.  Psychiatric:        Mood and Affect: Mood normal.        Behavior: Behavior normal.     RECENT LABS AND TESTS: BMET    Component Value Date/Time   NA 141 02/01/2018 1125   K 4.5 02/01/2018 1125   CL 100 02/01/2018 1125   CO2 22 02/01/2018 1125   GLUCOSE 121 (H) 02/01/2018 1125   GLUCOSE 112 (H) 07/17/2017 1111   BUN 21 02/01/2018 1125   CREATININE 0.81 02/01/2018 1125   CREATININE 1.24 (H) 07/16/2015 1339   CALCIUM 9.6 02/01/2018 1125   GFRNONAA 76 02/01/2018 1125   GFRAA 88 02/01/2018 1125   Lab Results  Component Value Date   HGBA1C 6.7 (H) 02/01/2018   HGBA1C 6.7 (H) 10/06/2017   HGBA1C 6.9 (H) 04/29/2017   HGBA1C 7.0 (H) 07/16/2015   Lab Results  Component Value Date   INSULIN 21.4 02/01/2018   INSULIN 24.9 10/06/2017   INSULIN 36.0 (H) 04/29/2017   CBC    Component Value Date/Time   WBC 8.4 05/24/2017 1604   RBC 4.41 05/24/2017 1604   HGB 12.9 07/17/2017 1111   HGB 13.1 04/29/2017 0935   HGB 11.2 (L) 07/11/2014 1340   HCT 38.0 07/17/2017 1111   HCT 39.4 04/29/2017 0935   PLT 262 05/24/2017  1604   MCV 91.8 05/24/2017 1604   MCV 88 04/29/2017 0935   MCH 29.9 05/24/2017 1604   MCHC 32.6 05/24/2017 1604   RDW 13.7 05/24/2017 1604   RDW 14.2 04/29/2017 0935   LYMPHSABS 1.8 05/24/2017 1604   LYMPHSABS 2.1 04/29/2017 0935   MONOABS 0.7 05/24/2017 1604   EOSABS 0.1 05/24/2017 1604   EOSABS 0.2 04/29/2017 0935   BASOSABS 0.0 05/24/2017 1604   BASOSABS 0.0 04/29/2017 0935   Iron/TIBC/Ferritin/ %Sat No results found for: IRON, TIBC, FERRITIN, IRONPCTSAT Lipid Panel     Component Value Date/Time   CHOL 228 (H) 02/01/2018 1125   TRIG 377 (H) 02/01/2018 1125   HDL 38 (L) 02/01/2018 1125   CHOLHDL 5.9 (H) 07/16/2015 1339   VLDL NOT CALC 07/16/2015 1339   LDLCALC 115 (H) 02/01/2018 1125   Hepatic Function Panel     Component Value Date/Time   PROT 7.2 02/01/2018 1125   ALBUMIN 4.4 02/01/2018 1125   AST 19 02/01/2018 1125   ALT 25 02/01/2018 1125   ALKPHOS 63 02/01/2018 1125   BILITOT 0.4 02/01/2018 1125      Component Value Date/Time   TSH 3.010 04/29/2017 0935   TSH 3.96 07/16/2015 1339    ASSESSMENT AND PLAN: Type 2 diabetes mellitus without complication, without long-term current use of insulin (HCC)  Class 3 severe obesity with serious comorbidity and body mass index (BMI) of 40.0 to 44.9 in adult, unspecified obesity type ([place])  PLAN:  Diabetes II [name] [name] has been given extensive diabetes education by myself today including ideal fasting and post-prandial blood glucose readings, individual ideal Hgb A1c goals and hypoglycemia prevention. We discussed the importance of good blood sugar control to decrease the likelihood of diabetic complications such as nephropathy, neuropathy, limb loss, blindness, coronary artery disease, and death. We discussed the importance of intensive lifestyle modification including diet, exercise and weight loss as the first line treatment for diabetes. [name] [name] agrees to continue her diabetes medications and weight loss. [name] [name]  agrees to follow up with our clinic in 2 weeks.  Obesity [name] [name] is currently in the action stage of change. As such, her goal is to continue with weight loss efforts She has agreed to follow the Category 2 plan [name] [name] has been instructed to work up to a goal of 150 minutes of combined cardio and strengthening exercise per week for weight loss and overall health benefits. We discussed the following Behavioral Modification Strategies today: increasing lean protein intake and work on meal planning and easy cooking plans   [name] [name] has agreed to follow up with our clinic in 2 weeks. She was informed of the importance of frequent follow up visits to maximize her success with intensive lifestyle modifications for her multiple health conditions.   OBESITY BEHAVIORAL INTERVENTION VISIT  Today's visit was # 17   Starting weight: 248 lbs Starting date: 04/29/17 Today's weight : 246 lbs Today's date: 03/08/2018 Total lbs lost to date: 2 At least 15 minutes were spent on discussing the following behavioral intervention visit.   ASK: We discussed the diagnosis of obesity with [name] [name] today and [name] [name] agreed to give [name] permission to discuss obesity behavioral modification therapy today.  ASSESS: [name] [name] has the diagnosis of obesity and her BMI today is 43.59 [name] [name] is in the action stage of change   ADVISE: [name] [name] was educated on the multiple health risks of obesity as well as the benefit of weight loss to improve her health. She was advised of the need for long term treatment and the importance of lifestyle modifications.  AGREE: Multiple dietary modification options and treatment options were discussed and  [name] [name] agreed to the above obesity treatment plan.  [name] [name], am acting as transcriptionist for Abby Potash, PA-C I, Abby Potash, PA-C have reviewed above note and agree with its content

## 2018-03-12 DIAGNOSIS — M542 Cervicalgia: Secondary | ICD-10-CM | POA: Diagnosis not present

## 2018-03-16 DIAGNOSIS — G5621 Lesion of ulnar nerve, right upper limb: Secondary | ICD-10-CM | POA: Diagnosis not present

## 2018-03-16 DIAGNOSIS — M25531 Pain in right wrist: Secondary | ICD-10-CM | POA: Diagnosis not present

## 2018-03-23 ENCOUNTER — Ambulatory Visit (INDEPENDENT_AMBULATORY_CARE_PROVIDER_SITE_OTHER): Payer: Medicare Other | Admitting: Physician Assistant

## 2018-03-23 ENCOUNTER — Encounter (INDEPENDENT_AMBULATORY_CARE_PROVIDER_SITE_OTHER): Payer: Self-pay | Admitting: Physician Assistant

## 2018-03-23 VITALS — BP 130/72 | HR 59 | Temp 98.2°F | Ht 63.0 in | Wt 243.0 lb

## 2018-03-23 DIAGNOSIS — E119 Type 2 diabetes mellitus without complications: Secondary | ICD-10-CM | POA: Diagnosis not present

## 2018-03-23 DIAGNOSIS — E559 Vitamin D deficiency, unspecified: Secondary | ICD-10-CM

## 2018-03-23 DIAGNOSIS — Z6841 Body Mass Index (BMI) 40.0 and over, adult: Secondary | ICD-10-CM | POA: Diagnosis not present

## 2018-03-23 MED ORDER — VITAMIN D (ERGOCALCIFEROL) 1.25 MG (50000 UNIT) PO CAPS: 50000.0000 [IU] | ORAL_CAPSULE | ORAL | 0 refills | Status: DC

## 2018-03-23 NOTE — Progress Notes (Signed)
Office: [phone]  /  Fax: [phone]   HPI:   Chief Complaint: OBESITY [name] [name] is here to discuss her progress with her obesity treatment plan. She is on the Category 2 plan and is following her eating plan approximately 70 % of the time. She states she is walking 20 minutes 5 times per week. [name] [name] did very well with weight loss. She reports that she has been working hard to stay on plan and get all of her protein in. Her weight is 243 lb (110.2 kg) today and has had a weight loss of 3 pounds over a period of 2 weeks since her last visit. She has lost 5 lbs since starting treatment with [name].  Diabetes II [name] [name] has a diagnosis of diabetes type II. [name] [name] states fasting BGs range between 98 and 121 and denies any hypoglycemic episodes. She is taking metformin and Tradjenta. She denies polyphagia. Last A1c was Hemoglobin A1C Latest Ref Rng & Units 02/01/2018 10/06/2017 04/29/2017  HGBA1C 4.8 - 5.6 % 6.7(H) 6.7(H) 6.9(H)  Some recent data might be hidden    She has been working on intensive lifestyle modifications including diet, exercise, and weight loss to help control her blood glucose levels.  Vitamin D deficiency [name] [name] has a diagnosis of vitamin D deficiency. She is currently taking prescription Vit D and denies nausea, vomiting or muscle weakness.   ASSESSMENT AND PLAN:  Vitamin D deficiency - Plan: Vitamin D, Ergocalciferol, (DRISDOL) 1.25 MG (50000 UT) CAPS capsule  Type 2 diabetes mellitus without complication, without long-term current use of insulin (HCC)  Class 3 severe obesity with serious comorbidity and body mass index (BMI) of 40.0 to 44.9 in adult, unspecified obesity type ([place])  PLAN:   Diabetes II [name] [name] has been given extensive diabetes education by myself today including ideal fasting and post-prandial blood glucose readings, individual ideal Hgb A1c goals  and hypoglycemia prevention. We discussed the importance of good blood sugar control to  decrease the likelihood of diabetic complications such as nephropathy, neuropathy, limb loss, blindness, coronary artery disease, and death. We discussed the importance of intensive lifestyle modification including diet, exercise and weight loss as the first line treatment for diabetes. [name] [name] agrees to continue taking her diabetes medications and will follow up with our clinic in 2 weeks.  Vitamin D Deficiency [name] [name] was informed that low vitamin D levels contributes to fatigue and are associated with obesity, breast, and colon cancer. She agrees to continue to take prescription Vit D @50 ,000 IU every 3 days #10 with no refills and will follow up for routine testing of vitamin D, at least 2-3 times per year. She was informed of the risk of over-replacement of vitamin D and agrees to not increase her dose unless she discusses this with [name] first. [name] [name] agrees to follow up with our clinic in 2 weeks.  Obesity [name] [name] is currently in the action stage of change. As such, her goal is to continue with weight loss efforts She has agreed to follow the Category 2 plan [name] [name] has been instructed to work up to a goal of 150 minutes of combined cardio and strengthening exercise per week for weight loss and overall health benefits. We discussed the following Behavioral Modification Strategies today: work on meal planning and easy cooking plans and ways to avoid boredom eating  [name] [name] has agreed to follow up with our clinic in 2 weeks. She was informed of the importance of frequent follow up visits to maximize her success with intensive lifestyle modifications  for her multiple health conditions.  ALLERGIES: Allergies  Allergen Reactions  . Codeine Palpitations    MEDICATIONS: Current Outpatient Medications on File Prior to Visit  Medication Sig Dispense Refill  . ACCU-CHEK AVIVA PLUS test strip U UTD  5  . ACCU-CHEK SOFTCLIX LANCETS lancets U UTD  11  . amLODipine (NORVASC) 10 MG tablet Take 1  tablet (10 mg total) by mouth daily. (Patient taking differently: Take 10 mg by mouth every morning. ) 30 tablet 2  . atorvastatin (LIPITOR) 80 MG tablet Take 1 tablet by mouth every evening.   5  . benazepril (LOTENSIN) 40 MG tablet Take 40 mg by mouth every evening.     . bisoprolol (ZEBETA) 10 MG tablet Take 10 mg by mouth every morning.     . cetirizine (ZYRTEC) 10 MG tablet Take 10 mg by mouth at bedtime.     . chlorthalidone (HYGROTON) 25 MG tablet Take 25 mg by mouth every morning. 1 table once daily  3  . escitalopram (LEXAPRO) 20 MG tablet Take 20 mg by mouth every evening.     . linagliptin (TRADJENTA) 5 MG TABS tablet Take 1 tablet (5 mg total) by mouth every morning. 30 tablet 0  . meloxicam (MOBIC) 15 MG tablet Take 15 mg by mouth every morning.     . metFORMIN (GLUCOPHAGE) 1000 MG tablet Take 1 tablet (1,000 mg total) by mouth 2 (two) times daily with a meal. 60 tablet 0  . modafinil (PROVIGIL) 100 MG tablet Take 1 tablet (100 mg total) by mouth daily. 30 tablet 2  . Multiple Vitamin (MULTIVITAMIN) tablet Take 1 tablet by mouth daily.    Marland Kitchen omeprazole (PRILOSEC) 20 MG capsule Take 20 mg by mouth every morning.     . ondansetron (ZOFRAN) 4 MG tablet Take 1 tablet (4 mg total) by mouth every 8 (eight) hours as needed for nausea or vomiting. 10 tablet 0   No current facility-administered medications on file prior to visit.     PAST MEDICAL HISTORY: Past Medical History:  Diagnosis Date  . Carpal tunnel syndrome of right wrist   . Coronary artery disease    per cath 11-29-2013  mild diffuse CAD and calcification in proximal LAD  . Depression   . Diverticulosis   . GERD (gastroesophageal reflux disease)   . History of endometrial cancer 05/2003   FIGO, Grade 1  s/p  TAG w/ BSO  . HTN (hypertension)   . Hyperlipidemia   . OA (osteoarthritis)   . OSA on CPAP    followed by dr Elsworth Soho-- per study 03-26-2012  mod. to severe OSA , AHI 37/hr  . Type II diabetes mellitus ([place])     followed by pcp  . Urinary incontinence in female   . Vitamin D deficiency     PAST SURGICAL HISTORY: Past Surgical History:  Procedure Laterality Date  . CARDIAC CATHETERIZATION  11/29/2013   dr Einar Gip   mild diffuse noncritial CAD and mild calcification involving the proximal LAD,  LVEF 55-60%  . CARPAL TUNNEL RELEASE Right 07/17/2017   Procedure: CARPAL TUNNEL RELEASE;  Surgeon: Dorna Leitz, MD;  Location: [facility];  Service: Orthopedics;  Laterality: Right;  . KNEE ARTHROSCOPY Left 10/ 2004;  05-17-2008   dr graves  . KNEE ARTHROSCOPY Right 11/2012  . LAPAROSCOPIC CHOLECYSTECTOMY  11-19-2006  dr Zella Richer  [facility]  . LEFT HEART CATHETERIZATION WITH CORONARY ANGIOGRAM N/A 11/29/2013   Procedure: LEFT HEART CATHETERIZATION WITH CORONARY ANGIOGRAM;  Surgeon:  Laverda Page, MD;  Location: [facility] CATH LAB;  Service: Cardiovascular;  Laterality: N/A;  . TOTAL ABDOMINAL HYSTERECTOMY W/ BILATERAL SALPINGOOPHORECTOMY  06-20-2003    dr Mohammed Kindle  [facility]   endometrial cancer  . TOTAL HIP ARTHROPLASTY Right 09/02/2013   Procedure: RIGHT TOTAL HIP ARTHROPLASTY ANTERIOR APPROACH;  Surgeon: Alta Corning, MD;  Location: [place];  Service: Orthopedics;  Laterality: Right;  . TOTAL KNEE ARTHROPLASTY Left 08/02/2012   Procedure: TOTAL KNEE ARTHROPLASTY;  Surgeon: Alta Corning, MD;  Location: [place];  Service: Orthopedics;  Laterality: Left;  left total knee arthroplasty  . TUBAL LIGATION  yrs ago    SOCIAL HISTORY: Social History   Tobacco Use  . Smoking status: Never Smoker  . Smokeless tobacco: Never Used  Substance Use Topics  . Alcohol use: No  . Drug use: No    FAMILY HISTORY: Family History  Problem Relation Age of Onset  . Heart disease Father 51       CABG  . Lung cancer Father 86       lung  . Hypertension Father   . Diabetes Father   . High Cholesterol Father   . Diabetes type II Father   . Heart disease Sister 34       CABG  . Diabetes Sister 16  . Heart disease  Brother 59       MI with stents  . Hypertension Mother   . Diabetes Mother   . High Cholesterol Mother   . Sudden death Mother   . Thyroid disease Mother   . Obesity Mother   . Hypertension Sister        x2   . Diabetes Sister   . Hypertension Brother   . Diabetes Brother   . Cancer Sister        high grade Neuroendocrine carcinoma right axilla  . Pancreatic cancer Other        x 3 - 2 MAunts and 1 P Aunts.   . Lupus Other        Pat Aunt    ROS: Review of Systems  Constitutional: Positive for weight loss.  Gastrointestinal: Negative for diarrhea, nausea and vomiting.  Musculoskeletal:       Negative for muscle weakness  Endo/Heme/Allergies:       Negative for hypoglycemia Negative for polyphagia    PHYSICAL EXAM: Blood pressure 130/72, pulse (!) 59, temperature 98.2 F (36.8 C), temperature source Oral, height 5\' 3"  (1.6 m), weight 243 lb (110.2 kg), last menstrual period 11/25/2002, SpO2 96 %. Body mass index is 43.05 kg/m. Physical Exam Vitals signs reviewed.  Constitutional:      Appearance: Normal appearance. She is obese.  Cardiovascular:     Rate and Rhythm: Normal rate.     Pulses: Normal pulses.  Pulmonary:     Effort: Pulmonary effort is normal.  Musculoskeletal: Normal range of motion.  Skin:    General: Skin is warm and dry.  Neurological:     Mental Status: She is alert and oriented to person, place, and time.  Psychiatric:        Mood and Affect: Mood normal.        Behavior: Behavior normal.     RECENT LABS AND TESTS: BMET    Component Value Date/Time   NA 141 02/01/2018 1125   K 4.5 02/01/2018 1125   CL 100 02/01/2018 1125   CO2 22 02/01/2018 1125   GLUCOSE 121 (H) 02/01/2018 1125   GLUCOSE  112 (H) 07/17/2017 1111   BUN 21 02/01/2018 1125   CREATININE 0.81 02/01/2018 1125   CREATININE 1.24 (H) 07/16/2015 1339   CALCIUM 9.6 02/01/2018 1125   GFRNONAA 76 02/01/2018 1125   GFRAA 88 02/01/2018 1125   Lab Results  Component Value  Date   HGBA1C 6.7 (H) 02/01/2018   HGBA1C 6.7 (H) 10/06/2017   HGBA1C 6.9 (H) 04/29/2017   HGBA1C 7.0 (H) 07/16/2015   Lab Results  Component Value Date   INSULIN 21.4 02/01/2018   INSULIN 24.9 10/06/2017   INSULIN 36.0 (H) 04/29/2017   CBC    Component Value Date/Time   WBC 8.4 05/24/2017 1604   RBC 4.41 05/24/2017 1604   HGB 12.9 07/17/2017 1111   HGB 13.1 04/29/2017 0935   HGB 11.2 (L) 07/11/2014 1340   HCT 38.0 07/17/2017 1111   HCT 39.4 04/29/2017 0935   PLT 262 05/24/2017 1604   MCV 91.8 05/24/2017 1604   MCV 88 04/29/2017 0935   MCH 29.9 05/24/2017 1604   MCHC 32.6 05/24/2017 1604   RDW 13.7 05/24/2017 1604   RDW 14.2 04/29/2017 0935   LYMPHSABS 1.8 05/24/2017 1604   LYMPHSABS 2.1 04/29/2017 0935   MONOABS 0.7 05/24/2017 1604   EOSABS 0.1 05/24/2017 1604   EOSABS 0.2 04/29/2017 0935   BASOSABS 0.0 05/24/2017 1604   BASOSABS 0.0 04/29/2017 0935   Iron/TIBC/Ferritin/ %Sat No results found for: IRON, TIBC, FERRITIN, IRONPCTSAT Lipid Panel     Component Value Date/Time   CHOL 228 (H) 02/01/2018 1125   TRIG 377 (H) 02/01/2018 1125   HDL 38 (L) 02/01/2018 1125   CHOLHDL 5.9 (H) 07/16/2015 1339   VLDL NOT CALC 07/16/2015 1339   LDLCALC 115 (H) 02/01/2018 1125   Hepatic Function Panel     Component Value Date/Time   PROT 7.2 02/01/2018 1125   ALBUMIN 4.4 02/01/2018 1125   AST 19 02/01/2018 1125   ALT 25 02/01/2018 1125   ALKPHOS 63 02/01/2018 1125   BILITOT 0.4 02/01/2018 1125      Component Value Date/Time   TSH 3.010 04/29/2017 0935   TSH 3.96 07/16/2015 1339    Ref. Range 02/01/2018 11:25  Vitamin D, 25-Hydroxy Latest Ref Range: 30.0 - 100.0 ng/mL 37.5      OBESITY BEHAVIORAL INTERVENTION VISIT  Today's visit was # 18   Starting weight: 248 lbs Starting date: 04/29/2017 Today's weight :: 243 lbs Today's date: 03/23/2018 Total lbs lost to date: 5 At least 15 minutes were spent on discussing the following behavioral intervention  visit.   ASK: We discussed the diagnosis of obesity with [name] [name] today and [name] [name] agreed to give [name] permission to discuss obesity behavioral modification therapy today.  ASSESS: Adison has the diagnosis of obesity and her BMI today is 43.06 [name] [name] is in the action stage of change   ADVISE: [name] [name] was educated on the multiple health risks of obesity as well as the benefit of weight loss to improve her health. She was advised of the need for long term treatment and the importance of lifestyle modifications to improve her current health and to decrease her risk of future health problems.  AGREE: Multiple dietary modification options and treatment options were discussed and  [name] [name] agreed to follow the recommendations documented in the above note.  ARRANGE: [name] [name] was educated on the importance of frequent visits to treat obesity as outlined per CMS and USPSTF guidelines and agreed to schedule her next follow up appointment today.  I, Tammy Wysor,  am acting as transcriptionist for [employer] PA-C I, Abby Potash, PA-C have reviewed above note and agree with its content

## 2018-04-06 DIAGNOSIS — L814 Other melanin hyperpigmentation: Secondary | ICD-10-CM | POA: Diagnosis not present

## 2018-04-06 DIAGNOSIS — L570 Actinic keratosis: Secondary | ICD-10-CM | POA: Diagnosis not present

## 2018-04-07 ENCOUNTER — Other Ambulatory Visit: Payer: Self-pay | Admitting: Cardiology

## 2018-04-07 ENCOUNTER — Encounter (INDEPENDENT_AMBULATORY_CARE_PROVIDER_SITE_OTHER): Payer: Self-pay

## 2018-04-07 ENCOUNTER — Ambulatory Visit (INDEPENDENT_AMBULATORY_CARE_PROVIDER_SITE_OTHER): Payer: Medicare Other | Admitting: Physician Assistant

## 2018-04-07 ENCOUNTER — Ambulatory Visit: Payer: Medicare Other | Admitting: Adult Health

## 2018-04-15 ENCOUNTER — Ambulatory Visit (INDEPENDENT_AMBULATORY_CARE_PROVIDER_SITE_OTHER): Payer: Medicare Other | Admitting: Physician Assistant

## 2018-04-15 ENCOUNTER — Encounter (INDEPENDENT_AMBULATORY_CARE_PROVIDER_SITE_OTHER): Payer: Self-pay | Admitting: Physician Assistant

## 2018-04-15 VITALS — BP 122/69 | HR 53 | Temp 98.1°F | Ht 63.0 in | Wt 244.0 lb

## 2018-04-15 DIAGNOSIS — E119 Type 2 diabetes mellitus without complications: Secondary | ICD-10-CM | POA: Diagnosis not present

## 2018-04-15 DIAGNOSIS — Z6841 Body Mass Index (BMI) 40.0 and over, adult: Secondary | ICD-10-CM

## 2018-04-15 NOTE — Progress Notes (Signed)
Office: [phone]  /  Fax: [phone]   HPI:   Chief Complaint: OBESITY [name] [name] is here to discuss her progress with her obesity treatment plan. She is on the Category 2 plan and is following her eating plan approximately 75% of the time. She states she is exercising 0 minutes 0 times per week. [name] [name] reports that she was sick with a GI virus and was unable to eat for a few days. She has been able to get back on track the past 2 days.  Her weight is 244 lb (110.7 kg) today and has had a weight gain of 1 lb since her last visit. She has lost 4 lbs since starting treatment with [name].  Diabetes II [name] [name] has a diagnosis of diabetes type II. [name] [name] states fasting blood sugars range between 100 and 120 and denies any hypoglycemic episodes. Last A1c was 6.7 on 02/01/2018. [name] [name] is on metformin and reports no nausea, vomiting, diarrhea, or polyphagia. She has been working on intensive lifestyle modifications including diet, exercise, and weight loss to help control her blood glucose levels.  ASSESSMENT AND PLAN:  Type 2 diabetes mellitus without complication, without long-term current use of insulin (HCC)  Class 3 severe obesity with serious comorbidity and body mass index (BMI) of 40.0 to 44.9 in adult, unspecified obesity type ([name] [name])  PLAN:  Diabetes II [name] [name] has been given extensive diabetes education by myself today including ideal fasting and post-prandial blood glucose readings, individual ideal Hgb A1c goals  and hypoglycemia prevention. We discussed the importance of good blood sugar control to decrease the likelihood of diabetic complications such as nephropathy, neuropathy, limb loss, blindness, coronary artery disease, and death. We discussed the importance of intensive lifestyle modification including diet, exercise and weight loss as the first line treatment for diabetes. [name] [name] agrees to continue her diabetes medications and will follow-up at the agreed upon  time.  Obesity [name] [name] is currently in the action stage of change. As such, her goal is to continue with weight loss efforts. She has agreed to follow the Category 2 plan. [name] [name] has been instructed to work up to a goal of 150 minutes of combined cardio and strengthening exercise per week for weight loss and overall health benefits. We discussed the following Behavioral Modification Strategies today: work on meal planning and easy cooking plans and planning for success.  [name] [name] has agreed to follow-up with our clinic in 2 weeks. She was informed of the importance of frequent follow up visits to maximize her success with intensive lifestyle modifications for her multiple health conditions.  ALLERGIES: Allergies  Allergen Reactions  . Codeine Palpitations    MEDICATIONS: Current Outpatient Medications on File Prior to Visit  Medication Sig Dispense Refill  . ACCU-CHEK AVIVA PLUS test strip U UTD  5  . ACCU-CHEK SOFTCLIX LANCETS lancets U UTD  11  . amLODipine (NORVASC) 10 MG tablet Take 1 tablet (10 mg total) by mouth daily. (Patient taking differently: Take 10 mg by mouth every morning. ) 30 tablet 2  . atorvastatin (LIPITOR) 80 MG tablet Take 1 tablet by mouth every evening.   5  . benazepril (LOTENSIN) 40 MG tablet Take 40 mg by mouth every evening.     . bisoprolol (ZEBETA) 10 MG tablet TAKE 1 TABLET BY MOUTH DAILY 90 tablet 1  . cetirizine (ZYRTEC) 10 MG tablet Take 10 mg by mouth at bedtime.     . chlorthalidone (HYGROTON) 25 MG tablet Take 25 mg by mouth every morning. 1 table  once daily  3  . escitalopram (LEXAPRO) 20 MG tablet Take 20 mg by mouth every evening.     . linagliptin (TRADJENTA) 5 MG TABS tablet Take 1 tablet (5 mg total) by mouth every morning. 30 tablet 0  . meloxicam (MOBIC) 15 MG tablet Take 15 mg by mouth every morning.     . metFORMIN (GLUCOPHAGE) 1000 MG tablet Take 1 tablet (1,000 mg total) by mouth 2 (two) times daily with a meal. 60 tablet 0  .  modafinil (PROVIGIL) 100 MG tablet Take 1 tablet (100 mg total) by mouth daily. 30 tablet 2  . Multiple Vitamin (MULTIVITAMIN) tablet Take 1 tablet by mouth daily.    Marland Kitchen omeprazole (PRILOSEC) 20 MG capsule Take 20 mg by mouth every morning.     . ondansetron (ZOFRAN) 4 MG tablet Take 1 tablet (4 mg total) by mouth every 8 (eight) hours as needed for nausea or vomiting. 10 tablet 0  . Vitamin D, Ergocalciferol, (DRISDOL) 1.25 MG (50000 UT) CAPS capsule Take 1 capsule (50,000 Units total) by mouth every 3 (three) days. 10 capsule 0   No current facility-administered medications on file prior to visit.     PAST MEDICAL HISTORY: Past Medical History:  Diagnosis Date  . Carpal tunnel syndrome of right wrist   . Coronary artery disease    per cath 11-29-2013  mild diffuse CAD and calcification in proximal LAD  . Depression   . Diverticulosis   . GERD (gastroesophageal reflux disease)   . History of endometrial cancer 05/2003   FIGO, Grade 1  s/p  TAG w/ BSO  . HTN (hypertension)   . Hyperlipidemia   . OA (osteoarthritis)   . OSA on CPAP    followed by dr Elsworth Soho-- per study 03-26-2012  mod. to severe OSA , AHI 37/hr  . Type II diabetes mellitus ([name] [name])    followed by pcp  . Urinary incontinence in female   . Vitamin D deficiency     PAST SURGICAL HISTORY: Past Surgical History:  Procedure Laterality Date  . CARDIAC CATHETERIZATION  11/29/2013   dr Einar Gip   mild diffuse noncritial CAD and mild calcification involving the proximal LAD,  LVEF 55-60%  . CARPAL TUNNEL RELEASE Right 07/17/2017   Procedure: CARPAL TUNNEL RELEASE;  Surgeon: Dorna Leitz, MD;  Location: [place];  Service: Orthopedics;  Laterality: Right;  . KNEE ARTHROSCOPY Left 10/ 2004;  05-17-2008   dr graves  . KNEE ARTHROSCOPY Right 11/2012  . LAPAROSCOPIC CHOLECYSTECTOMY  11-19-2006  dr Zella Richer  [facility]  . LEFT HEART CATHETERIZATION WITH CORONARY ANGIOGRAM N/A 11/29/2013   Procedure: LEFT HEART  CATHETERIZATION WITH CORONARY ANGIOGRAM;  Surgeon: Laverda Page, MD;  Location: [facility] CATH LAB;  Service: Cardiovascular;  Laterality: N/A;  . TOTAL ABDOMINAL HYSTERECTOMY W/ BILATERAL SALPINGOOPHORECTOMY  06-20-2003    dr Mohammed Kindle  [facility]   endometrial cancer  . TOTAL HIP ARTHROPLASTY Right 09/02/2013   Procedure: RIGHT TOTAL HIP ARTHROPLASTY ANTERIOR APPROACH;  Surgeon: Alta Corning, MD;  Location: [place];  Service: Orthopedics;  Laterality: Right;  . TOTAL KNEE ARTHROPLASTY Left 08/02/2012   Procedure: TOTAL KNEE ARTHROPLASTY;  Surgeon: Alta Corning, MD;  Location: [place];  Service: Orthopedics;  Laterality: Left;  left total knee arthroplasty  . TUBAL LIGATION  yrs ago    SOCIAL HISTORY: Social History   Tobacco Use  . Smoking status: Never Smoker  . Smokeless tobacco: Never Used  Substance Use Topics  . Alcohol  use: No  . Drug use: No    FAMILY HISTORY: Family History  Problem Relation Age of Onset  . Heart disease Father 82       CABG  . Lung cancer Father 53       lung  . Hypertension Father   . Diabetes Father   . High Cholesterol Father   . Diabetes type II Father   . Heart disease Sister 27       CABG  . Diabetes Sister 33  . Heart disease Brother 33       MI with stents  . Hypertension Mother   . Diabetes Mother   . High Cholesterol Mother   . Sudden death Mother   . Thyroid disease Mother   . Obesity Mother   . Hypertension Sister        x2   . Diabetes Sister   . Hypertension Brother   . Diabetes Brother   . Cancer Sister        high grade Neuroendocrine carcinoma right axilla  . Pancreatic cancer Other        x 3 - 2 MAunts and 1 P Aunts.   . Lupus Other        Pat Aunt   ROS: Review of Systems  Constitutional: Negative for weight loss.  Gastrointestinal: Negative for diarrhea, nausea and vomiting.  Endo/Heme/Allergies:       Negative for polyphagia. Negative for hypoglycemia.   PHYSICAL EXAM: Blood pressure 122/69, pulse (!) 53, temperature  98.1 F (36.7 C), temperature source Oral, height 5\' 3"  (1.6 m), weight 244 lb (110.7 kg), last menstrual period 11/25/2002, SpO2 99 %. Body mass index is 43.22 kg/m. Physical Exam Vitals signs reviewed.  Constitutional:      Appearance: Normal appearance. She is obese.  Cardiovascular:     Rate and Rhythm: Normal rate.     Pulses: Normal pulses.  Pulmonary:     Effort: Pulmonary effort is normal.     Breath sounds: Normal breath sounds.  Musculoskeletal: Normal range of motion.  Skin:    General: Skin is warm and dry.  Neurological:     Mental Status: She is alert and oriented to person, place, and time.  Psychiatric:        Behavior: Behavior normal.   RECENT LABS AND TESTS: BMET    Component Value Date/Time   NA 141 02/01/2018 1125   K 4.5 02/01/2018 1125   CL 100 02/01/2018 1125   CO2 22 02/01/2018 1125   GLUCOSE 121 (H) 02/01/2018 1125   GLUCOSE 112 (H) 07/17/2017 1111   BUN 21 02/01/2018 1125   CREATININE 0.81 02/01/2018 1125   CREATININE 1.24 (H) 07/16/2015 1339   CALCIUM 9.6 02/01/2018 1125   GFRNONAA 76 02/01/2018 1125   GFRAA 88 02/01/2018 1125   Lab Results  Component Value Date   HGBA1C 6.7 (H) 02/01/2018   HGBA1C 6.7 (H) 10/06/2017   HGBA1C 6.9 (H) 04/29/2017   HGBA1C 7.0 (H) 07/16/2015   Lab Results  Component Value Date   INSULIN 21.4 02/01/2018   INSULIN 24.9 10/06/2017   INSULIN 36.0 (H) 04/29/2017   CBC    Component Value Date/Time   WBC 8.4 05/24/2017 1604   RBC 4.41 05/24/2017 1604   HGB 12.9 07/17/2017 1111   HGB 13.1 04/29/2017 0935   HGB 11.2 (L) 07/11/2014 1340   HCT 38.0 07/17/2017 1111   HCT 39.4 04/29/2017 0935   PLT 262 05/24/2017 1604   MCV 91.8  05/24/2017 1604   MCV 88 04/29/2017 0935   MCH 29.9 05/24/2017 1604   MCHC 32.6 05/24/2017 1604   RDW 13.7 05/24/2017 1604   RDW 14.2 04/29/2017 0935   LYMPHSABS 1.8 05/24/2017 1604   LYMPHSABS 2.1 04/29/2017 0935   MONOABS 0.7 05/24/2017 1604   EOSABS 0.1 05/24/2017 1604    EOSABS 0.2 04/29/2017 0935   BASOSABS 0.0 05/24/2017 1604   BASOSABS 0.0 04/29/2017 0935   Iron/TIBC/Ferritin/ %Sat No results found for: IRON, TIBC, FERRITIN, IRONPCTSAT Lipid Panel     Component Value Date/Time   CHOL 228 (H) 02/01/2018 1125   TRIG 377 (H) 02/01/2018 1125   HDL 38 (L) 02/01/2018 1125   CHOLHDL 5.9 (H) 07/16/2015 1339   VLDL NOT CALC 07/16/2015 1339   LDLCALC 115 (H) 02/01/2018 1125   Hepatic Function Panel     Component Value Date/Time   PROT 7.2 02/01/2018 1125   ALBUMIN 4.4 02/01/2018 1125   AST 19 02/01/2018 1125   ALT 25 02/01/2018 1125   ALKPHOS 63 02/01/2018 1125   BILITOT 0.4 02/01/2018 1125      Component Value Date/Time   TSH 3.010 04/29/2017 0935   TSH 3.96 07/16/2015 1339    Ref. Range 02/01/2018 11:25  Vitamin D, 25-Hydroxy Latest Ref Range: 30.0 - 100.0 ng/mL 37.5   OBESITY BEHAVIORAL INTERVENTION VISIT  Today's visit was #19  Starting weight: 248 lbs Starting date: 04/29/2017 Today's weight: 244 lbs  Today's date: 04/15/2018 Total lbs lost to date: 4 At least 15 minutes were spent on discussing the following behavioral intervention visit.    04/15/2018  BP 122/69  Temp 98.1 F (36.7 C)  Pulse 53 (A)  SpO2 99 %  Weight 244 lb  Height 5\' 3"  (1.6 m)  BMI (Calculated) 43.23   ASK: We discussed the diagnosis of obesity with [name] today and [name] [name] agreed to give [name] permission to discuss obesity behavioral modification therapy today.  ASSESS: [name] [name] has the diagnosis of obesity and her BMI today is 43.23. [name] [name] is in the action stage of change.   ADVISE: [name] [name] was educated on the multiple health risks of obesity as well as the benefit of weight loss to improve her health. She was advised of the need for long term treatment and the importance of lifestyle modifications to improve her current health and to decrease her risk of future health problems.  AGREE: Multiple dietary modification options and treatment  options were discussed and  [name] [name] agreed to follow the recommendations documented in the above note.  ARRANGE: [name] [name] was educated on the importance of frequent visits to treat obesity as outlined per CMS and USPSTF guidelines and agreed to schedule her next follow up appointment today.  Migdalia Dk, am acting as transcriptionist for Abby Potash, PA-C I, Abby Potash, PA-C have reviewed above note and agree with its content

## 2018-04-27 DIAGNOSIS — D485 Neoplasm of uncertain behavior of skin: Secondary | ICD-10-CM | POA: Diagnosis not present

## 2018-04-27 DIAGNOSIS — Z85828 Personal history of other malignant neoplasm of skin: Secondary | ICD-10-CM | POA: Diagnosis not present

## 2018-04-27 DIAGNOSIS — D1801 Hemangioma of skin and subcutaneous tissue: Secondary | ICD-10-CM | POA: Diagnosis not present

## 2018-04-27 DIAGNOSIS — L814 Other melanin hyperpigmentation: Secondary | ICD-10-CM | POA: Diagnosis not present

## 2018-04-27 DIAGNOSIS — L570 Actinic keratosis: Secondary | ICD-10-CM | POA: Diagnosis not present

## 2018-04-29 ENCOUNTER — Ambulatory Visit (INDEPENDENT_AMBULATORY_CARE_PROVIDER_SITE_OTHER): Payer: Medicare Other | Admitting: Physician Assistant

## 2018-04-29 VITALS — BP 146/76 | HR 55 | Temp 98.0°F | Ht 63.0 in | Wt 243.0 lb

## 2018-04-29 DIAGNOSIS — E119 Type 2 diabetes mellitus without complications: Secondary | ICD-10-CM

## 2018-04-29 DIAGNOSIS — E559 Vitamin D deficiency, unspecified: Secondary | ICD-10-CM | POA: Diagnosis not present

## 2018-04-29 DIAGNOSIS — Z6841 Body Mass Index (BMI) 40.0 and over, adult: Secondary | ICD-10-CM

## 2018-04-29 MED ORDER — VITAMIN D (ERGOCALCIFEROL) 1.25 MG (50000 UNIT) PO CAPS: 50000.0000 [IU] | ORAL_CAPSULE | ORAL | 0 refills | Status: DC

## 2018-05-03 NOTE — Progress Notes (Signed)
Office: [phone]  /  Fax: [phone]   HPI:   Chief Complaint: OBESITY Uniqua is here to discuss her progress with her obesity treatment plan. She is on the Category 2 plan and is following her eating plan approximately 75-80% of the time. She states she is exercising 0 minutes 0 times per week. Katherleen did well with weight loss. She reports that she has not been eating all of the vegetables on her plan. Her weight is 243 lb (110.2 kg) today and has had a weight loss of 1 pound over a period of 2 weeks since her last visit. She has lost 5 lbs since starting treatment with [name].  Vitamin D deficiency Ryana has a diagnosis of Vitamin D deficiency. She is currently taking prescription Vit D and denies nausea, vomiting or muscle weakness.  Diabetes II Azyria has a diagnosis of diabetes type II and is currently taking metformin. Kyona states fasting blood sugars range between 80 and 125. Last A1c was reported to be 6.7 on 02/01/2018. She has been working on intensive lifestyle modifications including diet, exercise, and weight loss to help control her blood glucose levels. She denies nausea, vomiting, diarrhea, polyphagia, or hypoglycemia.  ASSESSMENT AND PLAN:  Vitamin D deficiency - Plan: Vitamin D, Ergocalciferol, (DRISDOL) 1.25 MG (50000 UT) CAPS capsule  Type 2 diabetes mellitus without complication, without long-term current use of insulin (HCC)  Class 3 severe obesity with serious comorbidity and body mass index (BMI) of 40.0 to 44.9 in adult, unspecified obesity type ([place])  PLAN:  Vitamin D Deficiency Stehanie was informed that low Vitamin D levels contributes to fatigue and are associated with obesity, breast, and colon cancer. She agrees to continue to take prescription Vit D @ 50,000 IU every week #4 with 0 refills and will follow-up for routine testing of Vitamin D, at least 2-3 times per year. She was informed of the risk of over-replacement of Vitamin D and  agrees to not increase her dose unless she discusses this with [name] first. Korinne agrees to follow-up with our clinic in 2 weeks.  Diabetes II Roxie has been given extensive diabetes education by myself today including ideal fasting and post-prandial blood glucose readings, individual ideal Hgb A1c goals  and hypoglycemia prevention. We discussed the importance of good blood sugar control to decrease the likelihood of diabetic complications such as nephropathy, neuropathy, limb loss, blindness, coronary artery disease, and death. We discussed the importance of intensive lifestyle modification including diet, exercise and weight loss as the first line treatment for diabetes. Domitila agrees to continue her metformin and will follow-up at the agreed upon time.  Obesity Shelitha is currently in the action stage of change. As such, her goal is to continue with weight loss efforts. She has agreed to follow the Category 2 plan. Laresha has been instructed to work up to a goal of 150 minutes of combined cardio and strengthening exercise per week for weight loss and overall health benefits. We discussed the following Behavioral Modification Strategies today: increasing vegetables, work on meal planning and easy cooking plans, and keeping healthy foods in the home.  Latina has agreed to follow-up with our clinic in 2 weeks. She was informed of the importance of frequent follow up visits to maximize her success with intensive lifestyle modifications for her multiple health conditions.  ALLERGIES: Allergies  Allergen Reactions  . Codeine Palpitations    MEDICATIONS: Current Outpatient Medications on File Prior to Visit  Medication Sig Dispense Refill  . ACCU-CHEK  AVIVA PLUS test strip U UTD  5  . ACCU-CHEK SOFTCLIX LANCETS lancets U UTD  11  . amLODipine (NORVASC) 10 MG tablet Take 1 tablet (10 mg total) by mouth daily. (Patient taking differently: Take 10 mg by mouth every morning. ) 30 tablet 2   . atorvastatin (LIPITOR) 80 MG tablet Take 1 tablet by mouth every evening.   5  . benazepril (LOTENSIN) 40 MG tablet Take 40 mg by mouth every evening.     . bisoprolol (ZEBETA) 10 MG tablet TAKE 1 TABLET BY MOUTH DAILY 90 tablet 1  . cetirizine (ZYRTEC) 10 MG tablet Take 10 mg by mouth at bedtime.     . chlorthalidone (HYGROTON) 25 MG tablet Take 25 mg by mouth every morning. 1 table once daily  3  . escitalopram (LEXAPRO) 20 MG tablet Take 20 mg by mouth every evening.     . linagliptin (TRADJENTA) 5 MG TABS tablet Take 1 tablet (5 mg total) by mouth every morning. 30 tablet 0  . meloxicam (MOBIC) 15 MG tablet Take 15 mg by mouth every morning.     . metFORMIN (GLUCOPHAGE) 1000 MG tablet Take 1 tablet (1,000 mg total) by mouth 2 (two) times daily with a meal. 60 tablet 0  . modafinil (PROVIGIL) 100 MG tablet Take 1 tablet (100 mg total) by mouth daily. 30 tablet 2  . Multiple Vitamin (MULTIVITAMIN) tablet Take 1 tablet by mouth daily.    Marland Kitchen omeprazole (PRILOSEC) 20 MG capsule Take 20 mg by mouth every morning.     . ondansetron (ZOFRAN) 4 MG tablet Take 1 tablet (4 mg total) by mouth every 8 (eight) hours as needed for nausea or vomiting. 10 tablet 0   No current facility-administered medications on file prior to visit.     PAST MEDICAL HISTORY: Past Medical History:  Diagnosis Date  . Carpal tunnel syndrome of right wrist   . Coronary artery disease    per cath 11-29-2013  mild diffuse CAD and calcification in proximal LAD  . Depression   . Diverticulosis   . GERD (gastroesophageal reflux disease)   . History of endometrial cancer 05/2003   FIGO, Grade 1  s/p  TAG w/ BSO  . HTN (hypertension)   . Hyperlipidemia   . OA (osteoarthritis)   . OSA on CPAP    followed by dr Elsworth Soho-- per study 03-26-2012  mod. to severe OSA , AHI 37/hr  . Type II diabetes mellitus ([place])    followed by pcp  . Urinary incontinence in female   . Vitamin D deficiency     PAST SURGICAL HISTORY: Past  Surgical History:  Procedure Laterality Date  . CARDIAC CATHETERIZATION  11/29/2013   dr Einar Gip   mild diffuse noncritial CAD and mild calcification involving the proximal LAD,  LVEF 55-60%  . CARPAL TUNNEL RELEASE Right 07/17/2017   Procedure: CARPAL TUNNEL RELEASE;  Surgeon: Dorna Leitz, MD;  Location: [place];  Service: Orthopedics;  Laterality: Right;  . KNEE ARTHROSCOPY Left 10/ 2004;  05-17-2008   dr graves  . KNEE ARTHROSCOPY Right 11/2012  . LAPAROSCOPIC CHOLECYSTECTOMY  11-19-2006  dr Zella Richer  [facility]  . LEFT HEART CATHETERIZATION WITH CORONARY ANGIOGRAM N/A 11/29/2013   Procedure: LEFT HEART CATHETERIZATION WITH CORONARY ANGIOGRAM;  Surgeon: Laverda Page, MD;  Location: [facility] CATH LAB;  Service: Cardiovascular;  Laterality: N/A;  . TOTAL ABDOMINAL HYSTERECTOMY W/ BILATERAL SALPINGOOPHORECTOMY  06-20-2003    dr Mohammed Kindle  [facility]   endometrial  cancer  . TOTAL HIP ARTHROPLASTY Right 09/02/2013   Procedure: RIGHT TOTAL HIP ARTHROPLASTY ANTERIOR APPROACH;  Surgeon: Alta Corning, MD;  Location: [place];  Service: Orthopedics;  Laterality: Right;  . TOTAL KNEE ARTHROPLASTY Left 08/02/2012   Procedure: TOTAL KNEE ARTHROPLASTY;  Surgeon: Alta Corning, MD;  Location: [place];  Service: Orthopedics;  Laterality: Left;  left total knee arthroplasty  . TUBAL LIGATION  yrs ago    SOCIAL HISTORY: Social History   Tobacco Use  . Smoking status: Never Smoker  . Smokeless tobacco: Never Used  Substance Use Topics  . Alcohol use: No  . Drug use: No    FAMILY HISTORY: Family History  Problem Relation Age of Onset  . Heart disease Father 63       CABG  . Lung cancer Father 78       lung  . Hypertension Father   . Diabetes Father   . High Cholesterol Father   . Diabetes type II Father   . Heart disease Sister 55       CABG  . Diabetes Sister 27  . Heart disease Brother 10       MI with stents  . Hypertension Mother   . Diabetes Mother   . High Cholesterol Mother   .  Sudden death Mother   . Thyroid disease Mother   . Obesity Mother   . Hypertension Sister        x2   . Diabetes Sister   . Hypertension Brother   . Diabetes Brother   . Cancer Sister        high grade Neuroendocrine carcinoma right axilla  . Pancreatic cancer Other        x 3 - 2 MAunts and 1 P Aunts.   . Lupus Other        Pat Aunt   ROS: Review of Systems  Constitutional: Positive for weight loss.  Gastrointestinal: Negative for diarrhea, nausea and vomiting.  Musculoskeletal:       Negative for muscle weakness.  Endo/Heme/Allergies:       Negative for polyphagia. Negative for hypoglycemia.   PHYSICAL EXAM: Blood pressure (!) 146/76, pulse (!) 55, temperature 98 F (36.7 C), temperature source Oral, height 5\' 3"  (1.6 m), weight 243 lb (110.2 kg), last menstrual period 11/25/2002, SpO2 96 %. Body mass index is 43.05 kg/m. Physical Exam Vitals signs reviewed.  Constitutional:      Appearance: Normal appearance. She is obese.  Cardiovascular:     Rate and Rhythm: Normal rate.     Pulses: Normal pulses.  Pulmonary:     Effort: Pulmonary effort is normal.     Breath sounds: Normal breath sounds.  Musculoskeletal: Normal range of motion.  Skin:    General: Skin is warm and dry.  Neurological:     Mental Status: She is alert and oriented to person, place, and time.  Psychiatric:        Behavior: Behavior normal.   RECENT LABS AND TESTS: BMET    Component Value Date/Time   NA 141 02/01/2018 1125   K 4.5 02/01/2018 1125   CL 100 02/01/2018 1125   CO2 22 02/01/2018 1125   GLUCOSE 121 (H) 02/01/2018 1125   GLUCOSE 112 (H) 07/17/2017 1111   BUN 21 02/01/2018 1125   CREATININE 0.81 02/01/2018 1125   CREATININE 1.24 (H) 07/16/2015 1339   CALCIUM 9.6 02/01/2018 1125   GFRNONAA 76 02/01/2018 1125   GFRAA 88 02/01/2018 1125  Lab Results  Component Value Date   HGBA1C 6.7 (H) 02/01/2018   HGBA1C 6.7 (H) 10/06/2017   HGBA1C 6.9 (H) 04/29/2017   HGBA1C 7.0 (H)  07/16/2015   Lab Results  Component Value Date   INSULIN 21.4 02/01/2018   INSULIN 24.9 10/06/2017   INSULIN 36.0 (H) 04/29/2017   CBC    Component Value Date/Time   WBC 8.4 05/24/2017 1604   RBC 4.41 05/24/2017 1604   HGB 12.9 07/17/2017 1111   HGB 13.1 04/29/2017 0935   HGB 11.2 (L) 07/11/2014 1340   HCT 38.0 07/17/2017 1111   HCT 39.4 04/29/2017 0935   PLT 262 05/24/2017 1604   MCV 91.8 05/24/2017 1604   MCV 88 04/29/2017 0935   MCH 29.9 05/24/2017 1604   MCHC 32.6 05/24/2017 1604   RDW 13.7 05/24/2017 1604   RDW 14.2 04/29/2017 0935   LYMPHSABS 1.8 05/24/2017 1604   LYMPHSABS 2.1 04/29/2017 0935   MONOABS 0.7 05/24/2017 1604   EOSABS 0.1 05/24/2017 1604   EOSABS 0.2 04/29/2017 0935   BASOSABS 0.0 05/24/2017 1604   BASOSABS 0.0 04/29/2017 0935   Iron/TIBC/Ferritin/ %Sat No results found for: IRON, TIBC, FERRITIN, IRONPCTSAT Lipid Panel     Component Value Date/Time   CHOL 228 (H) 02/01/2018 1125   TRIG 377 (H) 02/01/2018 1125   HDL 38 (L) 02/01/2018 1125   CHOLHDL 5.9 (H) 07/16/2015 1339   VLDL NOT CALC 07/16/2015 1339   LDLCALC 115 (H) 02/01/2018 1125   Hepatic Function Panel     Component Value Date/Time   PROT 7.2 02/01/2018 1125   ALBUMIN 4.4 02/01/2018 1125   AST 19 02/01/2018 1125   ALT 25 02/01/2018 1125   ALKPHOS 63 02/01/2018 1125   BILITOT 0.4 02/01/2018 1125      Component Value Date/Time   TSH 3.010 04/29/2017 0935   TSH 3.96 07/16/2015 1339    Ref. Range 02/01/2018 11:25  Vitamin D, 25-Hydroxy Latest Ref Range: 30.0 - 100.0 ng/mL 37.5   OBESITY BEHAVIORAL INTERVENTION VISIT  Today's visit was #20   Starting weight: 248 lbs Starting date: 04/29/2017 Today's weight: 243 lbs  Today's date: 04/29/2018 Total lbs lost to date: 5 At least 15 minutes were spent on discussing the following behavioral intervention visit.    04/29/2018  Height 5\' 3"  (1.6 m)  Weight 243 lb (110.2 kg)  BMI (Calculated) 43.06  BLOOD PRESSURE - SYSTOLIC 025    BLOOD PRESSURE - DIASTOLIC 76   Body Fat % 85.2 %  Total Body Water (lbs) 85.8 lbs   ASK: We discussed the diagnosis of obesity with [name] today and Graylee agreed to give [name] permission to discuss obesity behavioral modification therapy today.  ASSESS: Willodean has the diagnosis of obesity and her BMI today is 43.06 Adaliz is in the action stage of change.   ADVISE: Delora was educated on the multiple health risks of obesity as well as the benefit of weight loss to improve her health. She was advised of the need for long term treatment and the importance of lifestyle modifications to improve her current health and to decrease her risk of future health problems.  AGREE: Multiple dietary modification options and treatment options were discussed and  Lora agreed to follow the recommendations documented in the above note.  ARRANGE: Kynesha was educated on the importance of frequent visits to treat obesity as outlined per CMS and USPSTF guidelines and agreed to schedule her next follow up appointment today.  I, Michaelene Song,  am acting as transcriptionist for Abby Potash, PA-C I, Abby Potash, PA-C have reviewed above note and agree with its content

## 2018-05-04 DIAGNOSIS — M25531 Pain in right wrist: Secondary | ICD-10-CM | POA: Diagnosis not present

## 2018-05-04 DIAGNOSIS — M25532 Pain in left wrist: Secondary | ICD-10-CM | POA: Diagnosis not present

## 2018-05-20 ENCOUNTER — Ambulatory Visit (INDEPENDENT_AMBULATORY_CARE_PROVIDER_SITE_OTHER): Payer: Medicare Other | Admitting: Physician Assistant

## 2018-05-20 ENCOUNTER — Encounter (INDEPENDENT_AMBULATORY_CARE_PROVIDER_SITE_OTHER): Payer: Self-pay

## 2018-06-09 DIAGNOSIS — K760 Fatty (change of) liver, not elsewhere classified: Secondary | ICD-10-CM | POA: Diagnosis not present

## 2018-06-09 DIAGNOSIS — E119 Type 2 diabetes mellitus without complications: Secondary | ICD-10-CM | POA: Diagnosis not present

## 2018-06-09 DIAGNOSIS — Z0000 Encounter for general adult medical examination without abnormal findings: Secondary | ICD-10-CM | POA: Diagnosis not present

## 2018-06-09 DIAGNOSIS — I10 Essential (primary) hypertension: Secondary | ICD-10-CM | POA: Diagnosis not present

## 2018-06-09 DIAGNOSIS — N183 Chronic kidney disease, stage 3 (moderate): Secondary | ICD-10-CM | POA: Diagnosis not present

## 2018-06-09 DIAGNOSIS — Z6841 Body Mass Index (BMI) 40.0 and over, adult: Secondary | ICD-10-CM | POA: Diagnosis not present

## 2018-06-09 DIAGNOSIS — E782 Mixed hyperlipidemia: Secondary | ICD-10-CM | POA: Diagnosis not present

## 2018-06-23 DIAGNOSIS — I10 Essential (primary) hypertension: Secondary | ICD-10-CM | POA: Diagnosis not present

## 2018-06-23 DIAGNOSIS — Z20828 Contact with and (suspected) exposure to other viral communicable diseases: Secondary | ICD-10-CM | POA: Diagnosis not present

## 2018-06-23 DIAGNOSIS — R509 Fever, unspecified: Secondary | ICD-10-CM | POA: Diagnosis not present

## 2018-06-23 DIAGNOSIS — R05 Cough: Secondary | ICD-10-CM | POA: Diagnosis not present

## 2018-06-25 DIAGNOSIS — U071 COVID-19: Secondary | ICD-10-CM | POA: Diagnosis not present

## 2018-06-28 DIAGNOSIS — U071 COVID-19: Secondary | ICD-10-CM | POA: Diagnosis not present

## 2018-06-29 DIAGNOSIS — R509 Fever, unspecified: Secondary | ICD-10-CM | POA: Diagnosis not present

## 2018-06-29 DIAGNOSIS — U071 COVID-19: Secondary | ICD-10-CM | POA: Diagnosis not present

## 2018-06-30 ENCOUNTER — Inpatient Hospital Stay (HOSPITAL_COMMUNITY)
Admission: EM | Admit: 2018-06-30 | Discharge: 2018-07-13 | DRG: 177 | Disposition: A | Discharge status: Home / Self Care | Payer: Medicare Other | Attending: Internal Medicine | Admitting: Internal Medicine

## 2018-06-30 ENCOUNTER — Emergency Department (HOSPITAL_COMMUNITY): Payer: Medicare Other

## 2018-06-30 ENCOUNTER — Encounter (HOSPITAL_COMMUNITY): Payer: Self-pay

## 2018-06-30 ENCOUNTER — Other Ambulatory Visit: Payer: Self-pay

## 2018-06-30 DIAGNOSIS — Z7984 Long term (current) use of oral hypoglycemic drugs: Secondary | ICD-10-CM | POA: Diagnosis not present

## 2018-06-30 DIAGNOSIS — Z96641 Presence of right artificial hip joint: Secondary | ICD-10-CM | POA: Diagnosis not present

## 2018-06-30 DIAGNOSIS — Z8249 Family history of ischemic heart disease and other diseases of the circulatory system: Secondary | ICD-10-CM | POA: Diagnosis not present

## 2018-06-30 DIAGNOSIS — J1289 Other viral pneumonia: Secondary | ICD-10-CM | POA: Diagnosis not present

## 2018-06-30 DIAGNOSIS — E119 Type 2 diabetes mellitus without complications: Secondary | ICD-10-CM | POA: Diagnosis not present

## 2018-06-30 DIAGNOSIS — R109 Unspecified abdominal pain: Secondary | ICD-10-CM

## 2018-06-30 DIAGNOSIS — Z6841 Body Mass Index (BMI) 40.0 and over, adult: Secondary | ICD-10-CM | POA: Diagnosis not present

## 2018-06-30 DIAGNOSIS — Z801 Family history of malignant neoplasm of trachea, bronchus and lung: Secondary | ICD-10-CM | POA: Diagnosis not present

## 2018-06-30 DIAGNOSIS — M1990 Unspecified osteoarthritis, unspecified site: Secondary | ICD-10-CM | POA: Diagnosis present

## 2018-06-30 DIAGNOSIS — J80 Acute respiratory distress syndrome: Secondary | ICD-10-CM | POA: Diagnosis not present

## 2018-06-30 DIAGNOSIS — J9601 Acute respiratory failure with hypoxia: Secondary | ICD-10-CM

## 2018-06-30 DIAGNOSIS — K5790 Diverticulosis of intestine, part unspecified, without perforation or abscess without bleeding: Secondary | ICD-10-CM | POA: Diagnosis present

## 2018-06-30 DIAGNOSIS — Z791 Long term (current) use of non-steroidal anti-inflammatories (NSAID): Secondary | ICD-10-CM

## 2018-06-30 DIAGNOSIS — I110 Hypertensive heart disease with heart failure: Secondary | ICD-10-CM | POA: Diagnosis not present

## 2018-06-30 DIAGNOSIS — U071 COVID-19: Secondary | ICD-10-CM | POA: Diagnosis present

## 2018-06-30 DIAGNOSIS — J1282 Pneumonia due to coronavirus disease 2019: Secondary | ICD-10-CM | POA: Diagnosis present

## 2018-06-30 DIAGNOSIS — G9340 Encephalopathy, unspecified: Secondary | ICD-10-CM | POA: Diagnosis not present

## 2018-06-30 DIAGNOSIS — E782 Mixed hyperlipidemia: Secondary | ICD-10-CM | POA: Diagnosis not present

## 2018-06-30 DIAGNOSIS — E559 Vitamin D deficiency, unspecified: Secondary | ICD-10-CM | POA: Diagnosis not present

## 2018-06-30 DIAGNOSIS — I10 Essential (primary) hypertension: Secondary | ICD-10-CM | POA: Diagnosis not present

## 2018-06-30 DIAGNOSIS — F329 Major depressive disorder, single episode, unspecified: Secondary | ICD-10-CM | POA: Diagnosis present

## 2018-06-30 DIAGNOSIS — Z96652 Presence of left artificial knee joint: Secondary | ICD-10-CM | POA: Diagnosis present

## 2018-06-30 DIAGNOSIS — R0602 Shortness of breath: Secondary | ICD-10-CM | POA: Diagnosis not present

## 2018-06-30 DIAGNOSIS — I2510 Atherosclerotic heart disease of native coronary artery without angina pectoris: Secondary | ICD-10-CM | POA: Diagnosis present

## 2018-06-30 DIAGNOSIS — Z885 Allergy status to narcotic agent status: Secondary | ICD-10-CM | POA: Diagnosis not present

## 2018-06-30 DIAGNOSIS — Z833 Family history of diabetes mellitus: Secondary | ICD-10-CM | POA: Diagnosis not present

## 2018-06-30 DIAGNOSIS — Z8542 Personal history of malignant neoplasm of other parts of uterus: Secondary | ICD-10-CM | POA: Diagnosis not present

## 2018-06-30 DIAGNOSIS — I5032 Chronic diastolic (congestive) heart failure: Secondary | ICD-10-CM | POA: Diagnosis present

## 2018-06-30 DIAGNOSIS — J069 Acute upper respiratory infection, unspecified: Secondary | ICD-10-CM | POA: Diagnosis not present

## 2018-06-30 DIAGNOSIS — K210 Gastro-esophageal reflux disease with esophagitis: Secondary | ICD-10-CM | POA: Diagnosis present

## 2018-06-30 DIAGNOSIS — R0902 Hypoxemia: Secondary | ICD-10-CM | POA: Diagnosis not present

## 2018-06-30 DIAGNOSIS — G4733 Obstructive sleep apnea (adult) (pediatric): Secondary | ICD-10-CM | POA: Diagnosis not present

## 2018-06-30 DIAGNOSIS — E662 Morbid (severe) obesity with alveolar hypoventilation: Secondary | ICD-10-CM | POA: Diagnosis present

## 2018-06-30 DIAGNOSIS — J810 Acute pulmonary edema: Secondary | ICD-10-CM | POA: Diagnosis not present

## 2018-06-30 DIAGNOSIS — R143 Flatulence: Secondary | ICD-10-CM | POA: Diagnosis not present

## 2018-06-30 DIAGNOSIS — J988 Other specified respiratory disorders: Secondary | ICD-10-CM | POA: Diagnosis not present

## 2018-06-30 DIAGNOSIS — Z8349 Family history of other endocrine, nutritional and metabolic diseases: Secondary | ICD-10-CM

## 2018-06-30 DIAGNOSIS — K219 Gastro-esophageal reflux disease without esophagitis: Secondary | ICD-10-CM | POA: Diagnosis not present

## 2018-06-30 DIAGNOSIS — R112 Nausea with vomiting, unspecified: Secondary | ICD-10-CM | POA: Diagnosis not present

## 2018-06-30 LAB — CBC WITH DIFFERENTIAL/PLATELET
Abs Immature Granulocytes: 0.05 10*3/uL (ref 0.00–0.07)
Basophils Absolute: 0 10*3/uL (ref 0.0–0.1)
Basophils Relative: 0 %
Eosinophils Absolute: 0 10*3/uL (ref 0.0–0.5)
Eosinophils Relative: 0 %
HCT: 36 % (ref 36.0–46.0)
Hemoglobin: 11.7 g/dL — ABNORMAL LOW (ref 12.0–15.0)
Immature Granulocytes: 1 %
Lymphocytes Relative: 22 %
Lymphs Abs: 0.9 10*3/uL (ref 0.7–4.0)
MCH: 29.7 pg (ref 26.0–34.0)
MCHC: 32.5 g/dL (ref 30.0–36.0)
MCV: 91.4 fL (ref 80.0–100.0)
Monocytes Absolute: 0.2 10*3/uL (ref 0.1–1.0)
Monocytes Relative: 5 %
Neutro Abs: 2.9 10*3/uL (ref 1.7–7.7)
Neutrophils Relative %: 72 %
Platelets: 159 10*3/uL (ref 150–400)
RBC: 3.94 MIL/uL (ref 3.87–5.11)
RDW: 13.5 % (ref 11.5–15.5)
WBC: 4.1 10*3/uL (ref 4.0–10.5)
nRBC: 0 % (ref 0.0–0.2)

## 2018-06-30 LAB — COMPREHENSIVE METABOLIC PANEL
ALT: 31 U/L (ref 0–44)
AST: 33 U/L (ref 15–41)
Albumin: 3.3 g/dL — ABNORMAL LOW (ref 3.5–5.0)
Alkaline Phosphatase: 38 U/L (ref 38–126)
Anion gap: 12 (ref 5–15)
BUN: 27 mg/dL — ABNORMAL HIGH (ref 8–23)
CO2: 23 mmol/L (ref 22–32)
Calcium: 8 mg/dL — ABNORMAL LOW (ref 8.9–10.3)
Chloride: 100 mmol/L (ref 98–111)
Creatinine, Ser: 1.22 mg/dL — ABNORMAL HIGH (ref 0.44–1.00)
GFR calc Af Amer: 53 mL/min — ABNORMAL LOW (ref >60)
GFR calc non Af Amer: 46 mL/min — ABNORMAL LOW (ref >60)
Glucose, Bld: 135 mg/dL — ABNORMAL HIGH (ref 70–99)
Potassium: 3.7 mmol/L (ref 3.5–5.1)
Sodium: 135 mmol/L (ref 135–145)
Total Bilirubin: 0.4 mg/dL (ref 0.3–1.2)
Total Protein: 7.1 g/dL (ref 6.5–8.1)

## 2018-06-30 LAB — C-REACTIVE PROTEIN: CRP: 5.2 mg/dL — ABNORMAL HIGH (ref <1.0)

## 2018-06-30 LAB — FIBRINOGEN: Fibrinogen: 582 mg/dL — ABNORMAL HIGH (ref 210–475)

## 2018-06-30 LAB — TRIGLYCERIDES: Triglycerides: 199 mg/dL — ABNORMAL HIGH (ref <150)

## 2018-06-30 LAB — LACTIC ACID, PLASMA
Lactic Acid, Venous: 1.6 mmol/L (ref 0.5–1.9)
Lactic Acid, Venous: 1.1 mmol/L (ref 0.5–1.9)

## 2018-06-30 LAB — FERRITIN: Ferritin: 138 ng/mL (ref 11–307)

## 2018-06-30 LAB — TROPONIN I: Troponin I: <0.03 ng/mL (ref <0.03)

## 2018-06-30 LAB — LACTATE DEHYDROGENASE: LDH: 267 U/L — ABNORMAL HIGH (ref 98–192)

## 2018-06-30 LAB — D-DIMER, QUANTITATIVE: D-Dimer, Quant: 1.15 ug/mL-FEU — ABNORMAL HIGH (ref 0.00–0.50)

## 2018-06-30 LAB — PROCALCITONIN: Procalcitonin: <0.1 ng/mL

## 2018-06-30 MED ORDER — INSULIN ASPART 100 UNIT/ML ~~LOC~~ SOLN
0.0000 [IU] | SUBCUTANEOUS | Status: DC
  Administered 2018-06-30: 1 [IU] via SUBCUTANEOUS
  Administered 2018-07-01: 15:00:00 3 [IU] via SUBCUTANEOUS
  Administered 2018-07-01: 2 [IU] via SUBCUTANEOUS
  Administered 2018-07-01: 19:00:00 3 [IU] via SUBCUTANEOUS
  Administered 2018-07-01: 5 [IU] via SUBCUTANEOUS
  Administered 2018-07-01 (×2): 1 [IU] via SUBCUTANEOUS
  Administered 2018-07-01: 01:00:00 2 [IU] via SUBCUTANEOUS
  Administered 2018-07-02 (×2): 5 [IU] via SUBCUTANEOUS
  Filled 2018-06-30: qty 1

## 2018-06-30 MED ORDER — SODIUM CHLORIDE 0.9 % IV SOLN
1.0000 g | Freq: Once | INTRAVENOUS | Status: AC
  Administered 2018-06-30: 1 g via INTRAVENOUS
  Filled 2018-06-30: qty 10

## 2018-06-30 MED ORDER — SODIUM CHLORIDE 0.9 % IV SOLN
500.0000 mg | Freq: Once | INTRAVENOUS | Status: AC
  Administered 2018-06-30: 500 mg via INTRAVENOUS
  Filled 2018-06-30: qty 500

## 2018-06-30 MED ORDER — ENOXAPARIN SODIUM 120 MG/0.8ML ~~LOC~~ SOLN
110.0000 mg | SUBCUTANEOUS | Status: AC
  Administered 2018-07-01: 01:00:00 110 mg via SUBCUTANEOUS
  Filled 2018-06-30 (×2): qty 0.73

## 2018-06-30 NOTE — ED Triage Notes (Signed)
Pt was dx with covid 06/23/18. Pt states she was satting 77-78% on home pulse ox. Pt has had near continuous cough.

## 2018-06-30 NOTE — ED Notes (Signed)
[facility] at bedside 

## 2018-06-30 NOTE — ED Notes (Signed)
Report given to RN [id number] at [facility]

## 2018-06-30 NOTE — ED Provider Notes (Signed)
[facility] DEPT Provider Note   CSN: [id number] Arrival date & time: 06/30/18  1706    History   Chief Complaint Chief Complaint  Patient presents with   Shortness of Breath    HPI [name] [name] is a 68 y.o. female.     68 yo F with a chief complaint shortness of breath.  Patient was diagnosed with the novel coronavirus about a week ago.  Since then she has had progressive shortness of breath and fatigue.  Continue to have cough and fever.  Has had some nausea and diarrhea.  She had a virtual visit with her doctor today and had a pulse ox at home that was 85% was told to come to the ED for evaluation.  The history is provided by the patient.  Shortness of Breath  Severity:  Moderate Onset quality:  Gradual Duration:  1 week Timing:  Constant Progression:  Worsening Chronicity:  Recurrent Relieved by:  Nothing Worsened by:  Nothing Ineffective treatments:  None tried Associated symptoms: cough and vomiting   Associated symptoms: no abdominal pain, no chest pain, no fever, no headaches and no wheezing     Past Medical History:  Diagnosis Date   Carpal tunnel syndrome of right wrist    Coronary artery disease    per cath 11-29-2013  mild diffuse CAD and calcification in proximal LAD   Depression    Diverticulosis    GERD (gastroesophageal reflux disease)    History of endometrial cancer 05/2003   FIGO, Grade 1  s/p  TAG w/ BSO   HTN (hypertension)    Hyperlipidemia    OA (osteoarthritis)    OSA on CPAP    followed by dr Elsworth Soho-- per study 03-26-2012  mod. to severe OSA , AHI 37/hr   Type II diabetes mellitus ([place])    followed by pcp   Urinary incontinence in female    Vitamin D deficiency     Patient Active Problem List   Diagnosis Date Noted   Acute respiratory disease due to COVID-19 virus 06/30/2018   DM (diabetes mellitus), type 2 ([place]) 06/30/2018   Essential hypertension 06/30/2018   Right carpal tunnel  syndrome 07/17/2017   Other fatigue 04/29/2017   Shortness of breath on exertion 04/29/2017   Vitamin D deficiency 04/29/2017   Hypersomnolence 72/53/6644   Nonalcoholic fatty liver disease without nonalcoholic steatohepatitis ([facility]) 02/12/2016   Gastroesophageal reflux disease with esophagitis 11/23/2014   History of endometrial cancer 08/25/2014   Mixed hyperlipidemia 08/25/2014   Morbid obesity with alveolar hypoventilation ([place]) 08/25/2014   Bilateral carotid bruits 11/30/2013   Dyspnea on exertion 11/30/2013   Osteoarthritis of right hip 09/02/2013   Cystocele 07/03/2013   Vaginal pessary present 04/25/2013   Prolapse of vaginal vault after hysterectomy 04/25/2013   Endometrial cancer, grade I ([place]) 04/25/2013   Unspecified vitamin D deficiency 04/25/2013   Personal history of other medical treatment 04/25/2013   Osteoarthritis of left knee 08/02/2012   Stress incontinence 06/07/2012   Essential hypertension with goal blood pressure less than 130/85 06/07/2012   Diabetes ([place]) 06/07/2012   Obstructive sleep apnea 02/16/2012    Past Surgical History:  Procedure Laterality Date   CARDIAC CATHETERIZATION  11/29/2013   dr Einar Gip   mild diffuse noncritial CAD and mild calcification involving the proximal LAD,  LVEF 55-60%   CARPAL TUNNEL RELEASE Right 07/17/2017   Procedure: CARPAL TUNNEL RELEASE;  Surgeon: Dorna Leitz, MD;  Location: [facility];  Service: Orthopedics;  Laterality: Right;   KNEE ARTHROSCOPY Left 10/ 2004;  05-17-2008   dr graves   KNEE ARTHROSCOPY Right 11/2012   LAPAROSCOPIC CHOLECYSTECTOMY  11-19-2006  dr Zella Richer  [facility]   LEFT HEART CATHETERIZATION WITH CORONARY ANGIOGRAM N/A 11/29/2013   Procedure: LEFT HEART CATHETERIZATION WITH CORONARY ANGIOGRAM;  Surgeon: Laverda Page, MD;  Location: [facility] CATH LAB;  Service: Cardiovascular;  Laterality: N/A;   TOTAL ABDOMINAL HYSTERECTOMY W/ BILATERAL SALPINGOOPHORECTOMY   06-20-2003    dr Mohammed Kindle  [facility]   endometrial cancer   TOTAL HIP ARTHROPLASTY Right 09/02/2013   Procedure: RIGHT TOTAL HIP ARTHROPLASTY ANTERIOR APPROACH;  Surgeon: Alta Corning, MD;  Location: [facility];  Service: Orthopedics;  Laterality: Right;   TOTAL KNEE ARTHROPLASTY Left 08/02/2012   Procedure: TOTAL KNEE ARTHROPLASTY;  Surgeon: Alta Corning, MD;  Location: [facility];  Service: Orthopedics;  Laterality: Left;  left total knee arthroplasty   TUBAL LIGATION  yrs ago     OB History    Gravida  3   Para  3   Term  3   Preterm  0   AB  0   Living  3     SAB  0   TAB  0   Ectopic  0   Multiple  0   Live Births  3            Home Medications    Prior to Admission medications   Medication Sig Start Date End Date Taking? Authorizing Provider  amLODipine (NORVASC) 10 MG tablet Take 1 tablet (10 mg total) by mouth daily. Patient taking differently: Take 10 mg by mouth every morning.  11/30/13  Yes Adrian Prows, MD  atorvastatin (LIPITOR) 80 MG tablet Take 1 tablet by mouth every evening.  04/11/15  Yes [provider]  benazepril (LOTENSIN) 40 MG tablet Take 40 mg by mouth every evening.    Yes [provider]  bisoprolol (ZEBETA) 10 MG tablet TAKE 1 TABLET BY MOUTH DAILY 04/07/18  Yes Miquel Dunn, NP  cetirizine (ZYRTEC) 10 MG tablet Take 10 mg by mouth at bedtime.    Yes [provider]  chlorthalidone (HYGROTON) 25 MG tablet Take 25 mg by mouth every morning. 1 table once daily 12/30/13  Yes [provider]  escitalopram (LEXAPRO) 20 MG tablet Take 20 mg by mouth every evening.    Yes [provider]  glimepiride (AMARYL) 4 MG tablet Take 2 mg by mouth daily with breakfast.  02/14/18  Yes [provider]  linagliptin (TRADJENTA) 5 MG TABS tablet Take 1 tablet (5 mg total) by mouth every morning. 02/01/18  Yes Abby Potash, PA-C  meloxicam (MOBIC) 15 MG tablet Take 15 mg by mouth every morning.    Yes [provider]  metFORMIN (GLUCOPHAGE) 1000 MG tablet Take 1 tablet (1,000 mg total) by mouth 2 (two) times daily with a meal. 12/01/17  Yes Abby Potash, PA-C  Multiple Vitamin (MULTIVITAMIN) tablet Take 1 tablet by mouth daily.   Yes [provider]  omeprazole (PRILOSEC) 20 MG capsule Take 20 mg by mouth every morning.    Yes [provider]  ondansetron (ZOFRAN) 4 MG tablet Take 1 tablet (4 mg total) by mouth every 8 (eight) hours as needed for nausea or vomiting. 05/24/17  Yes Margarita Mail, PA-C  ACCU-CHEK AVIVA PLUS test strip U UTD 06/12/17   [provider]  ACCU-CHEK SOFTCLIX LANCETS lancets U UTD 06/12/17   [provider]  modafinil (PROVIGIL) 100 MG tablet Take 1 tablet (100 mg total) by mouth daily. Patient not taking: Reported on 06/30/2018 12/17/17   Rigoberto Noel, MD  Vitamin D, Ergocalciferol, (DRISDOL) 1.25 MG (50000 UT) CAPS capsule Take 1 capsule (50,000 Units total) by mouth every 3 (three) days. 04/29/18   Abby Potash, PA-C    Family History Family History  Problem Relation Age of Onset   Heart disease Father 63       CABG   Lung cancer Father 2       lung   Hypertension Father    Diabetes Father    High Cholesterol Father    Diabetes type II Father    Heart disease Sister 40       CABG   Diabetes Sister 71   Heart disease Brother 62       MI with stents   Hypertension Mother    Diabetes Mother    High Cholesterol Mother    Sudden death Mother    Thyroid disease Mother    Obesity Mother    Hypertension Sister        x2    Diabetes Sister    Hypertension Brother    Diabetes Brother    Cancer Sister        high grade Neuroendocrine carcinoma right axilla   Pancreatic cancer Other        x 3 - 2 MAunts and 1 P Aunts.    Lupus Other        Field seismologist    Social History Social History   Tobacco Use   Smoking status: Never Smoker   Smokeless tobacco: Never Used  Substance Use Topics    Alcohol use: No   Drug use: No     Allergies   Codeine   Review of Systems Review of Systems  Constitutional: Negative for chills and fever.  HENT: Positive for congestion. Negative for rhinorrhea.   Eyes: Negative for redness and visual disturbance.  Respiratory: Positive for cough and shortness of breath. Negative for wheezing.   Cardiovascular: Negative for chest pain and palpitations.  Gastrointestinal: Positive for diarrhea, nausea and vomiting. Negative for abdominal pain.  Genitourinary: Negative for dysuria and urgency.  Musculoskeletal: Negative for arthralgias and myalgias.  Skin: Negative for pallor and wound.  Neurological: Negative for dizziness and headaches.     Physical Exam Updated Vital Signs BP (!) 135/54    Pulse 70    Temp (!) 100.9 F (38.3 C) (Oral)    Resp (!) 22    Wt 110 kg    LMP 11/25/2002 (Approximate)    SpO2 94%    BMI 42.96 kg/m   Physical Exam Vitals signs and nursing note reviewed.  Constitutional:      General: She is not in acute distress.    Appearance: She is well-developed. She is not diaphoretic.  HENT:     Head: Normocephalic and atraumatic.  Eyes:     Pupils: Pupils are equal, round, and reactive to light.  Neck:     Musculoskeletal: Normal range of motion and neck supple.  Cardiovascular:     Rate and Rhythm: Normal rate and regular rhythm.     Heart sounds: No murmur. No friction rub. No gallop.   Pulmonary:     Effort: Pulmonary effort is normal. Tachypnea present.     Breath sounds: Decreased breath sounds (in all fields) present. No wheezing or rales.  Abdominal:     General: There is  no distension.     Palpations: Abdomen is soft.     Tenderness: There is no abdominal tenderness.  Musculoskeletal:        General: No tenderness.  Skin:    General: Skin is warm and dry.  Neurological:     Mental Status: She is alert and oriented to person, place, and time.  Psychiatric:        Behavior: Behavior normal.       ED Treatments / Results  Labs (all labs ordered are listed, but only abnormal results are displayed) Labs Reviewed  CBC WITH DIFFERENTIAL/PLATELET - Abnormal; Notable for the following components:      Result Value   Hemoglobin 11.7 (*)    All other components within normal limits  COMPREHENSIVE METABOLIC PANEL - Abnormal; Notable for the following components:   Glucose, Bld 135 (*)    BUN 27 (*)    Creatinine, Ser 1.22 (*)    Calcium 8.0 (*)    Albumin 3.3 (*)    GFR calc non Af Amer 46 (*)    GFR calc Af Amer 53 (*)    All other components within normal limits  D-DIMER, QUANTITATIVE (NOT AT [facility]) - Abnormal; Notable for the following components:   D-Dimer, Quant 1.15 (*)    All other components within normal limits  LACTATE DEHYDROGENASE - Abnormal; Notable for the following components:   LDH 267 (*)    All other components within normal limits  TRIGLYCERIDES - Abnormal; Notable for the following components:   Triglycerides 199 (*)    All other components within normal limits  FIBRINOGEN - Abnormal; Notable for the following components:   Fibrinogen 582 (*)    All other components within normal limits  C-REACTIVE PROTEIN - Abnormal; Notable for the following components:   CRP 5.2 (*)    All other components within normal limits  CULTURE, BLOOD (ROUTINE X 2)  CULTURE, BLOOD (ROUTINE X 2)  LACTIC ACID, PLASMA  LACTIC ACID, PLASMA  PROCALCITONIN  FERRITIN  HEMOGLOBIN A1C    EKG EKG Interpretation  Date/Time:  Wednesday Jun 30 2018 17:20:39 EDT Ventricular Rate:  70 PR Interval:    QRS Duration: 93 QT Interval:  400 QTC Calculation: 432 R Axis:   149 Text Interpretation:  Right and left arm electrode reversal, interpretation assumes no reversal Sinus rhythm Right axis deviation Borderline repolarization abnormality No significant change since last tracing Confirmed by Deno Etienne [phone]) on 06/30/2018 5:30:30 PM   Radiology Dg Chest Port 1 View  Result  Date: 06/30/2018 CLINICAL DATA:  Shortness of breath EXAM: PORTABLE CHEST 1 VIEW COMPARISON:  11/28/2013 FINDINGS: The cardiac silhouette is enlarged. There is diffuse bilateral airspace opacities. No pneumothorax. Trace bilateral pleural effusions are suspected. There is no definite acute osseous abnormality. IMPRESSION: 1. Diffuse bilateral pulmonary airspace opacities concerning for multifocal pneumonia (viral or bacterial). Pulmonary edema can have a similar appearance. 2.  Mild cardiomegaly. Electronically Signed   By: Constance Holster M.D.   On: 06/30/2018 18:44    Procedures Procedures (including critical care time)  Medications Ordered in ED Medications  insulin aspart (novoLOG) injection 0-9 Units (has no administration in time range)  cefTRIAXone (ROCEPHIN) 1 g in sodium chloride 0.9 % 100 mL IVPB (1 g Intravenous New Bag/Given 06/30/18 1949)  azithromycin (ZITHROMAX) 500 mg in sodium chloride 0.9 % 250 mL IVPB (500 mg Intravenous New Bag/Given 06/30/18 1947)     Initial Impression / Assessment and Plan / ED Course  I  have reviewed the triage vital signs and the nursing notes.  Pertinent labs & imaging results that were available during my care of the patient were reviewed by me and considered in my medical decision making (see chart for details).        68 yo F with a chief complaint of shortness of breath.  Patient has been tested positive for the novel coronavirus.  Her oxygen saturation on room air on arrival was in the 70s.  Patient improved significantly with 4 L of oxygen.  Will obtain portable chest x-ray lab work and admit.  CXR with multifocal pneumonia.  Unlikely to be bacterial with covid infection, but will cover with antibiotics.  Discuss with hospitalist.   CRITICAL CARE Performed by: Cecilio Asper   Total critical care time: 35 minutes  Critical care time was exclusive of separately billable procedures and treating other patients.  Critical care was  necessary to treat or prevent imminent or life-threatening deterioration.  Critical care was time spent personally by me on the following activities: development of treatment plan with patient and/or surrogate as well as nursing, discussions with consultants, evaluation of patient's response to treatment, examination of patient, obtaining history from patient or surrogate, ordering and performing treatments and interventions, ordering and review of laboratory studies, ordering and review of radiographic studies, pulse oximetry and re-evaluation of patient's condition.  The patients results and plan were reviewed and discussed.   Any x-rays performed were independently reviewed by myself.   Differential diagnosis were considered with the presenting HPI.  Medications  insulin aspart (novoLOG) injection 0-9 Units (has no administration in time range)  cefTRIAXone (ROCEPHIN) 1 g in sodium chloride 0.9 % 100 mL IVPB (1 g Intravenous New Bag/Given 06/30/18 1949)  azithromycin (ZITHROMAX) 500 mg in sodium chloride 0.9 % 250 mL IVPB (500 mg Intravenous New Bag/Given 06/30/18 1947)    Vitals:   06/30/18 1900 06/30/18 1930 06/30/18 2000 06/30/18 2030  BP: (!) 142/50 (!) 144/63 (!) 141/120 (!) 135/54  Pulse: 69 69 76 70  Resp: (!) 26  (!) 24 (!) 22  Temp:      TempSrc:      SpO2: 96% 95% 97% 94%  Weight:        Final diagnoses:  Acute respiratory disease due to COVID-19 virus    Admission/ observation were discussed with the admitting physician, patient and/or family and they are comfortable with the plan.    Final Clinical Impressions(s) / ED Diagnoses   Final diagnoses:  Acute respiratory disease due to COVID-19 virus    ED Discharge Orders    None       Deno Etienne, DO 06/30/18 2107

## 2018-06-30 NOTE — ED Notes (Signed)
Bedside commode placed.  

## 2018-06-30 NOTE — ED Notes (Signed)
Pt satting 96% on 4L

## 2018-06-30 NOTE — ED Notes (Signed)
ED TO INPATIENT HANDOFF REPORT  Name/Age/Gender [name] [name] 68 y.o. female  Code Status Code Status History    Date Active Date Inactive Code Status Order ID Comments User Context   11/29/2013 1958 11/30/2013 1340 Full Code [id number]  Adrian Prows, MD Inpatient   11/29/2013 1704 11/29/2013 1958 Full Code [id number]  Adrian Prows, MD ED   09/02/2013 1708 09/03/2013 1908 Full Code [id number]  Penelope Coop Inpatient   08/02/2012 1639 08/04/2012 1709 Full Code [id number]  Erlene Senters, PA-C Inpatient      Home/SNF/Other Home  Chief Complaint Covid-19 positive, SOB, Decreased stats  Level of Care/Admitting Diagnosis ED Disposition    ED Disposition Condition [facility] Area: [place] [[id number]]  Level of Care: Progressive [102]  Covid Evaluation: Confirmed COVID Positive  Isolation Risk Level: High Risk/Airborne (Aerosolizing procedure, nebulizer, intubated/ventilation, CPAP/BiPAP)  Diagnosis: Acute respiratory disease due to COVID-19 virus [[id number]]  Admitting Physician: Toy Baker [[id number]]  Attending Physician: Toy Baker [[id number]]  Estimated length of stay: 3 - 4 days  Certification:: I certify this patient will need inpatient services for at least 2 midnights  PT Class (Do Not Modify): Inpatient [101]  PT Acc Code (Do Not Modify): Private [1]       Medical History Past Medical History:  Diagnosis Date  . Carpal tunnel syndrome of right wrist   . Coronary artery disease    per cath 11-29-2013  mild diffuse CAD and calcification in proximal LAD  . Depression   . Diverticulosis   . GERD (gastroesophageal reflux disease)   . History of endometrial cancer 05/2003   FIGO, Grade 1  s/p  TAG w/ BSO  . HTN (hypertension)   . Hyperlipidemia   . OA (osteoarthritis)   . OSA on CPAP    followed by dr Elsworth Soho-- per study 03-26-2012  mod. to severe OSA , AHI 37/hr  . Type II diabetes mellitus ([place])    followed by pcp  .  Urinary incontinence in female   . Vitamin D deficiency     Allergies Allergies  Allergen Reactions  . Codeine Palpitations    IV Location/Drains/Wounds Patient Lines/Drains/Airways Status   Active Line/Drains/Airways    Name:   Placement date:   Placement time:   Site:   Days:   Peripheral IV 06/30/18 Left Antecubital   06/30/18    1745    Antecubital   less than 1   Peripheral IV 06/30/18 Right Antecubital   06/30/18    1948    Antecubital   less than 1   Incision (Closed) 09/02/13 Hip Right   09/02/13    1359     1762   Incision (Closed) 07/17/17 Hand Right   07/17/17    1301     348          Labs/Imaging Results for orders placed or performed during the hospital encounter of 06/30/18 (from the past 48 hour(s))  Lactic acid, plasma     Status: None   Collection Time: 06/30/18  5:27 PM  Result Value Ref Range   Lactic Acid, Venous 1.6 0.5 - 1.9 mmol/L    Comment: Performed at [facility], [place] [address], [place], [place] [address]  CBC WITH DIFFERENTIAL     Status: Abnormal   Collection Time: 06/30/18  5:46 PM  Result Value Ref Range   WBC 4.1 4.0 - 10.5 K/uL   RBC 3.94 3.87 - 5.11 MIL/uL  Hemoglobin 11.7 (L) 12.0 - 15.0 g/dL   HCT 36.0 36.0 - 46.0 %   MCV 91.4 80.0 - 100.0 fL   MCH 29.7 26.0 - 34.0 pg   MCHC 32.5 30.0 - 36.0 g/dL   RDW 13.5 11.5 - 15.5 %   Platelets 159 150 - 400 K/uL   nRBC 0.0 0.0 - 0.2 %   Neutrophils Relative % 72 %   Neutro Abs 2.9 1.7 - 7.7 K/uL   Lymphocytes Relative 22 %   Lymphs Abs 0.9 0.7 - 4.0 K/uL   Monocytes Relative 5 %   Monocytes Absolute 0.2 0.1 - 1.0 K/uL   Eosinophils Relative 0 %   Eosinophils Absolute 0.0 0.0 - 0.5 K/uL   Basophils Relative 0 %   Basophils Absolute 0.0 0.0 - 0.1 K/uL   Immature Granulocytes 1 %   Abs Immature Granulocytes 0.05 0.00 - 0.07 K/uL    Comment: Performed at [facility], [place] [address] [place], [place] [address]  Comprehensive metabolic panel      Status: Abnormal   Collection Time: 06/30/18  5:46 PM  Result Value Ref Range   Sodium 135 135 - 145 mmol/L   Potassium 3.7 3.5 - 5.1 mmol/L   Chloride 100 98 - 111 mmol/L   CO2 23 22 - 32 mmol/L   Glucose, Bld 135 (H) 70 - 99 mg/dL   BUN 27 (H) 8 - 23 mg/dL   Creatinine, Ser 1.22 (H) 0.44 - 1.00 mg/dL   Calcium 8.0 (L) 8.9 - 10.3 mg/dL   Total Protein 7.1 6.5 - 8.1 g/dL   Albumin 3.3 (L) 3.5 - 5.0 g/dL   AST 33 15 - 41 U/L   ALT 31 0 - 44 U/L   Alkaline Phosphatase 38 38 - 126 U/L   Total Bilirubin 0.4 0.3 - 1.2 mg/dL   GFR calc non Af Amer 46 (L) >60 mL/min   GFR calc Af Amer 53 (L) >60 mL/min   Anion gap 12 5 - 15    Comment: Performed at [facility], [place] [address], [place], [place] [address]  D-dimer, quantitative     Status: Abnormal   Collection Time: 06/30/18  5:46 PM  Result Value Ref Range   D-Dimer, Quant 1.15 (H) 0.00 - 0.50 ug/mL-FEU    Comment: (NOTE) At the manufacturer cut-off of 0.50 ug/mL FEU, this assay has been documented to exclude PE with a sensitivity and negative predictive value of 97 to 99%.  At this time, this assay has not been approved by the FDA to exclude DVT/VTE. Results should be correlated with clinical presentation. Performed at [facility], [place] [address] [place], [place] [address]   Procalcitonin     Status: None   Collection Time: 06/30/18  5:46 PM  Result Value Ref Range   Procalcitonin <0.10 ng/mL    Comment:        Interpretation: PCT (Procalcitonin) <= 0.5 ng/mL: Systemic infection (sepsis) is not likely. Local bacterial infection is possible. (NOTE)       Sepsis PCT Algorithm           Lower Respiratory Tract                                      Infection PCT Algorithm    ----------------------------     ----------------------------         PCT <  0.25 ng/mL                PCT < 0.10 ng/mL         Strongly encourage             Strongly discourage   discontinuation of antibiotics     initiation of antibiotics    ----------------------------     -----------------------------       PCT 0.25 - 0.50 ng/mL            PCT 0.10 - 0.25 ng/mL               OR       >80% decrease in PCT            Discourage initiation of                                            antibiotics      Encourage discontinuation           of antibiotics    ----------------------------     -----------------------------         PCT >= 0.50 ng/mL              PCT 0.26 - 0.50 ng/mL               AND        <80% decrease in PCT             Encourage initiation of                                             antibiotics       Encourage continuation           of antibiotics    ----------------------------     -----------------------------        PCT >= 0.50 ng/mL                  PCT > 0.50 ng/mL               AND         increase in PCT                  Strongly encourage                                      initiation of antibiotics    Strongly encourage escalation           of antibiotics                                     -----------------------------                                           PCT <= 0.25 ng/mL  OR                                        > 80% decrease in PCT                                     Discontinue / Do not initiate                                             antibiotics Performed at [place] [address] [place], [place] [address]   Lactate dehydrogenase     Status: Abnormal   Collection Time: 06/30/18  5:46 PM  Result Value Ref Range   LDH 267 (H) 98 - 192 U/L    Comment: Performed at [facility], [place] [address], [place], [place] [address]  Ferritin     Status: None   Collection Time: 06/30/18  5:46 PM  Result Value Ref Range   Ferritin 138 11 - 307 ng/mL    Comment: Performed at [facility], [place] [address] [place], [place] [address]  Triglycerides      Status: Abnormal   Collection Time: 06/30/18  5:46 PM  Result Value Ref Range   Triglycerides 199 (H) <150 mg/dL    Comment: Performed at [facility], [place] [address], [place], [place] [address]  Fibrinogen     Status: Abnormal   Collection Time: 06/30/18  5:46 PM  Result Value Ref Range   Fibrinogen 582 (H) 210 - 475 mg/dL    Comment: Performed at [facility], [place] [address], [place], [place] [address]  C-reactive protein     Status: Abnormal   Collection Time: 06/30/18  5:46 PM  Result Value Ref Range   CRP 5.2 (H) <1.0 mg/dL    Comment: Performed at [facility], [place] [address], [place], [place] [address]   Dg Chest Port 1 View  Result Date: 06/30/2018 CLINICAL DATA:  Shortness of breath EXAM: PORTABLE CHEST 1 VIEW COMPARISON:  11/28/2013 FINDINGS: The cardiac silhouette is enlarged. There is diffuse bilateral airspace opacities. No pneumothorax. Trace bilateral pleural effusions are suspected. There is no definite acute osseous abnormality. IMPRESSION: 1. Diffuse bilateral pulmonary airspace opacities concerning for multifocal pneumonia (viral or bacterial). Pulmonary edema can have a similar appearance. 2.  Mild cardiomegaly. Electronically Signed   By: Constance Holster M.D.   On: 06/30/2018 18:44    Pending Labs Unresulted Labs (From admission, onward)    Start     Ordered   07/01/18 0500  Hemoglobin A1c  Tomorrow morning,   R    Comments:  To assess prior glycemic control    06/30/18 1953   06/30/18 1727  Lactic acid, plasma  Now then every 2 hours,   STAT     06/30/18 1727   06/30/18 1727  Blood Culture (routine x 2)  BLOOD CULTURE X 2,   STAT     06/30/18 1727          Vitals/Pain Today's Vitals   06/30/18 1759 06/30/18 1845 06/30/18 1900 06/30/18 1930  BP: (!) 136/99 (!) 143/54 (!) 142/50 (!) 144/63  Pulse: 72 69 69  69  Resp: (!) 30 (!) 27 (!) 26   Temp:      TempSrc:      SpO2: 96% 95% 96% 95%  Weight:       PainSc:        Isolation Precautions Airborne and Contact precautions  Medications Medications  cefTRIAXone (ROCEPHIN) 1 g in sodium chloride 0.9 % 100 mL IVPB (1 g Intravenous New Bag/Given 06/30/18 1949)  azithromycin (ZITHROMAX) 500 mg in sodium chloride 0.9 % 250 mL IVPB (500 mg Intravenous New Bag/Given 06/30/18 1947)  insulin aspart (novoLOG) injection 0-9 Units (has no administration in time range)    Mobility walks with person assist

## 2018-06-30 NOTE — H&P (Signed)
[name] [name]:[id number] DOB: [birth year] DOA: 06/30/2018     PCP: Vicenta Aly, FNP   Outpatient Specialists:   CARDS:  Dr. Einar Gip  . Pulmonary   Dr. Elsworth Soho      Patient arrived to ER on 06/30/18 at [place]  Patient coming from: home Lives  With family    Chief Complaint:  Chief Complaint  Patient presents with   Shortness of Breath    HPI: [name] [name] is a 68 y.o. female with medical history significant of recent diagnosis of COVID19  DM2 and obesity, HTN, HLD, GERD, OSA on CPAP, CAD    Presented with   hypoxia oxygen saturation between 77 to 70% persistent cough and up to 4 L in emergency department satting 96%. Patient was having worsening cough and finally finally was able to obtain pulse ox which registered initially 85% a checked family members and everybody else was registering normal at which point they get a hold of her primary care provider who recommended for patient to go to emergency department Has been productive of white pink-tinged sputum, headache, muscle pain  Son has also tested positive for COVID and staying with her.  He has minimal symptoms.  She is currently retired but both her son and adult daughter working for [employer] Patient has been febrile with fever up to 102-102.4 with minimal improvement with acetaminophen To have had some associated nausea and diarrhea KNOWN DIAGNOSIS OF COVID-19 diagnosed at [facility]   Infectious risk factors:  Reports  fever, shortness of breath,  cough, chest pain, Sore throat,   anosmia/change in taste, N/V/Diarrhea/abdominal pain,  Body aches, severe fatigue sick contacts, known COVID 19 exposure,    no rash  Regarding pertinent Chronic problems: History of hypertension on benazepril History  of diabetes on metformin and Trandjenta   While in ER:  X-ray showing multifocal pneumonia Oxygen saturation improved with 4 L of oxygen empirically  Covered with antibiotics  The following Work up has  been ordered so far:  Orders Placed This Encounter  Procedures   Blood Culture (routine x 2)   DG Chest Port 1 View   Lactic acid, plasma   CBC WITH DIFFERENTIAL   Comprehensive metabolic panel   D-dimer, quantitative   Procalcitonin   Lactate dehydrogenase   Ferritin   Triglycerides   Fibrinogen   C-reactive protein   Diet NPO time specified   Cardiac monitoring   Insert peripheral IV x 2   Initiate Carrier Fluid Protocol   Place surgical mask on patient   Patient to wear surgical mask during transportation   Assess patient for ability to self-prone. If able (can move self in bed, ambulate) and stable (SpO2 and oxygen requirement):   Notify EDP if new oxygen requirements escalates > 4L per minute [place]   RN to draw the following extra tubes:   Place on droplet/contact if:   Place on airborne/contact if:   RN/NT - Document specific oxygen requirements in [facility]   Consult to hospitalist   Pulse oximetry, continuous   EKG 12-Lead   ED EKG 12-Lead     Following Medications were ordered in ER: Medications  cefTRIAXone (ROCEPHIN) 1 g in sodium chloride 0.9 % 100 mL IVPB (has no administration in time range)  azithromycin (ZITHROMAX) 500 mg in sodium chloride 0.9 % 250 mL IVPB (has no administration in time range)        Consult Orders  (From admission, onward)  Start     Ordered   06/30/18 1850  Consult to hospitalist  Once    Provider:  Toy Baker, MD  Question Answer Comment  Place call to: [facility]   Reason for Consult Admit      06/30/18 1850           Significant initial  Findings: Abnormal Labs Reviewed  CBC WITH DIFFERENTIAL/PLATELET - Abnormal; Notable for the following components:      Result Value   Hemoglobin 11.7 (*)    All other components within normal limits  COMPREHENSIVE METABOLIC PANEL - Abnormal; Notable for the following components:   Glucose, Bld 135 (*)    BUN 27 (*)    Creatinine, Ser  1.22 (*)    Calcium 8.0 (*)    Albumin 3.3 (*)    GFR calc non Af Amer 46 (*)    GFR calc Af Amer 53 (*)    All other components within normal limits  D-DIMER, QUANTITATIVE (NOT AT [facility]) - Abnormal; Notable for the following components:   D-Dimer, Quant 1.15 (*)    All other components within normal limits  LACTATE DEHYDROGENASE - Abnormal; Notable for the following components:   LDH 267 (*)    All other components within normal limits  TRIGLYCERIDES - Abnormal; Notable for the following components:   Triglycerides 199 (*)    All other components within normal limits  FIBRINOGEN - Abnormal; Notable for the following components:   Fibrinogen 582 (*)    All other components within normal limits  C-REACTIVE PROTEIN - Abnormal; Notable for the following components:   CRP 5.2 (*)    All other components within normal limits    Otherwise labs showing:    Recent Labs  Lab 06/30/18 1746  NA 135  K 3.7  CO2 23  GLUCOSE 135*  BUN 27*  CREATININE 1.22*  CALCIUM 8.0*    Cr    Up from baseline see below Lab Results  Component Value Date   CREATININE 1.22 (H) 06/30/2018   CREATININE 0.81 02/01/2018   CREATININE 0.81 10/06/2017    Recent Labs  Lab 06/30/18 1746  AST 33  ALT 31  ALKPHOS 38  BILITOT 0.4  PROT 7.1  ALBUMIN 3.3*       WBC       Component Value Date/Time   WBC 4.1 06/30/2018 1746  ANC 2.9 ALC 0.9   Lactic Acid, Venous    Component Value Date/Time   LATICACIDVEN 1.6 06/30/2018 1727    Procalcitonin WNL      Component Value Date/Time   LDH 267 (H) 06/30/2018 1746     ABG    Component Value Date/Time   TCO2 28 07/17/2017 1111    HG/HCT  Up from baseline see below    Component Value Date/Time   HGB 11.7 (L) 06/30/2018 1746   HGB 13.1 04/29/2017 0935   HGB 11.2 (L) 07/11/2014 1340   HCT 36.0 06/30/2018 1746   HCT 39.4 04/29/2017 0935     UA  ordered      CXR - multifocal PNA   ECG:  Personally reviewed by me showing: HR :  70 Rhythm:  NSR, , no evidence of ischemic changes QTC 432     ED Triage Vitals [06/30/18 1715]  Enc Vitals Group     BP (!) 174/70     Pulse Rate 80     Resp (!) 28     Temp (!) 100.9 F (38.3 C)  Temp Source Oral     SpO2 (!) 77 %     Weight 242 lb 8.1 oz (110 kg)     Height      Head Circumference      Peak Flow      Pain Score 0     Pain Loc      Pain Edu?      Excl. in [place]?   ZOXW(96)@       Latest  Blood pressure (!) 143/54, pulse 69, temperature (!) 100.9 F (38.3 C), temperature source Oral, resp. rate (!) 27, weight 110 kg, last menstrual period 11/25/2002, SpO2 95 %.     Hospitalist was called for admission for acute respiratory failure with hypoxia secondary to COVID-19   Review of Systems:    Pertinent positives include: Fevers, chills, fatigue,  shortness of breath at rest.  dyspnea on exertion, nausea, diarrhea, Constitutional:  No weight loss, night sweats, weight loss  HEENT:  No headaches, Difficulty swallowing,Tooth/dental problems,Sore throat,  No sneezing, itching, ear ache, nasal congestion, post nasal drip,  Cardio-vascular:  No chest pain, Orthopnea, PND, anasarca, dizziness, palpitations.no Bilateral lower extremity swelling  GI:  No heartburn, indigestion, abdominal pain, vomiting,  change in bowel habits, loss of appetite, melena, blood in stool, hematemesis Resp:  no  No excess mucus, no productive cough, No non-productive cough, No coughing up of blood.No change in color of mucus.No wheezing. Skin:  no rash or lesions. No jaundice GU:  no dysuria, change in color of urine, no urgency or frequency. No straining to urinate.  No flank pain.  Musculoskeletal:  No joint pain or no joint swelling. No decreased range of motion. No back pain.  Psych:  No change in mood or affect. No depression or anxiety. No memory loss.  Neuro: no localizing neurological complaints, no tingling, no weakness, no double vision, no gait abnormality, no  slurred speech, no confusion  All systems reviewed and apart from [place] all are negative  Past Medical History:   Past Medical History:  Diagnosis Date   Carpal tunnel syndrome of right wrist    Coronary artery disease    per cath 11-29-2013  mild diffuse CAD and calcification in proximal LAD   Depression    Diverticulosis    GERD (gastroesophageal reflux disease)    History of endometrial cancer 05/2003   FIGO, Grade 1  s/p  TAG w/ BSO   HTN (hypertension)    Hyperlipidemia    OA (osteoarthritis)    OSA on CPAP    followed by dr Elsworth Soho-- per study 03-26-2012  mod. to severe OSA , AHI 37/hr   Type II diabetes mellitus ([place])    followed by pcp   Urinary incontinence in female    Vitamin D deficiency       Past Surgical History:  Procedure Laterality Date   CARDIAC CATHETERIZATION  11/29/2013   dr Einar Gip   mild diffuse noncritial CAD and mild calcification involving the proximal LAD,  LVEF 55-60%   CARPAL TUNNEL RELEASE Right 07/17/2017   Procedure: CARPAL TUNNEL RELEASE;  Surgeon: Dorna Leitz, MD;  Location: [place];  Service: Orthopedics;  Laterality: Right;   KNEE ARTHROSCOPY Left 10/ 2004;  05-17-2008   dr graves   KNEE ARTHROSCOPY Right 11/2012   LAPAROSCOPIC CHOLECYSTECTOMY  11-19-2006  dr Zella Richer  [facility]   LEFT HEART CATHETERIZATION WITH CORONARY ANGIOGRAM N/A 11/29/2013   Procedure: LEFT HEART CATHETERIZATION WITH CORONARY ANGIOGRAM;  Surgeon: Laverda Page, MD;  Location: [place] CATH [facility];  Service: Cardiovascular;  Laterality: N/A;   TOTAL ABDOMINAL HYSTERECTOMY W/ BILATERAL SALPINGOOPHORECTOMY  06-20-2003    dr Mohammed Kindle  [facility]   endometrial cancer   TOTAL HIP ARTHROPLASTY Right 09/02/2013   Procedure: RIGHT TOTAL HIP ARTHROPLASTY ANTERIOR APPROACH;  Surgeon: Alta Corning, MD;  Location: [place];  Service: Orthopedics;  Laterality: Right;   TOTAL KNEE ARTHROPLASTY Left 08/02/2012   Procedure: TOTAL KNEE ARTHROPLASTY;  Surgeon: Alta Corning, MD;  Location: [place];  Service: Orthopedics;  Laterality: Left;  left total knee arthroplasty   TUBAL LIGATION  yrs ago    Social History:  Ambulatory   independently       reports that she has never smoked. She has never used smokeless tobacco. She reports that she does not drink alcohol or use drugs.     Family History:   Family History  Problem Relation Age of Onset   Heart disease Father 89       CABG   Lung cancer Father 51       lung   Hypertension Father    Diabetes Father    High Cholesterol Father    Diabetes type II Father    Heart disease Sister 31       CABG   Diabetes Sister 10   Heart disease Brother 43       MI with stents   Hypertension Mother    Diabetes Mother    High Cholesterol Mother    Sudden death Mother    Thyroid disease Mother    Obesity Mother    Hypertension Sister        x2    Diabetes Sister    Hypertension Brother    Diabetes Brother    Cancer Sister        high grade Neuroendocrine carcinoma right axilla   Pancreatic cancer Other        x 3 - 2 MAunts and 1 P Aunts.    Lupus Other        Pat Aunt    Allergies: Allergies  Allergen Reactions   Codeine Palpitations     Prior to Admission medications   Medication Sig Start Date End Date Taking? Authorizing Provider  ACCU-CHEK AVIVA PLUS test strip U UTD 06/12/17   [provider]  ACCU-CHEK SOFTCLIX LANCETS lancets U UTD 06/12/17   [provider]  amLODipine (NORVASC) 10 MG tablet Take 1 tablet (10 mg total) by mouth daily. Patient taking differently: Take 10 mg by mouth every morning.  11/30/13   Adrian Prows, MD  atorvastatin (LIPITOR) 80 MG tablet Take 1 tablet by mouth every evening.  04/11/15   [provider]  benazepril (LOTENSIN) 40 MG tablet Take 40 mg by mouth every evening.     [provider]  bisoprolol (ZEBETA) 10 MG tablet TAKE 1 TABLET BY MOUTH DAILY 04/07/18   Miquel Dunn, NP  cetirizine  (ZYRTEC) 10 MG tablet Take 10 mg by mouth at bedtime.     [provider]  chlorthalidone (HYGROTON) 25 MG tablet Take 25 mg by mouth every morning. 1 table once daily 12/30/13   [provider]  escitalopram (LEXAPRO) 20 MG tablet Take 20 mg by mouth every evening.     [provider]  linagliptin (TRADJENTA) 5 MG TABS tablet Take 1 tablet (5 mg total) by mouth every morning. 02/01/18   Abby Potash, PA-C  meloxicam (MOBIC) 15 MG tablet Take 15  mg by mouth every morning.     [provider]  metFORMIN (GLUCOPHAGE) 1000 MG tablet Take 1 tablet (1,000 mg total) by mouth 2 (two) times daily with a meal. 12/01/17   Abby Potash, PA-C  modafinil (PROVIGIL) 100 MG tablet Take 1 tablet (100 mg total) by mouth daily. 12/17/17   Rigoberto Noel, MD  Multiple Vitamin (MULTIVITAMIN) tablet Take 1 tablet by mouth daily.    [provider]  omeprazole (PRILOSEC) 20 MG capsule Take 20 mg by mouth every morning.     [provider]  ondansetron (ZOFRAN) 4 MG tablet Take 1 tablet (4 mg total) by mouth every 8 (eight) hours as needed for nausea or vomiting. 05/24/17   Margarita Mail, PA-C  Vitamin D, Ergocalciferol, (DRISDOL) 1.25 MG (50000 UT) CAPS capsule Take 1 capsule (50,000 Units total) by mouth every 3 (three) days. 04/29/18   Abby Potash, PA-C   Physical Exam: Blood pressure (!) 143/54, pulse 69, temperature (!) 100.9 F (38.3 C), temperature source Oral, resp. rate (!) 27, weight 110 kg, last menstrual period 11/25/2002, SpO2 95 %. 1. General:  in No  Acute distress increased work of breathing     Chronically ill -appearing 2. Psychological: Alert and  Oriented 3. Head/ENT:     Dry Mucous Membranes                          Head Non traumatic, neck supple                           Poor Dentition                           Conjunctiva erythematous 4. SKIN:   decreased Skin turgor,  Skin clean Dry and intact no rash 5. Heart: Regular rate and  rhythm no Murmur, no Rub or gallop 6. Lungs  no wheezes some  crackles   7. Abdomen: Soft,  non-tender, Non distended  obese  bowel sounds present 8. Lower extremities: no clubbing, cyanosis, no  edema 9. Neurologically Grossly intact, moving all 4 extremities equally  10. MSK: Normal range of motion   All other LABS:     Recent Labs  Lab 06/30/18 1746  WBC 4.1  NEUTROABS 2.9  HGB 11.7*  HCT 36.0  MCV 91.4  PLT 159     Recent Labs  Lab 06/30/18 1746  NA 135  K 3.7  CL 100  CO2 23  GLUCOSE 135*  BUN 27*  CREATININE 1.22*  CALCIUM 8.0*     Recent Labs  Lab 06/30/18 1746  AST 33  ALT 31  ALKPHOS 38  BILITOT 0.4  PROT 7.1  ALBUMIN 3.3*       Cultures: No results found for: SDES, SPECREQUEST, CULT, REPTSTATUS   Radiological Exams on Admission: Dg Chest Port 1 View  Result Date: 06/30/2018 CLINICAL DATA:  Shortness of breath EXAM: PORTABLE CHEST 1 VIEW COMPARISON:  11/28/2013 FINDINGS: The cardiac silhouette is enlarged. There is diffuse bilateral airspace opacities. No pneumothorax. Trace bilateral pleural effusions are suspected. There is no definite acute osseous abnormality. IMPRESSION: 1. Diffuse bilateral pulmonary airspace opacities concerning for multifocal pneumonia (viral or bacterial). Pulmonary edema can have a similar appearance. 2.  Mild cardiomegaly. Electronically Signed   By: Constance Holster M.D.   On: 06/30/2018 18:44    Chart has been reviewed  Assessment/Plan  68 y.o. female with medical history significant of recent diagnosis of COVID19  DM2 and obesity, HTN, HLD, GERD, OSA on CPAP, CAD     Admitted for acute respiratory failure with hypoxia secondary to COVID-19  Present on Admission:  Acute respiratory disease due to COVID-19 virus -   Novel Corona Virus  Positive reportedly from prior studies not available in epic     -  With known exposure to sick contacts  - new severe hypoxia  Following concerning LAB/ imaging findings:     BMP: increased BUN/Cr     CRP, LDH: increased     Procalcitonin: low   CXR: hazy bilateral peripheral opacities        -Following work-up initiated:        sputum cultures   Ordered 06/30/18, Blood cultures  Ordered 06/30/18,      Following complications noted:     evidence of AKI - will provide gentle rehydration  will be monitoring for evidence of other complications such as PE/DVT CVA or  cardiovascular events  Plan of treatment: - Transfer to [facility] facility if positive and beds are available    - Will follow daily d.dimer - Assess for ability to prone  - Supportive management -Fluid sparing resuscitation  -Provide oxygen as needed currently on 4L     -Initiate antibiotics  given evidence worrisome for superinfection  - Consult PCCM if becomes respiratory unstable   Poor Prognostic factors  68 y.o.  Personal hx of  DM2, CAD,  Evidence of  organ damage  Present  elevated  AKI, respiratory failure requiring >4L [place]  Tachypnea on admission    Risk for transmission of COVID 19    HIGH   Given Presence   evidence of severe cough,  oxygen requirement above 4 L,   high risk of aerosolization  Airborne precautions ordered     Will order Airborne and Contact precautions  Family discussion:   At this point they  patient would to be full code       Obstructive sleep apnea - hold of on CPAP for now, reduce risk of aerosolization  Mixed hyperlipidemia -stable continue home medication  Morbid obesity with alveolar hypoventilation (HCC) -chronic unfortunately could be negatively affecting patient at this time.  May worsen prognosis we will continue to monitor  Essential hypertension -chronic stable continue home medications DM 2 -  - Order Sensitive  SSI   -  check TSH and HgA1C  - Hold by mouth medications      Other plan as per orders.  DVT prophylaxis:    Lovenox     Code Status:  FULL CODE  as per patient      Family Communication:   Family not  at  Bedside    Disposition Plan:      To home once workup is complete and patient is stable                      Consults called: none  Admission status:  ED Disposition    None         inpatient     Expect 2 midnight stay secondary to severity of patient's current illness including   hemodynamic instability despite optimal treatment (tachycardia   Tachypnea hypoxia,  )   Severe lab/radiological/exam abnormalities including:  PNA   and extensive comorbidities including: DM2     CAD   That are currently affecting medical management.  I expect  patient to be hospitalized for 2 midnights requiring inpatient medical care.  Patient is at high risk for adverse outcome (such as loss of life or disability) if not treated.  Indication for inpatient stay as follows:    Hemodynamic instability despite maximal medical therapy,    inability to maintain oral hydration , keep n.p.o. for tonight in case patient decompensates and may need intubation     New or worsening hypoxia  Need for IV antibiotics, IV fluids     Level of care     SDU tele indefinitely please discontinue once patient no longer qualifies, [place]   Precautions:  airborne Pharmacologist precautions  PPE: Used by the provider:   P100  eye Goggles,  Gloves  gown   Harlyn Italiano 06/30/2018, 9:32 PM    [facility]     after 2 AM please page floor coverage PA If 7AM-7PM, please contact the day team taking care of the patient using [email or web address]

## 2018-06-30 NOTE — ED Notes (Signed)
Bed: GW37 Expected date:  Expected time:  Means of arrival:  Comments: Negative pressure room

## 2018-06-30 NOTE — ED Notes (Signed)
Admitting MD at bedside.

## 2018-06-30 NOTE — ED Notes (Signed)
[facility] called and paperwork printed

## 2018-07-01 ENCOUNTER — Other Ambulatory Visit: Payer: Self-pay

## 2018-07-01 ENCOUNTER — Inpatient Hospital Stay (HOSPITAL_COMMUNITY): Payer: Medicare Other

## 2018-07-01 DIAGNOSIS — E119 Type 2 diabetes mellitus without complications: Secondary | ICD-10-CM

## 2018-07-01 DIAGNOSIS — U071 COVID-19: Principal | ICD-10-CM

## 2018-07-01 DIAGNOSIS — J80 Acute respiratory distress syndrome: Secondary | ICD-10-CM

## 2018-07-01 DIAGNOSIS — E662 Morbid (severe) obesity with alveolar hypoventilation: Secondary | ICD-10-CM

## 2018-07-01 LAB — COMPREHENSIVE METABOLIC PANEL
ALT: 29 U/L (ref 0–44)
AST: 32 U/L (ref 15–41)
Albumin: 2.9 g/dL — ABNORMAL LOW (ref 3.5–5.0)
Alkaline Phosphatase: 35 U/L — ABNORMAL LOW (ref 38–126)
Anion gap: 10 (ref 5–15)
BUN: 25 mg/dL — ABNORMAL HIGH (ref 8–23)
CO2: 24 mmol/L (ref 22–32)
Calcium: 7.8 mg/dL — ABNORMAL LOW (ref 8.9–10.3)
Chloride: 101 mmol/L (ref 98–111)
Creatinine, Ser: 1.02 mg/dL — ABNORMAL HIGH (ref 0.44–1.00)
GFR calc Af Amer: >60 mL/min (ref >60)
GFR calc non Af Amer: 57 mL/min — ABNORMAL LOW (ref >60)
Glucose, Bld: 154 mg/dL — ABNORMAL HIGH (ref 70–99)
Potassium: 3.8 mmol/L (ref 3.5–5.1)
Sodium: 135 mmol/L (ref 135–145)
Total Bilirubin: 0.6 mg/dL (ref 0.3–1.2)
Total Protein: 6.3 g/dL — ABNORMAL LOW (ref 6.5–8.1)

## 2018-07-01 LAB — CK: Total CK: 147 U/L (ref 38–234)

## 2018-07-01 LAB — GLUCOSE, CAPILLARY
Glucose-Capillary: 122 mg/dL — ABNORMAL HIGH (ref 70–99)
Glucose-Capillary: 147 mg/dL — ABNORMAL HIGH (ref 70–99)
Glucose-Capillary: 197 mg/dL — ABNORMAL HIGH (ref 70–99)
Glucose-Capillary: 199 mg/dL — ABNORMAL HIGH (ref 70–99)
Glucose-Capillary: 230 mg/dL — ABNORMAL HIGH (ref 70–99)
Glucose-Capillary: 231 mg/dL — ABNORMAL HIGH (ref 70–99)
Glucose-Capillary: 270 mg/dL — ABNORMAL HIGH (ref 70–99)
Glucose-Capillary: 297 mg/dL — ABNORMAL HIGH (ref 70–99)

## 2018-07-01 LAB — CBC WITH DIFFERENTIAL/PLATELET
Abs Immature Granulocytes: 0.04 10*3/uL (ref 0.00–0.07)
Basophils Absolute: 0 10*3/uL (ref 0.0–0.1)
Basophils Relative: 0 %
Eosinophils Absolute: 0 10*3/uL (ref 0.0–0.5)
Eosinophils Relative: 0 %
HCT: 34.6 % — ABNORMAL LOW (ref 36.0–46.0)
Hemoglobin: 11.1 g/dL — ABNORMAL LOW (ref 12.0–15.0)
Immature Granulocytes: 1 %
Lymphocytes Relative: 18 %
Lymphs Abs: 0.8 10*3/uL (ref 0.7–4.0)
MCH: 29.4 pg (ref 26.0–34.0)
MCHC: 32.1 g/dL (ref 30.0–36.0)
MCV: 91.8 fL (ref 80.0–100.0)
Monocytes Absolute: 0.1 10*3/uL (ref 0.1–1.0)
Monocytes Relative: 3 %
Neutro Abs: 3.4 10*3/uL (ref 1.7–7.7)
Neutrophils Relative %: 78 %
Platelets: 155 10*3/uL (ref 150–400)
RBC: 3.77 MIL/uL — ABNORMAL LOW (ref 3.87–5.11)
RDW: 13.5 % (ref 11.5–15.5)
WBC: 4.3 10*3/uL (ref 4.0–10.5)
nRBC: 0 % (ref 0.0–0.2)

## 2018-07-01 LAB — POCT I-STAT 7, (LYTES, BLD GAS, ICA,H+H)
Acid-base deficit: 1 mmol/L (ref 0.0–2.0)
Bicarbonate: 23.6 mmol/L (ref 20.0–28.0)
Calcium, Ion: 1.1 mmol/L — ABNORMAL LOW (ref 1.15–1.40)
HCT: 34 % — ABNORMAL LOW (ref 36.0–46.0)
Hemoglobin: 11.6 g/dL — ABNORMAL LOW (ref 12.0–15.0)
O2 Saturation: 89 %
Patient temperature: 102.6
Potassium: 3.6 mmol/L (ref 3.5–5.1)
Sodium: 137 mmol/L (ref 135–145)
TCO2: 25 mmol/L (ref 22–32)
pCO2 arterial: 39.9 mmHg (ref 32.0–48.0)
pH, Arterial: 7.388 (ref 7.350–7.450)
pO2, Arterial: 64 mmHg — ABNORMAL LOW (ref 83.0–108.0)

## 2018-07-01 LAB — TROPONIN I: Troponin I: <0.03 ng/mL (ref <0.03)

## 2018-07-01 LAB — HIV ANTIBODY (ROUTINE TESTING W REFLEX): HIV Screen 4th Generation wRfx: NONREACTIVE

## 2018-07-01 LAB — C-REACTIVE PROTEIN: CRP: 6.1 mg/dL — ABNORMAL HIGH (ref <1.0)

## 2018-07-01 LAB — ABO/RH: ABO/RH(D): A POS

## 2018-07-01 LAB — MAGNESIUM: Magnesium: 1.7 mg/dL (ref 1.7–2.4)

## 2018-07-01 MED ORDER — TOCILIZUMAB 400 MG/20ML IV SOLN
800.0000 mg | Freq: Once | INTRAVENOUS | Status: AC
  Administered 2018-07-01: 800 mg via INTRAVENOUS
  Filled 2018-07-01: qty 40

## 2018-07-01 MED ORDER — ENOXAPARIN SODIUM 60 MG/0.6ML ~~LOC~~ SOLN
55.0000 mg | Freq: Two times a day (BID) | SUBCUTANEOUS | Status: DC
  Administered 2018-07-01 – 2018-07-13 (×25): 55 mg via SUBCUTANEOUS
  Filled 2018-07-01: qty 0.55
  Filled 2018-07-01: qty 0.6
  Filled 2018-07-01 (×2): qty 0.55
  Filled 2018-07-01 (×2): qty 0.6
  Filled 2018-07-01 (×2): qty 0.55
  Filled 2018-07-01 (×6): qty 0.6
  Filled 2018-07-01 (×2): qty 0.55
  Filled 2018-07-01: qty 0.6
  Filled 2018-07-01: qty 0.55
  Filled 2018-07-01 (×2): qty 0.6
  Filled 2018-07-01: qty 0.55
  Filled 2018-07-01 (×2): qty 0.6
  Filled 2018-07-01: qty 0.55
  Filled 2018-07-01 (×2): qty 0.6
  Filled 2018-07-01: qty 0.55

## 2018-07-01 MED ORDER — ENOXAPARIN SODIUM 120 MG/0.8ML ~~LOC~~ SOLN
110.0000 mg | Freq: Two times a day (BID) | SUBCUTANEOUS | Status: DC
  Filled 2018-07-01: qty 0.73

## 2018-07-01 MED ORDER — ROCURONIUM BROMIDE 50 MG/5ML IV SOSY
PREFILLED_SYRINGE | INTRAVENOUS | Status: AC
  Filled 2018-07-01: qty 10

## 2018-07-01 MED ORDER — FENTANYL CITRATE (PF) 100 MCG/2ML IJ SOLN
INTRAMUSCULAR | Status: AC
  Filled 2018-07-01: qty 4

## 2018-07-01 MED ORDER — SODIUM CHLORIDE 0.9% FLUSH
3.0000 mL | Freq: Two times a day (BID) | INTRAVENOUS | Status: DC
  Administered 2018-07-01 (×2): 3 mL via INTRAVENOUS
  Administered 2018-07-02: 8 mL via INTRAVENOUS
  Administered 2018-07-03: 3 mL via INTRAVENOUS
  Administered 2018-07-03: 8 mL via INTRAVENOUS
  Administered 2018-07-04: 21:00:00 10 mL via INTRAVENOUS
  Administered 2018-07-05 – 2018-07-13 (×16): 3 mL via INTRAVENOUS

## 2018-07-01 MED ORDER — ONDANSETRON HCL 4 MG/2ML IJ SOLN
4.0000 mg | Freq: Four times a day (QID) | INTRAMUSCULAR | Status: DC | PRN
  Administered 2018-07-02 – 2018-07-04 (×3): 4 mg via INTRAVENOUS
  Filled 2018-07-01 (×3): qty 2

## 2018-07-01 MED ORDER — ATORVASTATIN CALCIUM 40 MG PO TABS
80.0000 mg | ORAL_TABLET | Freq: Every evening | ORAL | Status: DC
  Administered 2018-07-01 – 2018-07-12 (×12): 80 mg via ORAL
  Filled 2018-07-01: qty 2
  Filled 2018-07-01: qty 8
  Filled 2018-07-01 (×10): qty 2

## 2018-07-01 MED ORDER — SODIUM CHLORIDE 0.9 % IV SOLN
500.0000 mg | INTRAVENOUS | Status: DC
  Administered 2018-07-01 – 2018-07-06 (×6): 500 mg via INTRAVENOUS
  Filled 2018-07-01 (×7): qty 500

## 2018-07-01 MED ORDER — BISOPROLOL FUMARATE 5 MG PO TABS
10.0000 mg | ORAL_TABLET | Freq: Every day | ORAL | Status: DC
  Administered 2018-07-02 – 2018-07-04 (×2): 10 mg via ORAL
  Filled 2018-07-01: qty 1
  Filled 2018-07-01: qty 2
  Filled 2018-07-01: qty 1
  Filled 2018-07-01: qty 2
  Filled 2018-07-01: qty 1

## 2018-07-01 MED ORDER — METHYLPREDNISOLONE SODIUM SUCC 125 MG IJ SOLR
60.0000 mg | Freq: Three times a day (TID) | INTRAMUSCULAR | Status: DC
  Administered 2018-07-01 – 2018-07-04 (×10): 60 mg via INTRAVENOUS
  Filled 2018-07-01 (×10): qty 2

## 2018-07-01 MED ORDER — VITAMIN C 500 MG PO TABS
500.0000 mg | ORAL_TABLET | Freq: Every day | ORAL | Status: DC
  Administered 2018-07-01 – 2018-07-13 (×13): 500 mg via ORAL
  Filled 2018-07-01 (×13): qty 1

## 2018-07-01 MED ORDER — VECURONIUM BROMIDE 10 MG IV SOLR
INTRAVENOUS | Status: AC
  Filled 2018-07-01: qty 10

## 2018-07-01 MED ORDER — ESCITALOPRAM OXALATE 20 MG PO TABS
20.0000 mg | ORAL_TABLET | Freq: Every evening | ORAL | Status: DC
  Administered 2018-07-01 – 2018-07-12 (×12): 20 mg via ORAL
  Filled 2018-07-01 (×13): qty 1

## 2018-07-01 MED ORDER — HYDROCODONE-ACETAMINOPHEN 5-325 MG PO TABS
1.0000 | ORAL_TABLET | ORAL | Status: DC | PRN
  Administered 2018-07-01: 2 via ORAL
  Filled 2018-07-01 (×2): qty 2

## 2018-07-01 MED ORDER — SODIUM CHLORIDE 0.9% IV SOLUTION
Freq: Once | INTRAVENOUS | Status: AC
  Administered 2018-07-01: 13:00:00 via INTRAVENOUS

## 2018-07-01 MED ORDER — SODIUM CHLORIDE 0.9 % IV SOLN
250.0000 mL | INTRAVENOUS | Status: DC | PRN
  Administered 2018-07-03: 22:00:00 35 mL via INTRAVENOUS
  Administered 2018-07-06: 20:00:00 250 mL via INTRAVENOUS

## 2018-07-01 MED ORDER — ONDANSETRON HCL 4 MG PO TABS: 4.0000 mg | ORAL_TABLET | Freq: Four times a day (QID) | ORAL | Status: DC | PRN

## 2018-07-01 MED ORDER — ZINC SULFATE 220 (50 ZN) MG PO CAPS
220.0000 mg | ORAL_CAPSULE | Freq: Every day | ORAL | Status: DC
  Administered 2018-07-01 – 2018-07-13 (×13): 220 mg via ORAL
  Filled 2018-07-01 (×13): qty 1

## 2018-07-01 MED ORDER — ENOXAPARIN SODIUM 40 MG/0.4ML ~~LOC~~ SOLN: 40.0000 mg | SUBCUTANEOUS | Status: DC

## 2018-07-01 MED ORDER — ETOMIDATE 2 MG/ML IV SOLN
INTRAVENOUS | Status: AC
  Filled 2018-07-01: qty 20

## 2018-07-01 MED ORDER — STERILE WATER FOR INJECTION IJ SOLN
INTRAMUSCULAR | Status: AC
  Filled 2018-07-01: qty 10

## 2018-07-01 MED ORDER — SODIUM CHLORIDE 0.9 % IV SOLN: 1.0000 g | Freq: Once | INTRAVENOUS | Status: DC

## 2018-07-01 MED ORDER — SODIUM CHLORIDE 0.9 % IV SOLN: 500.0000 mg | Freq: Once | INTRAVENOUS | Status: DC

## 2018-07-01 MED ORDER — METHYLPREDNISOLONE SODIUM SUCC 125 MG IJ SOLR: 40.0000 mg | Freq: Three times a day (TID) | INTRAMUSCULAR | Status: DC

## 2018-07-01 MED ORDER — SODIUM CHLORIDE 0.9 % IV SOLN
1.0000 g | INTRAVENOUS | Status: DC
  Administered 2018-07-01 – 2018-07-06 (×6): 1 g via INTRAVENOUS
  Filled 2018-07-01: qty 10
  Filled 2018-07-01: qty 1
  Filled 2018-07-01: qty 10
  Filled 2018-07-01 (×2): qty 1
  Filled 2018-07-01 (×2): qty 10

## 2018-07-01 MED ORDER — ACETAMINOPHEN 325 MG PO TABS
650.0000 mg | ORAL_TABLET | Freq: Four times a day (QID) | ORAL | Status: DC | PRN
  Administered 2018-07-02 – 2018-07-11 (×4): 650 mg via ORAL
  Filled 2018-07-01 (×5): qty 2

## 2018-07-01 MED ORDER — SODIUM CHLORIDE 0.9% FLUSH
3.0000 mL | Freq: Two times a day (BID) | INTRAVENOUS | Status: DC
  Administered 2018-07-01 – 2018-07-02 (×3): 3 mL via INTRAVENOUS
  Administered 2018-07-02: 23:00:00 8 mL via INTRAVENOUS
  Administered 2018-07-03: 3 mL via INTRAVENOUS
  Administered 2018-07-03: 22:00:00 10 mL via INTRAVENOUS
  Administered 2018-07-04: 5 mL via INTRAVENOUS
  Administered 2018-07-04 – 2018-07-11 (×11): 3 mL via INTRAVENOUS

## 2018-07-01 MED ORDER — SODIUM CHLORIDE 0.9% FLUSH: 3.0000 mL | INTRAVENOUS | Status: DC | PRN

## 2018-07-01 MED ORDER — FUROSEMIDE 10 MG/ML IJ SOLN
40.0000 mg | Freq: Once | INTRAMUSCULAR | Status: AC
  Administered 2018-07-01: 08:00:00 40 mg via INTRAVENOUS

## 2018-07-01 MED ORDER — BENAZEPRIL HCL 20 MG PO TABS
40.0000 mg | ORAL_TABLET | Freq: Every evening | ORAL | Status: DC
  Administered 2018-07-01 – 2018-07-04 (×4): 40 mg via ORAL
  Filled 2018-07-01 (×4): qty 2

## 2018-07-01 MED ORDER — MIDAZOLAM HCL 2 MG/2ML IJ SOLN
INTRAMUSCULAR | Status: AC
  Filled 2018-07-01: qty 4

## 2018-07-01 MED ORDER — PANTOPRAZOLE SODIUM 40 MG PO TBEC
40.0000 mg | DELAYED_RELEASE_TABLET | Freq: Every day | ORAL | Status: DC
  Administered 2018-07-01 – 2018-07-13 (×12): 40 mg via ORAL
  Filled 2018-07-01 (×14): qty 1

## 2018-07-01 MED ORDER — AMLODIPINE BESYLATE 5 MG PO TABS
10.0000 mg | ORAL_TABLET | Freq: Every morning | ORAL | Status: DC
  Administered 2018-07-01 – 2018-07-13 (×13): 10 mg via ORAL
  Filled 2018-07-01 (×14): qty 2

## 2018-07-01 NOTE — Progress Notes (Signed)
Patient arrived via [facility].

## 2018-07-01 NOTE — Progress Notes (Signed)
Patient being transported to ICU

## 2018-07-01 NOTE — Progress Notes (Signed)
Audile alarm oxygen sats 70's labored breathing respiration rate 30's patient advised she had just gotten out of bed to use the BSC,, NRB increased from 10 liters to 15 liters. Oxygen sats remain low 80's. Charge nurse notified   Respiratory therapy notified, and at bedside. MD notifed order given to transfer patient to ICU.

## 2018-07-01 NOTE — Consult Note (Signed)
NAME:  [name] [name], MRN:  [id number], DOB:  1950-07-23, LOS: 1 ADMISSION DATE:  06/30/2018, CONSULTATION DATE: Jul 01, 2018 REFERRING MD: Dr. Sloan Leiter, CHIEF COMPLAINT: Dyspnea  Brief History   68 year old female with a past medical history significant for diabetes and obstructive sleep apnea was admitted 5/6 with acute respiratory failure with hypoxemia secondary to COVID-19.  Transferred to the ICU on 06/30/2018 for worsening hypoxemia and dyspnea.  History of present illness   This is a pleasant 68 year old female whose past medical history includes diabetes, diastolic heart failure, obstructive sleep apnea who was admitted for worsening shortness of breath and hypoxemia on 06/30/2018.  She states that she has had symptoms of cough achiness and shortness of breath for about 1 week prior to admission.  She had tested positive for COVID.  She presented to the emergency room with worsening shortness of breath, found to have some bilateral infiltrates and hypoxemia.  Admitted to [facility] progressive care unit.  Overnight after admission her dyspnea worsens, hypoxemia worsens.  This all increased acutely when she got up to go to the bathroom.  She was transferred to the intensive care unit for worsening hypoxemia.  In the ICU she says that her breathing seems to be a little better than it was earlier today.  However, she does feel that her dyspnea has steadily worsened over the last week.  She says that she had a bad coughing spell this morning as well.  She says that she has never been told she had any lung problems.  She does use CPAP at night for obstructive sleep apnea.  She never smoked cigarettes.  Past Medical History  Obstructive sleep apnea Hypertension Diabetes mellitus Hypertension Hyperlipidemia GERD Depression Coronary artery disease  Significant Hospital Events   Jun 30, 2018 admitted Jul 01, 2018 moved to ICU for worsening hypoxemia and dyspnea  Consults:  Pulmonary  and critical care medicine  Procedures:    Significant Diagnostic Tests:    Micro Data:  SARS-CoV-2 positive prior to admission at outside facility. 5/6 blood culture >   Antimicrobials:  5/7 ceftriaxone >  5/7 azithro >   Interim history/subjective:  As above  Objective   Blood pressure (!) 146/53, pulse 71, temperature (!) 102.6 F (39.2 C), resp. rate (!) 30, weight 110 kg, last menstrual period 11/25/2002, SpO2 (!) 89 %.    FiO2 (%):  [100 %] 100 %   Intake/Output Summary (Last 24 hours) at 07/01/2018 1016 Last data filed at 07/01/2018 0426 Gross per 24 hour  Intake 0 ml  Output -  Net 0 ml   Filed Weights   06/30/18 1715  Weight: 110 kg    Examination:  General:  Restingin bed, no accessory muscle use, speaking in full sentences, mildly dyspneic HENT: NCAT OP clear PULM: Few crackles bases B, normal effort CV: RRR, no mgr GI: BS+, soft, nontender MSK: normal bulk and tone Neuro: awake, alert, MAEW  Jul 01, 2018 chest x-ray images independently reviewed showing patchy bibasilar infiltrates  Resolved Hospital Problem list     Assessment & Plan:  Acute respiratory failure with hypoxemia due to COVID-19: Admit to ICU Monitor closely for intubation needed, at this time we will continue supplemental oxygen and close monitoring Encouraged to lie in the prone position Actemra now Consent for convalescent plasma, enrollment in clinical trial via [facility] Monitor serum inflammatory biomarkers Increased dose of   Obstructive sleep apnea: Hold CPAP for now  Possible bacterial community-acquired pneumonia: Monitor  cultures Continue ceftriaxone and azithromycin for now  Hypertension: Telemetry monitoring Monitor hemodynamics in ICU environment Antihypertensives per home regimen (bisoprolol, benazepril, amlodipine)  DM2 Per TRN  Best practice:  Diet: regular diet Pain/Anxiety/Delirium protocol (if indicated): n/a VAP protocol (if indicated): n/q  DVT prophylaxis: Lovenox 55mg  sub q q12h GI prophylaxis: PPI (home dose) Glucose control: SSI Mobility: bed rest today Code Status: full Family Communication: daughter updated 5/7 Disposition: remain in ICU  Labs   CBC: Recent Labs  Lab 06/30/18 1746 07/01/18 0215 07/01/18 0845  WBC 4.1 4.3  --   NEUTROABS 2.9 3.4  --   HGB 11.7* 11.1* 11.6*  HCT 36.0 34.6* 34.0*  MCV 91.4 91.8  --   PLT 159 155  --     Basic Metabolic Panel: Recent Labs  Lab 06/30/18 1746 07/01/18 0215 07/01/18 0845  NA 135 135 137  K 3.7 3.8 3.6  CL 100 101  --   CO2 23 24  --   GLUCOSE 135* 154*  --   BUN 27* 25*  --   CREATININE 1.22* 1.02*  --   CALCIUM 8.0* 7.8*  --   MG  --  1.7  --    GFR: Estimated Creatinine Clearance: 64.6 mL/min (A) (by C-G formula based on SCr of 1.02 mg/dL (H)). Recent Labs  Lab 06/30/18 1727 06/30/18 1746 06/30/18 1927 07/01/18 0215  PROCALCITON  --  <0.10  --   --   WBC  --  4.1  --  4.3  LATICACIDVEN 1.6  --  1.1  --     Liver Function Tests: Recent Labs  Lab 06/30/18 1746 07/01/18 0215  AST 33 32  ALT 31 29  ALKPHOS 38 35*  BILITOT 0.4 0.6  PROT 7.1 6.3*  ALBUMIN 3.3* 2.9*   No results for input(s): LIPASE, AMYLASE in the last 168 hours. No results for input(s): AMMONIA in the last 168 hours.  ABG    Component Value Date/Time   PHART 7.388 07/01/2018 0845   PCO2ART 39.9 07/01/2018 0845   PO2ART 64.0 (L) 07/01/2018 0845   HCO3 23.6 07/01/2018 0845   TCO2 25 07/01/2018 0845   ACIDBASEDEF 1.0 07/01/2018 0845   O2SAT 89.0 07/01/2018 0845     Coagulation Profile: No results for input(s): INR, PROTIME in the last 168 hours.  Cardiac Enzymes: Recent Labs  Lab 06/30/18 1808 07/01/18 0215  CKTOTAL  --  147  TROPONINI <0.03 <0.03    HbA1C: Hgb A1c MFr Bld  Date/Time Value Ref Range Status  02/01/2018 11:25 AM 6.7 (H) 4.8 - 5.6 % Final    Comment:             Prediabetes: 5.7 - 6.4          Diabetes: >6.4          Glycemic  control for adults with diabetes: <7.0   10/06/2017 11:35 AM 6.7 (H) 4.8 - 5.6 % Final    Comment:             Prediabetes: 5.7 - 6.4          Diabetes: >6.4          Glycemic control for adults with diabetes: <7.0     CBG: Recent Labs  Lab 07/01/18 0058 07/01/18 0846  GLUCAP 199* 147*    Review of Systems:   Gen: Denies fever, chills, weight change, fatigue, night sweats HEENT: Denies blurred vision, double vision, hearing loss, tinnitus, sinus congestion, rhinorrhea, sore throat, neck  stiffness, dysphagia PULM: per HPI CV: Denies chest pain, edema, orthopnea, paroxysmal nocturnal dyspnea, palpitations GI: Denies abdominal pain, nausea, vomiting, diarrhea, hematochezia, melena, constipation, change in bowel habits GU: Denies dysuria, hematuria, polyuria, oliguria, urethral discharge Endocrine: Denies hot or cold intolerance, polyuria, polyphagia or appetite change Derm: Denies rash, dry skin, scaling or peeling skin change Heme: Denies easy bruising, bleeding, bleeding gums Neuro: Denies headache, numbness, weakness, slurred speech, loss of memory or consciousness   Past Medical History  She,  has a past medical history of Carpal tunnel syndrome of right wrist, Coronary artery disease, Depression, Diverticulosis, GERD (gastroesophageal reflux disease), History of endometrial cancer (05/2003), HTN (hypertension), Hyperlipidemia, OA (osteoarthritis), OSA on CPAP, Type II diabetes mellitus ([place]), Urinary incontinence in female, and Vitamin D deficiency.   Surgical History    Past Surgical History:  Procedure Laterality Date  . CARDIAC CATHETERIZATION  11/29/2013   dr Einar Gip   mild diffuse noncritial CAD and mild calcification involving the proximal LAD,  LVEF 55-60%  . CARPAL TUNNEL RELEASE Right 07/17/2017   Procedure: CARPAL TUNNEL RELEASE;  Surgeon: Dorna Leitz, MD;  Location: [facility];  Service: Orthopedics;  Laterality: Right;  . KNEE ARTHROSCOPY Left 10/  2004;  05-17-2008   dr graves  . KNEE ARTHROSCOPY Right 11/2012  . LAPAROSCOPIC CHOLECYSTECTOMY  11-19-2006  dr Zella Richer  [facility]  . LEFT HEART CATHETERIZATION WITH CORONARY ANGIOGRAM N/A 11/29/2013   Procedure: LEFT HEART CATHETERIZATION WITH CORONARY ANGIOGRAM;  Surgeon: Laverda Page, MD;  Location: [facility] C[facility] LAB;  Service: Cardiovascular;  Laterality: N/A;  . TOTAL ABDOMINAL HYSTERECTOMY W/ BILATERAL SALPINGOOPHORECTOMY  06-20-2003    dr Mohammed Kindle  [facility]   endometrial cancer  . TOTAL HIP ARTHROPLASTY Right 09/02/2013   Procedure: RIGHT TOTAL HIP ARTHROPLASTY ANTERIOR APPROACH;  Surgeon: Alta Corning, MD;  Location: [place];  Service: Orthopedics;  Laterality: Right;  . TOTAL KNEE ARTHROPLASTY Left 08/02/2012   Procedure: TOTAL KNEE ARTHROPLASTY;  Surgeon: Alta Corning, MD;  Location: [place];  Service: Orthopedics;  Laterality: Left;  left total knee arthroplasty  . TUBAL LIGATION  yrs ago     Social History   reports that she has never smoked. She has never used smokeless tobacco. She reports that she does not drink alcohol or use drugs.   Family History   Her family history includes Cancer in her sister; Diabetes in her brother, father, mother, and sister; Diabetes (age of onset: 68) in her sister; Diabetes type II in her father; Heart disease (age of onset: 58) in her brother and father; Heart disease (age of onset: 72) in her sister; High Cholesterol in her father and mother; Hypertension in her brother, father, mother, and sister; Lung cancer (age of onset: 17) in her father; Lupus in an other family member; Obesity in her mother; Pancreatic cancer in an other family member; Sudden death in her mother; Thyroid disease in her mother.   Allergies Allergies  Allergen Reactions  . Codeine Palpitations     Home Medications  Prior to Admission medications   Medication Sig Start Date End Date Taking? Authorizing Provider  amLODipine (NORVASC) 10 MG tablet Take 1 tablet (10 mg total) by mouth  daily. Patient taking differently: Take 10 mg by mouth every morning.  11/30/13  Yes Adrian Prows, MD  atorvastatin (LIPITOR) 80 MG tablet Take 1 tablet by mouth every evening.  04/11/15  Yes [provider]  benazepril (LOTENSIN) 40 MG tablet Take 40 mg by mouth every  evening.    Yes [provider]  bisoprolol (ZEBETA) 10 MG tablet TAKE 1 TABLET BY MOUTH DAILY 04/07/18  Yes Miquel Dunn, NP  cetirizine (ZYRTEC) 10 MG tablet Take 10 mg by mouth at bedtime.    Yes [provider]  chlorthalidone (HYGROTON) 25 MG tablet Take 25 mg by mouth every morning. 1 table once daily 12/30/13  Yes [provider]  escitalopram (LEXAPRO) 20 MG tablet Take 20 mg by mouth every evening.    Yes [provider]  glimepiride (AMARYL) 4 MG tablet Take 2 mg by mouth daily with breakfast.  02/14/18  Yes [provider]  linagliptin (TRADJENTA) 5 MG TABS tablet Take 1 tablet (5 mg total) by mouth every morning. 02/01/18  Yes Abby Potash, PA-C  meloxicam (MOBIC) 15 MG tablet Take 15 mg by mouth every morning.    Yes [provider]  metFORMIN (GLUCOPHAGE) 1000 MG tablet Take 1 tablet (1,000 mg total) by mouth 2 (two) times daily with a meal. 12/01/17  Yes Abby Potash, PA-C  Multiple Vitamin (MULTIVITAMIN) tablet Take 1 tablet by mouth daily.   Yes [provider]  omeprazole (PRILOSEC) 20 MG capsule Take 20 mg by mouth every morning.    Yes [provider]  ondansetron (ZOFRAN) 4 MG tablet Take 1 tablet (4 mg total) by mouth every 8 (eight) hours as needed for nausea or vomiting. 05/24/17  Yes Margarita Mail, PA-C  ACCU-CHEK AVIVA PLUS test strip U UTD 06/12/17   [provider]  ACCU-CHEK SOFTCLIX LANCETS lancets U UTD 06/12/17   [provider]  modafinil (PROVIGIL) 100 MG tablet Take 1 tablet (100 mg total) by mouth daily. Patient not taking: Reported on 06/30/2018 12/17/17   Rigoberto Noel, MD  Vitamin D,  Ergocalciferol, (DRISDOL) 1.25 MG (50000 UT) CAPS capsule Take 1 capsule (50,000 Units total) by mouth every 3 (three) days. 04/29/18   Abby Potash, PA-C     Critical care time: 40 minutes       Roselie Awkward, MD [place] [facility] Pager: [phone] Cell: [phone] If no response, call [phone]

## 2018-07-01 NOTE — Research (Signed)
ISimonne Maffucci, MD, consented Subject [name] [name] (female, Date of Birth 01-05-1951, 68 y.o.) and with diagnosis of COVID-19, in the [facility] Expanded Access Program (EAP) Research Protocol for [employer] against COVID-19.  The consent took place under following circumstances.   Subject Capacity assessed by this investigator as:  Absence of emotional and mental capacity to consent (i.e. delirium, coma, ventilator).  Consent took place in the following setting(s):  In-room, face to face   The following were present for the consent process:  Industrial/product designer, identified as Bryson Dames, has signed and dated the consent.  The consent has been added to subject's chart.   A copy of the cover letter and signed consent document was provided to subject/LAR.  The original signed consent document has been placed in the subject's physical chart and will be scanned into the electronic medical record upon discharge.  Statement of acknowledgement that the following was discussed with the subject/LAR:    1) Discussed the purpose of the research and procedures  2) Discussed risks and benefits and uncertainties of study participation 3) Discussed subject's responsibilities  4) Discussed the measures in place to maintain subject's confidentiality while a participant on the trial  5) Discussed alternatives to study participation.   6) Discussed study participation is voluntary and that the subject's care would not be jeopardized if they declined participation in the study.   7) Discussed freedom to withdraw at any time.   8) All subject/LAR questions were answered to their satisfaction.   9) In case of emergency consent, investigator agreed to discuss with subject/LAR at earliest available opportunity when the subject stabilizes and/or LAR can be located.     Final Investigator Signature  Donnamae Jude [place], [place] [phone]   Date: 07/01/2018 and 10:43 AM

## 2018-07-01 NOTE — Progress Notes (Signed)
Yellow gold wedding band given to daughter, [name]

## 2018-07-01 NOTE — Progress Notes (Signed)
PROGRESS NOTE                                                                                                                                                                                                             Patient Demographics:    [name] [name], is a 68 y.o. female, DOB - 07/01/1950, IPJ:[id number]  Outpatient Primary MD for the patient is Vicenta Aly, [place]    LOS - 1  Chief Complaint  Patient presents with  . Shortness of Breath       Brief Narrative  68 year old female with history of HTN, DM, chronic diastolic heart failure, OSA who tested positive for COVID-19 approximately 1 week back, subsequently developed worsening shortness of breath and was brought to the emergency room-where she was found to have acute hypoxemic respiratory failure and transferred to [facility] admission-her hypoxemia worsened-and was subsequently transferred to the ICU on 5/7 for close monitoring.  See below for further details   Subjective:    [name] [name] today developed worsening shortness of breath-her O2 saturations dropped-requiring 100% nonrebreather mask.  With supportive care-her O2 saturations slowly came up from the 70s to low 90s range.  She is tachypneic-and just about able to talk in full sentences.   Assessment  & Plan :   Acute hypoxic respiratory failure secondary to COVID-19 pneumonia: Worsening respiratory status-tachypneic but just about able to talk in full sentences.  Have ordered 1 dose of Lasix to be given stat, subsequently have ordered IV steroids, IV Actemra and have transferred the patient to the ICU.  Remains on empiric Rocephin/Zithromax-we will continue for a few days at this point.  PCCM has been consulted-the plan to add on convalescent plasma.  Have encouraged patient to lie prone-she will be monitored closely in the ICU-if her respiratory status worsens-she may need to be  intubated.  The treatment plan,use of Actemra and its known side effects were discussed with patient/family, they were clearly explained that there is no proven definitive treatment for COVID-19 infection, any medications used here are based on published clinical articles/anecdotal data which were not obtained by randomized controlled trials.  Complete risks and long-term side effects are unknown, however in the current scenario of ongoing patient deterioration,the clinical judgment is that the benefit may outweigh the risks. Patient agree's with the treatment plan and  consent to receive the recommended medications.  Chronic diastolic heart failure: Blood pressure stable-has no signs of volume overload-due to worsening respiratory status-we will give 1 dose of empiric Lasix.  HTN: Cautiously continue with bisoprolol, benazepril and amlodipine.  Follow and adjust accordingly.  DM 2: CBGs relatively stable with SSI-follow as patient will be on steroids.  Morbid obesity  ABG     Component Value Date/Time   PHART 7.388 07/01/2018 0845   PCO2ART 39.9 07/01/2018 0845   PO2ART 64.0 (L) 07/01/2018 0845   HCO3 23.6 07/01/2018 0845   TCO2 25 07/01/2018 0845   ACIDBASEDEF 1.0 07/01/2018 0845   O2SAT 89.0 07/01/2018 0845    COVID-19 Labs  Recent Labs    06/30/18 1746 07/01/18 0215  DDIMER 1.15*  --   FERRITIN 138  --   LDH 267*  --   CRP 5.2* 6.1*    No results found for: SARSCOV2NAA   No results found for: BNP  No results for input(s): PROBNP in the last 8760 hours.     Condition - Extremely Guarded  Family Communication  : None at bedside-PCCM spoke with patient's daughter who is a respiratory therapist.  Code Status : Full code  Disposition Plan  : Transfer to ICU  Consults  : PCCM  Procedures  : None  PUD Prophylaxis :  PPI  DVT Prophylaxis  :  Lovenox BID dosing  Lab Results  Component Value Date   PLT 155 07/01/2018    Inpatient Medications  Scheduled  Meds: . amLODipine  10 mg Oral q morning - 10a  . atorvastatin  80 mg Oral QPM  . benazepril  40 mg Oral QPM  . bisoprolol  10 mg Oral Daily  . enoxaparin (LOVENOX) injection  55 mg Subcutaneous Q12H  . escitalopram  20 mg Oral QPM  . etomidate      . fentaNYL      . insulin aspart  0-9 Units Subcutaneous Q4H  . methylPREDNISolone (SOLU-MEDROL) injection  60 mg Intravenous Q8H  . midazolam      . pantoprazole  40 mg Oral Daily  . rocuronium Bromide      . sodium chloride flush  3 mL Intravenous Q12H  . sodium chloride flush  3 mL Intravenous Q12H  . sterile water (preservative free)      . vecuronium      . vitamin C  500 mg Oral Daily  . zinc sulfate  220 mg Oral Daily   Continuous Infusions: . sodium chloride    . azithromycin (ZITHROMAX) 500 MG IVPB (Vial-Mate [employer]or)    . cefTRIAXone (ROCEPHIN)  IV    . tocilizumab (ACTEMRA) IV     PRN Meds:.sodium chloride, acetaminophen, HYDROcodone-acetaminophen, ondansetron **OR** ondansetron (ZOFRAN) IV, sodium chloride flush  Antibiotics  :    Anti-infectives (From admission, onward)   Start     Dose/Rate Route Frequency Ordered Stop   07/01/18 2000  azithromycin (ZITHROMAX) 500 mg in sodium chloride 0.9 % 250 mL IVPB  Status:  Discontinued     500 mg 250 mL/hr over 60 Minutes Intravenous  Once 07/01/18 0040 07/01/18 0045   07/01/18 2000  cefTRIAXone (ROCEPHIN) 1 g in sodium chloride 0.9 % 100 mL IVPB     1 g 200 mL/hr over 30 Minutes Intravenous Every 24 hours 07/01/18 0044     07/01/18 2000  azithromycin (ZITHROMAX) 500 mg in sodium chloride 0.9 % 250 mL IVPB     500 mg 250 mL/hr over 60 Minutes  Intravenous Every 24 hours 07/01/18 0045     07/01/18 1900  cefTRIAXone (ROCEPHIN) 1 g in sodium chloride 0.9 % 100 mL IVPB  Status:  Discontinued     1 g 200 mL/hr over 30 Minutes Intravenous  Once 07/01/18 0040 07/01/18 0044   06/30/18 1900  cefTRIAXone (ROCEPHIN) 1 g in sodium chloride 0.9 % 100 mL IVPB     1 g 200 mL/hr over 30  Minutes Intravenous  Once 06/30/18 1850 06/30/18 2136   06/30/18 1900  azithromycin (ZITHROMAX) 500 mg in sodium chloride 0.9 % 250 mL IVPB     500 mg 250 mL/hr over 60 Minutes Intravenous  Once 06/30/18 1850 06/30/18 2136       Time Spent in minutes  50   Oren Binet M.D on 07/01/2018 at 10:34 AM   [facility] -  Office  [phone]   Total Critical Care time in examining the patient bedside, evaluating Lab work and other data, over half of the total time was spent in coordinating patient care on the floor or bedisde in talking to patient/family members, communicating with nursing Staff on the floor and sub specialists   to coordinate patients medical care and needs is  50 Minutes.   See all Orders from today for further details  Admit date - 06/30/2018    1    Objective:   Vitals:   07/01/18 0425 07/01/18 0800 07/01/18 0900 07/01/18 0913  BP: (!) 123/51 (!) 149/62  (!) 146/53  Pulse: 62 70  71  Resp: 18 (!) 25  (!) 30  Temp: 98.6 F (37 C)  (!) 102.6 F (39.2 C)   TempSrc: Oral     SpO2: 94% 90%  (!) 89%  Weight:        Wt Readings from Last 3 Encounters:  06/30/18 110 kg  04/29/18 110.2 kg  04/15/18 110.7 kg     Intake/Output Summary (Last 24 hours) at 07/01/2018 1034 Last data filed at 07/01/2018 0426 Gross per 24 hour  Intake 0 ml  Output -  Net 0 ml     Physical Exam  Awake Alert, Oriented X 3, No new F.N deficits, Normal affect.  Tachypneic but just about able to talk in full sentences [place].AT,PERRAL Supple Neck,No JVD, No cervical lymphadenopathy appriciated.  Symmetrical Chest wall movement, Good air movement bilaterally, bibasilar rales RRR,No Gallops,Rubs or new Murmurs, No Parasternal Heave +ve B.Sounds, Abd Soft, No tenderness, No organomegaly appriciated, No rebound - guarding or rigidity. No Cyanosis, Clubbing or edema, No new Rash or bruise     Data Review:    CBC Recent Labs  Lab 06/30/18 1746 07/01/18 0215 07/01/18 0845   WBC 4.1 4.3  --   HGB 11.7* 11.1* 11.6*  HCT 36.0 34.6* 34.0*  PLT 159 155  --   MCV 91.4 91.8  --   MCH 29.7 29.4  --   MCHC 32.5 32.1  --   RDW 13.5 13.5  --   LYMPHSABS 0.9 0.8  --   MONOABS 0.2 0.1  --   EOSABS 0.0 0.0  --   BASOSABS 0.0 0.0  --     Chemistries  Recent Labs  Lab 06/30/18 1746 07/01/18 0215 07/01/18 0845  NA 135 135 137  K 3.7 3.8 3.6  CL 100 101  --   CO2 23 24  --   GLUCOSE 135* 154*  --   BUN 27* 25*  --   CREATININE 1.22* 1.02*  --   CALCIUM  8.0* 7.8*  --   MG  --  1.7  --   AST 33 32  --   ALT 31 29  --   ALKPHOS 38 35*  --   BILITOT 0.4 0.6  --    ------------------------------------------------------------------------------------------------------------------ Recent Labs    06/30/18 1746  TRIG 199*    Lab Results  Component Value Date   HGBA1C 6.7 (H) 02/01/2018   ------------------------------------------------------------------------------------------------------------------ No results for input(s): TSH, T4TOTAL, T3FREE, THYROIDAB in the last 72 hours.  Invalid input(s): FREET3  Cardiac Enzymes Recent Labs  Lab 06/30/18 1808 07/01/18 0215  TROPONINI <0.03 <0.03   ------------------------------------------------------------------------------------------------------------------ No results found for: BNP  Micro Results Recent Results (from the past 240 hour(s))  Blood Culture (routine x 2)     Status: None (Preliminary result)   Collection Time: 06/30/18  5:46 PM  Result Value Ref Range Status   Specimen Description   Final    BLOOD LEFT ANTECUBITAL Performed at [facility] [address], [place], [place] [address]    Special Requests   Final    BOTTLES DRAWN AEROBIC AND ANAEROBIC Blood Culture results may not be optimal due to an excessive volume of blood received in culture bottles Performed at [facility] [address] [place], [place] [address]    Culture   Final    NO  GROWTH < 12 HOURS Performed at [facility] [address], [place], [place] [address]    Report Status PENDING  Incomplete  Blood Culture (routine x 2)     Status: None (Preliminary result)   Collection Time: 06/30/18  5:50 PM  Result Value Ref Range Status   Specimen Description   Final    BLOOD RIGHT ANTECUBITAL Performed at [facility] [address] [place], [place] [address]    Special Requests   Final    BOTTLES DRAWN AEROBIC AND ANAEROBIC Blood Culture adequate volume Performed at [facility] [address], [place], [place] [address]    Culture   Final    NO GROWTH < 12 HOURS Performed at [facility] [address] [place], [place] [address]    Report Status PENDING  Incomplete    Radiology Reports Dg Chest Port 1 View  Result Date: 06/30/2018 CLINICAL DATA:  Shortness of breath EXAM: PORTABLE CHEST 1 VIEW COMPARISON:  11/28/2013 FINDINGS: The cardiac silhouette is enlarged. There is diffuse bilateral airspace opacities. No pneumothorax. Trace bilateral pleural effusions are suspected. There is no definite acute osseous abnormality. IMPRESSION: 1. Diffuse bilateral pulmonary airspace opacities concerning for multifocal pneumonia (viral or bacterial). Pulmonary edema can have a similar appearance. 2.  Mild cardiomegaly. Electronically Signed   By: Constance Holster M.D.   On: 06/30/2018 18:44   Dg Chest Port 1v Same Day  Result Date: 07/01/2018 CLINICAL DATA:  68 year old female with shortness of breath EXAM: PORTABLE CHEST 1 VIEW COMPARISON:  06/30/2018 FINDINGS: Cardiomediastinal silhouette unchanged in size and contour. Patchy airspace and interstitial opacities the bilateral lungs, similar to the comparison. No large pleural effusion or pneumothorax. No displaced fracture. IMPRESSION: Similar appearance of the chest x-ray with mixed interstitial and airspace opacities bilaterally. Electronically Signed   By: Corrie Mckusick D.O.   On: 07/01/2018 09:52

## 2018-07-01 NOTE — Progress Notes (Addendum)
ANTICOAGULATION CONSULT NOTE - Initial Consult  Pharmacy Consult for Enoxaparin Indication: +D-Dimer--MD wants enoxaparin adjusted dosing Covid + and clot risk   Allergies  Allergen Reactions  . Codeine Palpitations    Patient Measurements: Weight: 242 lb 8.1 oz (110 kg) Heparin Dosing Weight:   Vital Signs: Temp: 100.9 F (38.3 C) (05/06 1715) Temp Source: Oral (05/06 1715) BP: 126/55 (05/06 2300) Pulse Rate: 70 (05/06 2300)  Labs: Recent Labs    06/30/18 1746 06/30/18 1808  HGB 11.7*  --   HCT 36.0  --   PLT 159  --   CREATININE 1.22*  --   TROPONINI  --  <0.03    Estimated Creatinine Clearance: 54 mL/min (A) (by C-G formula based on SCr of 1.22 mg/dL (H)).   Medical History: Past Medical History:  Diagnosis Date  . Carpal tunnel syndrome of right wrist   . Coronary artery disease    per cath 11-29-2013  mild diffuse CAD and calcification in proximal LAD  . Depression   . Diverticulosis   . GERD (gastroesophageal reflux disease)   . History of endometrial cancer 05/2003   FIGO, Grade 1  s/p  TAG w/ BSO  . HTN (hypertension)   . Hyperlipidemia   . OA (osteoarthritis)   . OSA on CPAP    followed by dr Elsworth Soho-- per study 03-26-2012  mod. to severe OSA , AHI 37/hr  . Type II diabetes mellitus ([place])    followed by pcp  . Urinary incontinence in female   . Vitamin D deficiency     Medications:  Scheduled:  . enoxaparin (LOVENOX) injection  110 mg Subcutaneous NOW  . enoxaparin (LOVENOX) injection  110 mg Subcutaneous Q12H  . insulin aspart  0-9 Units Subcutaneous Q4H    Assessment: +D-Dimer--MD wants treatement dose due to Covid + and clot risk.  MD aware of CCM dosing recommendations and feels patient at higher risk for VTE, therefore wants the adjusted dosing of 0.5mg /kg sq q12hr for DVT prophylaxis.  No oral anticoagulants noted on med rec.    Goal of Therapy:  Intermediate-dose weight based VTE prophylaxis Monitor platelets by anticoagulation  protocol: Yes   Plan:  Enoxaparin 55 mg sq q12hr CBC q 3days  Nani Skillern [place] 07/01/2018,12:21 AM   Adjusted dosing after discussion with MD

## 2018-07-02 DIAGNOSIS — J069 Acute upper respiratory infection, unspecified: Secondary | ICD-10-CM

## 2018-07-02 LAB — CBC WITH DIFFERENTIAL/PLATELET
Abs Immature Granulocytes: 0.05 10*3/uL (ref 0.00–0.07)
Basophils Absolute: 0 10*3/uL (ref 0.0–0.1)
Basophils Relative: 0 %
Eosinophils Absolute: 0 10*3/uL (ref 0.0–0.5)
Eosinophils Relative: 0 %
HCT: 36.6 % (ref 36.0–46.0)
Hemoglobin: 11.8 g/dL — ABNORMAL LOW (ref 12.0–15.0)
Immature Granulocytes: 1 %
Lymphocytes Relative: 12 %
Lymphs Abs: 0.6 10*3/uL — ABNORMAL LOW (ref 0.7–4.0)
MCH: 29 pg (ref 26.0–34.0)
MCHC: 32.2 g/dL (ref 30.0–36.0)
MCV: 89.9 fL (ref 80.0–100.0)
Monocytes Absolute: 0.1 10*3/uL (ref 0.1–1.0)
Monocytes Relative: 3 %
Neutro Abs: 4.4 10*3/uL (ref 1.7–7.7)
Neutrophils Relative %: 84 %
Platelets: 205 10*3/uL (ref 150–400)
RBC: 4.07 MIL/uL (ref 3.87–5.11)
RDW: 13 % (ref 11.5–15.5)
WBC: 5.2 10*3/uL (ref 4.0–10.5)
nRBC: 0 % (ref 0.0–0.2)

## 2018-07-02 LAB — COMPREHENSIVE METABOLIC PANEL
ALT: 30 U/L (ref 0–44)
AST: 37 U/L (ref 15–41)
Albumin: 3.2 g/dL — ABNORMAL LOW (ref 3.5–5.0)
Alkaline Phosphatase: 42 U/L (ref 38–126)
Anion gap: 13 (ref 5–15)
BUN: 39 mg/dL — ABNORMAL HIGH (ref 8–23)
CO2: 23 mmol/L (ref 22–32)
Calcium: 8.2 mg/dL — ABNORMAL LOW (ref 8.9–10.3)
Chloride: 101 mmol/L (ref 98–111)
Creatinine, Ser: 1.09 mg/dL — ABNORMAL HIGH (ref 0.44–1.00)
GFR calc Af Amer: >60 mL/min (ref >60)
GFR calc non Af Amer: 53 mL/min — ABNORMAL LOW (ref >60)
Glucose, Bld: 253 mg/dL — ABNORMAL HIGH (ref 70–99)
Potassium: 3.5 mmol/L (ref 3.5–5.1)
Sodium: 137 mmol/L (ref 135–145)
Total Bilirubin: 0.5 mg/dL (ref 0.3–1.2)
Total Protein: 7.5 g/dL (ref 6.5–8.1)

## 2018-07-02 LAB — PREPARE FRESH FROZEN PLASMA

## 2018-07-02 LAB — POCT I-STAT 7, (LYTES, BLD GAS, ICA,H+H)
Acid-base deficit: 1 mmol/L (ref 0.0–2.0)
Bicarbonate: 23.4 mmol/L (ref 20.0–28.0)
Calcium, Ion: 1.13 mmol/L — ABNORMAL LOW (ref 1.15–1.40)
HCT: 34 % — ABNORMAL LOW (ref 36.0–46.0)
Hemoglobin: 11.6 g/dL — ABNORMAL LOW (ref 12.0–15.0)
O2 Saturation: 94 %
Patient temperature: 98.6
Potassium: 3.6 mmol/L (ref 3.5–5.1)
Sodium: 137 mmol/L (ref 135–145)
TCO2: 25 mmol/L (ref 22–32)
pCO2 arterial: 38.9 mmHg (ref 32.0–48.0)
pH, Arterial: 7.387 (ref 7.350–7.450)
pO2, Arterial: 71 mmHg — ABNORMAL LOW (ref 83.0–108.0)

## 2018-07-02 LAB — BPAM FFP
Blood Product Expiration Date: 202005081400
ISSUE DATE / TIME: 202005071435
Unit Type and Rh: 6200

## 2018-07-02 LAB — GLUCOSE, CAPILLARY
Glucose-Capillary: 263 mg/dL — ABNORMAL HIGH (ref 70–99)
Glucose-Capillary: 260 mg/dL — ABNORMAL HIGH (ref 70–99)
Glucose-Capillary: 316 mg/dL — ABNORMAL HIGH (ref 70–99)
Glucose-Capillary: 316 mg/dL — ABNORMAL HIGH (ref 70–99)
Glucose-Capillary: 321 mg/dL — ABNORMAL HIGH (ref 70–99)

## 2018-07-02 LAB — CK: Total CK: 192 U/L (ref 38–234)

## 2018-07-02 LAB — C-REACTIVE PROTEIN: CRP: 10.3 mg/dL — ABNORMAL HIGH (ref <1.0)

## 2018-07-02 LAB — INTERLEUKIN-6, PLASMA: Interleukin-6, Plasma: 127.3 pg/mL — ABNORMAL HIGH (ref 0.0–12.2)

## 2018-07-02 LAB — D-DIMER, QUANTITATIVE (NOT AT ARMC): D-Dimer, Quant: 0.83 ug/mL-FEU — ABNORMAL HIGH (ref 0.00–0.50)

## 2018-07-02 LAB — MAGNESIUM: Magnesium: 1.7 mg/dL (ref 1.7–2.4)

## 2018-07-02 MED ORDER — MAGNESIUM OXIDE 400 (241.3 MG) MG PO TABS
400.0000 mg | ORAL_TABLET | Freq: Two times a day (BID) | ORAL | Status: AC
  Administered 2018-07-02 (×2): 400 mg via ORAL
  Filled 2018-07-02 (×2): qty 1

## 2018-07-02 MED ORDER — INSULIN ASPART 100 UNIT/ML ~~LOC~~ SOLN
0.0000 [IU] | Freq: Three times a day (TID) | SUBCUTANEOUS | Status: DC
  Administered 2018-07-02 (×2): 11 [IU] via SUBCUTANEOUS
  Administered 2018-07-03 (×3): 8 [IU] via SUBCUTANEOUS
  Administered 2018-07-04: 17:00:00 5 [IU] via SUBCUTANEOUS
  Administered 2018-07-04: 8 [IU] via SUBCUTANEOUS
  Administered 2018-07-04: 14 [IU] via SUBCUTANEOUS
  Administered 2018-07-05: 18:00:00 8 [IU] via SUBCUTANEOUS
  Administered 2018-07-05 – 2018-07-06 (×5): 5 [IU] via SUBCUTANEOUS
  Administered 2018-07-07 (×2): 8 [IU] via SUBCUTANEOUS
  Administered 2018-07-07: 3 [IU] via SUBCUTANEOUS
  Administered 2018-07-08: 2 [IU] via SUBCUTANEOUS
  Administered 2018-07-08: 3 [IU] via SUBCUTANEOUS
  Administered 2018-07-09: 5 [IU] via SUBCUTANEOUS
  Administered 2018-07-09: 8 [IU] via SUBCUTANEOUS
  Administered 2018-07-10: 3 [IU] via SUBCUTANEOUS
  Administered 2018-07-10: 2 [IU] via SUBCUTANEOUS
  Administered 2018-07-11 (×2): 3 [IU] via SUBCUTANEOUS
  Administered 2018-07-12: 2 [IU] via SUBCUTANEOUS
  Administered 2018-07-12: 5 [IU] via SUBCUTANEOUS
  Administered 2018-07-13: 3 [IU] via SUBCUTANEOUS

## 2018-07-02 MED ORDER — PHENOL 1.4 % MT LIQD
1.0000 | OROMUCOSAL | Status: DC | PRN
  Administered 2018-07-02: 1 via OROMUCOSAL
  Filled 2018-07-02: qty 177

## 2018-07-02 MED ORDER — INSULIN ASPART 100 UNIT/ML ~~LOC~~ SOLN
0.0000 [IU] | Freq: Every day | SUBCUTANEOUS | Status: DC
  Administered 2018-07-02 – 2018-07-03 (×2): 4 [IU] via SUBCUTANEOUS
  Administered 2018-07-04: 2 [IU] via SUBCUTANEOUS
  Administered 2018-07-05 – 2018-07-07 (×3): 3 [IU] via SUBCUTANEOUS
  Administered 2018-07-08 – 2018-07-11 (×4): 2 [IU] via SUBCUTANEOUS

## 2018-07-02 MED ORDER — INSULIN GLARGINE 100 UNIT/ML ~~LOC~~ SOLN
5.0000 [IU] | Freq: Two times a day (BID) | SUBCUTANEOUS | Status: DC
  Administered 2018-07-02: 11:00:00 5 [IU] via SUBCUTANEOUS
  Filled 2018-07-02: qty 0.05

## 2018-07-02 MED ORDER — INSULIN GLARGINE 100 UNIT/ML ~~LOC~~ SOLN
10.0000 [IU] | Freq: Two times a day (BID) | SUBCUTANEOUS | Status: DC
  Administered 2018-07-02: 23:00:00 10 [IU] via SUBCUTANEOUS
  Filled 2018-07-02 (×2): qty 0.1

## 2018-07-02 MED ORDER — FUROSEMIDE 10 MG/ML IJ SOLN
40.0000 mg | Freq: Two times a day (BID) | INTRAMUSCULAR | Status: DC
  Administered 2018-07-02 – 2018-07-06 (×9): 40 mg via INTRAVENOUS
  Filled 2018-07-02 (×9): qty 4

## 2018-07-02 MED ORDER — TOCILIZUMAB 400 MG/20ML IV SOLN
800.0000 mg | Freq: Once | INTRAVENOUS | Status: AC
  Administered 2018-07-02: 800 mg via INTRAVENOUS
  Filled 2018-07-02: qty 40

## 2018-07-02 MED ORDER — POTASSIUM CHLORIDE CRYS ER 20 MEQ PO TBCR
40.0000 meq | EXTENDED_RELEASE_TABLET | Freq: Two times a day (BID) | ORAL | Status: AC
  Administered 2018-07-02 (×2): 40 meq via ORAL
  Filled 2018-07-02 (×2): qty 2

## 2018-07-02 MED ORDER — INSULIN ASPART 100 UNIT/ML ~~LOC~~ SOLN
3.0000 [IU] | Freq: Three times a day (TID) | SUBCUTANEOUS | Status: DC
  Administered 2018-07-02 – 2018-07-07 (×17): 3 [IU] via SUBCUTANEOUS

## 2018-07-02 NOTE — Progress Notes (Signed)
[facility] PROGRESS NOTE    Progress Note  [name]  EQA:[id number] DOB: 1951/01/09 DOA: 06/30/2018 PCP: Vicenta Aly, FNP     Brief Narrative:   [name] [name] is an 68 y.o. female past medical history of essential hypertension, diabetes mellitus type 2, chronic diastolic heart failure obstructive sleep apnea tested positive for COVID-19 approximately 1 week ago into the ED she was found hypoxemic and respiratory failure transferred to [facility], post admission her hypoxemia worsened and subsequently transferred to the ICU on 07/01/2018  Assessment/Plan:   Acute respiratory disease due to COVID-19 virus: She received IV steroids and Actemra on 07/01/2018 and transferred to the ICU. She remains on IV Rocephin and azithro will complete a 7-day course. She is status post 2 doses of Actemra PCCM has been consulted and they plan to do convalescent plasma Will encourage patient to be in the prone position. Out of bed to chair.  SIRS due to COVID: COVID-19 Labs  Recent Labs    06/30/18 1746 07/01/18 0215 07/02/18 0500  DDIMER 1.15*  --  0.83*  FERRITIN 138  --   --   LDH 267*  --   --   CRP 5.2* 6.1* 10.3*    No results found for: SARSCOV2NAA   Chronic diastolic heart failure: No signs of volume overload.  Essential hypertension: Continue bisoprolol, benazepril and amlodipine. Continue IV Lasix intermittently  Diabetes mellitus type 2: She is currently on IV steroids. Blood glucose ranging greater than 200 will start on low-dose long-acting insulin.  Obstructive sleep apnea  Morbid obesity with alveolar hypoventilation ([place])   DVT prophylaxis: lovenox Family Communication:none Disposition Plan/Barrier to D/C: unable to determine Code Status:     Code Status Orders  (From admission, onward)         Start     Ordered   07/01/18 0040  Full code  Continuous     07/01/18 0040        Code Status History    Date Active Date  Inactive Code Status Order ID Comments User Context   06/30/2018 2251 07/01/2018 0040 Full Code [id number]  Toy Baker, MD ED   11/29/2013 1958 11/30/2013 1340 Full Code [id number]  Adrian Prows, MD Inpatient   11/29/2013 1704 11/29/2013 1958 Full Code [id number]  Adrian Prows, MD ED   09/02/2013 1708 09/03/2013 1908 Full Code [id number]  Penelope Coop Inpatient   08/02/2012 1639 08/04/2012 1709 Full Code [id number]  Erlene Senters, PA-C Inpatient        IV Access:    Peripheral IV   Procedures and diagnostic studies:   Dg Chest Port 1 View  Result Date: 06/30/2018 CLINICAL DATA:  Shortness of breath EXAM: PORTABLE CHEST 1 VIEW COMPARISON:  11/28/2013 FINDINGS: The cardiac silhouette is enlarged. There is diffuse bilateral airspace opacities. No pneumothorax. Trace bilateral pleural effusions are suspected. There is no definite acute osseous abnormality. IMPRESSION: 1. Diffuse bilateral pulmonary airspace opacities concerning for multifocal pneumonia (viral or bacterial). Pulmonary edema can have a similar appearance. 2.  Mild cardiomegaly. Electronically Signed   By: Constance Holster M.D.   On: 06/30/2018 18:44   Dg Chest Port 1v Same Day  Result Date: 07/01/2018 CLINICAL DATA:  68 year old female with shortness of breath EXAM: PORTABLE CHEST 1 VIEW COMPARISON:  06/30/2018 FINDINGS: Cardiomediastinal silhouette unchanged in size and contour. Patchy airspace and interstitial opacities the bilateral lungs, similar to the comparison. No large pleural effusion or pneumothorax. No displaced fracture. IMPRESSION: Similar appearance  of the chest x-ray with mixed interstitial and airspace opacities bilaterally. Electronically Signed   By: Corrie Mckusick D.O.   On: 07/01/2018 09:52     Medical Consultants:    None.  Anti-Infectives:   IV Rocephin and azithromycin  Subjective:    [name] [name] relates her breathing is unchanged compared to yesterday.  Objective:    Vitals:    07/02/18 0500 07/02/18 0600 07/02/18 0700 07/02/18 0723  BP: (!) 129/51 (!) 143/49 (!) 134/55 (!) 134/55  Pulse: (!) 56 (!) 55 (!) 56 63  Resp: (!) 21 (!) 21 (!) 26 (!) 23  Temp:      TempSrc:      SpO2: 94% (!) 88% 93% 94%  Weight:      Height:        Intake/Output Summary (Last 24 hours) at 07/02/2018 0819 Last data filed at 07/02/2018 0700 Gross per 24 hour  Intake 1053.39 ml  Output 1000 ml  Net 53.39 ml   Filed Weights   06/30/18 1715 07/01/18 1415  Weight: 110 kg 112.5 kg    Exam: General exam: In no acute distress. Respiratory system: Good air movement and clear to auscultation. Cardiovascular system: S1 & S2 heard, RRR. No JVD, murmurs, rubs, gallops or clicks.  Gastrointestinal system: Abdomen is nondistended, soft and nontender.  Central nervous system: Alert and oriented. No focal neurological deficits. Extremities: No pedal edema. Skin: No rashes, lesions or ulcers Psychiatry: Judgement and insight appear normal. Mood & affect appropriate.     Data Reviewed:    Labs: Basic Metabolic Panel: Recent Labs  Lab 06/30/18 1746 07/01/18 0215 07/01/18 0845 07/02/18 0408 07/02/18 0500  NA 135 135 137 137 137  K 3.7 3.8 3.6 3.6 3.5  CL 100 101  --   --  101  CO2 23 24  --   --  23  GLUCOSE 135* 154*  --   --  253*  BUN 27* 25*  --   --  39*  CREATININE 1.22* 1.02*  --   --  1.09*  CALCIUM 8.0* 7.8*  --   --  8.2*  MG  --  1.7  --   --  1.7   GFR Estimated Creatinine Clearance: 62.4 mL/min (A) (by C-G formula based on SCr of 1.09 mg/dL (H)). Liver Function Tests: Recent Labs  Lab 06/30/18 1746 07/01/18 0215 07/02/18 0500  AST 33 32 37  ALT 31 29 30   ALKPHOS 38 35* 42  BILITOT 0.4 0.6 0.5  PROT 7.1 6.3* 7.5  ALBUMIN 3.3* 2.9* 3.2*   No results for input(s): LIPASE, AMYLASE in the last 168 hours. No results for input(s): AMMONIA in the last 168 hours. Coagulation profile No results for input(s): INR, PROTIME in the last 168 hours. COVID-19 Labs   Recent Labs    06/30/18 1746 07/01/18 0215 07/02/18 0500  DDIMER 1.15*  --  0.83*  FERRITIN 138  --   --   LDH 267*  --   --   CRP 5.2* 6.1*  --     No results found for: SARSCOV2NAA  CBC: Recent Labs  Lab 06/30/18 1746 07/01/18 0215 07/01/18 0845 07/02/18 0408 07/02/18 0500  WBC 4.1 4.3  --   --  5.2  NEUTROABS 2.9 3.4  --   --  4.4  HGB 11.7* 11.1* 11.6* 11.6* 11.8*  HCT 36.0 34.6* 34.0* 34.0* 36.6  MCV 91.4 91.8  --   --  89.9  PLT 159 155  --   --  205   Cardiac Enzymes: Recent Labs  Lab 06/30/18 1808 07/01/18 0215 07/02/18 0500  CKTOTAL  --  147 192  TROPONINI <0.03 <0.03  --    BNP (last 3 results) No results for input(s): PROBNP in the last 8760 hours. CBG: Recent Labs  Lab 07/01/18 1917 07/01/18 2036 07/01/18 2323 07/02/18 0310 07/02/18 0737  GLUCAP 231* 297* 270* 263* 260*   D-Dimer: Recent Labs    06/30/18 1746 07/02/18 0500  DDIMER 1.15* 0.83*   Hgb A1c: No results for input(s): HGBA1C in the last 72 hours. Lipid Profile: Recent Labs    06/30/18 1746  TRIG 199*   Thyroid function studies: No results for input(s): TSH, T4TOTAL, T3FREE, THYROIDAB in the last 72 hours.  Invalid input(s): FREET3 Anemia work up: Recent Labs    06/30/18 1746  FERRITIN 138   Sepsis Labs: Recent Labs  Lab 06/30/18 1727 06/30/18 1746 06/30/18 1927 07/01/18 0215 07/02/18 0500  PROCALCITON  --  <0.10  --   --   --   WBC  --  4.1  --  4.3 5.2  LATICACIDVEN 1.6  --  1.1  --   --    Microbiology Recent Results (from the past 240 hour(s))  Blood Culture (routine x 2)     Status: None (Preliminary result)   Collection Time: 06/30/18  5:46 PM  Result Value Ref Range Status   Specimen Description   Final    BLOOD LEFT ANTECUBITAL Performed at [facility], [place] [address] [place], [place] [address]    Special Requests   Final    BOTTLES DRAWN AEROBIC AND ANAEROBIC Blood Culture results may not be optimal due to an excessive  volume of blood received in culture bottles Performed at [facility] [address] [place], [place] [address]    Culture   Final    NO GROWTH 2 DAYS Performed at [facility] [address], [place], [place] [address]    Report Status PENDING  Incomplete  Blood Culture (routine x 2)     Status: None (Preliminary result)   Collection Time: 06/30/18  5:50 PM  Result Value Ref Range Status   Specimen Description   Final    BLOOD RIGHT ANTECUBITAL Performed at [facility] [address], [place], [place] [address]    Special Requests   Final    BOTTLES DRAWN AEROBIC AND ANAEROBIC Blood Culture adequate volume Performed at [facility] [address] [place], [place] [address]    Culture   Final    NO GROWTH 2 DAYS Performed at [facility] [address], [place], [place] [address]    Report Status PENDING  Incomplete     Medications:   . amLODipine  10 mg Oral q morning - 10a  . atorvastatin  80 mg Oral QPM  . benazepril  40 mg Oral QPM  . bisoprolol  10 mg Oral Daily  . enoxaparin (LOVENOX) injection  55 mg Subcutaneous Q12H  . escitalopram  20 mg Oral QPM  . insulin aspart  0-9 Units Subcutaneous Q4H  . methylPREDNISolone (SOLU-MEDROL) injection  60 mg Intravenous Q8H  . pantoprazole  40 mg Oral Daily  . sodium chloride flush  3 mL Intravenous Q12H  . sodium chloride flush  3 mL Intravenous Q12H  . vitamin C  500 mg Oral Daily  . zinc sulfate  220 mg Oral Daily   Continuous Infusions: . sodium  chloride    . azithromycin (ZITHROMAX) 500 MG IVPB (Vial-Mate [employer]or) 500 mg (07/01/18 2047)  . cefTRIAXone (ROCEPHIN)  IV 1 g (07/01/18 1902)    LOS: 2 days   Charlynne Cousins  [facility]  07/02/2018, 8:19 AM

## 2018-07-02 NOTE — Progress Notes (Signed)
NAME:  [name] [name], MRN:  [id number], DOB:  [birth year], LOS: 2 ADMISSION DATE:  06/30/2018, CONSULTATION DATE:  Jul 01, 2018 REFERRING MD:  Dr. Sloan Leiter, CHIEF COMPLAINT:  Dyspnea   Brief History   68 year old female with a past medical history significant for diabetes and obstructive sleep apnea was admitted 5/6 with acute respiratory failure with hypoxemia secondary to COVID-19.  Transferred to the ICU on 06/30/2018 for worsening hypoxemia and dyspnea.  Past Medical History  Obstructive sleep apnea Hypertension Diabetes mellitus Hypertension Hyperlipidemia GERD Depression Coronary artery disease  Significant Hospital Events   Jun 30, 2018 admitted Jul 01, 2018 moved to ICU for worsening hypoxemia and dyspnea  Consults:  Pulmonary and critical care medicine  Procedures:    Significant Diagnostic Tests:    Micro Data:  SARS-CoV-2 positive prior to admission at outside facility. 5/6 blood culture >   Antimicrobials:  5/7 ceftriaxone >  5/7 azithro >    Interim history/subjective:  Did not sleep very well Oxygenation about the same Only feels short of breath when she moves around  Objective   Blood pressure (!) 134/55, pulse 63, temperature 98.6 F (37 C), temperature source Axillary, resp. rate (!) 23, height 5\' 4"  (1.626 m), weight 112.5 kg, last menstrual period 11/25/2002, SpO2 94 %.    FiO2 (%):  [92 %-100 %] 100 %   Intake/Output Summary (Last 24 hours) at 07/02/2018 0750 Last data filed at 07/02/2018 0700 Gross per 24 hour  Intake 1053.39 ml  Output 1000 ml  Net 53.39 ml   Filed Weights   06/30/18 1715 07/01/18 1415  Weight: 110 kg 112.5 kg    Examination:  General:  Resting comfortably in bed HENT: NCAT OP clear PULM: Crackles bases B, normal effort CV: RRR, no mgr GI: BS+, soft, nontender MSK: normal bulk and tone Neuro: awake, alert, no distress, MAEW   Resolved Hospital Problem list     Assessment & Plan:  Acute respiratory  failure with hypoxemia due to COVID-19 pneumonia: Monitor in ICU setting Monitor closely for intubation if needed, at this time not necessary Monitor for nasal flaring, accessory muscle use, paradoxical breathing Encouraged to lie in prone position Out of bed to chair today Repeat Actemra today Continue to monitor serum inflammatory biomarkers  Obstructive sleep apnea: Hold CPAP for now  Possible bacterial community-acquired pneumonia: Monitor cultures Continue ceftriaxone and azithromycin  Hypertension: Telemetry monitoring Monitor hemodynamics in ICU environment Antihypertensives per home regimen (bisoprolol, benazepril, amlodipine)  Diabetes mellitus type 2 Per [facility]  Best practice:  Diet: regular diet Pain/Anxiety/Delirium protocol (if indicated): n/a VAP protocol (if indicated): n/a DVT prophylaxis: lovenox 0.5mg /kg sub q bid GI prophylaxis: PPI Glucose control: SSI Mobility: to chair Code Status: full Family Communication: updated daughter 5/8 Disposition: remain in ICU  Labs   CBC: Recent Labs  Lab 06/30/18 1746 07/01/18 0215 07/01/18 0845 07/02/18 0408 07/02/18 0500  WBC 4.1 4.3  --   --  5.2  NEUTROABS 2.9 3.4  --   --  PENDING  HGB 11.7* 11.1* 11.6* 11.6* 11.8*  HCT 36.0 34.6* 34.0* 34.0* 36.6  MCV 91.4 91.8  --   --  89.9  PLT 159 155  --   --  409    Basic Metabolic Panel: Recent Labs  Lab 06/30/18 1746 07/01/18 0215 07/01/18 0845 07/02/18 0408  NA 135 135 137 137  K 3.7 3.8 3.6 3.6  CL 100 101  --   --   CO2 23 24  --   --  GLUCOSE 135* 154*  --   --   BUN 27* 25*  --   --   CREATININE 1.22* 1.02*  --   --   CALCIUM 8.0* 7.8*  --   --   MG  --  1.7  --   --    GFR: Estimated Creatinine Clearance: 66.6 mL/min (A) (by C-G formula based on SCr of 1.02 mg/dL (H)). Recent Labs  Lab 06/30/18 1727 06/30/18 1746 06/30/18 1927 07/01/18 0215 07/02/18 0500  PROCALCITON  --  <0.10  --   --   --   WBC  --  4.1  --  4.3 5.2  LATICACIDVEN  1.6  --  1.1  --   --     Liver Function Tests: Recent Labs  Lab 06/30/18 1746 07/01/18 0215  AST 33 32  ALT 31 29  ALKPHOS 38 35*  BILITOT 0.4 0.6  PROT 7.1 6.3*  ALBUMIN 3.3* 2.9*   No results for input(s): LIPASE, AMYLASE in the last 168 hours. No results for input(s): AMMONIA in the last 168 hours.  ABG    Component Value Date/Time   PHART 7.387 07/02/2018 0408   PCO2ART 38.9 07/02/2018 0408   PO2ART 71.0 (L) 07/02/2018 0408   HCO3 23.4 07/02/2018 0408   TCO2 25 07/02/2018 0408   ACIDBASEDEF 1.0 07/02/2018 0408   O2SAT 94.0 07/02/2018 0408     Coagulation Profile: No results for input(s): INR, PROTIME in the last 168 hours.  Cardiac Enzymes: Recent Labs  Lab 06/30/18 1808 07/01/18 0215  CKTOTAL  --  147  TROPONINI <0.03 <0.03    HbA1C: Hgb A1c MFr Bld  Date/Time Value Ref Range Status  02/01/2018 11:25 AM 6.7 (H) 4.8 - 5.6 % Final    Comment:             Prediabetes: 5.7 - 6.4          Diabetes: >6.4          Glycemic control for adults with diabetes: <7.0   10/06/2017 11:35 AM 6.7 (H) 4.8 - 5.6 % Final    Comment:             Prediabetes: 5.7 - 6.4          Diabetes: >6.4          Glycemic control for adults with diabetes: <7.0     CBG: Recent Labs  Lab 07/01/18 1917 07/01/18 2036 07/01/18 2323 07/02/18 0310 07/02/18 0737  GLUCAP 231* 297* 270* 263* 260*       Critical care time: 31 minutes    Roselie Awkward, MD [facility] Pager: [phone] Cell: [phone] If no response, call [phone]

## 2018-07-03 ENCOUNTER — Inpatient Hospital Stay (HOSPITAL_COMMUNITY): Payer: Medicare Other

## 2018-07-03 DIAGNOSIS — J810 Acute pulmonary edema: Secondary | ICD-10-CM

## 2018-07-03 DIAGNOSIS — I10 Essential (primary) hypertension: Secondary | ICD-10-CM

## 2018-07-03 DIAGNOSIS — J9601 Acute respiratory failure with hypoxia: Secondary | ICD-10-CM

## 2018-07-03 LAB — POCT I-STAT 7, (LYTES, BLD GAS, ICA,H+H)
Acid-base deficit: 2 mmol/L (ref 0.0–2.0)
Bicarbonate: 22.6 mmol/L (ref 20.0–28.0)
Calcium, Ion: 1.12 mmol/L — ABNORMAL LOW (ref 1.15–1.40)
HCT: 37 % (ref 36.0–46.0)
Hemoglobin: 12.6 g/dL (ref 12.0–15.0)
O2 Saturation: 83 %
Patient temperature: 97.4
Potassium: 3.9 mmol/L (ref 3.5–5.1)
Sodium: 137 mmol/L (ref 135–145)
TCO2: 24 mmol/L (ref 22–32)
pCO2 arterial: 35.2 mmHg (ref 32.0–48.0)
pH, Arterial: 7.412 (ref 7.350–7.450)
pO2, Arterial: 45 mmHg — ABNORMAL LOW (ref 83.0–108.0)

## 2018-07-03 LAB — CK: Total CK: 124 U/L (ref 38–234)

## 2018-07-03 LAB — COMPREHENSIVE METABOLIC PANEL
ALT: 36 U/L (ref 0–44)
AST: 45 U/L — ABNORMAL HIGH (ref 15–41)
Albumin: 3 g/dL — ABNORMAL LOW (ref 3.5–5.0)
Alkaline Phosphatase: 46 U/L (ref 38–126)
Anion gap: 13 (ref 5–15)
BUN: 47 mg/dL — ABNORMAL HIGH (ref 8–23)
CO2: 26 mmol/L (ref 22–32)
Calcium: 8.3 mg/dL — ABNORMAL LOW (ref 8.9–10.3)
Chloride: 101 mmol/L (ref 98–111)
Creatinine, Ser: 1.1 mg/dL — ABNORMAL HIGH (ref 0.44–1.00)
GFR calc Af Amer: >60 mL/min (ref >60)
GFR calc non Af Amer: 52 mL/min — ABNORMAL LOW (ref >60)
Glucose, Bld: 317 mg/dL — ABNORMAL HIGH (ref 70–99)
Potassium: 4.1 mmol/L (ref 3.5–5.1)
Sodium: 140 mmol/L (ref 135–145)
Total Bilirubin: 0.4 mg/dL (ref 0.3–1.2)
Total Protein: 6.9 g/dL (ref 6.5–8.1)

## 2018-07-03 LAB — D-DIMER, QUANTITATIVE: D-Dimer, Quant: 0.72 ug/mL-FEU — ABNORMAL HIGH (ref 0.00–0.50)

## 2018-07-03 LAB — CBC WITH DIFFERENTIAL/PLATELET
Abs Immature Granulocytes: 0.22 10*3/uL — ABNORMAL HIGH (ref 0.00–0.07)
Basophils Absolute: 0 10*3/uL (ref 0.0–0.1)
Basophils Relative: 0 %
Eosinophils Absolute: 0 10*3/uL (ref 0.0–0.5)
Eosinophils Relative: 0 %
HCT: 36.8 % (ref 36.0–46.0)
Hemoglobin: 11.9 g/dL — ABNORMAL LOW (ref 12.0–15.0)
Immature Granulocytes: 2 %
Lymphocytes Relative: 6 %
Lymphs Abs: 0.7 10*3/uL (ref 0.7–4.0)
MCH: 29 pg (ref 26.0–34.0)
MCHC: 32.3 g/dL (ref 30.0–36.0)
MCV: 89.5 fL (ref 80.0–100.0)
Monocytes Absolute: 0.2 10*3/uL (ref 0.1–1.0)
Monocytes Relative: 2 %
Neutro Abs: 10.7 10*3/uL — ABNORMAL HIGH (ref 1.7–7.7)
Neutrophils Relative %: 90 %
Platelets: 322 10*3/uL (ref 150–400)
RBC: 4.11 MIL/uL (ref 3.87–5.11)
RDW: 13.2 % (ref 11.5–15.5)
WBC: 11.9 10*3/uL — ABNORMAL HIGH (ref 4.0–10.5)
nRBC: 0 % (ref 0.0–0.2)

## 2018-07-03 LAB — GLUCOSE, CAPILLARY
Glucose-Capillary: 260 mg/dL — ABNORMAL HIGH (ref 70–99)
Glucose-Capillary: 288 mg/dL — ABNORMAL HIGH (ref 70–99)
Glucose-Capillary: 292 mg/dL — ABNORMAL HIGH (ref 70–99)
Glucose-Capillary: 296 mg/dL — ABNORMAL HIGH (ref 70–99)
Glucose-Capillary: 308 mg/dL — ABNORMAL HIGH (ref 70–99)
Glucose-Capillary: 316 mg/dL — ABNORMAL HIGH (ref 70–99)
Glucose-Capillary: 328 mg/dL — ABNORMAL HIGH (ref 70–99)

## 2018-07-03 LAB — MAGNESIUM: Magnesium: 1.9 mg/dL (ref 1.7–2.4)

## 2018-07-03 LAB — C-REACTIVE PROTEIN: CRP: 4.4 mg/dL — ABNORMAL HIGH (ref <1.0)

## 2018-07-03 MED ORDER — INSULIN ASPART 100 UNIT/ML ~~LOC~~ SOLN
20.0000 [IU] | Freq: Once | SUBCUTANEOUS | Status: AC
  Administered 2018-07-03: 20 [IU] via SUBCUTANEOUS

## 2018-07-03 MED ORDER — ORAL CARE MOUTH RINSE
15.0000 mL | Freq: Two times a day (BID) | OROMUCOSAL | Status: DC
  Administered 2018-07-04 – 2018-07-05 (×2): 15 mL via OROMUCOSAL

## 2018-07-03 MED ORDER — INSULIN GLARGINE 100 UNIT/ML ~~LOC~~ SOLN
20.0000 [IU] | Freq: Two times a day (BID) | SUBCUTANEOUS | Status: DC
  Administered 2018-07-03 (×2): 20 [IU] via SUBCUTANEOUS
  Filled 2018-07-03 (×3): qty 0.2

## 2018-07-03 NOTE — Progress Notes (Signed)
On call MD notified and updated regarding patients inability to maintain oxygen saturations greater than 88%. Patient maintaining oxygen saturations 80-87% with 100% Non-rebreather mask and 15L Hi-Flow Nasal Cannula. Patient tachypneic at rest but work of breathing is not labored. Patient reports difficulty taking deep breaths and feeling as though she can not take a deep breath. Respiratory therapy notified and update ABG ordered and obtained. Results reported to On Call MD. Awaiting further orders at this time.

## 2018-07-03 NOTE — Progress Notes (Signed)
Placed pt on Heated HFNC at 40L/100% fio2,, will wean as tolerated. Pt denies SOB, no increased WOB. Pt resting comfortably. Droplet mask placed over pt's face per policy

## 2018-07-03 NOTE — Progress Notes (Signed)
Discussed with Dr.Ikard either the use of heated high flow or Bipap. Placed patient on bipap with IPAP set at 16cm and EPAP set at 8cm with 100% fio2. Sp02 increased to 91-94% with a respiratory rate of 20-25. Will continue to monitor patient.

## 2018-07-03 NOTE — Progress Notes (Signed)
Arterial blood gas drawn while patient was wearing a 100% NRB and 14 LPM high flow cannula. Results given to Dr.Ikard.

## 2018-07-03 NOTE — Progress Notes (Signed)
[facility] PROGRESS NOTE    Progress Note  [name] [name]  BLT:[id number] DOB: 1950-11-11 DOA: 06/30/2018 PCP: Vicenta Aly, FNP     Brief Narrative:   [name] [name] is an 68 y.o. female past medical history of essential hypertension, diabetes mellitus type 2, chronic diastolic heart failure obstructive sleep apnea tested positive for COVID-19 approximately 1 week ago into the ED she was found hypoxemic and respiratory failure transferred to [facility], post admission her hypoxemia worsened and subsequently transferred to the ICU on 07/01/2018. Events: 07/01/2018 moved to the ICU due to hypoxemia tachypnea  Micro: 06/30/2018 blood cultures have remained negative 06/30/2018 SARS-CoV-2 positive at outside facility.  Medications: 07/01/2018 IV Rocephin and azithro 07/01/2018 IV steroids 07/01/2018 Actemra 07/02/2018 convalescent plasma  Assessment/Plan:   Acute respiratory disease due to COVID-19 virus/SIRS due to COVID:: Currently satting 88 to 84% on high flow 15 L nasal cannula Patient cannot lay in prone position so laying on her side. She has received at term a, steroids and convalescent plasma. Her oxygen requirement continues to increase. Further management per critical care. Try incentive spirometry. Her inflammatory markers seems to be improving slowly.  COVID-19 Labs  Recent Labs    06/30/18 1746 07/01/18 0215 07/02/18 0500 07/03/18 0540  DDIMER 1.15*  --  0.83* 0.72*  FERRITIN 138  --   --   --   LDH 267*  --   --   --   CRP 5.2* 6.1* 10.3* 4.4*    No results found for: SARSCOV2NAA   Chronic diastolic heart failure: No signs of volume overload, continue IV Lasix strict I's and O's Daily weights.  Essential hypertension: Continue bisoprolol, benazepril and amlodipine. Continue IV Lasix intermittently  Diabetes mellitus type 2: She is currently on IV steroids. Blood glucose ranging greater than 200 will start on low-dose long-acting insulin.   Obstructive sleep apnea  Morbid obesity with alveolar hypoventilation ([place])   DVT prophylaxis: lovenox Family Communication:none Disposition Plan/Barrier to D/C: unable to determine Code Status:     Code Status Orders  (From admission, onward)         Start     Ordered   07/01/18 0040  Full code  Continuous     07/01/18 0040        Code Status History    Date Active Date Inactive Code Status Order ID Comments User Context   06/30/2018 2251 07/01/2018 0040 Full Code [id number]  Toy Baker, MD ED   11/29/2013 1958 11/30/2013 1340 Full Code [id number]  Adrian Prows, MD Inpatient   11/29/2013 1704 11/29/2013 1958 Full Code [id number]  Adrian Prows, MD ED   09/02/2013 1708 09/03/2013 1908 Full Code [id number]  Penelope Coop Inpatient   08/02/2012 1639 08/04/2012 1709 Full Code [id number]  Erlene Senters, PA-C Inpatient        IV Access:    Peripheral IV   Procedures and diagnostic studies:   Dg Chest Port 1v Same Day  Result Date: 07/01/2018 CLINICAL DATA:  68 year old female with shortness of breath EXAM: PORTABLE CHEST 1 VIEW COMPARISON:  06/30/2018 FINDINGS: Cardiomediastinal silhouette unchanged in size and contour. Patchy airspace and interstitial opacities the bilateral lungs, similar to the comparison. No large pleural effusion or pneumothorax. No displaced fracture. IMPRESSION: Similar appearance of the chest x-ray with mixed interstitial and airspace opacities bilaterally. Electronically Signed   By: Corrie Mckusick D.O.   On: 07/01/2018 09:52     Medical Consultants:    None.  Anti-Infectives:  IV Rocephin and azithromycin  Subjective:    [name] [name] relates her breathing is unchanged compared to yesterday.  Objective:    Vitals:   07/03/18 0615 07/03/18 0630 07/03/18 0645 07/03/18 0700  BP:    138/61  Pulse: (!) 58 (!) 55 (!) 54 (!) 56  Resp: 18 20 (!) 21 (!) 23  Temp:      TempSrc:      SpO2: 94% 91% 98% 94%  Weight:      Height:         Intake/Output Summary (Last 24 hours) at 07/03/2018 0756 Last data filed at 07/03/2018 0400 Gross per 24 hour  Intake 1000 ml  Output 1251 ml  Net -251 ml   Filed Weights   06/30/18 1715 07/01/18 1415  Weight: 110 kg 112.5 kg    Exam: General exam: Laying comfortably in bed in no acute distress. Respiratory system: Good air movement and clear to auscultation. Cardiovascular system: Rate and rhythm with a positive S1-S2 Gastrointestinal system: Positive bowel sounds soft nontender nondistended Central nervous system: Awake alert and oriented x3 able to carry on a conversation. Psychiatry: Judgement and insight appear normal. Mood & affect appropriate.     Data Reviewed:    Labs: Basic Metabolic Panel: Recent Labs  Lab 06/30/18 1746 07/01/18 0215 07/01/18 0845 07/02/18 0408 07/02/18 0500 07/03/18 0009 07/03/18 0540  NA 135 135 137 137 137 137 140  K 3.7 3.8 3.6 3.6 3.5 3.9 4.1  CL 100 101  --   --  101  --  101  CO2 23 24  --   --  23  --  26  GLUCOSE 135* 154*  --   --  253*  --  317*  BUN 27* 25*  --   --  39*  --  47*  CREATININE 1.22* 1.02*  --   --  1.09*  --  1.10*  CALCIUM 8.0* 7.8*  --   --  8.2*  --  8.3*  MG  --  1.7  --   --  1.7  --  1.9   GFR Estimated Creatinine Clearance: 61.8 mL/min (A) (by C-G formula based on SCr of 1.1 mg/dL (H)). Liver Function Tests: Recent Labs  Lab 06/30/18 1746 07/01/18 0215 07/02/18 0500 07/03/18 0540  AST 33 32 37 45*  ALT 31 29 30  36  ALKPHOS 38 35* 42 46  BILITOT 0.4 0.6 0.5 0.4  PROT 7.1 6.3* 7.5 6.9  ALBUMIN 3.3* 2.9* 3.2* 3.0*   No results for input(s): LIPASE, AMYLASE in the last 168 hours. No results for input(s): AMMONIA in the last 168 hours. Coagulation profile No results for input(s): INR, PROTIME in the last 168 hours. COVID-19 Labs  Recent Labs    06/30/18 1746 07/01/18 0215 07/02/18 0500 07/03/18 0540  DDIMER 1.15*  --  0.83* 0.72*  FERRITIN 138  --   --   --   LDH 267*  --   --   --    CRP 5.2* 6.1* 10.3* 4.4*    No results found for: SARSCOV2NAA  CBC: Recent Labs  Lab 06/30/18 1746 07/01/18 0215 07/01/18 0845 07/02/18 0408 07/02/18 0500 07/03/18 0009 07/03/18 0540  WBC 4.1 4.3  --   --  5.2  --  11.9*  NEUTROABS 2.9 3.4  --   --  4.4  --  10.7*  HGB 11.7* 11.1* 11.6* 11.6* 11.8* 12.6 11.9*  HCT 36.0 34.6* 34.0* 34.0* 36.6 37.0 36.8  MCV 91.4  91.8  --   --  89.9  --  89.5  PLT 159 155  --   --  205  --  322   Cardiac Enzymes: Recent Labs  Lab 06/30/18 1808 07/01/18 0215 07/02/18 0500 07/03/18 0540  CKTOTAL  --  147 192 124  TROPONINI <0.03 <0.03  --   --    BNP (last 3 results) No results for input(s): PROBNP in the last 8760 hours. CBG: Recent Labs  Lab 07/02/18 1548 07/02/18 2226 07/03/18 0030 07/03/18 0400 07/03/18 0707  GLUCAP 316* 321* 308* 328* 288*   D-Dimer: Recent Labs    07/02/18 0500 07/03/18 0540  DDIMER 0.83* 0.72*   Hgb A1c: No results for input(s): HGBA1C in the last 72 hours. Lipid Profile: Recent Labs    06/30/18 1746  TRIG 199*   Thyroid function studies: No results for input(s): TSH, T4TOTAL, T3FREE, THYROIDAB in the last 72 hours.  Invalid input(s): FREET3 Anemia work up: Recent Labs    06/30/18 1746  FERRITIN 138   Sepsis Labs: Recent Labs  Lab 06/30/18 1727 06/30/18 1746 06/30/18 1927 07/01/18 0215 07/02/18 0500 07/03/18 0540  PROCALCITON  --  <0.10  --   --   --   --   WBC  --  4.1  --  4.3 5.2 11.9*  LATICACIDVEN 1.6  --  1.1  --   --   --    Microbiology Recent Results (from the past 240 hour(s))  Blood Culture (routine x 2)     Status: None (Preliminary result)   Collection Time: 06/30/18  5:46 PM  Result Value Ref Range Status   Specimen Description   Final    BLOOD LEFT ANTECUBITAL Performed at [facility], [place] [address], [place], [place] [address]    Special Requests   Final    BOTTLES DRAWN AEROBIC AND ANAEROBIC Blood Culture results may not be optimal due  to an excessive volume of blood received in culture bottles Performed at [facility] [address] [place], [place] [address]    Culture   Final    NO GROWTH 2 DAYS Performed at [facility] [address] [place] [address] [place], [place] [address]    Report Status PENDING  Incomplete  Blood Culture (routine x 2)     Status: None (Preliminary result)   Collection Time: 06/30/18  5:50 PM  Result Value Ref Range Status   Specimen Description   Final    BLOOD RIGHT ANTECUBITAL Performed at [facility] [address], [place], [place] [address]    Special Requests   Final    BOTTLES DRAWN AEROBIC AND ANAEROBIC Blood Culture adequate volume Performed at [facility] [address], [place], [place] [address]    Culture   Final    NO GROWTH 2 DAYS Performed at [facility] [address] [place], [place] [address]    Report Status PENDING  Incomplete     Medications:   . amLODipine  10 mg Oral q morning - 10a  . atorvastatin  80 mg Oral QPM  . benazepril  40 mg Oral QPM  . bisoprolol  10 mg Oral Daily  . enoxaparin (LOVENOX) injection  55 mg Subcutaneous Q12H  . escitalopram  20 mg Oral QPM  . furosemide  40 mg Intravenous Q12H  . insulin aspart  0-15 Units Subcutaneous TID WC  . insulin aspart  0-5 Units Subcutaneous QHS  . insulin  aspart  3 Units Subcutaneous TID WC  . insulin glargine  10 Units Subcutaneous BID  . methylPREDNISolone (SOLU-MEDROL) injection  60 mg Intravenous Q8H  . pantoprazole  40 mg Oral Daily  . sodium chloride flush  3 mL Intravenous Q12H  . sodium chloride flush  3 mL Intravenous Q12H  . vitamin C  500 mg Oral Daily  . zinc sulfate  220 mg Oral Daily   Continuous Infusions: . sodium chloride 100 mL/hr at 07/02/18 2251  . azithromycin (ZITHROMAX) 500 MG IVPB (Vial-Mate [employer]) Stopped (07/03/18 0100)  . cefTRIAXone (ROCEPHIN)  IV Stopped (07/02/18 2335)    LOS: 3 days   [facility]  07/03/2018, 7:56 AM

## 2018-07-03 NOTE — Progress Notes (Signed)
NAME:  [name] [name], MRN:  [id number], DOB:  04-19-1950, LOS: 3 ADMISSION DATE:  06/30/2018, CONSULTATION DATE:  Jul 01, 2018 REFERRING MD:  Dr. Sloan Leiter, CHIEF COMPLAINT:  Dyspnea   Brief History   68 year old female with a past medical history significant for diabetes and obstructive sleep apnea was admitted 5/6 with acute respiratory failure with hypoxemia secondary to COVID-19.  Transferred to the ICU on 06/30/2018 for worsening hypoxemia and dyspnea.  Past Medical History  Obstructive sleep apnea Hypertension Diabetes mellitus Hypertension Hyperlipidemia GERD Depression Coronary artery disease  Significant Hospital Events   May 6 admitted May 7 moved to ICU for worsening hypoxemia and dyspnea May 8 > BIPAP started overnight for OSA, tolerated well May 9 Heated high flow started, tolerating well  Consults:  Pulmonary and critical care medicine  Procedures:    Significant Diagnostic Tests:    Micro Data:  SARS-CoV-2 positive prior to admission at outside facility. 5/6 blood culture >   Antimicrobials:/COVID Treatment  5/7 ceftriaxone >  5/7 azithro >   5/7 actemra/convalescent plasma 5/8 actemra  Interim history/subjective:   BIPAP started overnight for OSA, tolerated well Heated high flow started, tolerating well  Objective   Blood pressure (!) 141/43, pulse 61, temperature 99.1 F (37.3 C), temperature source Axillary, resp. rate (!) 24, height 5\' 4"  (1.626 m), weight 112.5 kg, last menstrual period 11/25/2002, SpO2 (!) 86 %.    Vent Mode: BIPAP FiO2 (%):  [100 %] 100 % Set Rate:  [15 bmp] 15 bmp PEEP:  [8 cmH20] 8 cmH20   Intake/Output Summary (Last 24 hours) at 07/03/2018 0820 Last data filed at 07/03/2018 0400 Gross per 24 hour  Intake 1000 ml  Output 1251 ml  Net -251 ml   Filed Weights   06/30/18 1715 07/01/18 1415  Weight: 110 kg 112.5 kg    Examination:  General:  Resting comfortably in bed HENT: NCAT OP clear PULM: Few crackles  bilaterally B, normal effort CV: RRR, no mgr GI: BS+, soft, nontender MSK: normal bulk and tone Neuro: awake, alert, no distress, MAEW   Resolved Hospital Problem list     Assessment & Plan:  Acute respiratory failure with hypoxemia due to COVID-19 pneumonia: worsening oxygenation Monitor closely in ICU Start heated high flow Continue to attempt awake prone positioning as tolerated, in lieu of this can rotate from side to side in bed Monitor for nasal flaring accessory muscle use, paradoxical breathing Monitor in ICU setting Monitor closely for intubation if needed, at this time not necessary Monitor for nasal flaring, accessory muscle use, paradoxical breathing Monitor inflammatory biomarkers Continue solumedrol  Obstructive sleep apnea: Nightly BIPAP with servo vent with filters  Possible bacterial community-acquired pneumonia: Continue ceftriaxone and azithro for now Monitor culture  Hypertension: Tele monitoring Monitor hemodynamics in ICU environment Continue antihypertensives per home regimen  Diabetes mellitus type 2 Per [facility]   Best practice:  Diet: regular diet Pain/Anxiety/Delirium protocol (if indicated): n/a VAP protocol (if indicated): n/a DVT prophylaxis: lovenox 0.5mg /kg sub q bid GI prophylaxis: PPI Glucose control: SSI Mobility: to chair Code Status: full Family Communication: updated daughter 5/8 Disposition: remain in ICU  Labs   CBC: Recent Labs  Lab 06/30/18 1746 07/01/18 0215 07/01/18 0845 07/02/18 0408 07/02/18 0500 07/03/18 0009 07/03/18 0540  WBC 4.1 4.3  --   --  5.2  --  11.9*  NEUTROABS 2.9 3.4  --   --  4.4  --  10.7*  HGB 11.7* 11.1* 11.6* 11.6* 11.8* 12.6  11.9*  HCT 36.0 34.6* 34.0* 34.0* 36.6 37.0 36.8  MCV 91.4 91.8  --   --  89.9  --  89.5  PLT 159 155  --   --  205  --  597    Basic Metabolic Panel: Recent Labs  Lab 06/30/18 1746 07/01/18 0215 07/01/18 0845 07/02/18 0408 07/02/18 0500 07/03/18 0009 07/03/18  0540  NA 135 135 137 137 137 137 140  K 3.7 3.8 3.6 3.6 3.5 3.9 4.1  CL 100 101  --   --  101  --  101  CO2 23 24  --   --  23  --  26  GLUCOSE 135* 154*  --   --  253*  --  317*  BUN 27* 25*  --   --  39*  --  47*  CREATININE 1.22* 1.02*  --   --  1.09*  --  1.10*  CALCIUM 8.0* 7.8*  --   --  8.2*  --  8.3*  MG  --  1.7  --   --  1.7  --  1.9   GFR: Estimated Creatinine Clearance: 61.8 mL/min (A) (by C-G formula based on SCr of 1.1 mg/dL (H)). Recent Labs  Lab 06/30/18 1727 06/30/18 1746 06/30/18 1927 07/01/18 0215 07/02/18 0500 07/03/18 0540  PROCALCITON  --  <0.10  --   --   --   --   WBC  --  4.1  --  4.3 5.2 11.9*  LATICACIDVEN 1.6  --  1.1  --   --   --     Liver Function Tests: Recent Labs  Lab 06/30/18 1746 07/01/18 0215 07/02/18 0500 07/03/18 0540  AST 33 32 37 45*  ALT 31 29 30  36  ALKPHOS 38 35* 42 46  BILITOT 0.4 0.6 0.5 0.4  PROT 7.1 6.3* 7.5 6.9  ALBUMIN 3.3* 2.9* 3.2* 3.0*   No results for input(s): LIPASE, AMYLASE in the last 168 hours. No results for input(s): AMMONIA in the last 168 hours.  ABG    Component Value Date/Time   PHART 7.412 07/03/2018 0009   PCO2ART 35.2 07/03/2018 0009   PO2ART 45.0 (L) 07/03/2018 0009   HCO3 22.6 07/03/2018 0009   TCO2 24 07/03/2018 0009   ACIDBASEDEF 2.0 07/03/2018 0009   O2SAT 83.0 07/03/2018 0009     Coagulation Profile: No results for input(s): INR, PROTIME in the last 168 hours.  Cardiac Enzymes: Recent Labs  Lab 06/30/18 1808 07/01/18 0215 07/02/18 0500 07/03/18 0540  CKTOTAL  --  147 192 124  TROPONINI <0.03 <0.03  --   --     HbA1C: Hgb A1c MFr Bld  Date/Time Value Ref Range Status  02/01/2018 11:25 AM 6.7 (H) 4.8 - 5.6 % Final    Comment:             Prediabetes: 5.7 - 6.4          Diabetes: >6.4          Glycemic control for adults with diabetes: <7.0   10/06/2017 11:35 AM 6.7 (H) 4.8 - 5.6 % Final    Comment:             Prediabetes: 5.7 - 6.4          Diabetes: >6.4           Glycemic control for adults with diabetes: <7.0     CBG: Recent Labs  Lab 07/02/18 1548 07/02/18 2226 07/03/18 0030 07/03/18 0400 07/03/18 4163  GLUCAP 316* [place]       Critical care time: 31 minutes    Roselie Awkward, MD [facility] Pager: [phone] Cell: [phone] If no response, call [phone]

## 2018-07-04 ENCOUNTER — Inpatient Hospital Stay (HOSPITAL_COMMUNITY): Payer: Medicare Other

## 2018-07-04 DIAGNOSIS — R0902 Hypoxemia: Secondary | ICD-10-CM

## 2018-07-04 DIAGNOSIS — G4733 Obstructive sleep apnea (adult) (pediatric): Secondary | ICD-10-CM

## 2018-07-04 LAB — COMPREHENSIVE METABOLIC PANEL
ALT: 42 U/L (ref 0–44)
AST: 19 U/L (ref 15–41)
Albumin: 3.3 g/dL — ABNORMAL LOW (ref 3.5–5.0)
Alkaline Phosphatase: 58 U/L (ref 38–126)
Anion gap: 15 (ref 5–15)
BUN: 48 mg/dL — ABNORMAL HIGH (ref 8–23)
CO2: 26 mmol/L (ref 22–32)
Calcium: 8.5 mg/dL — ABNORMAL LOW (ref 8.9–10.3)
Chloride: 101 mmol/L (ref 98–111)
Creatinine, Ser: 1.09 mg/dL — ABNORMAL HIGH (ref 0.44–1.00)
GFR calc Af Amer: >60 mL/min (ref >60)
GFR calc non Af Amer: 53 mL/min — ABNORMAL LOW (ref >60)
Glucose, Bld: 315 mg/dL — ABNORMAL HIGH (ref 70–99)
Potassium: 3.5 mmol/L (ref 3.5–5.1)
Sodium: 142 mmol/L (ref 135–145)
Total Bilirubin: 0.7 mg/dL (ref 0.3–1.2)
Total Protein: 7.3 g/dL (ref 6.5–8.1)

## 2018-07-04 LAB — CBC WITH DIFFERENTIAL/PLATELET
Abs Immature Granulocytes: 0.5 10*3/uL — ABNORMAL HIGH (ref 0.00–0.07)
Basophils Absolute: 0 10*3/uL (ref 0.0–0.1)
Basophils Relative: 0 %
Eosinophils Absolute: 0 10*3/uL (ref 0.0–0.5)
Eosinophils Relative: 0 %
HCT: 39.4 % (ref 36.0–46.0)
Hemoglobin: 12.8 g/dL (ref 12.0–15.0)
Immature Granulocytes: 3 %
Lymphocytes Relative: 4 %
Lymphs Abs: 0.7 10*3/uL (ref 0.7–4.0)
MCH: 29.3 pg (ref 26.0–34.0)
MCHC: 32.5 g/dL (ref 30.0–36.0)
MCV: 90.2 fL (ref 80.0–100.0)
Monocytes Absolute: 0.4 10*3/uL (ref 0.1–1.0)
Monocytes Relative: 3 %
Neutro Abs: 14.7 10*3/uL — ABNORMAL HIGH (ref 1.7–7.7)
Neutrophils Relative %: 90 %
Platelets: 364 10*3/uL (ref 150–400)
RBC: 4.37 MIL/uL (ref 3.87–5.11)
RDW: 13.4 % (ref 11.5–15.5)
WBC: 16.3 10*3/uL — ABNORMAL HIGH (ref 4.0–10.5)
nRBC: 0 % (ref 0.0–0.2)

## 2018-07-04 LAB — GLUCOSE, CAPILLARY
Glucose-Capillary: 203 mg/dL — ABNORMAL HIGH (ref 70–99)
Glucose-Capillary: 226 mg/dL — ABNORMAL HIGH (ref 70–99)
Glucose-Capillary: 237 mg/dL — ABNORMAL HIGH (ref 70–99)
Glucose-Capillary: 269 mg/dL — ABNORMAL HIGH (ref 70–99)
Glucose-Capillary: 289 mg/dL — ABNORMAL HIGH (ref 70–99)
Glucose-Capillary: 311 mg/dL — ABNORMAL HIGH (ref 70–99)
Glucose-Capillary: 327 mg/dL — ABNORMAL HIGH (ref 70–99)

## 2018-07-04 LAB — CK: Total CK: 84 U/L (ref 38–234)

## 2018-07-04 LAB — LIPASE, BLOOD: Lipase: 35 U/L (ref 11–51)

## 2018-07-04 LAB — C-REACTIVE PROTEIN: CRP: 2.3 mg/dL — ABNORMAL HIGH (ref <1.0)

## 2018-07-04 LAB — D-DIMER, QUANTITATIVE: D-Dimer, Quant: 0.89 ug/mL-FEU — ABNORMAL HIGH (ref 0.00–0.50)

## 2018-07-04 LAB — MAGNESIUM: Magnesium: 2.1 mg/dL (ref 1.7–2.4)

## 2018-07-04 MED ORDER — INSULIN GLARGINE 100 UNIT/ML ~~LOC~~ SOLN
40.0000 [IU] | Freq: Two times a day (BID) | SUBCUTANEOUS | Status: DC
  Administered 2018-07-04 (×2): 40 [IU] via SUBCUTANEOUS
  Filled 2018-07-04 (×3): qty 0.4

## 2018-07-04 MED ORDER — HYDROCODONE-ACETAMINOPHEN 5-325 MG PO TABS
1.0000 | ORAL_TABLET | ORAL | Status: DC | PRN
  Administered 2018-07-04 – 2018-07-05 (×2): 2 via ORAL
  Administered 2018-07-08 – 2018-07-09 (×2): 1 via ORAL
  Filled 2018-07-04 (×2): qty 1
  Filled 2018-07-04: qty 2
  Filled 2018-07-04: qty 1

## 2018-07-04 MED ORDER — ZOLPIDEM TARTRATE 5 MG PO TABS
5.0000 mg | ORAL_TABLET | Freq: Every evening | ORAL | Status: DC | PRN
  Administered 2018-07-04: 5 mg via ORAL
  Filled 2018-07-04: qty 1

## 2018-07-04 MED ORDER — FAMOTIDINE IN NACL 20-0.9 MG/50ML-% IV SOLN
20.0000 mg | Freq: Two times a day (BID) | INTRAVENOUS | Status: DC
  Administered 2018-07-04 – 2018-07-06 (×6): 20 mg via INTRAVENOUS
  Filled 2018-07-04 (×6): qty 50

## 2018-07-04 MED ORDER — INSULIN ASPART 100 UNIT/ML ~~LOC~~ SOLN
4.0000 [IU] | Freq: Once | SUBCUTANEOUS | Status: AC
  Administered 2018-07-04: 4 [IU] via SUBCUTANEOUS

## 2018-07-04 MED ORDER — POTASSIUM CHLORIDE CRYS ER 20 MEQ PO TBCR
40.0000 meq | EXTENDED_RELEASE_TABLET | Freq: Two times a day (BID) | ORAL | Status: AC
  Administered 2018-07-04 (×2): 40 meq via ORAL
  Filled 2018-07-04 (×2): qty 2

## 2018-07-04 NOTE — Progress Notes (Signed)
[facility] PROGRESS NOTE    Progress Note  [name] [name]  DSK:[id number] DOB: Jul 12, 1950 DOA: 06/30/2018 PCP: Vicenta Aly, FNP     Brief Narrative:   [name] [name] is an 68 y.o. female past medical history of essential hypertension, diabetes mellitus type 2, chronic diastolic heart failure obstructive sleep apnea tested positive for COVID-19 approximately 1 week ago into the ED she was found hypoxemic and respiratory failure transferred to [facility], post admission her hypoxemia worsened and subsequently transferred to the ICU on 07/01/2018. Events: 07/01/2018 moved to the ICU due to hypoxemia tachypnea  Micro: 06/30/2018 blood cultures have remained negative 06/30/2018 SARS-CoV-2 positive at outside facility.  Medications: 07/01/2018 IV Rocephin and azithro 07/01/2018 IV steroids 07/01/2018 Actemra 07/02/2018 convalescent plasma  Assessment/Plan:   Acute respiratory disease due to COVID-19 virus/SIRS due to COVID:: Currently satting 81 to 88% on high flow 40- 50 L nasal cannula, Appreciate PCCM assistance. Patient does not tolerate prone position. Her oxygen requirement continues to increase. Cont IC spirometry. Cont IV lasix, will try to keep on the dry side. Poorly recorded I and O's. Inflammatory markers improving. COVID-19 Labs  Recent Labs    07/02/18 0500 07/03/18 0540  DDIMER 0.83* 0.72*  CRP 10.3* 4.4*    No results found for: SARSCOV2NAA   Chronic diastolic heart failure: No signs of volume overload, continue IV Lasix strict I's and O's Daily weights. Good urine output on IV lasix, replete K as it is borderline.  Essential hypertension: Continue bisoprolol, benazepril and amlodipine. Continue IV Lasix intermittently  Diabetes mellitus type 2: Discontinue IV steroids. Blood glucose continues to rise increase long-acting insulin continue sliding scale.  New mild abdominal pain: Not tolerating her diet and complaining of mild abd pain, start  famotidine, check and abd x-ray. LFTs AST and ALT are 19 and 42, alkaline phosphatase 48. Check a lipase.  Obstructive sleep apnea  Morbid obesity with alveolar hypoventilation ([place])   DVT prophylaxis: lovenox Family Communication:none Disposition Plan/Barrier to D/C: unable to determine Code Status:     Code Status Orders  (From admission, onward)         Start     Ordered   07/01/18 0040  Full code  Continuous     07/01/18 0040        Code Status History    Date Active Date Inactive Code Status Order ID Comments User Context   06/30/2018 2251 07/01/2018 0040 Full Code [id number]  Toy Baker, MD ED   11/29/2013 1958 11/30/2013 1340 Full Code [id number]  Adrian Prows, MD Inpatient   11/29/2013 1704 11/29/2013 1958 Full Code [id number]  Adrian Prows, MD ED   09/02/2013 1708 09/03/2013 1908 Full Code [id number]  Penelope Coop Inpatient   08/02/2012 1639 08/04/2012 1709 Full Code [id number]  Erlene Senters, PA-C Inpatient        IV Access:    Peripheral IV   Procedures and diagnostic studies:   Dg Chest Port 1 View  Result Date: 07/03/2018 CLINICAL DATA:  Acute respiratory failure with hypoxemia. EXAM: PORTABLE CHEST 1 VIEW COMPARISON:  Radiograph Jul 01, 2018. FINDINGS: Stable cardiomediastinal silhouette. No pneumothorax is noted. Increased diffuse bilateral lung opacities are noted concerning for pneumonia. No definite pleural effusion is noted. Bony thorax is unremarkable. IMPRESSION: Increased diffuse bilateral lung opacities are noted most consistent with worsening pneumonia. Electronically Signed   By: Marijo Conception M.D.   On: 07/03/2018 09:07     Medical Consultants:  None.  Anti-Infectives:   IV Rocephin and azithromycin  Subjective:    [name] [name] she relates her breathing is unchanged compared to yesterday she is complaining of mild epigastric pain.  Objective:    Vitals:   07/04/18 0500 07/04/18 0515 07/04/18 0530 07/04/18 0545  BP:  (!) 162/69     Pulse: 65 64 65 63  Resp: (!) 25 (!) 26 (!) 23 20  Temp:      TempSrc:      SpO2: (!) 86% (!) 84% (!) 83% (!) 81%  Weight:      Height:        Intake/Output Summary (Last 24 hours) at 07/04/2018 0729 Last data filed at 07/04/2018 0400 Gross per 24 hour  Intake 1132.47 ml  Output 950 ml  Net 182.47 ml   Filed Weights   06/30/18 1715 07/01/18 1415  Weight: 110 kg 112.5 kg    Exam: General exam: Mildly restless in bed Respiratory system: Good air movement and clear to auscultation. Cardiovascular system: Rate and rhythm with positive S1 S2. Gastrointestinal system: Positive bowel sounds, epigastric tenderness no rebound or guarding no abdominal distention no organomegaly. Central nervous system: Awake alert and oriented x3 nonfocal.. Psychiatry: Judgment and insight appear normal mood and affect are appropriate.    Data Reviewed:    Labs: Basic Metabolic Panel: Recent Labs  Lab 06/30/18 1746 07/01/18 0215 07/01/18 0845 07/02/18 0408 07/02/18 0500 07/03/18 0009 07/03/18 0540  NA 135 135 137 137 137 137 140  K 3.7 3.8 3.6 3.6 3.5 3.9 4.1  CL 100 101  --   --  101  --  101  CO2 23 24  --   --  23  --  26  GLUCOSE 135* 154*  --   --  253*  --  317*  BUN 27* 25*  --   --  39*  --  47*  CREATININE 1.22* 1.02*  --   --  1.09*  --  1.10*  CALCIUM 8.0* 7.8*  --   --  8.2*  --  8.3*  MG  --  1.7  --   --  1.7  --  1.9   GFR Estimated Creatinine Clearance: 61.8 mL/min (A) (by C-G formula based on SCr of 1.1 mg/dL (H)). Liver Function Tests: Recent Labs  Lab 06/30/18 1746 07/01/18 0215 07/02/18 0500 07/03/18 0540  AST 33 32 37 45*  ALT 31 29 30  36  ALKPHOS 38 35* 42 46  BILITOT 0.4 0.6 0.5 0.4  PROT 7.1 6.3* 7.5 6.9  ALBUMIN 3.3* 2.9* 3.2* 3.0*   No results for input(s): LIPASE, AMYLASE in the last 168 hours. No results for input(s): AMMONIA in the last 168 hours. Coagulation profile No results for input(s): INR, PROTIME in the last 168 hours.  COVID-19 Labs  Recent Labs    07/02/18 0500 07/03/18 0540  DDIMER 0.83* 0.72*  CRP 10.3* 4.4*    No results found for: SARSCOV2NAA  CBC: Recent Labs  Lab 06/30/18 1746 07/01/18 0215 07/01/18 0845 07/02/18 0408 07/02/18 0500 07/03/18 0009 07/03/18 0540  WBC 4.1 4.3  --   --  5.2  --  11.9*  NEUTROABS 2.9 3.4  --   --  4.4  --  10.7*  HGB 11.7* 11.1* 11.6* 11.6* 11.8* 12.6 11.9*  HCT 36.0 34.6* 34.0* 34.0* 36.6 37.0 36.8  MCV 91.4 91.8  --   --  89.9  --  89.5  PLT 159 155  --   --  205  --  322   Cardiac Enzymes: Recent Labs  Lab 06/30/18 1808 07/01/18 0215 07/02/18 0500 07/03/18 0540  CKTOTAL  --  147 192 67  TROPONINI <0.03 <0.03  --   --    BNP (last 3 results) No results for input(s): PROBNP in the last 8760 hours. CBG: Recent Labs  Lab 07/03/18 1211 07/03/18 1630 07/03/18 2022 07/04/18 0015 07/04/18 0528  GLUCAP 260* 296* 316* 311* 289*   D-Dimer: Recent Labs    07/02/18 0500 07/03/18 0540  DDIMER 0.83* 0.72*   Hgb A1c: No results for input(s): HGBA1C in the last 72 hours. Lipid Profile: No results for input(s): CHOL, HDL, LDLCALC, TRIG, CHOLHDL, LDLDIRECT in the last 72 hours. Thyroid function studies: No results for input(s): TSH, T4TOTAL, T3FREE, THYROIDAB in the last 72 hours.  Invalid input(s): FREET3 Anemia work up: No results for input(s): VITAMINB12, FOLATE, FERRITIN, TIBC, IRON, RETICCTPCT in the last 72 hours. Sepsis Labs: Recent Labs  Lab 06/30/18 1727 06/30/18 1746 06/30/18 1927 07/01/18 0215 07/02/18 0500 07/03/18 0540  PROCALCITON  --  <0.10  --   --   --   --   WBC  --  4.1  --  4.3 5.2 11.9*  LATICACIDVEN 1.6  --  1.1  --   --   --    Microbiology Recent Results (from the past 240 hour(s))  Blood Culture (routine x 2)     Status: None (Preliminary result)   Collection Time: 06/30/18  5:46 PM  Result Value Ref Range Status   Specimen Description   Final    BLOOD LEFT ANTECUBITAL Performed at [facility], [place] [address], [place], [place] [address]    Special Requests   Final    BOTTLES DRAWN AEROBIC AND ANAEROBIC Blood Culture results may not be optimal due to an excessive volume of blood received in culture bottles Performed at [facility] [address], [place], [place] [address]    Culture   Final    NO GROWTH 2 DAYS Performed at [facility] [address], [place], [place] [address]    Report Status PENDING  Incomplete  Blood Culture (routine x 2)     Status: None (Preliminary result)   Collection Time: 06/30/18  5:50 PM  Result Value Ref Range Status   Specimen Description   Final    BLOOD RIGHT ANTECUBITAL Performed at [facility] [address], [place], [place] [address]    Special Requests   Final    BOTTLES DRAWN AEROBIC AND ANAEROBIC Blood Culture adequate volume Performed at [facility] [address], [place], [place] [address]    Culture   Final    NO GROWTH 2 DAYS Performed at [facility] [address], [place],  [address]    Report Status PENDING  Incomplete     Medications:   . amLODipine  10 mg Oral q morning - 10a  . atorvastatin  80 mg Oral QPM  . benazepril  40 mg Oral QPM  . bisoprolol  10 mg Oral Daily  . enoxaparin (LOVENOX) injection  55 mg Subcutaneous Q12H  . escitalopram  20 mg Oral QPM  . furosemide  40 mg Intravenous Q12H  . insulin aspart  0-15 Units Subcutaneous TID WC  . insulin aspart  0-5 Units Subcutaneous QHS  . insulin aspart  3 Units Subcutaneous TID WC  . insulin glargine  20 Units Subcutaneous BID  .  mouth rinse  15 mL Mouth Rinse BID  . methylPREDNISolone (SOLU-MEDROL) injection  60 mg Intravenous Q8H  . pantoprazole  40 mg Oral Daily  . sodium chloride flush  3 mL Intravenous Q12H  . sodium chloride flush  3 mL Intravenous Q12H  . vitamin C  500 mg Oral Daily  . zinc sulfate  220 mg Oral Daily   Continuous Infusions:  . sodium chloride 35 mL (07/03/18 2140)  . azithromycin (ZITHROMAX) 500 MG IVPB (Vial-Mate [employer]) Stopped (07/03/18 2342)  . cefTRIAXone (ROCEPHIN)  IV Stopped (07/03/18 2215)    LOS: 4 days   [facility]  07/04/2018, 7:29 AM

## 2018-07-04 NOTE — Progress Notes (Signed)
NAME:  [name] [name], MRN:  [id number], DOB:  [birth year], LOS: 4 ADMISSION DATE:  06/30/2018, CONSULTATION DATE:  Jul 01, 2018 REFERRING MD:  Dr. Sloan Leiter, CHIEF COMPLAINT:  Dyspnea   Brief History   68 year old female with a past medical history significant for diabetes and obstructive sleep apnea was admitted 5/6 with acute respiratory failure with hypoxemia secondary to COVID-19.  Transferred to the ICU on 06/30/2018 for worsening hypoxemia and dyspnea.  Past Medical History  Obstructive sleep apnea Hypertension Diabetes mellitus Hypertension Hyperlipidemia GERD Depression Coronary artery disease  Significant Hospital Events   May 6 admitted May 7 moved to ICU for worsening hypoxemia and dyspnea May 8 > BIPAP started overnight for OSA, tolerated well May 9 Heated high flow started, tolerating well  Consults:  Pulmonary and critical care medicine  Procedures:    Significant Diagnostic Tests:    Micro Data:  SARS-CoV-2 positive prior to admission at outside facility. 5/6 blood culture >   Antimicrobials:/COVID Treatment  5/7 ceftriaxone >  5/7 azithro >   5/7 actemra/convalescent plasma 5/8 actemra  Interim history/subjective:   HFNC started overnight due to desaturation, no further events overnight  Objective   Blood pressure (!) 155/57, pulse (!) 59, temperature 98.1 F (36.7 C), temperature source Axillary, resp. rate (!) 22, height 5\' 4"  (1.626 m), weight 112.5 kg, last menstrual period 11/25/2002, SpO2 92 %.    FiO2 (%):  [100 %] 100 %   Intake/Output Summary (Last 24 hours) at 07/04/2018 0803 Last data filed at 07/04/2018 8250 Gross per 24 hour  Intake 1132.47 ml  Output 1250 ml  Net -117.53 ml   Filed Weights   06/30/18 1715 07/01/18 1415  Weight: 110 kg 112.5 kg    Examination:  General:  Well appearing, nAD HENT: [place]/AT, PERRL, EOM-I and MMM PULM: Coarse BS diffusely CV: RRR, Nl S1/S2 and -M/R/G GI: Soft, NT, ND and +BS MSK: normal  bulk and tone Neuro: awake, alert, no distress, MAEW  I reviewed CXR myself, infiltrate noted  Resolved Hospital Problem list     Assessment & Plan:  Acute respiratory failure with hypoxemia due to COVID-19 pneumonia: worsening oxygenation Hold in the ICU to monitor closely Intubate if WOB becomes an issue but will hold off for now HFNC as ordered If desat then will awake prone BiPAP at night for OSA Monitor inflammatory biomarkers Continue solumedrol for now  Obstructive sleep apnea: Nightly BIPAP with servo vent with filters  Possible bacterial community-acquired pneumonia: Continue ceftriaxone and azithro for now Monitor culture  Hypertension: Tele monitoring Monitor hemodynamics in ICU environment Continue antihypertensives per home regimen  Diabetes mellitus type 2 Per [facility]   Best practice:  Diet: regular diet Pain/Anxiety/Delirium protocol (if indicated): n/a VAP protocol (if indicated): n/a DVT prophylaxis: lovenox 0.5mg /kg sub q bid GI prophylaxis: PPI Glucose control: SSI Mobility: to chair Code Status: full Family Communication: updated daughter 5/8 Disposition: remain in ICU  Labs   CBC: Recent Labs  Lab 06/30/18 1746 07/01/18 0215 07/01/18 0845 07/02/18 0408 07/02/18 0500 07/03/18 0009 07/03/18 0540  WBC 4.1 4.3  --   --  5.2  --  11.9*  NEUTROABS 2.9 3.4  --   --  4.4  --  10.7*  HGB 11.7* 11.1* 11.6* 11.6* 11.8* 12.6 11.9*  HCT 36.0 34.6* 34.0* 34.0* 36.6 37.0 36.8  MCV 91.4 91.8  --   --  89.9  --  89.5  PLT 159 155  --   --  205  --  308    Basic Metabolic Panel: Recent Labs  Lab 06/30/18 1746 07/01/18 0215 07/01/18 0845 07/02/18 0408 07/02/18 0500 07/03/18 0009 07/03/18 0540  NA 135 135 137 137 137 137 140  K 3.7 3.8 3.6 3.6 3.5 3.9 4.1  CL 100 101  --   --  101  --  101  CO2 23 24  --   --  23  --  26  GLUCOSE 135* 154*  --   --  253*  --  317*  BUN 27* 25*  --   --  39*  --  47*  CREATININE 1.22* 1.02*  --   --  1.09*   --  1.10*  CALCIUM 8.0* 7.8*  --   --  8.2*  --  8.3*  MG  --  1.7  --   --  1.7  --  1.9   GFR: Estimated Creatinine Clearance: 61.8 mL/min (A) (by C-G formula based on SCr of 1.1 mg/dL (H)). Recent Labs  Lab 06/30/18 1727 06/30/18 1746 06/30/18 1927 07/01/18 0215 07/02/18 0500 07/03/18 0540  PROCALCITON  --  <0.10  --   --   --   --   WBC  --  4.1  --  4.3 5.2 11.9*  LATICACIDVEN 1.6  --  1.1  --   --   --     Liver Function Tests: Recent Labs  Lab 06/30/18 1746 07/01/18 0215 07/02/18 0500 07/03/18 0540  AST 33 32 37 45*  ALT 31 29 30  36  ALKPHOS 38 35* 42 46  BILITOT 0.4 0.6 0.5 0.4  PROT 7.1 6.3* 7.5 6.9  ALBUMIN 3.3* 2.9* 3.2* 3.0*   No results for input(s): LIPASE, AMYLASE in the last 168 hours. No results for input(s): AMMONIA in the last 168 hours.  ABG    Component Value Date/Time   PHART 7.412 07/03/2018 0009   PCO2ART 35.2 07/03/2018 0009   PO2ART 45.0 (L) 07/03/2018 0009   HCO3 22.6 07/03/2018 0009   TCO2 24 07/03/2018 0009   ACIDBASEDEF 2.0 07/03/2018 0009   O2SAT 83.0 07/03/2018 0009     Coagulation Profile: No results for input(s): INR, PROTIME in the last 168 hours.  Cardiac Enzymes: Recent Labs  Lab 06/30/18 1808 07/01/18 0215 07/02/18 0500 07/03/18 0540  CKTOTAL  --  147 192 124  TROPONINI <0.03 <0.03  --   --     HbA1C: Hgb A1c MFr Bld  Date/Time Value Ref Range Status  02/01/2018 11:25 AM 6.7 (H) 4.8 - 5.6 % Final    Comment:             Prediabetes: 5.7 - 6.4          Diabetes: >6.4          Glycemic control for adults with diabetes: <7.0   10/06/2017 11:35 AM 6.7 (H) 4.8 - 5.6 % Final    Comment:             Prediabetes: 5.7 - 6.4          Diabetes: >6.4          Glycemic control for adults with diabetes: <7.0     CBG: Recent Labs  Lab 07/03/18 1211 07/03/18 1630 07/03/18 2022 07/04/18 0015 07/04/18 0528  GLUCAP 260* 296* 316* 311* 289*   The patient is critically ill with multiple organ systems failure and  requires high complexity decision making for assessment and support, frequent evaluation and titration of therapies, application of advanced monitoring  technologies and extensive interpretation of multiple databases.   Critical Care Time devoted to patient care services described in this note is  35  Minutes. This time reflects time of care of this signee Dr Jennet Maduro. This critical care time does not reflect procedure time, or teaching time or supervisory time of PA/NP/Med student/Med Resident etc but could involve care discussion time.  Rush Farmer, M.D. [facility] Pulmonary/Critical Care Medicine. Pager: [phone]. After hours pager: [phone].

## 2018-07-05 ENCOUNTER — Inpatient Hospital Stay (HOSPITAL_COMMUNITY): Payer: Medicare Other

## 2018-07-05 DIAGNOSIS — G9340 Encephalopathy, unspecified: Secondary | ICD-10-CM

## 2018-07-05 LAB — CBC WITH DIFFERENTIAL/PLATELET
Abs Immature Granulocytes: 0.8 10*3/uL — ABNORMAL HIGH (ref 0.00–0.07)
Basophils Absolute: 0.1 10*3/uL (ref 0.0–0.1)
Basophils Relative: 1 %
Eosinophils Absolute: 0 10*3/uL (ref 0.0–0.5)
Eosinophils Relative: 0 %
HCT: 40.6 % (ref 36.0–46.0)
Hemoglobin: 12.8 g/dL (ref 12.0–15.0)
Immature Granulocytes: 5 %
Lymphocytes Relative: 7 %
Lymphs Abs: 1.1 10*3/uL (ref 0.7–4.0)
MCH: 28.9 pg (ref 26.0–34.0)
MCHC: 31.5 g/dL (ref 30.0–36.0)
MCV: 91.6 fL (ref 80.0–100.0)
Monocytes Absolute: 0.5 10*3/uL (ref 0.1–1.0)
Monocytes Relative: 3 %
Neutro Abs: 13.7 10*3/uL — ABNORMAL HIGH (ref 1.7–7.7)
Neutrophils Relative %: 84 %
Platelets: 385 10*3/uL (ref 150–400)
RBC: 4.43 MIL/uL (ref 3.87–5.11)
RDW: 13.6 % (ref 11.5–15.5)
WBC: 16.2 10*3/uL — ABNORMAL HIGH (ref 4.0–10.5)
nRBC: 0.2 % (ref 0.0–0.2)

## 2018-07-05 LAB — CULTURE, BLOOD (ROUTINE X 2)
Culture: NO GROWTH
Culture: NO GROWTH
Special Requests: ADEQUATE

## 2018-07-05 LAB — GLUCOSE, CAPILLARY
Glucose-Capillary: 228 mg/dL — ABNORMAL HIGH (ref 70–99)
Glucose-Capillary: 229 mg/dL — ABNORMAL HIGH (ref 70–99)
Glucose-Capillary: 230 mg/dL — ABNORMAL HIGH (ref 70–99)
Glucose-Capillary: 246 mg/dL — ABNORMAL HIGH (ref 70–99)
Glucose-Capillary: 229 mg/dL — ABNORMAL HIGH (ref 70–99)
Glucose-Capillary: 221 mg/dL — ABNORMAL HIGH (ref 70–99)

## 2018-07-05 LAB — CK: Total CK: 61 U/L (ref 38–234)

## 2018-07-05 LAB — COMPREHENSIVE METABOLIC PANEL
ALT: 60 U/L — ABNORMAL HIGH (ref 0–44)
AST: 64 U/L — ABNORMAL HIGH (ref 15–41)
Albumin: 3.5 g/dL (ref 3.5–5.0)
Alkaline Phosphatase: 71 U/L (ref 38–126)
Anion gap: 12 (ref 5–15)
BUN: 45 mg/dL — ABNORMAL HIGH (ref 8–23)
CO2: 29 mmol/L (ref 22–32)
Calcium: 8.9 mg/dL (ref 8.9–10.3)
Chloride: 105 mmol/L (ref 98–111)
Creatinine, Ser: 0.91 mg/dL (ref 0.44–1.00)
GFR calc Af Amer: >60 mL/min (ref >60)
GFR calc non Af Amer: >60 mL/min (ref >60)
Glucose, Bld: 230 mg/dL — ABNORMAL HIGH (ref 70–99)
Potassium: 3.9 mmol/L (ref 3.5–5.1)
Sodium: 146 mmol/L — ABNORMAL HIGH (ref 135–145)
Total Bilirubin: 0.8 mg/dL (ref 0.3–1.2)
Total Protein: 7.5 g/dL (ref 6.5–8.1)

## 2018-07-05 LAB — D-DIMER, QUANTITATIVE: D-Dimer, Quant: 1.57 ug/mL-FEU — ABNORMAL HIGH (ref 0.00–0.50)

## 2018-07-05 LAB — FERRITIN: Ferritin: 203 ng/mL (ref 11–307)

## 2018-07-05 LAB — C-REACTIVE PROTEIN: CRP: 1.1 mg/dL — ABNORMAL HIGH (ref <1.0)

## 2018-07-05 LAB — MAGNESIUM: Magnesium: 2.3 mg/dL (ref 1.7–2.4)

## 2018-07-05 MED ORDER — DIPHENHYDRAMINE HCL 25 MG PO CAPS
25.0000 mg | ORAL_CAPSULE | Freq: Every evening | ORAL | Status: DC | PRN
  Administered 2018-07-05 – 2018-07-12 (×6): 25 mg via ORAL
  Filled 2018-07-05 (×6): qty 1

## 2018-07-05 MED ORDER — MORPHINE SULFATE (PF) 2 MG/ML IV SOLN: 1.0000 mg | INTRAVENOUS | Status: DC | PRN

## 2018-07-05 MED ORDER — ORAL CARE MOUTH RINSE
15.0000 mL | Freq: Two times a day (BID) | OROMUCOSAL | Status: DC
  Administered 2018-07-05 – 2018-07-13 (×15): 15 mL via OROMUCOSAL

## 2018-07-05 MED ORDER — METHYLPREDNISOLONE SODIUM SUCC 125 MG IJ SOLR
60.0000 mg | Freq: Four times a day (QID) | INTRAMUSCULAR | Status: DC
  Administered 2018-07-05 – 2018-07-07 (×9): 60 mg via INTRAVENOUS
  Filled 2018-07-05 (×11): qty 2

## 2018-07-05 MED ORDER — INSULIN GLARGINE 100 UNIT/ML ~~LOC~~ SOLN
45.0000 [IU] | Freq: Two times a day (BID) | SUBCUTANEOUS | Status: DC
  Administered 2018-07-05: 45 [IU] via SUBCUTANEOUS
  Filled 2018-07-05: qty 0.45

## 2018-07-05 MED ORDER — CHLORHEXIDINE GLUCONATE 0.12 % MT SOLN
15.0000 mL | Freq: Two times a day (BID) | OROMUCOSAL | Status: DC
  Administered 2018-07-05 – 2018-07-13 (×16): 15 mL via OROMUCOSAL
  Filled 2018-07-05 (×13): qty 15

## 2018-07-05 MED ORDER — HYDRALAZINE HCL 20 MG/ML IJ SOLN
10.0000 mg | Freq: Four times a day (QID) | INTRAMUSCULAR | Status: DC | PRN
  Administered 2018-07-09: 10 mg via INTRAVENOUS
  Filled 2018-07-05: qty 1

## 2018-07-05 MED ORDER — INSULIN GLARGINE 100 UNIT/ML ~~LOC~~ SOLN
55.0000 [IU] | Freq: Two times a day (BID) | SUBCUTANEOUS | Status: DC
  Administered 2018-07-05 – 2018-07-06 (×2): 55 [IU] via SUBCUTANEOUS
  Filled 2018-07-05 (×2): qty 0.55

## 2018-07-05 NOTE — Progress Notes (Signed)
Patient placed back on BIPAP due to desaturation.

## 2018-07-05 NOTE — Progress Notes (Signed)
[facility] PROGRESS NOTE    Progress Note  [name] [name]  DTO:[id number] DOB: Nov 10, 1950 DOA: 06/30/2018 PCP: Vicenta Aly, FNP     Brief Narrative:   [name] [name] is an 68 y.o. female past medical history of essential hypertension, diabetes mellitus type 2, chronic diastolic heart failure obstructive sleep apnea tested positive for COVID-19 approximately 1 week ago into the ED she was found hypoxemic and respiratory failure transferred to [facility], post admission her hypoxemia worsened and subsequently transferred to the ICU on 07/01/2018. Events: 07/01/2018 moved to the ICU due to hypoxemia tachypnea  Micro: 06/30/2018 blood cultures have remained negative 06/30/2018 SARS-CoV-2 positive at outside facility.  Medications: 07/01/2018 IV Rocephin and azithro 07/01/2018 IV steroids 07/01/2018 Actemra 07/02/2018 convalescent plasma  Assessment/Plan:   Acute respiratory disease due to COVID-19 virus/SIRS due to COVID:: Currently satting 89 to 93% on BiPAP appreciate PCCM assistance. Patient does not tolerate prone position. Further management per CCM appreciate assistance. Continue IV Lasix he has had a good response with brisk urine output she is now negative about 1 L, her creatinine continues to improve. Inflammatory markers are improved except for ferritin which had a mild rise. COVID-19 Labs  Recent Labs    07/03/18 0540 07/04/18 0535 07/05/18 0535  DDIMER 0.72* 0.89* 1.57*  CRP 4.4* 2.3* 1.1*    No results found for: SARSCOV2NAA   Chronic diastolic heart failure: Excellent diuresis with IV Lasix, her creatinine is improving with IV diuresis. Continue current dose of IV Lasix monitor electrolytes and replete as needed.  Essential hypertension: D/c bisoprolol, benazepril.  To new amlodipine, I to hydralazine IV PRN. Continue IV Lasix intermittently  Diabetes mellitus type 2: Blood glucose is persistently high, patient is currently on IV steroids,  currently on long-acting insulin plus sliding scale increase long-acting insulin.  New mild abdominal pain: Only ate about 25% of her food. I have personally reviewed abdominal x-ray does not show any ileus or perforation Continue IV famotidine Lipase was 35. She relates her abdominal pain is mildly improved.  Obstructive sleep apnea  Morbid obesity with alveolar hypoventilation ([place])   DVT prophylaxis: lovenox Family Communication:none Disposition Plan/Barrier to D/C: unable to determine Code Status:     Code Status Orders  (From admission, onward)         Start     Ordered   07/01/18 0040  Full code  Continuous     07/01/18 0040        Code Status History    Date Active Date Inactive Code Status Order ID Comments User Context   06/30/2018 2251 07/01/2018 0040 Full Code [id number]  Toy Baker, MD ED   11/29/2013 1958 11/30/2013 1340 Full Code [id number]  Adrian Prows, MD Inpatient   11/29/2013 1704 11/29/2013 1958 Full Code [id number]  Adrian Prows, MD ED   09/02/2013 1708 09/03/2013 1908 Full Code [id number]  Penelope Coop Inpatient   08/02/2012 1639 08/04/2012 1709 Full Code [id number]  Erlene Senters, PA-C Inpatient        IV Access:    Peripheral IV   Procedures and diagnostic studies:   Dg Abd 1 View  Result Date: 07/04/2018 CLINICAL DATA:  Abdominal pain EXAM: ABDOMEN - 1 VIEW COMPARISON:  06/30/2018 chest x-ray FINDINGS: Bibasilar opacities with central air bronchograms in the lower lobes, worse on the right concerning for basilar pneumonia. Scattered air throughout the bowel without significant obstruction pattern, dilatation or ileus. Remote cholecystectomy. Degenerative changes of the spine. Right hip hardware  partially imaged. Vascular calcifications noted. IMPRESSION: Nonobstructive bowel gas pattern. Bibasilar airspace process. Electronically Signed   By: Jerilynn Mages.  Shick M.D.   On: 07/04/2018 10:30   Dg Chest Port 1 View  Result Date: 07/03/2018 CLINICAL  DATA:  Acute respiratory failure with hypoxemia. EXAM: PORTABLE CHEST 1 VIEW COMPARISON:  Radiograph Jul 01, 2018. FINDINGS: Stable cardiomediastinal silhouette. No pneumothorax is noted. Increased diffuse bilateral lung opacities are noted concerning for pneumonia. No definite pleural effusion is noted. Bony thorax is unremarkable. IMPRESSION: Increased diffuse bilateral lung opacities are noted most consistent with worsening pneumonia. Electronically Signed   By: Marijo Conception M.D.   On: 07/03/2018 09:07     Medical Consultants:    None.  Anti-Infectives:   IV Rocephin and azithromycin  Subjective:    [name] [name] she relates her breathing is unchanged.  Objective:    Vitals:   07/04/18 2200 07/05/18 0000 07/05/18 0007 07/05/18 0405  BP: 135/70  (!) 166/62 (!) 144/66  Pulse: (!) 57  (!) 55 (!) 48  Resp: 18  (!) 92 (!) 35  Temp:  99.7 F (37.6 C)    TempSrc:  Axillary    SpO2: 98%  91% 93%  Weight:      Height:        Intake/Output Summary (Last 24 hours) at 07/05/2018 0750 Last data filed at 07/04/2018 1900 Gross per 24 hour  Intake 469.93 ml  Output 1900 ml  Net -1430.07 ml   Filed Weights   06/30/18 1715 07/01/18 1415  Weight: 110 kg 112.5 kg    Exam: General exam: Comfortably in bed in no acute distress. Respiratory system: Good air movement and clear to auscultation. Cardiovascular system: Rate and rhythm with positive S1-S2. Gastrointestinal system: Bowel sounds soft nontender nondistended. Central nervous system: Awake alert and oriented x3 nonfocal.    Data Reviewed:    Labs: Basic Metabolic Panel: Recent Labs  Lab 07/01/18 0215  07/02/18 0500 07/03/18 0009 07/03/18 0540 07/04/18 0535 07/05/18 0535  NA 135   < > 137 137 140 142 146*  K 3.8   < > 3.5 3.9 4.1 3.5 3.9  CL 101  --  101  --  101 101 105  CO2 24  --  23  --  26 26 29   GLUCOSE 154*  --  253*  --  317* 315* 230*  BUN 25*  --  39*  --  47* 48* 45*  CREATININE 1.02*  --   1.09*  --  1.10* 1.09* 0.91  CALCIUM 7.8*  --  8.2*  --  8.3* 8.5* 8.9  MG 1.7  --  1.7  --  1.9 2.1 2.3   < > = values in this interval not displayed.   GFR Estimated Creatinine Clearance: 74.7 mL/min (by C-G formula based on SCr of 0.91 mg/dL). Liver Function Tests: Recent Labs  Lab 07/01/18 0215 07/02/18 0500 07/03/18 0540 07/04/18 0535 07/05/18 0535  AST 32 37 45* 19 64*  ALT 29 30 36 42 60*  ALKPHOS 35* 42 46 58 71  BILITOT 0.6 0.5 0.4 0.7 0.8  PROT 6.3* 7.5 6.9 7.3 7.5  ALBUMIN 2.9* 3.2* 3.0* 3.3* 3.5   Recent Labs  Lab 07/04/18 0535  LIPASE 35   No results for input(s): AMMONIA in the last 168 hours. Coagulation profile No results for input(s): INR, PROTIME in the last 168 hours. [place]    07/03/18 0540 07/04/18 0535 07/05/18 0535  DDIMER 0.72*  0.89* 1.57*  CRP 4.4* 2.3* 1.1*    No results found for: SARSCOV2NAA  CBC: Recent Labs  Lab 07/01/18 0215  07/02/18 0500 07/03/18 0009 07/03/18 0540 07/04/18 0535 07/05/18 0535  WBC 4.3  --  5.2  --  11.9* 16.3* 16.2*  NEUTROABS 3.4  --  4.4  --  10.7* 14.7* 13.7*  HGB 11.1*   < > 11.8* 12.6 11.9* 12.8 12.8  HCT 34.6*   < > 36.6 37.0 36.8 39.4 40.6  MCV 91.8  --  89.9  --  89.5 90.2 91.6  PLT 155  --  205  --  322 364 385   < > = values in this interval not displayed.   Cardiac Enzymes: Recent Labs  Lab 06/30/18 1808 07/01/18 0215 07/02/18 0500 07/03/18 0540 07/04/18 0535 07/05/18 0535  CKTOTAL  --  147 192 124 84 61  TROPONINI <0.03 <0.03  --   --   --   --    BNP (last 3 results) No results for input(s): PROBNP in the last 8760 hours. CBG: Recent Labs  Lab 07/04/18 1139 07/04/18 1558 07/04/18 2041 07/04/18 2331 07/05/18 0357  GLUCAP 269* 226* 237* 203* 228*   D-Dimer: Recent Labs    07/04/18 0535 07/05/18 0535  DDIMER 0.89* 1.57*   Hgb A1c: No results for input(s): HGBA1C in the last 72 hours. Lipid Profile: No results for input(s): CHOL, HDL, LDLCALC,  TRIG, CHOLHDL, LDLDIRECT in the last 72 hours. Thyroid function studies: No results for input(s): TSH, T4TOTAL, T3FREE, THYROIDAB in the last 72 hours.  Invalid input(s): FREET3 Anemia work up: No results for input(s): VITAMINB12, FOLATE, FERRITIN, TIBC, IRON, RETICCTPCT in the last 72 hours. Sepsis Labs: Recent Labs  Lab 06/30/18 1727 06/30/18 1746 06/30/18 1927  07/02/18 0500 07/03/18 0540 07/04/18 0535 07/05/18 0535  PROCALCITON  --  <0.10  --   --   --   --   --   --   WBC  --  4.1  --    < > 5.2 11.9* 16.3* 16.2*  LATICACIDVEN 1.6  --  1.1  --   --   --   --   --    < > = values in this interval not displayed.   Microbiology Recent Results (from the past 240 hour(s))  Blood Culture (routine x 2)     Status: None (Preliminary result)   Collection Time: 06/30/18  5:46 PM  Result Value Ref Range Status   Specimen Description   Final    BLOOD LEFT ANTECUBITAL Performed at [facility] [address] [place], [place] [address]    Special Requests   Final    BOTTLES DRAWN AEROBIC AND ANAEROBIC Blood Culture results may not be optimal due to an excessive volume of blood received in culture bottles Performed at [facility] [address], [place], [place] [address]    Culture   Final    NO GROWTH 4 DAYS Performed at [facility], [place] [address] [place], [place] [address]    Report Status PENDING  Incomplete  Blood Culture (routine x 2)     Status: None (Preliminary result)   Collection Time: 06/30/18  5:50 PM  Result Value Ref Range Status   Specimen Description   Final    BLOOD RIGHT ANTECUBITAL Performed at [facility] [address] [place], [place] [address]    Special Requests   Final    BOTTLES DRAWN AEROBIC  AND ANAEROBIC Blood Culture adequate volume Performed at [place] [facility] [address] [place], [place] [address]    Culture   Final    NO GROWTH 4 DAYS Performed at  [facility], [place] [address] [place], [place] [address]    Report Status PENDING  Incomplete     Medications:   . amLODipine  10 mg Oral q morning - 10a  . atorvastatin  80 mg Oral QPM  . benazepril  40 mg Oral QPM  . bisoprolol  10 mg Oral Daily  . enoxaparin (LOVENOX) injection  55 mg Subcutaneous Q12H  . escitalopram  20 mg Oral QPM  . furosemide  40 mg Intravenous Q12H  . insulin aspart  0-15 Units Subcutaneous TID WC  . insulin aspart  0-5 Units Subcutaneous QHS  . insulin aspart  3 Units Subcutaneous TID WC  . insulin glargine  40 Units Subcutaneous BID  . mouth rinse  15 mL Mouth Rinse BID  . pantoprazole  40 mg Oral Daily  . sodium chloride flush  3 mL Intravenous Q12H  . sodium chloride flush  3 mL Intravenous Q12H  . vitamin C  500 mg Oral Daily  . zinc sulfate  220 mg Oral Daily   Continuous Infusions: . sodium chloride 35 mL (07/03/18 2140)  . azithromycin (ZITHROMAX) 500 MG IVPB (Vial-Mate [employer]) 500 mg (07/04/18 2059)  . cefTRIAXone (ROCEPHIN)  IV 1 g (07/04/18 2015)  . famotidine (PEPCID) IV 20 mg (07/04/18 2225)    LOS: 5 days   Charlynne Cousins  [facility]  07/05/2018, 7:50 AM

## 2018-07-05 NOTE — Progress Notes (Signed)
Patient taken off Bipap and placed on HFNC 50L, 100%.  Patient then assisted to prone position.  Patient's sats improved to 95% after a few minutes.  RT will continue to monitor.

## 2018-07-05 NOTE — Progress Notes (Signed)
Patient placed on Bipap due to increased WOB.  RN notified.

## 2018-07-05 NOTE — Progress Notes (Signed)
ABG results did not cross over for [facility] in [place]. Results are as follows on BIPAP   PC 8 RR-15 Fio2 100% Peep 8   Ref. Range 07/05/2018 05:17  Sample type Unknown ARTERIAL  pH, Arterial Latest Ref Range: 7.350 - 7.450  7.425  pCO2 arterial Latest Ref Range: 32.0 - 48.0 mmHg 48.4 (H)  pO2, Arterial Latest Ref Range: 83.0 - 108.0 mmHg 70.0 (L)  TCO2 Latest Ref Range: 22 - 32 mmol/L 33 (H)  Acid-Base Excess Latest Ref Range: 0.0 - 2.0 mmol/L 6.0 (H)  Bicarbonate Latest Ref Range: 20.0 - 28.0 mmol/L 31.8 (H)  O2 Saturation Latest Units: % 94.0  Patient temperature Unknown 98.6 F  Collection site Unknown RADIAL, ALLEN'S TEST ACCEPTABLE

## 2018-07-05 NOTE — Progress Notes (Signed)
Patient independently called her daughter this evening via her personal cell phone requesting for someone to come help her and that she needs to get out of here. She says "Im sick," "I need to go," and "I cant breathe." Patient did not report anything to nursing staff before making this phone call. Daughter reassures patient that the medical team would help her. The daughter asks her if she has reported these concerns to her nurse, and the patient says "hes standing right here." Now aware of patients concerns and reports of feeling sick with difficulty breathing, nursing staff reassures patient and daughter who was on speaker phone that I would attempt to resolve the issues by repositioning patient in the bed, get pain medication for complaints of 9/10 generalized pain, and have respiratory therapy evaluate patient and most likely go ahead and place patient on BiPap. Patient repositioned by nursing staff with assistance from Respiratory Therapy. Pain medication administered as ordered PRN pain. Patient placed on BiPap by respiratory therapy. Patient alert and oriented x 4.   Ambien administered at HS as ordered to assist the patient in getting a good nights rest. Patient noted to have become significantly confused within 90 minutes of ambien administration; this is completely unlike her orientation/behavior the past 2 nights. Patient messing with bedside monitor cords, attempting to pull off BiPap mask, and pulse ox probe. Patient at one moment reports that shes at home when assessing orientation/location. The next time when asked she says school. Attempts made to reorient patient to location and surroundings. Patient informed of current care team at bedside trying to care for her while she is in the hospital due to her difficulty breathing and illness. Patient placed back on BiPap after temporarily being placed on Heated High Flow Nasal Cannula during episode of restlessness and confusion. Patient fell back to  sleep at this time and is resting comfortably. Confusion after ambien administration to be discussed with MD during AM rounds. Will continue to monitor.

## 2018-07-05 NOTE — Progress Notes (Signed)
Patient placed on [place] by RN temporarily due to confusion and agitation. Pulling BIPAP mask off.  Patient oriented to place. Placed back on BIPAP. Tolerating BIPAP at this time. RT will continue to monitor.

## 2018-07-05 NOTE — Progress Notes (Signed)
NAME:  [name] [name], MRN:  [id number], DOB:  28-Feb-1950, LOS: 5 ADMISSION DATE:  06/30/2018, CONSULTATION DATE:  Jul 01, 2018 REFERRING MD:  Dr. Sloan Leiter, CHIEF COMPLAINT:  Dyspnea   Brief History   68 year old female with a past medical history significant for diabetes and obstructive sleep apnea was admitted 5/6 with acute respiratory failure with hypoxemia secondary to COVID-19.  Transferred to the ICU on 06/30/2018 for worsening hypoxemia and dyspnea.  Past Medical History  Obstructive sleep apnea Hypertension Diabetes mellitus Hypertension Hyperlipidemia GERD Depression Coronary artery disease  Significant Hospital Events   May 6 admitted May 7 moved to ICU for worsening hypoxemia and dyspnea May 8 > BIPAP started overnight for OSA, tolerated well May 9 Heated high flow started, tolerating well  Consults:  Pulmonary and critical care medicine  Procedures:    Significant Diagnostic Tests:    Micro Data:  SARS-CoV-2 positive prior to admission at outside facility. 5/6 blood culture >   Antimicrobials:/COVID Treatment  5/7 ceftriaxone >  5/7 azithro >   5/7 actemra/convalescent plasma 5/8 actemra  Interim history/subjective:   BiPAP and HFNC overnight No new complaints   Objective   Blood pressure (!) 155/57, pulse (!) 57, temperature 99.7 F (37.6 C), temperature source Axillary, resp. rate (!) 29, height 5\' 4"  (1.626 m), weight 112.5 kg, last menstrual period 11/25/2002, SpO2 91 %.    Vent Mode: BIPAP FiO2 (%):  [100 %] 100 % Set Rate:  [15 bmp] 15 bmp PEEP:  [8 cmH20] 8 cmH20 Plateau Pressure:  [18 cmH20] 18 cmH20   Intake/Output Summary (Last 24 hours) at 07/05/2018 7209 Last data filed at 07/04/2018 1900 Gross per 24 hour  Intake 469.93 ml  Output 1300 ml  Net -830.07 ml   Filed Weights   06/30/18 1715 07/01/18 1415  Weight: 110 kg 112.5 kg   Examination:  General:  Well appearing, nAD HENT: [place]/AT, PERRL, EOM-I and MMM PULM: Coarse  BS diffusely CV: RRR, Nl S1/S2 and -M/R/G GI: Soft, NT, ND and +BS MSK: normal bulk and tone Neuro: awake, alert, no distress, MAEW  Resolved Hospital Problem list     Assessment & Plan:  Acute respiratory failure with hypoxemia due to COVID-19 pneumonia: worsening oxygenation Hold in the ICU to monitor closely Prone Intubate if WOB becomes an issue but will hold off for now HFNC and BiPAP If desat then will awake prone BiPAP at night for OSA Monitor inflammatory biomarkers Continue solumedrol, patient and family understand that there are no recommendations at this time for solumedrol but would decrease inflammation, they understand that side effects of solumedrol but that we are trying the best treatments we have with limited recommendation of such a novel disease If unable to get off BiPAP today and remains required for tomorrow then will likely require intubation and patient is agreeable to that as long as it is not long term Repeat CXR for now Lasix 40 q12 and D/C benzopril and add hydralazine IV PRN  Obstructive sleep apnea: Nightly BIPAP with servo vent with filters  Possible bacterial community-acquired pneumonia: Continue ceftriaxone and azithro for now Monitor culture  Hypertension: Tele monitoring Monitor hemodynamics in ICU environment Continue antihypertensives per home regimen  Diabetes mellitus type 2 Per [facility]  Best practice:  Diet: regular diet Pain/Anxiety/Delirium protocol (if indicated): n/a VAP protocol (if indicated): n/a DVT prophylaxis: lovenox 0.5mg /kg sub q bid GI prophylaxis: PPI Glucose control: SSI Mobility: to chair Code Status: full Family Communication: updated daughter 5/8 Disposition: remain  in ICU  Labs   CBC: Recent Labs  Lab 07/01/18 0215  07/02/18 0500 07/03/18 0009 07/03/18 0540 07/04/18 0535 07/05/18 0535  WBC 4.3  --  5.2  --  11.9* 16.3* 16.2*  NEUTROABS 3.4  --  4.4  --  10.7* 14.7* 13.7*  HGB 11.1*   < > 11.8* 12.6  11.9* 12.8 12.8  HCT 34.6*   < > 36.6 37.0 36.8 39.4 40.6  MCV 91.8  --  89.9  --  89.5 90.2 91.6  PLT 155  --  205  --  322 364 385   < > = values in this interval not displayed.    Basic Metabolic Panel: Recent Labs  Lab 07/01/18 0215  07/02/18 0500 07/03/18 0009 07/03/18 0540 07/04/18 0535 07/05/18 0535  NA 135   < > 137 137 140 142 146*  K 3.8   < > 3.5 3.9 4.1 3.5 3.9  CL 101  --  101  --  101 101 105  CO2 24  --  23  --  26 26 29   GLUCOSE 154*  --  253*  --  317* 315* 230*  BUN 25*  --  39*  --  47* 48* 45*  CREATININE 1.02*  --  1.09*  --  1.10* 1.09* 0.91  CALCIUM 7.8*  --  8.2*  --  8.3* 8.5* 8.9  MG 1.7  --  1.7  --  1.9 2.1 2.3   < > = values in this interval not displayed.   GFR: Estimated Creatinine Clearance: 74.7 mL/min (by C-G formula based on SCr of 0.91 mg/dL). Recent Labs  Lab 06/30/18 1727 06/30/18 1746 06/30/18 1927  07/02/18 0500 07/03/18 0540 07/04/18 0535 07/05/18 0535  PROCALCITON  --  <0.10  --   --   --   --   --   --   WBC  --  4.1  --    < > 5.2 11.9* 16.3* 16.2*  LATICACIDVEN 1.6  --  1.1  --   --   --   --   --    < > = values in this interval not displayed.    Liver Function Tests: Recent Labs  Lab 07/01/18 0215 07/02/18 0500 07/03/18 0540 07/04/18 0535 07/05/18 0535  AST 32 37 45* 19 64*  ALT 29 30 36 42 60*  ALKPHOS 35* 42 46 58 71  BILITOT 0.6 0.5 0.4 0.7 0.8  PROT 6.3* 7.5 6.9 7.3 7.5  ALBUMIN 2.9* 3.2* 3.0* 3.3* 3.5   Recent Labs  Lab 07/04/18 0535  LIPASE 35   No results for input(s): AMMONIA in the last 168 hours.  ABG    Component Value Date/Time   PHART 7.412 07/03/2018 0009   PCO2ART 35.2 07/03/2018 0009   PO2ART 45.0 (L) 07/03/2018 0009   HCO3 22.6 07/03/2018 0009   TCO2 24 07/03/2018 0009   ACIDBASEDEF 2.0 07/03/2018 0009   O2SAT 83.0 07/03/2018 0009     Coagulation Profile: No results for input(s): INR, PROTIME in the last 168 hours.  Cardiac Enzymes: Recent Labs  Lab 06/30/18 1808  07/01/18 0215 07/02/18 0500 07/03/18 0540 07/04/18 0535 07/05/18 0535  CKTOTAL  --  147 192 124 84 61  TROPONINI <0.03 <0.03  --   --   --   --    HbA1C: Hgb A1c MFr Bld  Date/Time Value Ref Range Status  02/01/2018 11:25 AM 6.7 (H) 4.8 - 5.6 % Final    Comment:  Prediabetes: 5.7 - 6.4          Diabetes: >6.4          Glycemic control for adults with diabetes: <7.0   10/06/2017 11:35 AM 6.7 (H) 4.8 - 5.6 % Final    Comment:             Prediabetes: 5.7 - 6.4          Diabetes: >6.4          Glycemic control for adults with diabetes: <7.0    CBG: Recent Labs  Lab 07/04/18 1139 07/04/18 1558 07/04/18 2041 07/04/18 2331 07/05/18 0357  GLUCAP 269* 226* 237* 203* 228*   The patient is critically ill with multiple organ systems failure and requires high complexity decision making for assessment and support, frequent evaluation and titration of therapies, application of advanced monitoring technologies and extensive interpretation of multiple databases.   Critical Care Time devoted to patient care services described in this note is  35  Minutes. This time reflects time of care of this signee Dr Jennet Maduro. This critical care time does not reflect procedure time, or teaching time or supervisory time of PA/NP/Med student/Med Resident etc but could involve care discussion time.  Rush Farmer, M.D. [facility] Pulmonary/Critical Care Medicine. Pager: [phone]. After hours pager: [phone].

## 2018-07-06 DIAGNOSIS — J810 Acute pulmonary edema: Secondary | ICD-10-CM

## 2018-07-06 DIAGNOSIS — R0902 Hypoxemia: Secondary | ICD-10-CM

## 2018-07-06 DIAGNOSIS — J9601 Acute respiratory failure with hypoxia: Secondary | ICD-10-CM

## 2018-07-06 LAB — CBC
HCT: 39.9 % (ref 36.0–46.0)
Hemoglobin: 12.8 g/dL (ref 12.0–15.0)
MCH: 29.2 pg (ref 26.0–34.0)
MCHC: 32.1 g/dL (ref 30.0–36.0)
MCV: 91.1 fL (ref 80.0–100.0)
Platelets: 322 10*3/uL (ref 150–400)
RBC: 4.38 MIL/uL (ref 3.87–5.11)
RDW: 13.3 % (ref 11.5–15.5)
WBC: 15.8 10*3/uL — ABNORMAL HIGH (ref 4.0–10.5)
nRBC: 0.1 % (ref 0.0–0.2)

## 2018-07-06 LAB — MAGNESIUM: Magnesium: 2.2 mg/dL (ref 1.7–2.4)

## 2018-07-06 LAB — BASIC METABOLIC PANEL
Anion gap: 13 (ref 5–15)
BUN: 40 mg/dL — ABNORMAL HIGH (ref 8–23)
CO2: 31 mmol/L (ref 22–32)
Calcium: 8.7 mg/dL — ABNORMAL LOW (ref 8.9–10.3)
Chloride: 99 mmol/L (ref 98–111)
Creatinine, Ser: 0.88 mg/dL (ref 0.44–1.00)
GFR calc Af Amer: >60 mL/min (ref >60)
GFR calc non Af Amer: >60 mL/min (ref >60)
Glucose, Bld: 234 mg/dL — ABNORMAL HIGH (ref 70–99)
Potassium: 3.5 mmol/L (ref 3.5–5.1)
Sodium: 143 mmol/L (ref 135–145)

## 2018-07-06 LAB — GLUCOSE, CAPILLARY
Glucose-Capillary: 214 mg/dL — ABNORMAL HIGH (ref 70–99)
Glucose-Capillary: 238 mg/dL — ABNORMAL HIGH (ref 70–99)
Glucose-Capillary: 250 mg/dL — ABNORMAL HIGH (ref 70–99)
Glucose-Capillary: 270 mg/dL — ABNORMAL HIGH (ref 70–99)

## 2018-07-06 LAB — FERRITIN: Ferritin: 197 ng/mL (ref 11–307)

## 2018-07-06 LAB — PHOSPHORUS: Phosphorus: 4.1 mg/dL (ref 2.5–4.6)

## 2018-07-06 LAB — D-DIMER, QUANTITATIVE: D-Dimer, Quant: 1.86 ug/mL-FEU — ABNORMAL HIGH (ref 0.00–0.50)

## 2018-07-06 LAB — C-REACTIVE PROTEIN: CRP: <0.8 mg/dL (ref <1.0)

## 2018-07-06 MED ORDER — POTASSIUM CHLORIDE CRYS ER 20 MEQ PO TBCR: 20.0000 meq | EXTENDED_RELEASE_TABLET | Freq: Two times a day (BID) | ORAL | Status: DC

## 2018-07-06 MED ORDER — INSULIN GLARGINE 100 UNIT/ML ~~LOC~~ SOLN
65.0000 [IU] | Freq: Two times a day (BID) | SUBCUTANEOUS | Status: DC
  Administered 2018-07-06 – 2018-07-08 (×5): 65 [IU] via SUBCUTANEOUS
  Administered 2018-07-09: 30 [IU] via SUBCUTANEOUS
  Filled 2018-07-06 (×6): qty 0.65

## 2018-07-06 MED ORDER — CHLORHEXIDINE GLUCONATE CLOTH 2 % EX PADS
6.0000 | MEDICATED_PAD | Freq: Every day | CUTANEOUS | Status: DC
  Administered 2018-07-07 – 2018-07-12 (×4): 6 via TOPICAL

## 2018-07-06 MED ORDER — INSULIN GLARGINE 100 UNIT/ML ~~LOC~~ SOLN: 60.0000 [IU] | Freq: Two times a day (BID) | SUBCUTANEOUS | Status: DC

## 2018-07-06 MED ORDER — FUROSEMIDE 10 MG/ML IJ SOLN
40.0000 mg | Freq: Three times a day (TID) | INTRAMUSCULAR | Status: AC
  Administered 2018-07-06: 18:00:00 40 mg via INTRAVENOUS
  Filled 2018-07-06: qty 4

## 2018-07-06 MED ORDER — POTASSIUM CHLORIDE CRYS ER 20 MEQ PO TBCR
40.0000 meq | EXTENDED_RELEASE_TABLET | Freq: Three times a day (TID) | ORAL | Status: AC
  Administered 2018-07-06 (×2): 40 meq via ORAL
  Filled 2018-07-06 (×2): qty 2

## 2018-07-06 NOTE — Progress Notes (Signed)
NAME:  [name] [name], MRN:  [id number], DOB:  1950-05-01, LOS: 6 ADMISSION DATE:  06/30/2018, CONSULTATION DATE:  Jul 01, 2018 REFERRING MD:  Dr. Sloan Leiter, CHIEF COMPLAINT:  Dyspnea   Brief History   68 year old female with a past medical history significant for diabetes and obstructive sleep apnea was admitted 5/6 with acute respiratory failure with hypoxemia secondary to COVID-19.  Transferred to the ICU on 06/30/2018 for worsening hypoxemia and dyspnea.  Past Medical History  Obstructive sleep apnea Hypertension Diabetes mellitus Hypertension Hyperlipidemia GERD Depression Coronary artery disease  Significant Hospital Events   May 6 admitted May 7 moved to ICU for worsening hypoxemia and dyspnea May 8 > BIPAP started overnight for OSA, tolerated well May 9 Heated high flow started, tolerating well  Consults:  Pulmonary and critical care medicine  Procedures:    Significant Diagnostic Tests:    Micro Data:  SARS-CoV-2 positive prior to admission at outside facility. 5/6 blood culture >   Antimicrobials:/COVID Treatment  5/7 ceftriaxone >  5/7 azithro >   5/7 actemra/convalescent plasma 5/8 actemra  Interim history/subjective:   Tolerated prone positioning well  Objective   Blood pressure (!) 143/53, pulse 60, temperature 98.2 F (36.8 C), temperature source Oral, resp. rate 18, height 5\' 4"  (1.626 m), weight 112.5 kg, last menstrual period 11/25/2002, SpO2 94 %.    Vent Mode: BIPAP FiO2 (%):  [100 %] 100 % Set Rate:  [15 bmp] 15 bmp PEEP:  [8 cmH20] 8 cmH20   Intake/Output Summary (Last 24 hours) at 07/06/2018 0854 Last data filed at 07/06/2018 0600 Gross per 24 hour  Intake 703.03 ml  Output 2300 ml  Net -1596.97 ml   Filed Weights   06/30/18 1715 07/01/18 1415  Weight: 110 kg 112.5 kg   Examination:  General:  Comfortable appearing, NAD on HFNC HENT: Bandana/AT, PERRL, EOM-I and MMM PULM: Coarse BS diffusely CV: RRR, Nl S1/S2 and -M/R/G GI:  Soft, NT, ND and +BS MSK: Normal bulk and tone Neuro: Awake and interactive, moving all ext to command Skin: Intact  Resolved Hospital Problem list     Assessment & Plan:  Acute respiratory failure with hypoxemia due to COVID-19 pneumonia: worsening oxygenation Continue to hold in the ICU given O2 demand and concern for respiratory failure Prone positioning as tolerated, patient c/o abdominal pain when in prone positioning but really seems to help with oxygenation If needs BiPAP during the day or WOB is worse then will intubate and give a second dose of convalescent plasma after patient consent HFNC for sat of 88-92% BiPAP at night only for OSA Continue solumedrol, patient and family understand that there are no recommendations at this time for solumedrol but would decrease inflammation, they understand that side effects of solumedrol but that we are trying the best treatments we have with limited recommendation of such a novel disease Repeat CXR for now Lasix 40 q8 x2 doses K-dur 40 meq PO q8 x2 doses AM labs  Obstructive sleep apnea: Nightly BIPAP with servo vent with filters  Possible bacterial community-acquired pneumonia: Continue ceftriaxone and azithro for now Monitor culture  Hypertension: Tele monitoring Monitor hemodynamics in ICU environment Continue antihypertensives per home regimen  Diabetes mellitus type 2 Per [facility]  Best practice:  Diet: regular diet Pain/Anxiety/Delirium protocol (if indicated): n/a VAP protocol (if indicated): n/a DVT prophylaxis: lovenox 0.5mg /kg sub q bid GI prophylaxis: PPI Glucose control: SSI Mobility: to chair Code Status: full Family Communication: updated daughter 5/8 Disposition: remain in ICU  Labs   CBC: Recent Labs  Lab 07/01/18 0215  07/02/18 0500 07/03/18 0009 07/03/18 0540 07/04/18 0535 07/05/18 0535 07/06/18 0558  WBC 4.3  --  5.2  --  11.9* 16.3* 16.2* 15.8*  NEUTROABS 3.4  --  4.4  --  10.7* 14.7* 13.7*  --    HGB 11.1*   < > 11.8* 12.6 11.9* 12.8 12.8 12.8  HCT 34.6*   < > 36.6 37.0 36.8 39.4 40.6 39.9  MCV 91.8  --  89.9  --  89.5 90.2 91.6 91.1  PLT 155  --  205  --  322 364 385 322   < > = values in this interval not displayed.    Basic Metabolic Panel: Recent Labs  Lab 07/02/18 0500 07/03/18 0009 07/03/18 0540 07/04/18 0535 07/05/18 0535 07/06/18 0558  NA 137 137 140 142 146* 143  K 3.5 3.9 4.1 3.5 3.9 3.5  CL 101  --  101 101 105 99  CO2 23  --  26 26 29 31   GLUCOSE 253*  --  317* 315* 230* 234*  BUN 39*  --  47* 48* 45* 40*  CREATININE 1.09*  --  1.10* 1.09* 0.91 0.88  CALCIUM 8.2*  --  8.3* 8.5* 8.9 8.7*  MG 1.7  --  1.9 2.1 2.3 2.2  PHOS  --   --   --   --   --  4.1   GFR: Estimated Creatinine Clearance: 77.2 mL/min (by C-G formula based on SCr of 0.88 mg/dL). Recent Labs  Lab 06/30/18 1727 06/30/18 1746 06/30/18 1927  07/03/18 0540 07/04/18 0535 07/05/18 0535 07/06/18 0558  PROCALCITON  --  <0.10  --   --   --   --   --   --   WBC  --  4.1  --    < > 11.9* 16.3* 16.2* 15.8*  LATICACIDVEN 1.6  --  1.1  --   --   --   --   --    < > = values in this interval not displayed.    Liver Function Tests: Recent Labs  Lab 07/01/18 0215 07/02/18 0500 07/03/18 0540 07/04/18 0535 07/05/18 0535  AST 32 37 45* 19 64*  ALT 29 30 36 42 60*  ALKPHOS 35* 42 46 58 71  BILITOT 0.6 0.5 0.4 0.7 0.8  PROT 6.3* 7.5 6.9 7.3 7.5  ALBUMIN 2.9* 3.2* 3.0* 3.3* 3.5   Recent Labs  Lab 07/04/18 0535  LIPASE 35   No results for input(s): AMMONIA in the last 168 hours.  ABG    Component Value Date/Time   PHART 7.412 07/03/2018 0009   PCO2ART 35.2 07/03/2018 0009   PO2ART 45.0 (L) 07/03/2018 0009   HCO3 22.6 07/03/2018 0009   TCO2 24 07/03/2018 0009   ACIDBASEDEF 2.0 07/03/2018 0009   O2SAT 83.0 07/03/2018 0009     Coagulation Profile: No results for input(s): INR, PROTIME in the last 168 hours.  Cardiac Enzymes: Recent Labs  Lab 06/30/18 1808 07/01/18 0215  07/02/18 0500 07/03/18 0540 07/04/18 0535 07/05/18 0535  CKTOTAL  --  147 192 124 84 61  TROPONINI <0.03 <0.03  --   --   --   --    HbA1C: Hgb A1c MFr Bld  Date/Time Value Ref Range Status  02/01/2018 11:25 AM 6.7 (H) 4.8 - 5.6 % Final    Comment:             Prediabetes: 5.7 - 6.4  Diabetes: >6.4          Glycemic control for adults with diabetes: <7.0   10/06/2017 11:35 AM 6.7 (H) 4.8 - 5.6 % Final    Comment:             Prediabetes: 5.7 - 6.4          Diabetes: >6.4          Glycemic control for adults with diabetes: <7.0    CBG: Recent Labs  Lab 07/05/18 1212 07/05/18 1725 07/05/18 2128 07/05/18 2328 07/06/18 0739  GLUCAP 230* 246* 229* 221* 214*   The patient is critically ill with multiple organ systems failure and requires high complexity decision making for assessment and support, frequent evaluation and titration of therapies, application of advanced monitoring technologies and extensive interpretation of multiple databases.   Critical Care Time devoted to patient care services described in this note is  38  Minutes. This time reflects time of care of this signee Dr Jennet Maduro. This critical care time does not reflect procedure time, or teaching time or supervisory time of PA/NP/Med student/Med Resident etc but could involve care discussion time.  Rush Farmer, M.D. [facility] Pulmonary/Critical Care Medicine. Pager: [phone]. After hours pager: [phone].

## 2018-07-06 NOTE — Progress Notes (Signed)
Placed on BIPAP for HS use per order. No distress noted at this time.

## 2018-07-06 NOTE — Progress Notes (Signed)
[facility] PROGRESS NOTE    Progress Note  [name] [name]  JXB:[id number] DOB: [birth year] DOA: 06/30/2018 PCP: Vicenta Aly, FNP     Brief Narrative:   [name] [name] is an 68 y.o. female past medical history of essential hypertension, diabetes mellitus type 2, chronic diastolic heart failure obstructive sleep apnea tested positive for COVID-19 approximately 1 week ago into the ED she was found hypoxemic and respiratory failure transferred to [facility], post admission her hypoxemia worsened and subsequently transferred to the ICU on 07/01/2018.  Events: 07/01/2018 moved to the ICU due to hypoxemia tachypnea.  Micro: 06/30/2018 blood cultures have remained negative 06/30/2018 SARS-CoV-2 positive at outside facility.  Medications: 07/01/2018 IV Rocephin and azithro 07/01/2018 IV steroids 07/01/2018 Actemra 07/02/2018 convalescent plasma  Assessment/Plan:   Acute respiratory disease due to COVID-19 virus/SIRS due to COVID:: High flow nasal cannula at 35 L appreciate PCCM assistance. She was prone on 07/05/2018 Further management per CCM appreciate assistance. She is currently on IV Lasix with good diuresis, since admission she is about 2.0 L negative. Continue IV Lasix, she seems to be responding really well prone position as her saturations have improved. Her inflammatory markers are slowly rising COVID-19 Labs  Recent Labs    07/04/18 0535 07/05/18 0535 07/05/18 0548 07/06/18 0558  DDIMER 0.89* 1.57*  --  1.86*  FERRITIN  --   --  203  --   CRP 2.3* 1.1*  --   --     No results found for: SARSCOV2NAA   Chronic diastolic heart failure: Continue IV Lasix at current dose she continues to diurese fairly well. Monitor strict I's and O's closely limit her fluid intake. Monitor electrolytes and replete as needed.  Potassium is 3.5, will replete orally recheck in the morning, as she is on Lasix.  Essential hypertension: Bisoprolol has been DC'd due to  bradycardia, as well as her lisinopril. To allow for further diuresis.  Diabetes mellitus type 2: Her blood glucose is persistently above 200, will increase her long-acting to 60 units twice a day. As we take off her steroids we should decrease her long-acting insulin. She is on high-dose steroids  New mild abdominal pain: Her abdominal pain is now resolved.  Work-up was negative.  Mild hypovolemic hyper natremia: Resolved with diuresis. To new press on with IV Lasix fluid restrictions.  Obstructive sleep apnea  Morbid obesity with alveolar hypoventilation (HCC)   DVT prophylaxis: lovenox Family Communication:none Disposition Plan/Barrier to D/C: Keep in stepdown. Code Status:     Code Status Orders  (From admission, onward)         Start     Ordered   07/01/18 0040  Full code  Continuous     07/01/18 0040        Code Status History    Date Active Date Inactive Code Status Order ID Comments User Context   06/30/2018 2251 07/01/2018 0040 Full Code [id number]  Toy Baker, MD ED   11/29/2013 1958 11/30/2013 1340 Full Code [id number]  Adrian Prows, MD Inpatient   11/29/2013 1704 11/29/2013 1958 Full Code [id number]  Adrian Prows, MD ED   09/02/2013 1708 09/03/2013 1908 Full Code [id number]  Penelope Coop Inpatient   08/02/2012 1639 08/04/2012 1709 Full Code [id number]  Erlene Senters, PA-C Inpatient        IV Access:    Peripheral IV   Procedures and diagnostic studies:   Dg Abd 1 View  Result Date: 07/04/2018 CLINICAL DATA:  Abdominal  pain EXAM: ABDOMEN - 1 VIEW COMPARISON:  06/30/2018 chest x-ray FINDINGS: Bibasilar opacities with central air bronchograms in the lower lobes, worse on the right concerning for basilar pneumonia. Scattered air throughout the bowel without significant obstruction pattern, dilatation or ileus. Remote cholecystectomy. Degenerative changes of the spine. Right hip hardware partially imaged. Vascular calcifications noted. IMPRESSION:  Nonobstructive bowel gas pattern. Bibasilar airspace process. Electronically Signed   By: Jerilynn Mages.  Shick M.D.   On: 07/04/2018 10:30   Dg Chest Port 1 View  Result Date: 07/05/2018 CLINICAL DATA:  Hypoxemia. EXAM: PORTABLE CHEST 1 VIEW COMPARISON:  Radiographs of Jul 03, 2018. FINDINGS: Stable cardiomediastinal silhouette. No pneumothorax is noted. Mildly decreased bilateral lung opacities are noted suggesting improving pneumonia. Minimal pleural effusions may be present. Bony thorax is unremarkable. IMPRESSION: Mildly improved bilateral pneumonia. Electronically Signed   By: Marijo Conception M.D.   On: 07/05/2018 09:23     Medical Consultants:    None.  Anti-Infectives:   IV Rocephin and azithromycin  Subjective:    [name] [name] she relates her breathing is unchanged.  Objective:    Vitals:   07/06/18 0500 07/06/18 0600 07/06/18 0603 07/06/18 0700  BP: (!) 145/62  (!) 121/58 (!) 143/53  Pulse: (!) 54 61 (!) 58 (!) 53  Resp: (!) 21 16 19  (!) 23  Temp:      TempSrc:      SpO2: 93% 95% 96% 96%  Weight:      Height:        Intake/Output Summary (Last 24 hours) at 07/06/2018 0846 Last data filed at 07/06/2018 0600 Gross per 24 hour  Intake 703.03 ml  Output 2300 ml  Net -1596.97 ml   Filed Weights   06/30/18 1715 07/01/18 1415  Weight: 110 kg 112.5 kg    Exam: General exam: Comfortably in bed in no acute distress. Respiratory system: Good air movement and clear to auscultation. Cardiovascular system: Rate and rhythm with positive S1-S2. Gastrointestinal system: Bowel sounds soft nontender nondistended. Central nervous system: Awake alert and oriented x3 nonfocal.    Data Reviewed:    Labs: Basic Metabolic Panel: Recent Labs  Lab 07/02/18 0500 07/03/18 0009 07/03/18 0540 07/04/18 0535 07/05/18 0535 07/06/18 0558  NA 137 137 140 142 146* 143  K 3.5 3.9 4.1 3.5 3.9 3.5  CL 101  --  101 101 105 99  CO2 23  --  26 26 29 31   GLUCOSE 253*  --  317* 315* 230*  234*  BUN 39*  --  47* 48* 45* 40*  CREATININE 1.09*  --  1.10* 1.09* 0.91 0.88  CALCIUM 8.2*  --  8.3* 8.5* 8.9 8.7*  MG 1.7  --  1.9 2.1 2.3 2.2  PHOS  --   --   --   --   --  4.1   GFR Estimated Creatinine Clearance: 77.2 mL/min (by C-G formula based on SCr of 0.88 mg/dL). Liver Function Tests: Recent Labs  Lab 07/01/18 0215 07/02/18 0500 07/03/18 0540 07/04/18 0535 07/05/18 0535  AST 32 37 45* 19 64*  ALT 29 30 36 42 60*  ALKPHOS 35* 42 46 58 71  BILITOT 0.6 0.5 0.4 0.7 0.8  PROT 6.3* 7.5 6.9 7.3 7.5  ALBUMIN 2.9* 3.2* 3.0* 3.3* 3.5   Recent Labs  Lab 07/04/18 0535  LIPASE 35   No results for input(s): AMMONIA in the last 168 hours. Coagulation profile No results for input(s): INR, PROTIME in the last 168 hours. COVID-19  Labs  Recent Labs    07/04/18 0535 07/05/18 0535 07/05/18 0548 07/06/18 0558  DDIMER 0.89* 1.57*  --  1.86*  FERRITIN  --   --  203  --   CRP 2.3* 1.1*  --   --     No results found for: SARSCOV2NAA  CBC: Recent Labs  Lab 07/01/18 0215  07/02/18 0500 07/03/18 0009 07/03/18 0540 07/04/18 0535 07/05/18 0535 07/06/18 0558  WBC 4.3  --  5.2  --  11.9* 16.3* 16.2* 15.8*  NEUTROABS 3.4  --  4.4  --  10.7* 14.7* 13.7*  --   HGB 11.1*   < > 11.8* 12.6 11.9* 12.8 12.8 12.8  HCT 34.6*   < > 36.6 37.0 36.8 39.4 40.6 39.9  MCV 91.8  --  89.9  --  89.5 90.2 91.6 91.1  PLT 155  --  205  --  322 364 385 322   < > = values in this interval not displayed.   Cardiac Enzymes: Recent Labs  Lab 06/30/18 1808 07/01/18 0215 07/02/18 0500 07/03/18 0540 07/04/18 0535 07/05/18 0535  CKTOTAL  --  147 192 124 84 61  TROPONINI <0.03 <0.03  --   --   --   --    BNP (last 3 results) No results for input(s): PROBNP in the last 8760 hours. CBG: Recent Labs  Lab 07/05/18 1212 07/05/18 1725 07/05/18 2128 07/05/18 2328 07/06/18 0739  GLUCAP 230* 246* 229* 221* 214*   D-Dimer: Recent Labs    07/05/18 0535 07/06/18 0558  DDIMER 1.57* 1.86*    Hgb A1c: No results for input(s): HGBA1C in the last 72 hours. Lipid Profile: No results for input(s): CHOL, HDL, LDLCALC, TRIG, CHOLHDL, LDLDIRECT in the last 72 hours. Thyroid function studies: No results for input(s): TSH, T4TOTAL, T3FREE, THYROIDAB in the last 72 hours.  Invalid input(s): FREET3 Anemia work up: Recent Labs    07/05/18 0548  FERRITIN 203   Sepsis Labs: Recent Labs  Lab 06/30/18 1727 06/30/18 1746 06/30/18 1927  07/03/18 0540 07/04/18 0535 07/05/18 0535 07/06/18 0558  PROCALCITON  --  <0.10  --   --   --   --   --   --   WBC  --  4.1  --    < > 11.9* 16.3* 16.2* 15.8*  LATICACIDVEN 1.6  --  1.1  --   --   --   --   --    < > = values in this interval not displayed.   Microbiology Recent Results (from the past 240 hour(s))  Blood Culture (routine x 2)     Status: None   Collection Time: 06/30/18  5:46 PM  Result Value Ref Range Status   Specimen Description   Final    BLOOD LEFT ANTECUBITAL Performed at [facility] [address], [place], [place] [address]    Special Requests   Final    BOTTLES DRAWN AEROBIC AND ANAEROBIC Blood Culture results may not be optimal due to an excessive volume of blood received in culture bottles Performed at [facility] [address], [place], [place] [address]    Culture   Final    NO GROWTH 5 DAYS Performed at [facility], [place] [address] [place], [place] [address]    Report Status 07/05/2018 FINAL  Final  Blood Culture (routine x 2)     Status: None   Collection Time: 06/30/18  5:50 PM  Result Value Ref  Range Status   Specimen Description   Final    BLOOD RIGHT ANTECUBITAL Performed at [facility] [address] [place], [place] [address]    Special Requests   Final    BOTTLES DRAWN AEROBIC AND ANAEROBIC Blood Culture adequate volume Performed at [facility] [address] [place], [place] [address]    Culture    Final    NO GROWTH 5 DAYS Performed at [facility], [place] [address] [place], [place] [address]    Report Status 07/05/2018 FINAL  Final     Medications:   . amLODipine  10 mg Oral q morning - 10a  . atorvastatin  80 mg Oral QPM  . chlorhexidine  15 mL Mouth Rinse BID  . enoxaparin (LOVENOX) injection  55 mg Subcutaneous Q12H  . escitalopram  20 mg Oral QPM  . furosemide  40 mg Intravenous Q12H  . insulin aspart  0-15 Units Subcutaneous TID WC  . insulin aspart  0-5 Units Subcutaneous QHS  . insulin aspart  3 Units Subcutaneous TID WC  . insulin glargine  55 Units Subcutaneous BID  . mouth rinse  15 mL Mouth Rinse q12n4p  . methylPREDNISolone (SOLU-MEDROL) injection  60 mg Intravenous Q6H  . pantoprazole  40 mg Oral Daily  . sodium chloride flush  3 mL Intravenous Q12H  . sodium chloride flush  3 mL Intravenous Q12H  . vitamin C  500 mg Oral Daily  . zinc sulfate  220 mg Oral Daily   Continuous Infusions: . sodium chloride 10 mL/hr at 07/06/18 0408  . azithromycin (ZITHROMAX) 500 MG IVPB (Vial-Mate [employer]) Stopped (07/05/18 2310)  . cefTRIAXone (ROCEPHIN)  IV Stopped (07/05/18 2100)  . famotidine (PEPCID) IV 20 mg (07/06/18 0801)    LOS: 6 days   Charlynne Cousins  [facility]  07/06/2018, 8:46 AM

## 2018-07-06 NOTE — Plan of Care (Signed)
Discussed with patient plan of care for the evening, pain management and antibiotic medications with some teach back displayed. Patient requested something other Ambien for sleep will page MD.

## 2018-07-07 LAB — BASIC METABOLIC PANEL
Anion gap: 14 (ref 5–15)
BUN: 41 mg/dL — ABNORMAL HIGH (ref 8–23)
CO2: 30 mmol/L (ref 22–32)
Calcium: 8.8 mg/dL — ABNORMAL LOW (ref 8.9–10.3)
Chloride: 98 mmol/L (ref 98–111)
Creatinine, Ser: 0.94 mg/dL (ref 0.44–1.00)
GFR calc Af Amer: >60 mL/min (ref >60)
GFR calc non Af Amer: >60 mL/min (ref >60)
Glucose, Bld: 202 mg/dL — ABNORMAL HIGH (ref 70–99)
Potassium: 3.7 mmol/L (ref 3.5–5.1)
Sodium: 142 mmol/L (ref 135–145)

## 2018-07-07 LAB — C-REACTIVE PROTEIN: CRP: <0.8 mg/dL (ref <1.0)

## 2018-07-07 LAB — CBC
HCT: 38.7 % (ref 36.0–46.0)
Hemoglobin: 12.9 g/dL (ref 12.0–15.0)
MCH: 29.9 pg (ref 26.0–34.0)
MCHC: 33.3 g/dL (ref 30.0–36.0)
MCV: 89.6 fL (ref 80.0–100.0)
Platelets: 318 10*3/uL (ref 150–400)
RBC: 4.32 MIL/uL (ref 3.87–5.11)
RDW: 13.2 % (ref 11.5–15.5)
WBC: 17.1 10*3/uL — ABNORMAL HIGH (ref 4.0–10.5)
nRBC: 0 % (ref 0.0–0.2)

## 2018-07-07 LAB — PHOSPHORUS: Phosphorus: 3.6 mg/dL (ref 2.5–4.6)

## 2018-07-07 LAB — GLUCOSE, CAPILLARY
Glucose-Capillary: 174 mg/dL — ABNORMAL HIGH (ref 70–99)
Glucose-Capillary: 266 mg/dL — ABNORMAL HIGH (ref 70–99)
Glucose-Capillary: 292 mg/dL — ABNORMAL HIGH (ref 70–99)
Glucose-Capillary: 282 mg/dL — ABNORMAL HIGH (ref 70–99)

## 2018-07-07 LAB — D-DIMER, QUANTITATIVE: D-Dimer, Quant: 2.17 ug/mL-FEU — ABNORMAL HIGH (ref 0.00–0.50)

## 2018-07-07 LAB — FERRITIN: Ferritin: 197 ng/mL (ref 11–307)

## 2018-07-07 LAB — MAGNESIUM: Magnesium: 2.1 mg/dL (ref 1.7–2.4)

## 2018-07-07 MED ORDER — METHYLPREDNISOLONE SODIUM SUCC 125 MG IJ SOLR
60.0000 mg | Freq: Two times a day (BID) | INTRAMUSCULAR | Status: DC
  Administered 2018-07-07 – 2018-07-09 (×4): 60 mg via INTRAVENOUS
  Filled 2018-07-07 (×4): qty 2

## 2018-07-07 MED ORDER — INSULIN ASPART 100 UNIT/ML ~~LOC~~ SOLN
6.0000 [IU] | Freq: Three times a day (TID) | SUBCUTANEOUS | Status: DC
  Administered 2018-07-08 – 2018-07-09 (×5): 6 [IU] via SUBCUTANEOUS

## 2018-07-07 NOTE — Progress Notes (Signed)
[facility] PROGRESS NOTE    Progress Note  [name] [name]  HMC:[id number] DOB: 08/22/1950 DOA: 06/30/2018 PCP: Vicenta Aly, FNP     Brief Narrative:   [name] [name] is an 68 y.o. female past medical history of essential hypertension, diabetes mellitus type 2, chronic diastolic heart failure obstructive sleep apnea tested positive for COVID-19 approximately 1 week ago into the ED she was found hypoxemic and respiratory failure transferred to [facility], post admission her hypoxemia worsened and subsequently transferred to the ICU on 07/01/2018.  Events: 07/01/2018 moved to the ICU due to hypoxemia tachypnea.  Micro: 06/30/2018 blood cultures have remained negative 06/30/2018 SARS-CoV-2 positive at outside facility.  Medications: 07/01/2018 IV Rocephin and azithro 07/01/2018 IV steroids 07/01/2018 Actemra 07/02/2018 convalescent plasma  Assessment/Plan:   Acute respiratory disease due to COVID-19 virus/SIRS due to COVID:: High flow nasal cannula at 30 L appreciate PCCM assistance. She was prone on 07/05/2018 Further management per CCM appreciate assistance. Her inflammatory markers been stable.  She is currently on steroids.  She also received convalescent plasma as well as Actemra  COVID-19 Labs  Recent Labs    07/05/18 0535 07/05/18 0548 07/06/18 0558 07/07/18 0230  DDIMER 1.57*  --  1.86* 2.17*  FERRITIN  --  203 197 197  CRP 1.1*  --  <0.8 <0.8    No results found for: SARSCOV2NAA   Chronic diastolic heart failure: Currently appears euvolemic.  Review of her outpatient meds do not indicate that she is on chronic Lasix.  She is however on chlorthalidone.  Diuretics are currently on hold.  She is having good urine output with approximately 3 L of urine output yesterday.  Continue to monitor for now.  Essential hypertension: Currently on amlodipine.  Blood pressure stable.  Diabetes mellitus type 2: Remains hyperglycemic.  Continue basal insulin.   Increase meal coverage.  New mild abdominal pain: Her abdominal pain is now resolved.  Work-up was negative.  Obstructive sleep apnea  Morbid obesity with alveolar hypoventilation ([name])   DVT prophylaxis: lovenox Family Communication:none Disposition Plan/Barrier to D/C: Keep in ICU Code Status:     Code Status Orders  (From admission, onward)         Start     Ordered   07/01/18 0040  Full code  Continuous     07/01/18 0040        Code Status History    Date Active Date Inactive Code Status Order ID Comments User Context   06/30/2018 2251 07/01/2018 0040 Full Code [id number]  Toy Baker, MD ED   11/29/2013 1958 11/30/2013 1340 Full Code [id number]  Adrian Prows, MD Inpatient   11/29/2013 1704 11/29/2013 1958 Full Code [id number]  Adrian Prows, MD ED   09/02/2013 1708 09/03/2013 1908 Full Code [id number]  Penelope Coop Inpatient   08/02/2012 1639 08/04/2012 1709 Full Code [id number]  Erlene Senters, PA-C Inpatient        IV Access:    Peripheral IV   Procedures and diagnostic studies:   No results found.   Medical Consultants:    PCCM  Anti-Infectives:   IV Rocephin and azithromycin  Subjective:    [name] [name] feels that her breathing is doing better.  She is coughing.  She is seen sitting up in the chair  Objective:    Vitals:   07/07/18 1200 07/07/18 1300 07/07/18 1400 07/07/18 1615  BP: 129/60 (!) 154/51 (!) 149/51 (!) 138/48  Pulse: 63 64 60 (!) 58  Resp: (!) 22 (!) 23 11 (!) 26  Temp: 98.2 F (36.8 C)     TempSrc: Oral     SpO2: 92% 92% 93% 91%  Weight:      Height:        Intake/Output Summary (Last 24 hours) at 07/07/2018 1844 Last data filed at 07/07/2018 1300 Gross per 24 hour  Intake 1090.03 ml  Output 2750 ml  Net -1659.97 ml   Filed Weights   06/30/18 1715 07/01/18 1415  Weight: 110 kg 112.5 kg    Exam: General exam: Alert, awake, oriented x 3 Respiratory system: Fine crackles bilaterally. Respiratory effort  normal. Cardiovascular system:RRR. No murmurs, rubs, gallops. Gastrointestinal system: Abdomen is nondistended, soft and nontender. No organomegaly or masses felt. Normal bowel sounds heard. Central nervous system: Alert and oriented. No focal neurological deficits. Extremities: No C/C/E, +pedal pulses Skin: No rashes, lesions or ulcers Psychiatry: Judgement and insight appear normal. Mood & affect appropriate.      Data Reviewed:    Labs: Basic Metabolic Panel: Recent Labs  Lab 07/03/18 0540 07/04/18 0535 07/05/18 0535 07/06/18 0558 07/07/18 0230  NA 140 142 146* 143 142  K 4.1 3.5 3.9 3.5 3.7  CL 101 101 105 99 98  CO2 26 26 29 31 30   GLUCOSE 317* 315* 230* 234* 202*  BUN 47* 48* 45* 40* 41*  CREATININE 1.10* 1.09* 0.91 0.88 0.94  CALCIUM 8.3* 8.5* 8.9 8.7* 8.8*  MG 1.9 2.1 2.3 2.2 2.1  PHOS  --   --   --  4.1 3.6   GFR Estimated Creatinine Clearance: 72.3 mL/min (by C-G formula based on SCr of 0.94 mg/dL). Liver Function Tests: Recent Labs  Lab 07/01/18 0215 07/02/18 0500 07/03/18 0540 07/04/18 0535 07/05/18 0535  AST 32 37 45* 19 64*  ALT 29 30 36 42 60*  ALKPHOS 35* 42 46 58 71  BILITOT 0.6 0.5 0.4 0.7 0.8  PROT 6.3* 7.5 6.9 7.3 7.5  ALBUMIN 2.9* 3.2* 3.0* 3.3* 3.5   Recent Labs  Lab 07/04/18 0535  LIPASE 35   No results for input(s): AMMONIA in the last 168 hours. Coagulation profile No results for input(s): INR, PROTIME in the last 168 hours. COVID-19 Labs  Recent Labs    07/05/18 0535 07/05/18 0548 07/06/18 0558 07/07/18 0230  DDIMER 1.57*  --  1.86* 2.17*  FERRITIN  --  203 197 197  CRP 1.1*  --  <0.8 <0.8    No results found for: SARSCOV2NAA  CBC: Recent Labs  Lab 07/01/18 0215  07/02/18 0500  07/03/18 0540 07/04/18 0535 07/05/18 0535 07/06/18 0558 07/07/18 0230  WBC 4.3  --  5.2  --  11.9* 16.3* 16.2* 15.8* 17.1*  NEUTROABS 3.4  --  4.4  --  10.7* 14.7* 13.7*  --   --   HGB 11.1*   < > 11.8*   < > 11.9* 12.8 12.8 12.8  12.9  HCT 34.6*   < > 36.6   < > 36.8 39.4 40.6 39.9 38.7  MCV 91.8  --  89.9  --  89.5 90.2 91.6 91.1 89.6  PLT 155  --  205  --  322 364 385 322 318   < > = values in this interval not displayed.   Cardiac Enzymes: Recent Labs  Lab 07/01/18 0215 07/02/18 0500 07/03/18 0540 07/04/18 0535 07/05/18 0535  CKTOTAL 147 192 124 84 61  TROPONINI <0.03  --   --   --   --  BNP (last 3 results) No results for input(s): PROBNP in the last 8760 hours. CBG: Recent Labs  Lab 07/06/18 1744 07/06/18 1935 07/07/18 0808 07/07/18 1214 07/07/18 1740  GLUCAP 250* 270* 174* 266* 292*   D-Dimer: Recent Labs    07/06/18 0558 07/07/18 0230  DDIMER 1.86* 2.17*   Hgb A1c: No results for input(s): HGBA1C in the last 72 hours. Lipid Profile: No results for input(s): CHOL, HDL, LDLCALC, TRIG, CHOLHDL, LDLDIRECT in the last 72 hours. Thyroid function studies: No results for input(s): TSH, T4TOTAL, T3FREE, THYROIDAB in the last 72 hours.  Invalid input(s): FREET3 Anemia work up: Recent Labs    07/06/18 0558 07/07/18 0230  FERRITIN 197 197   Sepsis Labs: Recent Labs  Lab 06/30/18 1927  07/04/18 0535 07/05/18 0535 07/06/18 0558 07/07/18 0230  WBC  --    < > 16.3* 16.2* 15.8* 17.1*  LATICACIDVEN 1.1  --   --   --   --   --    < > = values in this interval not displayed.   Microbiology Recent Results (from the past 240 hour(s))  Blood Culture (routine x 2)     Status: None   Collection Time: 06/30/18  5:46 PM  Result Value Ref Range Status   Specimen Description   Final    BLOOD LEFT ANTECUBITAL Performed at [facility] [address], [place], [place] [address]    Special Requests   Final    BOTTLES DRAWN AEROBIC AND ANAEROBIC Blood Culture results may not be optimal due to an excessive volume of blood received in culture bottles Performed at [facility] [address] [place], [place] [address]    Culture   Final    NO GROWTH 5  DAYS Performed at [facility], [place] [address], [place], [place] [address]    Report Status 07/05/2018 FINAL  Final  Blood Culture (routine x 2)     Status: None   Collection Time: 06/30/18  5:50 PM  Result Value Ref Range Status   Specimen Description   Final    BLOOD RIGHT ANTECUBITAL Performed at [facility] [address], [place], [place] [address]    Special Requests   Final    BOTTLES DRAWN AEROBIC AND ANAEROBIC Blood Culture adequate volume Performed at [facility] [address], [place], [place] [address]    Culture   Final    NO GROWTH 5 DAYS Performed at [facility], [place] [address] [place], [place] [address]    Report Status 07/05/2018 FINAL  Final     Medications:   . amLODipine  10 mg Oral q morning - 10a  . atorvastatin  80 mg Oral QPM  . chlorhexidine  15 mL Mouth Rinse BID  . Chlorhexidine Gluconate Cloth  6 each Topical Q0600  . enoxaparin (LOVENOX) injection  55 mg Subcutaneous Q12H  . escitalopram  20 mg Oral QPM  . insulin aspart  0-15 Units Subcutaneous TID WC  . insulin aspart  0-5 Units Subcutaneous QHS  . insulin aspart  3 Units Subcutaneous TID WC  . insulin glargine  65 Units Subcutaneous BID  . mouth rinse  15 mL Mouth Rinse q12n4p  . methylPREDNISolone (SOLU-MEDROL) injection  60 mg Intravenous Q12H  . pantoprazole  40 mg Oral Daily  . sodium chloride flush  3 mL Intravenous Q12H  . sodium chloride flush  3 mL Intravenous Q12H  . vitamin C  500 mg Oral Daily  . zinc sulfate  220 mg Oral Daily   Continuous Infusions: . sodium chloride Stopped (07/06/18 2330)    LOS: 7 days   [employer]  [facility]  07/07/2018, 6:44 PM   Critical care: 88mins

## 2018-07-07 NOTE — Progress Notes (Signed)
NAME:  [name] [name], MRN:  [id number], DOB:  21-Jun-1950, LOS: 7 ADMISSION DATE:  06/30/2018, CONSULTATION DATE:  Jul 01, 2018 REFERRING MD:  Dr. Sloan Leiter, CHIEF COMPLAINT:  Dyspnea   Brief History   68 year old female with a past medical history significant for diabetes, obstructive sleep apnea admitted on May 6 with acute respiratory failure with hypoxemia secondary to COVID-19.  Transferred to the intensive care unit on Jul 01, 2018 for worsening hypoxemia and dyspnea.  Past Medical History  Obstructive sleep apnea Hypertension Diabetes mellitus Hypertension Hyperlipidemia GERD Depression Coronary artery disease  Significant Hospital Events   May 6 admitted May 7 moved to ICU for worsening hypoxemia and dyspnea May 8 > BIPAP started overnight for OSA, tolerated well May 9 Heated high flow started, tolerating well 5/11 started solumedrol  Consults:  PCCM  Procedures:    Significant Diagnostic Tests:    Micro Data:  SARS-CoV-2 positive prior to admission at outside facility. 5/6 blood culture >  Antimicrobials:  5/7 ceftriaxone > 5/7 azithro >   Interim history/subjective:  Breathing feels better this morning Started on steroids a few days ago BiPAP overnight, high flow nasal cannula this morning, FiO2 lower  Objective   Blood pressure (!) 137/59, pulse 60, temperature 98 F (36.7 C), temperature source Oral, resp. rate 18, height 5\' 4"  (1.626 m), weight 112.5 kg, last menstrual period 11/25/2002, SpO2 94 %.    Vent Mode: BIPAP FiO2 (%):  [60 %-100 %] 60 % Set Rate:  [15 bmp] 15 bmp PEEP:  [8 cmH20] 8 cmH20   Intake/Output Summary (Last 24 hours) at 07/07/2018 1154 Last data filed at 07/07/2018 1100 Gross per 24 hour  Intake 990.03 ml  Output 2550 ml  Net -1559.97 ml   Filed Weights   06/30/18 1715 07/01/18 1415  Weight: 110 kg 112.5 kg    Examination:  General:  Resting comfortably in bed, speaking in full sentences HENT: NCAT OP clear PULM:  CTA B, normal effort CV: RRR, no mgr GI: BS+, soft, nontender MSK: normal bulk and tone Neuro: awake, alert, no distress, MAEW   Resolved Hospital Problem list     Assessment & Plan:  Acute respiratory failure with hypoxemia due to COVID pneumonia: Oxygenation about the same Respiratory status seems improved today Monitor in the ICU closely In a chair today While in bed try lying in prone position as long as tolerated Administer O2 to maintain O2 saturation greater than 88% Monitor closely for respiratory failure such as nasal flaring, accessory muscle use Continue Solu-Medrol for now Continue diuresis for now  Obstructive sleep apnea: Nightly BiPAP with servo ventilator, filters on inspiratory and expiratory limbs  Possible bacterial community-acquired pneumonia: Continue ceftriaxone and azithromycin day course  Hypertension: Telemetry monitoring Monitor hemodynamics in ICU environment Holding benazepril for now  Diabetes mellitus type 2 Per primary team  Best practice:  Diet: regular diet Pain/Anxiety/Delirium protocol (if indicated): n/a VAP protocol (if indicated): n/a DVT prophylaxis: lovenox 0.5mg /kg sub q bid GI prophylaxis: PPI Glucose control: SSI Mobility: to chair Code Status: full Family Communication: will update family today Disposition:   Labs   CBC: Recent Labs  Lab 07/01/18 0215  07/02/18 0500  07/03/18 0540 07/04/18 0535 07/05/18 0535 07/06/18 0558 07/07/18 0230  WBC 4.3  --  5.2  --  11.9* 16.3* 16.2* 15.8* 17.1*  NEUTROABS 3.4  --  4.4  --  10.7* 14.7* 13.7*  --   --   HGB 11.1*   < > 11.8*   < >  11.9* 12.8 12.8 12.8 12.9  HCT 34.6*   < > 36.6   < > 36.8 39.4 40.6 39.9 38.7  MCV 91.8  --  89.9  --  89.5 90.2 91.6 91.1 89.6  PLT 155  --  205  --  322 364 385 322 318   < > = values in this interval not displayed.    Basic Metabolic Panel: Recent Labs  Lab 07/03/18 0540 07/04/18 0535 07/05/18 0535 07/06/18 0558 07/07/18 0230   NA 140 142 146* 143 142  K 4.1 3.5 3.9 3.5 3.7  CL 101 101 105 99 98  CO2 26 26 29 31 30   GLUCOSE 317* 315* 230* 234* 202*  BUN 47* 48* 45* 40* 41*  CREATININE 1.10* 1.09* 0.91 0.88 0.94  CALCIUM 8.3* 8.5* 8.9 8.7* 8.8*  MG 1.9 2.1 2.3 2.2 2.1  PHOS  --   --   --  4.1 3.6   GFR: Estimated Creatinine Clearance: 72.3 mL/min (by C-G formula based on SCr of 0.94 mg/dL). Recent Labs  Lab 06/30/18 1727 06/30/18 1746 06/30/18 1927  07/04/18 0535 07/05/18 0535 07/06/18 0558 07/07/18 0230  PROCALCITON  --  <0.10  --   --   --   --   --   --   WBC  --  4.1  --    < > 16.3* 16.2* 15.8* 17.1*  LATICACIDVEN 1.6  --  1.1  --   --   --   --   --    < > = values in this interval not displayed.    Liver Function Tests: Recent Labs  Lab 07/01/18 0215 07/02/18 0500 07/03/18 0540 07/04/18 0535 07/05/18 0535  AST 32 37 45* 19 64*  ALT 29 30 36 42 60*  ALKPHOS 35* 42 46 58 71  BILITOT 0.6 0.5 0.4 0.7 0.8  PROT 6.3* 7.5 6.9 7.3 7.5  ALBUMIN 2.9* 3.2* 3.0* 3.3* 3.5   Recent Labs  Lab 07/04/18 0535  LIPASE 35   No results for input(s): AMMONIA in the last 168 hours.  ABG    Component Value Date/Time   PHART 7.412 07/03/2018 0009   PCO2ART 35.2 07/03/2018 0009   PO2ART 45.0 (L) 07/03/2018 0009   HCO3 22.6 07/03/2018 0009   TCO2 24 07/03/2018 0009   ACIDBASEDEF 2.0 07/03/2018 0009   O2SAT 83.0 07/03/2018 0009     Coagulation Profile: No results for input(s): INR, PROTIME in the last 168 hours.  Cardiac Enzymes: Recent Labs  Lab 06/30/18 1808 07/01/18 0215 07/02/18 0500 07/03/18 0540 07/04/18 0535 07/05/18 0535  CKTOTAL  --  147 192 124 84 37  TROPONINI <0.03 <0.03  --   --   --   --     HbA1C: Hgb A1c MFr Bld  Date/Time Value Ref Range Status  02/01/2018 11:25 AM 6.7 (H) 4.8 - 5.6 % Final    Comment:             Prediabetes: 5.7 - 6.4          Diabetes: >6.4          Glycemic control for adults with diabetes: <7.0   10/06/2017 11:35 AM 6.7 (H) 4.8 - 5.6 %  Final    Comment:             Prediabetes: 5.7 - 6.4          Diabetes: >6.4          Glycemic control for adults with diabetes: <7.0  CBG: Recent Labs  Lab 07/06/18 0739 07/06/18 1131 07/06/18 1744 07/06/18 1935 07/07/18 0808  GLUCAP 214* 238* 250* 270* 174*       Critical care time: 31 minutes    Roselie Awkward, MD [facility] Pager: [phone] Cell: [phone] If no response, call [phone]

## 2018-07-07 NOTE — Progress Notes (Addendum)
Patient called her daughter and after she spoke with her, I also spoke with her. I updated her [name]) as well. We discussed the patient's progress, current oxygen requirements, and plan of care. Questions answered.    [name] [name] R

## 2018-07-07 NOTE — Progress Notes (Signed)
Inpatient Diabetes Program Recommendations  AACE/ADA: New Consensus Statement on Inpatient Glycemic Control (2015)  Target Ranges:  Prepandial:   less than 140 mg/dL      Peak postprandial:   less than 180 mg/dL (1-2 hours)      Critically ill patients:  140 - 180 mg/dL   Results for YUKTHA, KERCHNER (MRN [id number]) as of 07/07/2018 14:37  Ref. Range 07/06/2018 07:39 07/06/2018 11:31 07/06/2018 17:44 07/06/2018 19:35 07/07/2018 08:08 07/07/2018 12:14  Glucose-Capillary Latest Ref Range: 70 - 99 mg/dL 214 (H) Novolog 8 units given + Lantus 55 units 238 (H) Novolog 8 units given 250 (H) Novolog 8 units given 270 (H) Novolog 3 units given 174 (H) Novolog 6 units given 266 (H) Novolog 11 units given    Review of Glycemic Control  Diabetes history: DM 2  Current orders for Inpatient glycemic control:  Lantus 65 units BID Novolog 0-15 units tid Novolog 0-5 units qhs Novolog 3 units tid meal coverage  IV Solumedrol 60 mg Q12 hours  Inpatient Diabetes Program Recommendations:    Noted Lantus increased to 65 units BID. Still on IV Solumedrol. Glucose trends increase postprandially.   Consider increasing Novolog meal coverage to Novolog 6 units tid if patient consumes at least 50% of meals and glucose is at least 80 mg/dl.  Thanks,  Tama Headings RN, MSN, BC-ADM Inpatient Diabetes Coordinator Team Pager [phone] (8a-5p)

## 2018-07-08 LAB — BASIC METABOLIC PANEL
Anion gap: 9 (ref 5–15)
BUN: 39 mg/dL — ABNORMAL HIGH (ref 8–23)
CO2: 33 mmol/L — ABNORMAL HIGH (ref 22–32)
Calcium: 9.1 mg/dL (ref 8.9–10.3)
Chloride: 98 mmol/L (ref 98–111)
Creatinine, Ser: 0.8 mg/dL (ref 0.44–1.00)
GFR calc Af Amer: >60 mL/min (ref >60)
GFR calc non Af Amer: >60 mL/min (ref >60)
Glucose, Bld: 144 mg/dL — ABNORMAL HIGH (ref 70–99)
Potassium: 3.4 mmol/L — ABNORMAL LOW (ref 3.5–5.1)
Sodium: 140 mmol/L (ref 135–145)

## 2018-07-08 LAB — D-DIMER, QUANTITATIVE: D-Dimer, Quant: 1.97 ug/mL-FEU — ABNORMAL HIGH (ref 0.00–0.50)

## 2018-07-08 LAB — C-REACTIVE PROTEIN: CRP: <0.8 mg/dL (ref <1.0)

## 2018-07-08 LAB — GLUCOSE, CAPILLARY
Glucose-Capillary: 117 mg/dL — ABNORMAL HIGH (ref 70–99)
Glucose-Capillary: 154 mg/dL — ABNORMAL HIGH (ref 70–99)
Glucose-Capillary: 149 mg/dL — ABNORMAL HIGH (ref 70–99)
Glucose-Capillary: 253 mg/dL — ABNORMAL HIGH (ref 70–99)

## 2018-07-08 LAB — FERRITIN: Ferritin: 214 ng/mL (ref 11–307)

## 2018-07-08 MED ORDER — BENZONATATE 100 MG PO CAPS
200.0000 mg | ORAL_CAPSULE | Freq: Three times a day (TID) | ORAL | Status: DC | PRN
  Administered 2018-07-08 – 2018-07-11 (×9): 200 mg via ORAL
  Filled 2018-07-08 (×8): qty 2

## 2018-07-08 MED ORDER — HYDROCOD POLST-CPM POLST ER 10-8 MG/5ML PO SUER
5.0000 mL | Freq: Two times a day (BID) | ORAL | Status: DC | PRN
  Administered 2018-07-08 – 2018-07-12 (×4): 5 mL via ORAL
  Filled 2018-07-08 (×5): qty 5

## 2018-07-08 MED ORDER — POTASSIUM CHLORIDE CRYS ER 20 MEQ PO TBCR
40.0000 meq | EXTENDED_RELEASE_TABLET | Freq: Once | ORAL | Status: AC
  Administered 2018-07-08: 40 meq via ORAL
  Filled 2018-07-08: qty 2

## 2018-07-08 NOTE — Progress Notes (Signed)
NAME:  [name] [name], MRN:  [id number], DOB:  Mar 09, 1950, LOS: 8 ADMISSION DATE:  06/30/2018, CONSULTATION DATE:  Jul 01, 2018 REFERRING MD:  Dr. Sloan Leiter, CHIEF COMPLAINT:  Dyspnea   Brief History   68 year old female with a past medical history significant for diabetes, obstructive sleep apnea admitted on May 6 with acute respiratory failure with hypoxemia secondary to COVID-19.  Transferred to the intensive care unit on Jul 01, 2018 for worsening hypoxemia and dyspnea.  Past Medical History  Obstructive sleep apnea Hypertension Diabetes mellitus Hypertension Hyperlipidemia GERD Depression Coronary artery disease  Significant Hospital Events   May 6 admitted May 7 moved to ICU for worsening hypoxemia and dyspnea May 8 > BIPAP started overnight for OSA, tolerated well May 9 Heated high flow started, tolerating well 5/11 started solumedrol  Consults:  PCCM  Procedures:    Significant Diagnostic Tests:    Micro Data:  SARS-CoV-2 positive prior to admission at outside facility. 5/6 blood culture >  Antimicrobials:  5/7 ceftriaxone >5/12 5/7 azithro > 5/12  Interim history/subjective:  Breathing feels better Oxygenation improving  Objective   Blood pressure (!) 142/56, pulse 63, temperature 98.3 F (36.8 C), temperature source Axillary, resp. rate (!) 22, height 5\' 4"  (1.626 m), weight 112.5 kg, last menstrual period 11/25/2002, SpO2 94 %.    Vent Mode: BIPAP FiO2 (%):  [50 %-60 %] 50 % Set Rate:  [15 bmp] 15 bmp   Intake/Output Summary (Last 24 hours) at 07/08/2018 1643 Last data filed at 07/08/2018 1300 Gross per 24 hour  Intake 936 ml  Output 300 ml  Net 636 ml   Filed Weights   06/30/18 1715 07/01/18 1415  Weight: 110 kg 112.5 kg    Examination:  General:  Resting comfortably in chair HENT: NCAT OP clear PULM: CTA B, normal effort CV: RRR, no mgr GI: BS+, soft, nontender MSK: normal bulk and tone Neuro: awake, alert, no distress, MAEW     Resolved Hospital Problem list     Assessment & Plan:  Acute respiratory failure with hypoxemia due to COVID pneumonia: Oxygenation about the same Respiratory status seems improved today Continue ICU monitoring overnight In chair Work with PT Administer O2 to maintain O2 saturation > 88% Continue solumedrol Hold diuresis  Obstructive sleep apnea: Nightly BIPAP with servo ventilator, filters on inspiratory and expiratory limbs  Possible bacterial community-acquired pneumonia: Monitor off of antibiotics  Hypertension: Tele Monitor hemodynamics  Diabetes mellitus type 2 Per primary team  Best practice:  Diet: regular diet Pain/Anxiety/Delirium protocol (if indicated): n/a VAP protocol (if indicated): n/a DVT prophylaxis: lovenox 0.5mg /kg sub q bid GI prophylaxis: PPI Glucose control: SSI Mobility: to chair Code Status: full Family Communication: will update family today Disposition:   Labs   CBC: Recent Labs  Lab 07/02/18 0500  07/03/18 0540 07/04/18 0535 07/05/18 0535 07/06/18 0558 07/07/18 0230  WBC 5.2  --  11.9* 16.3* 16.2* 15.8* 17.1*  NEUTROABS 4.4  --  10.7* 14.7* 13.7*  --   --   HGB 11.8*   < > 11.9* 12.8 12.8 12.8 12.9  HCT 36.6   < > 36.8 39.4 40.6 39.9 38.7  MCV 89.9  --  89.5 90.2 91.6 91.1 89.6  PLT 205  --  322 364 385 322 318   < > = values in this interval not displayed.    Basic Metabolic Panel: Recent Labs  Lab 07/03/18 0540 07/04/18 0535 07/05/18 0535 07/06/18 0558 07/07/18 0230 07/08/18 0500  NA 140 142  146* 143 142 140  K 4.1 3.5 3.9 3.5 3.7 3.4*  CL 101 101 105 99 98 98  CO2 26 26 29 31 30  33*  GLUCOSE 317* 315* 230* 234* 202* 144*  BUN 47* 48* 45* 40* 41* 39*  CREATININE 1.10* 1.09* 0.91 0.88 0.94 0.80  CALCIUM 8.3* 8.5* 8.9 8.7* 8.8* 9.1  MG 1.9 2.1 2.3 2.2 2.1  --   PHOS  --   --   --  4.1 3.6  --    GFR: Estimated Creatinine Clearance: 85 mL/min (by C-G formula based on SCr of 0.8 mg/dL). Recent Labs  Lab  07/04/18 0535 07/05/18 0535 07/06/18 0558 07/07/18 0230  WBC 16.3* 16.2* 15.8* 17.1*    Liver Function Tests: Recent Labs  Lab 07/02/18 0500 07/03/18 0540 07/04/18 0535 07/05/18 0535  AST 37 45* 19 64*  ALT 30 36 42 60*  ALKPHOS 42 46 58 71  BILITOT 0.5 0.4 0.7 0.8  PROT 7.5 6.9 7.3 7.5  ALBUMIN 3.2* 3.0* 3.3* 3.5   Recent Labs  Lab 07/04/18 0535  LIPASE 35   No results for input(s): AMMONIA in the last 168 hours.  ABG    Component Value Date/Time   PHART 7.412 07/03/2018 0009   PCO2ART 35.2 07/03/2018 0009   PO2ART 45.0 (L) 07/03/2018 0009   HCO3 22.6 07/03/2018 0009   TCO2 24 07/03/2018 0009   ACIDBASEDEF 2.0 07/03/2018 0009   O2SAT 83.0 07/03/2018 0009     Coagulation Profile: No results for input(s): INR, PROTIME in the last 168 hours.  Cardiac Enzymes: Recent Labs  Lab 07/02/18 0500 07/03/18 0540 07/04/18 0535 07/05/18 0535  CKTOTAL 192 124 84 61    HbA1C: Hgb A1c MFr Bld  Date/Time Value Ref Range Status  02/01/2018 11:25 AM 6.7 (H) 4.8 - 5.6 % Final    Comment:             Prediabetes: 5.7 - 6.4          Diabetes: >6.4          Glycemic control for adults with diabetes: <7.0   10/06/2017 11:35 AM 6.7 (H) 4.8 - 5.6 % Final    Comment:             Prediabetes: 5.7 - 6.4          Diabetes: >6.4          Glycemic control for adults with diabetes: <7.0     CBG: Recent Labs  Lab 07/07/18 1214 07/07/18 1740 07/07/18 2057 07/08/18 0741 07/08/18 1129  GLUCAP 266* 292* 282* 117* 154*       Critical care time: n/a   Roselie Awkward, MD [facility] [facility] Pager: [phone] Cell: [phone] If no response, call [phone]

## 2018-07-08 NOTE — Progress Notes (Signed)
[facility] PROGRESS NOTE    Progress Note  [name] [name]  [id number]:[id number] DOB: [birth year] DOA: 06/30/2018 PCP: Vicenta Aly, FNP     Brief Narrative:   [name] [name] is an 68 y.o. female past medical history of essential hypertension, diabetes mellitus type 2, chronic diastolic heart failure obstructive sleep apnea tested positive for COVID-19 approximately 1 week ago into the ED she was found hypoxemic and respiratory failure transferred to [facility], post admission her hypoxemia worsened and subsequently transferred to the ICU on 07/01/2018.  Events: 07/01/2018 moved to the ICU due to hypoxemia tachypnea.  Micro: 06/30/2018 blood cultures have remained negative 06/30/2018 SARS-CoV-2 positive at outside facility.  Medications: 07/01/2018 IV Rocephin and azithro 07/01/2018 IV steroids 07/01/2018 Actemra 07/02/2018 convalescent plasma  Assessment/Plan:   Acute respiratory disease due to COVID-19 virus/SIRS due to COVID:: High flow nasal cannula at 15 L appreciate PCCM assistance. She was prone on 07/05/2018 Further management per CCM appreciate assistance. Her inflammatory markers been stable.  She is currently on steroids.  She also received convalescent plasma as well as Actemra.  She is slowly improving  COVID-19 Labs  Recent Labs    07/06/18 0558 07/07/18 0230 07/08/18 0500  DDIMER 1.86* 2.17* 1.97*  FERRITIN 197 197 214  CRP <0.8 <0.8 <0.8    No results found for: SARSCOV2NAA   Chronic diastolic heart failure: Currently appears euvolemic.  Review of her outpatient meds do not indicate that she is on chronic Lasix.  She is however on chlorthalidone.  Diuretics are currently on hold.  Volume status appears to be stable.  Continue to hold Lasix for now.  Essential hypertension: Currently on amlodipine.  Blood pressure stable.  Diabetes mellitus type 2: Remains hyperglycemic.  Continue basal insulin.  Increase meal coverage.  New mild abdominal pain:  Her abdominal pain is now resolved.  Work-up was negative.  Obstructive sleep apnea  Morbid obesity with alveolar hypoventilation ([place])   DVT prophylaxis: lovenox Family Communication:none Disposition Plan/Barrier to D/C: Keep in ICU Code Status:     Code Status Orders  (From admission, onward)         Start     Ordered   07/01/18 0040  Full code  Continuous     07/01/18 0040        Code Status History    Date Active Date Inactive Code Status Order ID Comments User Context   06/30/2018 2251 07/01/2018 0040 Full Code [id number]  Toy Baker, MD ED   11/29/2013 1958 11/30/2013 1340 Full Code [id number]  Adrian Prows, MD Inpatient   11/29/2013 1704 11/29/2013 1958 Full Code [id number]  Adrian Prows, MD ED   09/02/2013 1708 09/03/2013 1908 Full Code [id number]  Penelope Coop Inpatient   08/02/2012 1639 08/04/2012 1709 Full Code [id number]  Erlene Senters, PA-C Inpatient        IV Access:    Peripheral IV   Procedures and diagnostic studies:   No results found.   Medical Consultants:    PCCM  Anti-Infectives:   IV Rocephin and azithromycin  Subjective:    [name] [name] feeling better today.  Continues to cough.  Shortness of breath improving Objective:    Vitals:   07/08/18 1606 07/08/18 1700 07/08/18 1800 07/08/18 1900  BP:  (!) 143/58 (!) 144/54 (!) 146/56  Pulse:  60 65 64  Resp:  (!) 21 20 19   Temp: 98.3 F (36.8 C)     TempSrc: Axillary     SpO2:  94% 94% 93%  Weight:      Height:        Intake/Output Summary (Last 24 hours) at 07/08/2018 1915 Last data filed at 07/08/2018 1859 Gross per 24 hour  Intake 1006 ml  Output 800 ml  Net 206 ml   Filed Weights   06/30/18 1715 07/01/18 1415  Weight: 110 kg 112.5 kg    Exam: General exam: Alert, awake, oriented x 3 Respiratory system: CTA B. Respiratory effort normal. Cardiovascular system:RRR. No murmurs, rubs, gallops. Gastrointestinal system: Abdomen is nondistended, soft and  nontender. No organomegaly or masses felt. Normal bowel sounds heard. Central nervous system: Alert and oriented. No focal neurological deficits. Extremities: No C/C/E, +pedal pulses Skin: No rashes, lesions or ulcers Psychiatry: Judgement and insight appear normal. Mood & affect appropriate.    Data Reviewed:    Labs: Basic Metabolic Panel: Recent Labs  Lab 07/03/18 0540 07/04/18 0535 07/05/18 0535 07/06/18 0558 07/07/18 0230 07/08/18 0500  NA 140 142 146* 143 142 140  K 4.1 3.5 3.9 3.5 3.7 3.4*  CL 101 101 105 99 98 98  CO2 26 26 29 31 30  33*  GLUCOSE 317* 315* 230* 234* 202* 144*  BUN 47* 48* 45* 40* 41* 39*  CREATININE 1.10* 1.09* 0.91 0.88 0.94 0.80  CALCIUM 8.3* 8.5* 8.9 8.7* 8.8* 9.1  MG 1.9 2.1 2.3 2.2 2.1  --   PHOS  --   --   --  4.1 3.6  --    GFR Estimated Creatinine Clearance: 85 mL/min (by C-G formula based on SCr of 0.8 mg/dL). Liver Function Tests: Recent Labs  Lab 07/02/18 0500 07/03/18 0540 07/04/18 0535 07/05/18 0535  AST 37 45* 19 64*  ALT 30 36 42 60*  ALKPHOS 42 46 58 71  BILITOT 0.5 0.4 0.7 0.8  PROT 7.5 6.9 7.3 7.5  ALBUMIN 3.2* 3.0* 3.3* 3.5   Recent Labs  Lab 07/04/18 0535  LIPASE 35   No results for input(s): AMMONIA in the last 168 hours. Coagulation profile No results for input(s): INR, PROTIME in the last 168 hours. COVID-19 Labs  Recent Labs    07/06/18 0558 07/07/18 0230 07/08/18 0500  DDIMER 1.86* 2.17* 1.97*  FERRITIN 197 197 214  CRP <0.8 <0.8 <0.8    No results found for: SARSCOV2NAA  CBC: Recent Labs  Lab 07/02/18 0500  07/03/18 0540 07/04/18 0535 07/05/18 0535 07/06/18 0558 07/07/18 0230  WBC 5.2  --  11.9* 16.3* 16.2* 15.8* 17.1*  NEUTROABS 4.4  --  10.7* 14.7* 13.7*  --   --   HGB 11.8*   < > 11.9* 12.8 12.8 12.8 12.9  HCT 36.6   < > 36.8 39.4 40.6 39.9 38.7  MCV 89.9  --  89.5 90.2 91.6 91.1 89.6  PLT 205  --  322 364 385 322 318   < > = values in this interval not displayed.   Cardiac  Enzymes: Recent Labs  Lab 07/02/18 0500 07/03/18 0540 07/04/18 0535 07/05/18 0535  CKTOTAL 192 124 84 61   BNP (last 3 results) No results for input(s): PROBNP in the last 8760 hours. CBG: Recent Labs  Lab 07/07/18 1740 07/07/18 2057 07/08/18 0741 07/08/18 1129 07/08/18 1717  GLUCAP 292* 282* 117* 154* 149*   D-Dimer: Recent Labs    07/07/18 0230 07/08/18 0500  DDIMER 2.17* 1.97*   Hgb A1c: No results for input(s): HGBA1C in the last 72 hours. Lipid Profile: No results for input(s): CHOL, HDL, LDLCALC, TRIG, CHOLHDL,  LDLDIRECT in the last 72 hours. Thyroid function studies: No results for input(s): TSH, T4TOTAL, T3FREE, THYROIDAB in the last 72 hours.  Invalid input(s): FREET3 Anemia work up: Recent Labs    07/07/18 0230 07/08/18 0500  FERRITIN 197 214   Sepsis Labs: Recent Labs  Lab 07/04/18 0535 07/05/18 0535 07/06/18 0558 07/07/18 0230  WBC 16.3* 16.2* 15.8* 17.1*   Microbiology Recent Results (from the past 240 hour(s))  Blood Culture (routine x 2)     Status: None   Collection Time: 06/30/18  5:46 PM  Result Value Ref Range Status   Specimen Description   Final    BLOOD LEFT ANTECUBITAL Performed at [facility], [place] [address], [place], [place] [address]    Special Requests   Final    BOTTLES DRAWN AEROBIC AND ANAEROBIC Blood Culture results may not be optimal due to an excessive volume of blood received in culture bottles Performed at [place] [address] [place], [place] [address]    Culture   Final    NO GROWTH 5 DAYS Performed at [facility], [place] [address] [place], [place] [address]    Report Status 07/05/2018 FINAL  Final  Blood Culture (routine x 2)     Status: None   Collection Time: 06/30/18  5:50 PM  Result Value Ref Range Status   Specimen Description   Final    BLOOD RIGHT ANTECUBITAL Performed at [place] [address], [place], [place]  [address]    Special Requests   Final    BOTTLES DRAWN AEROBIC AND ANAEROBIC Blood Culture adequate volume Performed at [place] [address] [place], [place] [address]    Culture   Final    NO GROWTH 5 DAYS Performed at [facility], [place] [address] [place], [place] [address]    Report Status 07/05/2018 FINAL  Final     Medications:   . amLODipine  10 mg Oral q morning - 10a  . atorvastatin  80 mg Oral QPM  . chlorhexidine  15 mL Mouth Rinse BID  . Chlorhexidine Gluconate Cloth  6 each Topical Q0600  . enoxaparin (LOVENOX) injection  55 mg Subcutaneous Q12H  . escitalopram  20 mg Oral QPM  . insulin aspart  0-15 Units Subcutaneous TID WC  . insulin aspart  0-5 Units Subcutaneous QHS  . insulin aspart  6 Units Subcutaneous TID WC  . insulin glargine  65 Units Subcutaneous BID  . mouth rinse  15 mL Mouth Rinse q12n4p  . methylPREDNISolone (SOLU-MEDROL) injection  60 mg Intravenous Q12H  . pantoprazole  40 mg Oral Daily  . sodium chloride flush  3 mL Intravenous Q12H  . sodium chloride flush  3 mL Intravenous Q12H  . vitamin C  500 mg Oral Daily  . zinc sulfate  220 mg Oral Daily   Continuous Infusions: . sodium chloride Stopped (07/06/18 2330)    LOS: 8 days   [employer]  [facility]  07/08/2018, 7:15 PM   Critical care: 52mins

## 2018-07-08 NOTE — Progress Notes (Signed)
Daughter updated at bedside. Plan of care for the day discussed. Will continue to monitor.

## 2018-07-08 NOTE — Plan of Care (Signed)
Discussed with patient plan of care for the evening, pain management and sleeping medication with some teach back displayed

## 2018-07-09 LAB — BASIC METABOLIC PANEL
Anion gap: 11 (ref 5–15)
BUN: 33 mg/dL — ABNORMAL HIGH (ref 8–23)
CO2: 29 mmol/L (ref 22–32)
Calcium: 8.8 mg/dL — ABNORMAL LOW (ref 8.9–10.3)
Chloride: 98 mmol/L (ref 98–111)
Creatinine, Ser: 0.8 mg/dL (ref 0.44–1.00)
GFR calc Af Amer: >60 mL/min (ref >60)
GFR calc non Af Amer: >60 mL/min (ref >60)
Glucose, Bld: 134 mg/dL — ABNORMAL HIGH (ref 70–99)
Potassium: 3.7 mmol/L (ref 3.5–5.1)
Sodium: 138 mmol/L (ref 135–145)

## 2018-07-09 LAB — CBC
HCT: 41.4 % (ref 36.0–46.0)
Hemoglobin: 13.3 g/dL (ref 12.0–15.0)
MCH: 28.7 pg (ref 26.0–34.0)
MCHC: 32.1 g/dL (ref 30.0–36.0)
MCV: 89.2 fL (ref 80.0–100.0)
Platelets: 334 10*3/uL (ref 150–400)
RBC: 4.64 MIL/uL (ref 3.87–5.11)
RDW: 13 % (ref 11.5–15.5)
WBC: 15.7 10*3/uL — ABNORMAL HIGH (ref 4.0–10.5)
nRBC: 0 % (ref 0.0–0.2)

## 2018-07-09 LAB — GLUCOSE, CAPILLARY
Glucose-Capillary: 71 mg/dL (ref 70–99)
Glucose-Capillary: 220 mg/dL — ABNORMAL HIGH (ref 70–99)
Glucose-Capillary: 261 mg/dL — ABNORMAL HIGH (ref 70–99)
Glucose-Capillary: 238 mg/dL — ABNORMAL HIGH (ref 70–99)

## 2018-07-09 LAB — FERRITIN: Ferritin: 240 ng/mL (ref 11–307)

## 2018-07-09 LAB — D-DIMER, QUANTITATIVE: D-Dimer, Quant: 1.92 ug/mL-FEU — ABNORMAL HIGH (ref 0.00–0.50)

## 2018-07-09 LAB — C-REACTIVE PROTEIN: CRP: <0.8 mg/dL (ref <1.0)

## 2018-07-09 MED ORDER — INSULIN GLARGINE 100 UNIT/ML ~~LOC~~ SOLN
50.0000 [IU] | Freq: Two times a day (BID) | SUBCUTANEOUS | Status: DC
  Administered 2018-07-09 – 2018-07-13 (×7): 50 [IU] via SUBCUTANEOUS
  Filled 2018-07-09 (×10): qty 0.5

## 2018-07-09 MED ORDER — INSULIN GLARGINE 100 UNIT/ML ~~LOC~~ SOLN
30.0000 [IU] | Freq: Once | SUBCUTANEOUS | Status: AC
  Filled 2018-07-09: qty 0.3

## 2018-07-09 MED ORDER — NAPHAZOLINE-GLYCERIN 0.012-0.2 % OP SOLN: 1.0000 [drp] | OPHTHALMIC | Status: DC | PRN

## 2018-07-09 MED ORDER — POLYVINYL ALCOHOL 1.4 % OP SOLN
1.0000 [drp] | OPHTHALMIC | Status: DC | PRN
  Filled 2018-07-09: qty 15

## 2018-07-09 MED ORDER — INSULIN ASPART 100 UNIT/ML ~~LOC~~ SOLN
9.0000 [IU] | Freq: Three times a day (TID) | SUBCUTANEOUS | Status: DC
  Administered 2018-07-10: 9 [IU] via SUBCUTANEOUS

## 2018-07-09 NOTE — Progress Notes (Signed)
[facility] PROGRESS NOTE    Progress Note  [name] [name]  ZWC:[id number] DOB: [birth year] DOA: 06/30/2018 PCP: Vicenta Aly, FNP     Brief Narrative:   [name] [name] is an 68 y.o. female past medical history of essential hypertension, diabetes mellitus type 2, chronic diastolic heart failure obstructive sleep apnea tested positive for COVID-19 approximately 1 week ago into the ED she was found hypoxemic and respiratory failure transferred to [facility], post admission her hypoxemia worsened and subsequently transferred to the ICU on 07/01/2018.  Events: 07/01/2018 moved to the ICU due to hypoxemia tachypnea.  Micro: 06/30/2018 blood cultures have remained negative 06/30/2018 SARS-CoV-2 positive at outside facility.  Medications: 07/01/2018-5/14 IV Rocephin and azithro 07/01/2018-5/15 IV steroids 07/01/2018 Actemra 07/02/2018 convalescent plasma  Assessment/Plan:   Acute respiratory disease due to COVID-19 virus/SIRS due to COVID:: High flow nasal cannula at 10 L appreciate PCCM assistance. She was prone on 07/05/2018 Further management per CCM appreciate assistance. Her inflammatory markers been stable.  She is completed 7 days of intravenous steroids.  Will discontinue further steroids..  She also received convalescent plasma as well as Actemra.  She is slowly improving  COVID-19 Labs  Recent Labs    07/07/18 0230 07/08/18 0500 07/09/18 0457 07/09/18 0458  DDIMER 2.17* 1.97* 1.92*  --   FERRITIN 197 214  --  240  CRP <0.8 <0.8  --  <0.8    No results found for: SARSCOV2NAA   Chronic diastolic heart failure: Currently appears euvolemic.  Review of her outpatient meds do not indicate that she is on chronic Lasix.  She is however on chlorthalidone.  Diuretics are currently on hold.  Volume status appears to be stable.  Continue to hold Lasix for now.  Essential hypertension: Currently on amlodipine.  Blood pressure stable.  Diabetes mellitus type 2:  Remains hyperglycemic.  Continue basal insulin.  Increase meal coverage.  New mild abdominal pain: Her abdominal pain is now resolved.  Work-up was negative.  Obstructive sleep apnea  Morbid obesity with alveolar hypoventilation ([place])   DVT prophylaxis: lovenox Family Communication: CCM discussed with patient's daughter Disposition Plan/Barrier to D/C: Transfer to progressive care Code Status:     Code Status Orders  (From admission, onward)         Start     Ordered   07/01/18 0040  Full code  Continuous     07/01/18 0040        Code Status History    Date Active Date Inactive Code Status Order ID Comments User Context   06/30/2018 2251 07/01/2018 0040 Full Code [id number]  Toy Baker, MD ED   11/29/2013 1958 11/30/2013 1340 Full Code [id number]  Adrian Prows, MD Inpatient   11/29/2013 1704 11/29/2013 1958 Full Code [id number]  Adrian Prows, MD ED   09/02/2013 1708 09/03/2013 1908 Full Code [id number]  Penelope Coop Inpatient   08/02/2012 1639 08/04/2012 1709 Full Code [id number]  Erlene Senters, PA-C Inpatient        IV Access:    Peripheral IV   Procedures and diagnostic studies:   No results found.   Medical Consultants:    PCCM  Anti-Infectives:   IV Rocephin and azithromycin  Subjective:    [name] [name] she has had some coughing episodes, but overall is feeling better. Objective:    Vitals:   07/09/18 1302 07/09/18 1513 07/09/18 1600 07/09/18 1800  BP: 101/68 (!) 159/77  (!) 148/68  Pulse: 73 75  Resp: 17 20    Temp:   98 F (36.7 C)   TempSrc:   Oral   SpO2: 94% 91%    Weight:      Height:        Intake/Output Summary (Last 24 hours) at 07/09/2018 1907 Last data filed at 07/09/2018 1800 Gross per 24 hour  Intake 1100 ml  Output 1450 ml  Net -350 ml   Filed Weights   06/30/18 1715 07/01/18 1415  Weight: 110 kg 112.5 kg    Exam: General exam: Alert, awake, oriented x 3 Respiratory system: Clear to auscultation.  Respiratory effort normal. Cardiovascular system:RRR. No murmurs, rubs, gallops. Gastrointestinal system: Abdomen is nondistended, soft and nontender. No organomegaly or masses felt. Normal bowel sounds heard. Central nervous system: Alert and oriented. No focal neurological deficits. Extremities: No C/C/E, +pedal pulses Skin: No rashes, lesions or ulcers Psychiatry: Judgement and insight appear normal. Mood & affect appropriate.     Data Reviewed:    Labs: Basic Metabolic Panel: Recent Labs  Lab 07/03/18 0540 07/04/18 0535 07/05/18 0535 07/06/18 0558 07/07/18 0230 07/08/18 0500 07/09/18 0457  NA 140 142 146* 143 142 140 138  K 4.1 3.5 3.9 3.5 3.7 3.4* 3.7  CL 101 101 105 99 98 98 98  CO2 26 26 29 31 30  33* 29  GLUCOSE 317* 315* 230* 234* 202* 144* 134*  BUN 47* 48* 45* 40* 41* 39* 33*  CREATININE 1.10* 1.09* 0.91 0.88 0.94 0.80 0.80  CALCIUM 8.3* 8.5* 8.9 8.7* 8.8* 9.1 8.8*  MG 1.9 2.1 2.3 2.2 2.1  --   --   PHOS  --   --   --  4.1 3.6  --   --    GFR Estimated Creatinine Clearance: 85 mL/min (by C-G formula based on SCr of 0.8 mg/dL). Liver Function Tests: Recent Labs  Lab 07/03/18 0540 07/04/18 0535 07/05/18 0535  AST 45* 19 64*  ALT 36 42 60*  ALKPHOS 46 58 71  BILITOT 0.4 0.7 0.8  PROT 6.9 7.3 7.5  ALBUMIN 3.0* 3.3* 3.5   Recent Labs  Lab 07/04/18 0535  LIPASE 35   No results for input(s): AMMONIA in the last 168 hours. Coagulation profile No results for input(s): INR, PROTIME in the last 168 hours. COVID-19 Labs  Recent Labs    07/07/18 0230 07/08/18 0500 07/09/18 0457 07/09/18 0458  DDIMER 2.17* 1.97* 1.92*  --   FERRITIN 197 214  --  240  CRP <0.8 <0.8  --  <0.8    No results found for: SARSCOV2NAA  CBC: Recent Labs  Lab 07/03/18 0540 07/04/18 0535 07/05/18 0535 07/06/18 0558 07/07/18 0230 07/09/18 0457  WBC 11.9* 16.3* 16.2* 15.8* 17.1* 15.7*  NEUTROABS 10.7* 14.7* 13.7*  --   --   --   HGB 11.9* 12.8 12.8 12.8 12.9 13.3   HCT 36.8 39.4 40.6 39.9 38.7 41.4  MCV 89.5 90.2 91.6 91.1 89.6 89.2  PLT 322 364 385 322 318 334   Cardiac Enzymes: Recent Labs  Lab 07/03/18 0540 07/04/18 0535 07/05/18 0535  CKTOTAL 124 84 61   BNP (last 3 results) No results for input(s): PROBNP in the last 8760 hours. CBG: Recent Labs  Lab 07/08/18 1717 07/08/18 2042 07/09/18 0841 07/09/18 1210 07/09/18 1655  GLUCAP 149* 253* 71 220* 261*   D-Dimer: Recent Labs    07/08/18 0500 07/09/18 0457  DDIMER 1.97* 1.92*   Hgb A1c: No results for input(s): HGBA1C in the last 72 hours.  Lipid Profile: No results for input(s): CHOL, HDL, LDLCALC, TRIG, CHOLHDL, LDLDIRECT in the last 72 hours. Thyroid function studies: No results for input(s): TSH, T4TOTAL, T3FREE, THYROIDAB in the last 72 hours.  Invalid input(s): FREET3 Anemia work up: Recent Labs    07/08/18 0500 07/09/18 0458  FERRITIN 214 240   Sepsis Labs: Recent Labs  Lab 07/05/18 0535 07/06/18 0558 07/07/18 0230 07/09/18 0457  WBC 16.2* 15.8* 17.1* 15.7*   Microbiology Recent Results (from the past 240 hour(s))  Blood Culture (routine x 2)     Status: None   Collection Time: 06/30/18  5:46 PM  Result Value Ref Range Status   Specimen Description   Final    BLOOD LEFT ANTECUBITAL Performed at [facility], [place] [address] [place], [place] [address]    Special Requests   Final    BOTTLES DRAWN AEROBIC AND ANAEROBIC Blood Culture results may not be optimal due to an excessive volume of blood received in culture bottles Performed at [place] [address] [place], [place] [address]    Culture   Final    NO GROWTH 5 DAYS Performed at [facility], [place] [address], [place], [place] [address]    Report Status 07/05/2018 FINAL  Final  Blood Culture (routine x 2)     Status: None   Collection Time: 06/30/18  5:50 PM  Result Value Ref Range Status   Specimen Description   Final    BLOOD RIGHT  ANTECUBITAL Performed at [place] [address] [place], [place] [address]    Special Requests   Final    BOTTLES DRAWN AEROBIC AND ANAEROBIC Blood Culture adequate volume Performed at [place] [address] [place], [place] [address]    Culture   Final    NO GROWTH 5 DAYS Performed at [facility], [place] [address], [place], [place] [address]    Report Status 07/05/2018 FINAL  Final     Medications:   . amLODipine  10 mg Oral q morning - 10a  . atorvastatin  80 mg Oral QPM  . chlorhexidine  15 mL Mouth Rinse BID  . Chlorhexidine Gluconate Cloth  6 each Topical Q0600  . enoxaparin (LOVENOX) injection  55 mg Subcutaneous Q12H  . escitalopram  20 mg Oral QPM  . insulin aspart  0-15 Units Subcutaneous TID WC  . insulin aspart  0-5 Units Subcutaneous QHS  . insulin aspart  6 Units Subcutaneous TID WC  . insulin glargine  50 Units Subcutaneous BID  . mouth rinse  15 mL Mouth Rinse q12n4p  . methylPREDNISolone (SOLU-MEDROL) injection  60 mg Intravenous Q12H  . pantoprazole  40 mg Oral Daily  . sodium chloride flush  3 mL Intravenous Q12H  . sodium chloride flush  3 mL Intravenous Q12H  . vitamin C  500 mg Oral Daily  . zinc sulfate  220 mg Oral Daily   Continuous Infusions: . sodium chloride Stopped (07/06/18 2330)    LOS: 9 days   [employer]  [facility]  07/09/2018, 7:07 PM   Critical care: 106mins

## 2018-07-09 NOTE — Progress Notes (Signed)
NAME:  [name] [name], MRN:  [id number], DOB:  28-Sep-1950, LOS: 9 ADMISSION DATE:  06/30/2018, CONSULTATION DATE:  Jul 01, 2018 REFERRING MD:  Dr. Sloan Leiter, CHIEF COMPLAINT:  Dyspnea   Brief History   68 year old female with a past medical history significant for diabetes, obstructive sleep apnea admitted on May 6 with acute respiratory failure with hypoxemia secondary to COVID-19.  Transferred to the intensive care unit on Jul 01, 2018 for worsening hypoxemia and dyspnea.  Past Medical History  Obstructive sleep apnea Hypertension Diabetes mellitus Hypertension Hyperlipidemia GERD Depression Coronary artery disease  Significant Hospital Events   May 6 admitted May 7 moved to ICU for worsening hypoxemia and dyspnea May 8 > BIPAP started overnight for OSA, tolerated well May 9 Heated high flow started, tolerating well 5/11 started solumedrol  Consults:  PCCM  Procedures:    Significant Diagnostic Tests:    Micro Data:  SARS-CoV-2 positive prior to admission at outside facility. 5/6 blood culture >  Antimicrobials:  5/7 ceftriaxone >5/12 5/7 azithro > 5/12  Interim history/subjective:  Oxygenation improving Slept overnight without BiPAP, using heated high flow. Says she feels well this morning, ready to go to another unit Wants to get up and walk around  Objective   Blood pressure (!) 149/65, pulse 60, temperature 98.8 F (37.1 C), temperature source Oral, resp. rate 13, height 5\' 4"  (1.626 m), weight 112.5 kg, last menstrual period 11/25/2002, SpO2 90 %.    FiO2 (%):  [50 %] 50 %   Intake/Output Summary (Last 24 hours) at 07/09/2018 0824 Last data filed at 07/09/2018 0503 Gross per 24 hour  Intake 1266 ml  Output 900 ml  Net 366 ml   Filed Weights   06/30/18 1715 07/01/18 1415  Weight: 110 kg 112.5 kg    Examination:  General:  Resting comfortably in bed HENT: NCAT OP clear PULM: CTA B, normal effort CV: RRR, no mgr GI: BS+, soft, nontender MSK:  normal bulk and tone Neuro: awake, alert, no distress, MAEW   Resolved Hospital Problem list     Assessment & Plan:  Acute respiratory failure with hypoxemia due to COVID pneumonia: Oxygenation about the same Respiratory status seems improved today Continue high flow via salter Up, ambulate as able with PT today Administer O2 to maintain O2 saturation greater than 88% For now continue Solu-Medrol PT consult  Obstructive sleep apnea: And lieu of CPAP we will use heated high flow again tonight as this was effective last night  Possible bacterial community-acquired pneumonia: Monitor off of antibiotics  Hypertension: Telemetry monitoring Monitor hemodynamic  Diabetes mellitus type 2 per primary  Best practice:  Diet: regular diet Pain/Anxiety/Delirium protocol (if indicated): n/a VAP protocol (if indicated): n/a DVT prophylaxis: lovenox 0.5mg /kg sub q bid GI prophylaxis: PPI Glucose control: SSI Mobility: to chair Code Status: full Family Communication: I updated daughter today  Disposition:  To PCU Today  Labs   CBC: Recent Labs  Lab 07/03/18 0540 07/04/18 0535 07/05/18 0535 07/06/18 0558 07/07/18 0230 07/09/18 0457  WBC 11.9* 16.3* 16.2* 15.8* 17.1* 15.7*  NEUTROABS 10.7* 14.7* 13.7*  --   --   --   HGB 11.9* 12.8 12.8 12.8 12.9 13.3  HCT 36.8 39.4 40.6 39.9 38.7 41.4  MCV 89.5 90.2 91.6 91.1 89.6 89.2  PLT 322 364 385 322 318 258    Basic Metabolic Panel: Recent Labs  Lab 07/03/18 0540 07/04/18 0535 07/05/18 0535 07/06/18 0558 07/07/18 0230 07/08/18 0500 07/09/18 0457  NA 140 142  146* 143 142 140 138  K 4.1 3.5 3.9 3.5 3.7 3.4* 3.7  CL 101 101 105 99 98 98 98  CO2 26 26 29 31 30  33* 29  GLUCOSE 317* 315* 230* 234* 202* 144* 134*  BUN 47* 48* 45* 40* 41* 39* 33*  CREATININE 1.10* 1.09* 0.91 0.88 0.94 0.80 0.80  CALCIUM 8.3* 8.5* 8.9 8.7* 8.8* 9.1 8.8*  MG 1.9 2.1 2.3 2.2 2.1  --   --   PHOS  --   --   --  4.1 3.6  --   --    GFR:  Estimated Creatinine Clearance: 85 mL/min (by C-G formula based on SCr of 0.8 mg/dL). Recent Labs  Lab 07/05/18 0535 07/06/18 0558 07/07/18 0230 07/09/18 0457  WBC 16.2* 15.8* 17.1* 15.7*    Liver Function Tests: Recent Labs  Lab 07/03/18 0540 07/04/18 0535 07/05/18 0535  AST 45* 19 64*  ALT 36 42 60*  ALKPHOS 46 58 71  BILITOT 0.4 0.7 0.8  PROT 6.9 7.3 7.5  ALBUMIN 3.0* 3.3* 3.5   Recent Labs  Lab 07/04/18 0535  LIPASE 35   No results for input(s): AMMONIA in the last 168 hours.  ABG    Component Value Date/Time   PHART 7.412 07/03/2018 0009   PCO2ART 35.2 07/03/2018 0009   PO2ART 45.0 (L) 07/03/2018 0009   HCO3 22.6 07/03/2018 0009   TCO2 24 07/03/2018 0009   ACIDBASEDEF 2.0 07/03/2018 0009   O2SAT 83.0 07/03/2018 0009     Coagulation Profile: No results for input(s): INR, PROTIME in the last 168 hours.  Cardiac Enzymes: Recent Labs  Lab 07/03/18 0540 07/04/18 0535 07/05/18 0535  CKTOTAL 124 84 61    HbA1C: Hgb A1c MFr Bld  Date/Time Value Ref Range Status  02/01/2018 11:25 AM 6.7 (H) 4.8 - 5.6 % Final    Comment:             Prediabetes: 5.7 - 6.4          Diabetes: >6.4          Glycemic control for adults with diabetes: <7.0   10/06/2017 11:35 AM 6.7 (H) 4.8 - 5.6 % Final    Comment:             Prediabetes: 5.7 - 6.4          Diabetes: >6.4          Glycemic control for adults with diabetes: <7.0     CBG: Recent Labs  Lab 07/07/18 2057 07/08/18 0741 07/08/18 1129 07/08/18 1717 07/08/18 2042  GLUCAP 282* 117* 154* 149* 253*       Critical care time: n/a   Roselie Awkward, MD [place] [facility] Pager: [phone] Cell: [phone] If no response, call [phone]

## 2018-07-09 NOTE — Evaluation (Signed)
Physical Therapy Evaluation Patient Details Name: [name] [name] MRN: [id number] DOB: [birth year] Today's Date: 07/09/2018   History of Present Illness  68 y.o. female admitted on 06/30/18 with acute respiratory failure with hypoxemia secondary to COVID-19.  Pt with significant PMH of DM2, OSA on CPAP at night, HTN, CAD, R wrist carpal tunnel s/p release, L TKA, and R THA.    Clinical Impression  Pt tolerated standing and sitting exercises.  Gait limited by the length of the HFNC (not prepared with a portable tank).  Pt is on 10 L O2 HFNC and desats to the mid 80s with activity, however was not in much distress DOE 2/4 and rebounds with standing or sitting rest breaks and pursed lip breathing.  She would benefit from OT and PT to increase the level of her therapy while she is here and address ALDs and energy conservation.   PT to follow acutely for deficits listed below.      Follow Up Recommendations No PT follow up    Equipment Recommendations  None recommended by PT;Other (comment)(possible home O2)    Recommendations for Other Services OT consult     Precautions / Restrictions Precautions Precautions: Other (comment) Precaution Comments: on HFNC, montior sats      Mobility  Bed Mobility               General bed mobility comments: Pt was OOB in the recliner chair.   Transfers Overall transfer level: Needs assistance Equipment used: None Transfers: Sit to/from Stand Sit to Stand: Min guard         General transfer comment: Min guard assist for safety  Ambulation/Gait Ambulation/Gait assistance: Min guard Gait Distance (Feet): 5 Feet Assistive device: 1 person hand held assist Gait Pattern/deviations: WFL(Within Functional Limits)     General Gait Details: Limited by HFNC (short tubeing).  So we marched in place, danced in place, stopping to breathe between sets.           Balance Overall balance assessment: Needs assistance Sitting-balance support: Feet  supported;No upper extremity supported Sitting balance-Leahy Scale: Good     Standing balance support: No upper extremity supported Standing balance-Leahy Scale: Fair Standing balance comment: close supervision in standing.                              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, newspaper   Dominant Hand: Right    Extremity/Trunk Assessment   Upper Extremity Assessment Upper Extremity Assessment: Defer to OT evaluation    Lower Extremity Assessment Lower Extremity Assessment: Generalized weakness    Cervical / Trunk Assessment Cervical / Trunk Assessment: Normal  Communication   Communication: No difficulties  Cognition Arousal/Alertness: Awake/alert Behavior During Therapy: WFL for tasks assessed/performed Overall Cognitive Status: Within Functional Limits for tasks assessed  Exercises General Exercises - Upper Extremity Shoulder Flexion: AROM;Both;10 reps Elbow Flexion: AROM;Both;10 reps General Exercises - Lower Extremity Ankle Circles/Pumps: AROM;Both;20 reps Long Arc Quad: AROM;Both;10 reps Hip Flexion/Marching: AROM;Both;10 reps;Seated;Standing   Assessment/Plan    PT Assessment Patient needs continued PT services  PT Problem List Decreased activity tolerance;Decreased strength;Cardiopulmonary status limiting activity;Decreased  knowledge of precautions       PT Treatment Interventions DME instruction;Gait training;Functional mobility training;Therapeutic activities;Therapeutic exercise;Neuromuscular re-education;Balance training;Patient/family education    PT Goals (Current goals can be found in the Care Plan section)  Acute Rehab PT Goals Patient Stated Goal: to get home, but protect her family from getting sick. PT Goal Formulation: With patient Time For Goal Achievement: 07/23/18 Potential to Achieve Goals: Good    Frequency Min 5X/week           AM-PAC PT "6 Clicks" Mobility  Outcome Measure Help needed turning from your back to your side while in a flat bed without using bedrails?: None Help needed moving from lying on your back to sitting on the side of a flat bed without using bedrails?: None Help needed moving to and from a bed to a chair (including a wheelchair)?: A Little Help needed standing up from a chair using your arms (e.g., wheelchair or bedside chair)?: None Help needed to walk in hospital room?: A Little Help needed climbing 3-5 steps with a railing? : A Little 6 Click Score: 21    End of Session Equipment Utilized During Treatment: Oxygen(10 L O2 HFNC) Activity Tolerance: Patient limited by fatigue Patient left: in chair;with call bell/phone within reach;with nursing/sitter in room   PT Visit Diagnosis: Muscle weakness (generalized) (M62.81);Difficulty in walking, not elsewhere classified (R26.2)    Time: 3007-6226 PT Time Calculation (min) (ACUTE ONLY): 25 min   Charges:       Wells Guiles B. Kerryann Allaire, PT, DPT  Acute Rehabilitation [phone] pager #[phone] office   PT Evaluation $PT Eval Moderate Complexity: 1 Mod PT Treatments $Therapeutic Activity: 8-22 mins        07/09/2018, 4:38 PM

## 2018-07-09 NOTE — Research (Signed)
Late Entry for [id number]  VITAK- 19 Antibody Fast Detection Kit Study  Informed Consent  Subject Name: [name] [name]. Sones  This Patient has been consented to Arm 1 of the above reference Clinical Trial in compliance with FDA regulations, GCP Guidelines and [employer] requirements. The informed consent and study related procedures have been explained to the subject by Study Coordinator prior to obtaining consent. The subject expressed comprehension and requirements of the study and subject involved activities. No study procedures were conducted prior to obtained consent. The patient was given ample time to review the consent form. The subject was informed that this study is for the purpose of research and that participation was completely voluntary. All questions were answered to the satisfaction of the subject. The subject voluntarily signed consent version 1.0 on [id number]. A copy of the signed consent was given to the patient and a copy was placed in the subject's medical record. The subject was thanked for their participation and contribution to science and research.   PI Dr. Brand Males, via video participated in the consent process   Due to the nature of this study being of minimal risk, the [employer] Informed Consent Document Checklist and Coordinator Visit Worksheet will not be utilized for the inpatient arm of this trial.  Please refer to the subject paper source binder for further information regarding todays' visit.     [name] BS, [place], [place], [place]

## 2018-07-10 LAB — GLUCOSE, CAPILLARY
Glucose-Capillary: 58 mg/dL — ABNORMAL LOW (ref 70–99)
Glucose-Capillary: 250 mg/dL — ABNORMAL HIGH (ref 70–99)
Glucose-Capillary: 180 mg/dL — ABNORMAL HIGH (ref 70–99)
Glucose-Capillary: 134 mg/dL — ABNORMAL HIGH (ref 70–99)
Glucose-Capillary: 241 mg/dL — ABNORMAL HIGH (ref 70–99)

## 2018-07-10 LAB — C-REACTIVE PROTEIN: CRP: <0.8 mg/dL (ref <1.0)

## 2018-07-10 LAB — D-DIMER, QUANTITATIVE: D-Dimer, Quant: 1.5 ug/mL-FEU — ABNORMAL HIGH (ref 0.00–0.50)

## 2018-07-10 MED ORDER — INSULIN ASPART 100 UNIT/ML ~~LOC~~ SOLN
6.0000 [IU] | Freq: Three times a day (TID) | SUBCUTANEOUS | Status: DC
  Administered 2018-07-11 – 2018-07-13 (×7): 6 [IU] via SUBCUTANEOUS

## 2018-07-10 NOTE — Progress Notes (Signed)
[facility] PROGRESS NOTE    Progress Note  [name] [name]  QIO:[id number] DOB: 15-Apr-1950 DOA: 06/30/2018 PCP: Vicenta Aly, FNP     Brief Narrative:   [name] [name] is an 68 y.o. female past medical history of essential hypertension, diabetes mellitus type 2, chronic diastolic heart failure obstructive sleep apnea tested positive for COVID-19 approximately 1 week ago into the ED she was found hypoxemic and respiratory failure transferred to [facility], post admission her hypoxemia worsened and subsequently transferred to the ICU on 07/01/2018.  Events: 07/01/2018 moved to the ICU due to hypoxemia tachypnea.  Micro: 06/30/2018 blood cultures have remained negative 06/30/2018 SARS-CoV-2 positive at outside facility.  Medications: 07/01/2018-5/14 IV Rocephin and azithro 07/01/2018-5/15 IV steroids 07/01/2018 Actemra 07/02/2018 convalescent plasma  Assessment/Plan:   Acute respiratory disease due to COVID-19 virus/SIRS due to COVID:: High flow nasal cannula at 5 L appreciate PCCM assistance. She was prone on 07/05/2018 Further management per CCM appreciate assistance. Her inflammatory markers been stable.  She completed  7 days of intravenous steroids. She also received convalescent plasma as well as Actemra.  She is slowly improving  COVID-19 Labs  Recent Labs    07/08/18 0500 07/09/18 0457 07/09/18 0458 07/10/18 0321  DDIMER 1.97* 1.92*  --  1.50*  FERRITIN 214  --  240  --   CRP <0.8  --  <0.8 <0.8    No results found for: SARSCOV2NAA   Chronic diastolic heart failure: Currently appears euvolemic.  Review of her outpatient meds do not indicate that she is on chronic Lasix.  She is however on chlorthalidone.  Diuretics are currently on hold.  Volume status appears to be stable.  Continue to hold Lasix for now.  Essential hypertension: Currently on amlodipine.  Blood pressure stable.  Diabetes mellitus type 2: Had episode of hypoglycemia today. Will  decrease meal coverage  New mild abdominal pain: Her abdominal pain is now resolved.  Work-up was negative.  Obstructive sleep apnea  Morbid obesity with alveolar hypoventilation ([place])   DVT prophylaxis: lovenox Family Communication: no family Disposition Plan/Barrier to D/C: Transfer to progressive care Code Status:     Code Status Orders  (From admission, onward)         Start     Ordered   07/01/18 0040  Full code  Continuous     07/01/18 0040        Code Status History    Date Active Date Inactive Code Status Order ID Comments User Context   06/30/2018 2251 07/01/2018 0040 Full Code [id number]  Toy Baker, MD ED   11/29/2013 1958 11/30/2013 1340 Full Code [id number]  Adrian Prows, MD Inpatient   11/29/2013 1704 11/29/2013 1958 Full Code [id number]  Adrian Prows, MD ED   09/02/2013 1708 09/03/2013 1908 Full Code [id number]  Penelope Coop Inpatient   08/02/2012 1639 08/04/2012 1709 Full Code [id number]  Erlene Senters, PA-C Inpatient        IV Access:    Peripheral IV   Procedures and diagnostic studies:   No results found.   Medical Consultants:    PCCM  Anti-Infectives:   IV Rocephin and azithromycin  Subjective:    [name] [name] continues to feel better. Has a non productive cough.  Objective:    Vitals:   07/10/18 1514 07/10/18 1703 07/10/18 1722 07/10/18 1723  BP:  (!) 146/48 (!) 146/48   Pulse: 71 73 66 66  Resp:  (!) 27 18 (!) 21  Temp:  98.3 F (36.8 C)    TempSrc:  Oral    SpO2: 93% 92% 94% 94%  Weight:      Height:        Intake/Output Summary (Last 24 hours) at 07/10/2018 1928 Last data filed at 07/10/2018 0600 Gross per 24 hour  Intake 240 ml  Output -  Net 240 ml   Filed Weights   06/30/18 1715 07/01/18 1415  Weight: 110 kg 112.5 kg    Exam: General exam: Alert, awake, oriented x 3 Respiratory system: Clear to auscultation. Respiratory effort normal. Cardiovascular system:RRR. No murmurs, rubs, gallops.  Gastrointestinal system: Abdomen is nondistended, soft and nontender. No organomegaly or masses felt. Normal bowel sounds heard. Central nervous system: Alert and oriented. No focal neurological deficits. Extremities: No C/C/E, +pedal pulses Skin: No rashes, lesions or ulcers Psychiatry: Judgement and insight appear normal. Mood & affect appropriate.      Data Reviewed:    Labs: Basic Metabolic Panel: Recent Labs  Lab 07/04/18 0535 07/05/18 0535 07/06/18 0558 07/07/18 0230 07/08/18 0500 07/09/18 0457  NA 142 146* 143 142 140 138  K 3.5 3.9 3.5 3.7 3.4* 3.7  CL 101 105 99 98 98 98  CO2 26 29 31 30  33* 29  GLUCOSE 315* 230* 234* 202* 144* 134*  BUN 48* 45* 40* 41* 39* 33*  CREATININE 1.09* 0.91 0.88 0.94 0.80 0.80  CALCIUM 8.5* 8.9 8.7* 8.8* 9.1 8.8*  MG 2.1 2.3 2.2 2.1  --   --   PHOS  --   --  4.1 3.6  --   --    GFR Estimated Creatinine Clearance: 85 mL/min (by C-G formula based on SCr of 0.8 mg/dL). Liver Function Tests: Recent Labs  Lab 07/04/18 0535 07/05/18 0535  AST 19 64*  ALT 42 60*  ALKPHOS 58 71  BILITOT 0.7 0.8  PROT 7.3 7.5  ALBUMIN 3.3* 3.5   Recent Labs  Lab 07/04/18 0535  LIPASE 35   No results for input(s): AMMONIA in the last 168 hours. Coagulation profile No results for input(s): INR, PROTIME in the last 168 hours. COVID-19 Labs  Recent Labs    07/08/18 0500 07/09/18 0457 07/09/18 0458 07/10/18 0321  DDIMER 1.97* 1.92*  --  1.50*  FERRITIN 214  --  240  --   CRP <0.8  --  <0.8 <0.8    No results found for: SARSCOV2NAA  CBC: Recent Labs  Lab 07/04/18 0535 07/05/18 0535 07/06/18 0558 07/07/18 0230 07/09/18 0457  WBC 16.3* 16.2* 15.8* 17.1* 15.7*  NEUTROABS 14.7* 13.7*  --   --   --   HGB 12.8 12.8 12.8 12.9 13.3  HCT 39.4 40.6 39.9 38.7 41.4  MCV 90.2 91.6 91.1 89.6 89.2  PLT 364 385 322 318 334   Cardiac Enzymes: Recent Labs  Lab 07/04/18 0535 07/05/18 0535  CKTOTAL 84 61   BNP (last 3 results) No results  for input(s): PROBNP in the last 8760 hours. CBG: Recent Labs  Lab 07/09/18 2240 07/10/18 0816 07/10/18 1023 07/10/18 1209 07/10/18 1700  GLUCAP 238* 58* 250* 180* 134*   D-Dimer: Recent Labs    07/09/18 0457 07/10/18 0321  DDIMER 1.92* 1.50*   Hgb A1c: No results for input(s): HGBA1C in the last 72 hours. Lipid Profile: No results for input(s): CHOL, HDL, LDLCALC, TRIG, CHOLHDL, LDLDIRECT in the last 72 hours. Thyroid function studies: No results for input(s): TSH, T4TOTAL, T3FREE, THYROIDAB in the last 72 hours.  Invalid input(s): FREET3 Anemia work  up: Recent Labs    07/08/18 0500 07/09/18 0458  FERRITIN 214 240   Sepsis Labs: Recent Labs  Lab 07/05/18 0535 07/06/18 0558 07/07/18 0230 07/09/18 0457  WBC 16.2* 15.8* 17.1* 15.7*   Microbiology No results found for this or any previous visit (from the past 240 hour(s)).   Medications:   . amLODipine  10 mg Oral q morning - 10a  . atorvastatin  80 mg Oral QPM  . chlorhexidine  15 mL Mouth Rinse BID  . Chlorhexidine Gluconate Cloth  6 each Topical Q0600  . enoxaparin (LOVENOX) injection  55 mg Subcutaneous Q12H  . escitalopram  20 mg Oral QPM  . insulin aspart  0-15 Units Subcutaneous TID WC  . insulin aspart  0-5 Units Subcutaneous QHS  . insulin aspart  9 Units Subcutaneous TID WC  . insulin glargine  50 Units Subcutaneous BID  . mouth rinse  15 mL Mouth Rinse q12n4p  . pantoprazole  40 mg Oral Daily  . sodium chloride flush  3 mL Intravenous Q12H  . sodium chloride flush  3 mL Intravenous Q12H  . vitamin C  500 mg Oral Daily  . zinc sulfate  220 mg Oral Daily   Continuous Infusions: . sodium chloride Stopped (07/06/18 2330)    LOS: 10 days   [employer]  [facility]  07/10/2018, 7:28 PM   Critical care: 69mins

## 2018-07-10 NOTE — Progress Notes (Signed)
Physical Therapy Treatment Patient Details Name: [name] [name] MRN: [id number] DOB: 1950/09/09 Today's Date: 07/10/2018    History of Present Illness 68 y.o. female admitted on 06/30/18 with acute respiratory failure with hypoxemia secondary to COVID-19.  Pt with significant PMH of DM2, OSA on CPAP at night, HTN, CAD, R wrist carpal tunnel s/p release, L TKA, and R THA.      PT Comments    Patient highly motivated. Does well with pursed lip breathing. Requires vc to take breaks when SpO2 is low (she does not feel she is low) with lowest 83% when coughing after ambulation. Ambulated on 6-8 L via HFNC x 80 ft with RW with standing rest x 1 and SpO2 88-91%. Educated and performed UE exercises with theraband and educated on LE exercises with handout provided for both.     Follow Up Recommendations  No PT follow up     Equipment Recommendations  None recommended by PT;Other (comment)(possible home O2)    Recommendations for Other Services OT consult     Precautions / Restrictions Precautions Precautions: Other (comment) Precaution Comments: on HFNC, montior sats Restrictions Weight Bearing Restrictions: No    Mobility  Bed Mobility               General bed mobility comments: Pt was OOB in the recliner chair.   Transfers Overall transfer level: Needs assistance Equipment used: Rolling walker (2 wheeled) Transfers: Sit to/from Stand Sit to Stand: Min guard         General transfer comment: vc for safe use of RW  Ambulation/Gait Ambulation/Gait assistance: Min guard Gait Distance (Feet): 80 Feet(12 ft to bathroom initially) Assistive device: Rolling walker (2 wheeled) Gait Pattern/deviations: WFL(Within Functional Limits)     General Gait Details: utilized RW due to amount of equipment to manage and for potential benefit of UE support for breath supportl. Pt down to 88% initial 15 ft despite slow pace, pursed lip breathing, and no talking. Increased to 8L for  remainder of walk with SpO2 89-91%. On return to room, pt with coughing spell with SpO2 down to 83%, quickly up to 85% and then slowly returned to 88%, then ultimately 91%   Stairs             Wheelchair Mobility    Modified Rankin (Stroke Patients Only)       Balance Overall balance assessment: Needs assistance Sitting-balance support: Feet supported;No upper extremity supported Sitting balance-Leahy Scale: Good                                      Cognition Arousal/Alertness: Awake/alert Behavior During Therapy: WFL for tasks assessed/performed Overall Cognitive Status: Within Functional Limits for tasks assessed                                        Exercises General Exercises - Upper Extremity Shoulder Flexion: Both;10 reps;Seated;Theraband;Strengthening(unilateral, rest between sets, 2nd level tband) Shoulder ABduction: Strengthening;Both;10 reps;Seated;Theraband(unilateral, rest between sets, 2nd level tband) Elbow Flexion: Both;10 reps;Self ROM;Theraband;Seated(unilateral, rest between sets, 2nd level tband) Elbow Extension: Strengthening;Both;5 reps;Seated;Theraband(unilateral, rest between sets, 2nd level tband) General Exercises - Lower Extremity Ankle Circles/Pumps: AROM;Both;20 reps Long Arc Quad: AROM;Both;10 reps Hip Flexion/Marching: AROM;Both;10 reps;Seated;Standing Other Exercises Other Exercises: demonstrated/reviewed LE exercises per handout (pt familiar from prior THA and TKA); deferred  pt return demonstration to conserve energy to allow ambulation outside room    General Comments        Pertinent Vitals/Pain Pain Assessment: No/denies pain    Home Living                      Prior Function            PT Goals (current goals can now be found in the care plan section) Acute Rehab PT Goals Patient Stated Goal: to get home, but protect her family from getting sick. Time For Goal Achievement:  07/23/18 Progress towards PT goals: Progressing toward goals    Frequency    Min 5X/week      PT Plan Current plan remains appropriate    Co-evaluation              AM-PAC PT "6 Clicks" Mobility   Outcome Measure  Help needed turning from your back to your side while in a flat bed without using bedrails?: None Help needed moving from lying on your back to sitting on the side of a flat bed without using bedrails?: None Help needed moving to and from a bed to a chair (including a wheelchair)?: A Little Help needed standing up from a chair using your arms (e.g., wheelchair or bedside chair)?: None Help needed to walk in hospital room?: A Little Help needed climbing 3-5 steps with a railing? : A Little 6 Click Score: 21    End of Session Equipment Utilized During Treatment: Oxygen(6-8 L HFNC) Activity Tolerance: Patient limited by fatigue Patient left: in chair;with call bell/phone within reach   PT Visit Diagnosis: Muscle weakness (generalized) (M62.81);Difficulty in walking, not elsewhere classified (R26.2)     Time: 3976-7341 PT Time Calculation (min) (ACUTE ONLY): 47 min  Charges:  $Gait Training: 8-22 mins $Therapeutic Exercise: 8-22 mins                        [employer], PT 07/10/2018, 3:24 PM

## 2018-07-10 NOTE — Progress Notes (Signed)
Patient is using her incentive spirometer and is reaching 1000 mL consistently.

## 2018-07-11 LAB — GLUCOSE, CAPILLARY
Glucose-Capillary: 53 mg/dL — ABNORMAL LOW (ref 70–99)
Glucose-Capillary: 102 mg/dL — ABNORMAL HIGH (ref 70–99)
Glucose-Capillary: 184 mg/dL — ABNORMAL HIGH (ref 70–99)
Glucose-Capillary: 157 mg/dL — ABNORMAL HIGH (ref 70–99)
Glucose-Capillary: 204 mg/dL — ABNORMAL HIGH (ref 70–99)

## 2018-07-11 MED ORDER — HYDROCODONE-ACETAMINOPHEN 5-325 MG PO TABS
1.0000 | ORAL_TABLET | ORAL | Status: DC | PRN
  Administered 2018-07-12: 1 via ORAL
  Filled 2018-07-11: qty 2

## 2018-07-11 MED ORDER — CHLORTHALIDONE 25 MG PO TABS
25.0000 mg | ORAL_TABLET | Freq: Every morning | ORAL | Status: DC
  Administered 2018-07-11 – 2018-07-13 (×3): 25 mg via ORAL
  Filled 2018-07-11 (×4): qty 1

## 2018-07-11 NOTE — Progress Notes (Signed)
Called patient's son and gave him update on his mother. I answered all of his questions regarding patient's condition and O2 sat;urations. Told him to call any time he has questions regarding patient. He was very grateful for the update.

## 2018-07-11 NOTE — Progress Notes (Addendum)
Occupational Therapy Evaluation Patient Details Name: [name] [name] MRN: [id number] DOB: 1950/12/10 Today's Date: 07/11/2018    History of Present Illness 68 y.o. female admitted on 06/30/18 with acute respiratory failure with hypoxemia secondary to COVID-19.  Pt with significant PMH of DM2, OSA on CPAP at night, HTN, CAD, R wrist carpal tunnel s/p release, L TKA, and R THA.     Clinical Impression   PTA, pt independent without use of AD with mobility and ADL. Enjoys spending time with her family and her grand children. Session completed on 4L O2. At beginning of session BP 149/77; HR 70; SpO2 93 on 4L.Completed toilet transfer then ambulated @ 150 ft @ RW level with desat to 84, quickly rebounds to 94; HR 75. Pt states she does not feel "short of breath". Encourage pt to walk to the bathroom for ADL needs. Began education on energy conservation and use of AE to assist with ADL. Pt completing HEP while in room. Will follow acutely to facilitate safe DC home. Do not anticipate need for folow up OT at this time. Appears to have excellent family support. Pt very appreciative.     Follow Up Recommendations  No OT follow up;Supervision - Intermittent    Equipment Recommendations  None recommended by OT    Recommendations for Other Services       Precautions / Restrictions Precautions Precautions: Other (comment) Precaution Comments:  montior sats Restrictions Weight Bearing Restrictions: No      Mobility Bed Mobility               General bed mobility comments: Pt was OOB in the recliner chair.   Transfers Overall transfer level: Needs assistance Equipment used: Rolling walker (2 wheeled) Transfers: Sit to/from Stand Sit to Stand: Supervision             Balance Overall balance assessment: Needs assistance Sitting-balance support: Feet supported;No upper extremity supported Sitting balance-Leahy Scale: Good       Standing balance-Leahy Scale: Fair                              ADL either performed or assessed with clinical judgement   ADL Overall ADL's : Needs assistance/impaired     Grooming: Set up;Sitting   Upper Body Bathing: Set up;Sitting   Lower Body Bathing: Minimal assistance;Sit to/from stand   Upper Body Dressing : Set up;Sitting   Lower Body Dressing: Moderate assistance;Sit to/from stand Lower Body Dressing Details (indicate cue type and reason): uses sock aid at times for R foot; would benefit form reacher Toilet Transfer: Min guard;BSC;Stand-pivot   Toileting- Clothing Manipulation and Hygiene: Set up;Sit to/from stand       Functional mobility during ADLs: Rolling walker;Min guard General ADL Comments: Desats wtih ADL but rebounds quickly on 4L; would benefit from energy conservation; practiving pursed lip breathing     Vision         Perception     Praxis      Pertinent Vitals/Pain Pain Assessment: No/denies pain     Hand Dominance Right   Extremity/Trunk Assessment Upper Extremity Assessment Upper Extremity Assessment: Overall WFL for tasks assessed   Lower Extremity Assessment Lower Extremity Assessment: Defer to PT evaluation   Cervical / Trunk Assessment Cervical / Trunk Assessment: Normal   Communication Communication Communication: No difficulties   Cognition Arousal/Alertness: Awake/alert Behavior During Therapy: WFL for tasks assessed/performed Overall Cognitive Status: Within Functional Limits for tasks assessed  General Comments       Exercises Exercises: Other exercises General Exercises - Upper Extremity Shoulder Flexion: (unilateral, rest between sets, 2nd level tband) Shoulder ABduction: (unilateral, rest between sets, 2nd level tband) Elbow Flexion: (unilateral, rest between sets, 2nd level tband) Elbow Extension: (unilateral, rest between sets, 2nd level tband) General Exercises - Lower Extremity Ankle  Circles/Pumps: AROM;Both;20 reps Long Arc Quad: AROM;Both;10 reps Hip Flexion/Marching: AROM;Both;10 reps;Seated;Standing Other Exercises Other Exercises: reviewed "Covid HEP" handouts. Pt states she has been working on them   Shoulder Instructions      Home Living Family/patient expects to be discharged to:: Private residence Living Arrangements: Spouse/significant other;Children Available Help at Discharge: Family;Available 24 hours/day Type of Home: House Home Access: Ramped entrance     Home Layout: One level     Bathroom Shower/Tub: Occupational psychologist: Handicapped height Bathroom Accessibility: Yes How Accessible: Accessible via walker Home Equipment: [place] - 2 wheels;Shower seat - built in;Grab bars - tub/shower          Prior Functioning/Environment Level of Independence: Independent        Comments: loves being with her grand children and great grand children        OT Problem List: Decreased strength;Decreased activity tolerance;Obesity;Cardiopulmonary status limiting activity      OT Treatment/Interventions: Self-care/ADL training;Therapeutic exercise;Energy conservation;DME and/or AE instruction;Therapeutic activities;Patient/family education    OT Goals(Current goals can be found in the care plan section) Acute Rehab OT Goals Patient Stated Goal: to see her grand children and get better OT Goal Formulation: With patient Time For Goal Achievement: 07/25/18 Potential to Achieve Goals: Good  OT Frequency: Min 3X/week   Barriers to D/C:            Co-evaluation              AM-PAC OT "6 Clicks" Daily Activity     Outcome Measure Help from another person eating meals?: None Help from another person taking care of personal grooming?: None Help from another person toileting, which includes using toliet, bedpan, or urinal?: A Little Help from another person bathing (including washing, rinsing, drying)?: A Little Help from another  person to put on and taking off regular upper body clothing?: None Help from another person to put on and taking off regular lower body clothing?: A Little 6 Click Score: 21   End of Session Equipment Utilized During Treatment: Oxygen;Rolling walker(4L) Nurse Communication: Mobility status  Activity Tolerance: Patient tolerated treatment well Patient left: in chair;with call bell/phone within reach  OT Visit Diagnosis: Unsteadiness on feet (R26.81);Muscle weakness (generalized) (M62.81)                Time: 4174-0814 OT Time Calculation (min): 35 min Charges:  OT General Charges $OT Visit: 1 Visit OT Evaluation $OT Eval Moderate Complexity: 1 Mod OT Treatments $Self Care/Home Management : 8-22 mins  Maurie Boettcher, OT/L   Acute OT Clinical Specialist [place] Pager [phone] Office [phone]   [facility] 07/11/2018, 1:37 PM

## 2018-07-11 NOTE — Progress Notes (Signed)
Placed patient on [place] for HS per order.  20L  50% FIO2.  spo2 94%

## 2018-07-11 NOTE — Progress Notes (Signed)
[place] TEAM 1 - Stepdown/ICU TEAM  [name] [name]  LTJ:[id number] DOB: [birth year] DOA: 06/30/2018 PCP: Vicenta Aly, FNP    Brief Narrative:  [phone] w/ a hx of HTN, DM2, chronic diastolic CHF, and OSA who tested positive for SARS CoV-2 1 week prior to her presentation. In the ED she was found to be hypoxic and respiratory failure transferred to [facility], post admission her hypoxemia worsened and subsequently transferred to the ICU on 07/01/2018.  Significant Events: 5/5 admit 5/7 tx to ICU w/ hypoxia tachypnea   COVID-19 specific Treatment:  Actemra 5/7 Convalescent plasma 5/8 Steroids 5/7 > 5/15  Subjective: The patient is sitting up in a bedside chair.  She is alert conversant and oriented.  She is very pleasant.  She denies chest pain nausea vomiting or abdominal pain.  She denies any shortness of breath at rest.  She is presently on high flow nasal cannula at 4 L.  Assessment & Plan:  Acute respiratory disease due to COVID-19 virus/SIRS due to COVID completed  7 days of intravenous steroids - has received convalescent plasma & Actemra - making good progress - ambulate - wean O2 as able - PT/OT   Chronic diastolic heart failure: euvolemic on exam - chronic chlorthalidone presently on hold, but will resume today - check daily weights - net negative ~3.6L since admit   Filed Weights   06/30/18 1715 07/01/18 1415  Weight: 110 kg 112.5 kg    HTN BP controlled at this time   DM2 CBG quite variable (53-184) - follow w/o change for today   Obstructive sleep apnea Uses CPAP QHS religiously at home - cont same   Morbid obesity - Body mass index is 42.57 kg/m.  DVT prophylaxis: lovenox  Code Status: FULL CODE Family Communication: no family present at time of exam  Disposition Plan: med/surg bed - wean O2 - home when O2 requirement improved   Consultants:  PCCM   Antimicrobials:  Azithro 5/6 > 5/12 Rocphin 5/6 > 5/12  Objective: Blood pressure (!)  148/71, pulse (!) 58, temperature (!) 97.5 F (36.4 C), temperature source Oral, resp. rate 20, height 5\' 4"  (1.626 m), weight 112.5 kg, last menstrual period 11/25/2002, SpO2 95 %.  Intake/Output Summary (Last 24 hours) at 07/11/2018 0912 Last data filed at 07/11/2018 0600 Gross per 24 hour  Intake 480 ml  Output -  Net 480 ml   Filed Weights   06/30/18 1715 07/01/18 1415  Weight: 110 kg 112.5 kg    Examination: General: No acute respiratory distress Lungs: Clear to auscultation bilaterally without wheezes or crackles Cardiovascular: Regular rate and rhythm without murmur gallop or rub normal S1 and S2 Abdomen: Nontender, obese, soft, bowel sounds positive, no rebound, no ascites, no appreciable mass Extremities: 1+ B LE edema   CBC: Recent Labs  Lab 07/05/18 0535 07/06/18 0558 07/07/18 0230 07/09/18 0457  WBC 16.2* 15.8* 17.1* 15.7*  NEUTROABS 13.7*  --   --   --   HGB 12.8 12.8 12.9 13.3  HCT 40.6 39.9 38.7 41.4  MCV 91.6 91.1 89.6 89.2  PLT 385 322 318 263   Basic Metabolic Panel: Recent Labs  Lab 07/05/18 0535 07/06/18 0558 07/07/18 0230 07/08/18 0500 07/09/18 0457  NA 146* 143 142 140 138  K 3.9 3.5 3.7 3.4* 3.7  CL 105 99 98 98 98  CO2 29 31 30  33* 29  GLUCOSE 230* 234* 202* 144* 134*  BUN 45* 40* 41* 39* 33*  CREATININE 0.91  0.88 0.94 0.80 0.80  CALCIUM 8.9 8.7* 8.8* 9.1 8.8*  MG 2.3 2.2 2.1  --   --   PHOS  --  4.1 3.6  --   --    GFR: Estimated Creatinine Clearance: 85 mL/min (by C-G formula based on SCr of 0.8 mg/dL).  Liver Function Tests: Recent Labs  Lab 07/05/18 0535  AST 64*  ALT 60*  ALKPHOS 71  BILITOT 0.8  PROT 7.5  ALBUMIN 3.5    Cardiac Enzymes: Recent Labs  Lab 07/05/18 0535  CKTOTAL 61    HbA1C: Hgb A1c MFr Bld  Date/Time Value Ref Range Status  02/01/2018 11:25 AM 6.7 (H) 4.8 - 5.6 % Final    Comment:             Prediabetes: 5.7 - 6.4          Diabetes: >6.4          Glycemic control for adults with diabetes:  <7.0   10/06/2017 11:35 AM 6.7 (H) 4.8 - 5.6 % Final    Comment:             Prediabetes: 5.7 - 6.4          Diabetes: >6.4          Glycemic control for adults with diabetes: <7.0     CBG: Recent Labs  Lab 07/10/18 1209 07/10/18 1700 07/10/18 2043 07/11/18 0817 07/11/18 0849  GLUCAP 180* 134* 241* 53* 102*    Scheduled Meds: . amLODipine  10 mg Oral q morning - 10a  . atorvastatin  80 mg Oral QPM  . chlorhexidine  15 mL Mouth Rinse BID  . Chlorhexidine Gluconate Cloth  6 each Topical Q0600  . enoxaparin (LOVENOX) injection  55 mg Subcutaneous Q12H  . escitalopram  20 mg Oral QPM  . insulin aspart  0-15 Units Subcutaneous TID WC  . insulin aspart  0-5 Units Subcutaneous QHS  . insulin aspart  6 Units Subcutaneous TID WC  . insulin glargine  50 Units Subcutaneous BID  . mouth rinse  15 mL Mouth Rinse q12n4p  . pantoprazole  40 mg Oral Daily  . sodium chloride flush  3 mL Intravenous Q12H  . sodium chloride flush  3 mL Intravenous Q12H  . vitamin C  500 mg Oral Daily  . zinc sulfate  220 mg Oral Daily     LOS: 11 days   Cherene Altes, MD [facility] Office  [phone] Pager - Text Page per Amion  If 7PM-7AM, please contact night-coverage per Amion 07/11/2018, 9:12 AM

## 2018-07-12 LAB — GLUCOSE, CAPILLARY
Glucose-Capillary: 117 mg/dL — ABNORMAL HIGH (ref 70–99)
Glucose-Capillary: 226 mg/dL — ABNORMAL HIGH (ref 70–99)
Glucose-Capillary: 138 mg/dL — ABNORMAL HIGH (ref 70–99)
Glucose-Capillary: 177 mg/dL — ABNORMAL HIGH (ref 70–99)

## 2018-07-12 NOTE — Progress Notes (Signed)
[facility] PROGRESS NOTE    Progress Note  KATIE FARAONE  [id number]:[id number] DOB: May 24, 1950 DOA: 06/30/2018 PCP: Vicenta Aly, FNP     Brief Narrative:   [name] [name] is an 68 y.o. female past medical history of essential hypertension, diabetes mellitus type 2, chronic diastolic heart failure who tested positive for SARS [place] to 1 week prior to presentation, transferred to [place] for acute respiratory failure with hypoxia  Significant Events: 5/5 admit 5/7 tx to ICU w/ hypoxia tachypnea   COVID-19 specific Treatment:  Actemra 5/7 Convalescent plasma 5/8 Steroids 5/7 > 5/15  Assessment/Plan:   Acute respiratory failure with hypoxia due to SARS-CoV-2: Completed a 7-day course of IV steroids and convalescent plasma along with Actemra She has made good progress, will continue to wean off oxygen. We will consult physical therapy as patient is probably significantly deconditioned. Check saturations with ambulation.  Chronic diastolic heart failure: She appears to be euvolemic, she is about 4 L negative, she was started on chlorthalidone on 07/11/2018.  Essential hypertension: Elevated but stable. Currently on Norvasc and chlorthalidone.  Diabetes mellitus type II: Continue long-acting insulin plus sliding scale insulin. Blood glucose continues to be erratic when she was taken off steroids 07/09/2018 I suspect this is a residual effect. To new checks CBGs before meals and at bedtime.  Obstructive sleep apnea: Continue CPAP  Obesity    DVT prophylaxis: lovenox Family Communication:none Disposition Plan/Barrier to D/C: once off oxygen Code Status:     Code Status Orders  (From admission, onward)         Start     Ordered   07/01/18 0040  Full code  Continuous     07/01/18 0040        Code Status History    Date Active Date Inactive Code Status Order ID Comments User Context   06/30/2018 2251 07/01/2018 0040 Full Code [id number]   Toy Baker, MD ED   11/29/2013 1958 11/30/2013 1340 Full Code [id number]  Adrian Prows, MD Inpatient   11/29/2013 1704 11/29/2013 1958 Full Code [id number]  Adrian Prows, MD ED   09/02/2013 1708 09/03/2013 1908 Full Code [id number]  Penelope Coop Inpatient   08/02/2012 1639 08/04/2012 1709 Full Code [id number]  Erlene Senters, PA-C Inpatient        IV Access:    Peripheral IV   Procedures and diagnostic studies:   No results found.   Medical Consultants:    None.  Anti-Infectives:   None  Subjective:    [name] [name] is in a very good mood this morning, she relates her breathing is so much improved compared to yesterday, her appetite has returned.  Objective:    Vitals:   07/12/18 0300 07/12/18 0335 07/12/18 0400 07/12/18 0805  BP: (!) 142/72 (!) 144/70 (!) 151/79   Pulse: 61  66   Resp: 20  20   Temp:    98.3 F (36.8 C)  TempSrc:    Oral  SpO2: 95%  95%   Weight:      Height:        Intake/Output Summary (Last 24 hours) at 07/12/2018 0811 Last data filed at 07/12/2018 0600 Gross per 24 hour  Intake 480 ml  Output 800 ml  Net -320 ml   Filed Weights   06/30/18 1715 07/01/18 1415  Weight: 110 kg 112.5 kg    Exam: General exam: In no acute distress. Respiratory system: Good air movement and clear to auscultation Cardiovascular  system: Regular rate and rhythm with positive S1 and S2. Gastrointestinal system: Bowel sounds soft nontender nondistended. Central nervous system: Awake alert and oriented x3 nonfocal Extremities: Trace lower extremity edema. Skin: No rashes. Psychiatry: Patient in a joyful mood judgment insight appear normal.   Data Reviewed:    Labs: Basic Metabolic Panel: Recent Labs  Lab 07/06/18 0558 07/07/18 0230 07/08/18 0500 07/09/18 0457  NA 143 142 140 138  K 3.5 3.7 3.4* 3.7  CL 99 98 98 98  CO2 31 30 33* 29  GLUCOSE 234* 202* 144* 134*  BUN 40* 41* 39* 33*  CREATININE 0.88 0.94 0.80 0.80  CALCIUM 8.7*  8.8* 9.1 8.8*  MG 2.2 2.1  --   --   PHOS 4.1 3.6  --   --    GFR Estimated Creatinine Clearance: 85 mL/min (by C-G formula based on SCr of 0.8 mg/dL). Liver Function Tests: No results for input(s): AST, ALT, ALKPHOS, BILITOT, PROT, ALBUMIN in the last 168 hours. No results for input(s): LIPASE, AMYLASE in the last 168 hours. No results for input(s): AMMONIA in the last 168 hours. Coagulation profile No results for input(s): INR, PROTIME in the last 168 hours. COVID-19 Labs  Recent Labs    07/10/18 0321  DDIMER 1.50*  CRP <0.8    No results found for: SARSCOV2NAA  CBC: Recent Labs  Lab 07/06/18 0558 07/07/18 0230 07/09/18 0457  WBC 15.8* 17.1* 15.7*  HGB 12.8 12.9 13.3  HCT 39.9 38.7 41.4  MCV 91.1 89.6 89.2  PLT 322 318 334   Cardiac Enzymes: No results for input(s): CKTOTAL, CKMB, CKMBINDEX, TROPONINI in the last 168 hours. BNP (last 3 results) No results for input(s): PROBNP in the last 8760 hours. CBG: Recent Labs  Lab 07/11/18 0817 07/11/18 0849 07/11/18 1201 07/11/18 1552 07/11/18 2117  GLUCAP 53* 102* 184* 157* 204*   D-Dimer: Recent Labs    07/10/18 0321  DDIMER 1.50*   Hgb A1c: No results for input(s): HGBA1C in the last 72 hours. Lipid Profile: No results for input(s): CHOL, HDL, LDLCALC, TRIG, CHOLHDL, LDLDIRECT in the last 72 hours. Thyroid function studies: No results for input(s): TSH, T4TOTAL, T3FREE, THYROIDAB in the last 72 hours.  Invalid input(s): FREET3 Anemia work up: No results for input(s): VITAMINB12, FOLATE, FERRITIN, TIBC, IRON, RETICCTPCT in the last 72 hours. Sepsis Labs: Recent Labs  Lab 07/06/18 0558 07/07/18 0230 07/09/18 0457  WBC 15.8* 17.1* 15.7*   Microbiology No results found for this or any previous visit (from the past 240 hour(s)).   Medications:   . amLODipine  10 mg Oral q morning - 10a  . atorvastatin  80 mg Oral QPM  . chlorhexidine  15 mL Mouth Rinse BID  . Chlorhexidine Gluconate Cloth  6  each Topical Q0600  . chlorthalidone  25 mg Oral q morning - 10a  . enoxaparin (LOVENOX) injection  55 mg Subcutaneous Q12H  . escitalopram  20 mg Oral QPM  . insulin aspart  0-15 Units Subcutaneous TID WC  . insulin aspart  0-5 Units Subcutaneous QHS  . insulin aspart  6 Units Subcutaneous TID WC  . insulin glargine  50 Units Subcutaneous BID  . mouth rinse  15 mL Mouth Rinse q12n4p  . pantoprazole  40 mg Oral Daily  . sodium chloride flush  3 mL Intravenous Q12H  . vitamin C  500 mg Oral Daily  . zinc sulfate  220 mg Oral Daily   Continuous Infusions: . sodium chloride Stopped (07/06/18  2330)      LOS: 12 days   [facility]  07/12/2018, 8:11 AM

## 2018-07-12 NOTE — Progress Notes (Signed)
SATURATION QUALIFICATIONS: (This note is used to comply with regulatory documentation for home oxygen)  Patient Saturations on Room Air at Rest = 88-92%  Patient Saturations on Room Air while Ambulating = 74%  Patient Saturations on 2 Liters of oxygen while Ambulating = 92%  Please briefly explain why patient needs home oxygen:  Pt has significant desaturation and shortness of breath while  ambulating on room air.

## 2018-07-12 NOTE — Progress Notes (Signed)
Occupational Therapy Treatment Patient Details Name: [name] [name] MRN: [id number] DOB: 04-Apr-1950 Today's Date: 07/12/2018    History of present illness 68 y.o. female admitted on 06/30/18 with acute respiratory failure with hypoxemia secondary to COVID-19.  Pt with significant PMH of DM2, OSA on CPAP at night, HTN, CAD, R wrist carpal tunnel s/p release, L TKA, and R THA.     OT comments  Making excellent progress. Completed bathroom mobility and hygiene after toileting on 2L followed by ambulation in hall @ 200 ft with S @ RW level on 3L. Pt desats to 84 but quickly rebounds to 93 with standing rest break. Will continue to follow acutely to facilitate safe DC home.   Follow Up Recommendations  No OT follow up;Supervision - Intermittent    Equipment Recommendations  None recommended by OT    Recommendations for Other Services      Precautions / Restrictions Precautions Precautions: Other (comment)(monitor )2`)       Mobility Bed Mobility               General bed mobility comments: Pt was OOB in the recliner chair.   Transfers Overall transfer level: Needs assistance Equipment used: Rolling walker (2 wheeled) Transfers: Sit to/from [facility] Sit to Stand: Supervision Stand pivot transfers: Supervision            Balance     Sitting balance-Leahy Scale: Good       Standing balance-Leahy Scale: Fair                             ADL either performed or assessed with clinical judgement   ADL                           Toilet Transfer: Supervision/safety;RW   Toileting- Clothing Manipulation and Hygiene: Supervision/safety       Functional mobility during ADLs: Supervision/safety;Rolling walker General ADL Comments: Completed bathing earlier with min A. Issued long handled sponge and reacher this pm to assist with ADL as pt has difficultywith LB ADL due to limitations R knee and body habitus.      Vision        Perception     Praxis      Cognition Arousal/Alertness: Awake/alert Behavior During Therapy: WFL for tasks assessed/performed Overall Cognitive Status: Within Functional Limits for tasks assessed                                          Exercises Other Exercises Other Exercises: completing incentive spirometer Other Exercises: has been working on her HEP   Shoulder Instructions       General Comments      Pertinent Vitals/ Pain       Pain Assessment: No/denies pain  Home Living                                          Prior Functioning/Environment              Frequency  Min 3X/week        Progress Toward Goals  OT Goals(current goals can now be found in the care plan section)  Progress towards OT goals: Progressing toward  goals  Acute Rehab OT Goals Patient Stated Goal: to see her grand children and get better OT Goal Formulation: With patient Time For Goal Achievement: 07/25/18 Potential to Achieve Goals: Good ADL Goals Pt Will Perform Lower Body Bathing: with modified independence;with adaptive equipment;sit to/from stand Pt Will Perform Lower Body Dressing: with modified independence;sit to/from stand;with adaptive equipment Pt Will Transfer to Toilet: with modified independence;ambulating;regular height toilet Pt Will Perform Toileting - Clothing Manipulation and hygiene: with modified independence;sitting/lateral leans Pt/caregiver will Perform Home Exercise Program: Increased strength;Both right and left upper extremity;With written HEP provided;With theraband;Independently Additional ADL Goal #1: Pt will independently verbalize 3 energy conservation strategies to increase activity tolerance for ADL and IADL tasks.  Plan Discharge plan remains appropriate    Co-evaluation                 AM-PAC OT "6 Clicks" Daily Activity     Outcome Measure   Help from another person eating meals?: None Help from  another person taking care of personal grooming?: None Help from another person toileting, which includes using toliet, bedpan, or urinal?: A Little Help from another person bathing (including washing, rinsing, drying)?: A Little Help from another person to put on and taking off regular upper body clothing?: None Help from another person to put on and taking off regular lower body clothing?: A Little 6 Click Score: 21    End of Session Equipment Utilized During Treatment: Oxygen(3L during mobility)  OT Visit Diagnosis: Unsteadiness on feet (R26.81);Muscle weakness (generalized) (M62.81)   Activity Tolerance Patient tolerated treatment well   Patient Left in chair;with call bell/phone within reach   Nurse Communication Mobility status        Time: 5188-4166 OT Time Calculation (min): 30 min  Charges: OT General Charges $OT Visit: 1 Visit OT Treatments $Self Care/Home Management : 23-37 mins  Maurie Boettcher, OT/L   Acute OT Clinical Specialist [place] Pager [phone] Office [phone]    [facility] 07/12/2018, 4:55 PM

## 2018-07-12 NOTE — Progress Notes (Signed)
Physical Therapy Treatment Patient Details Name: [name] [name] MRN: [id number] DOB: 10/25/1950 Today's Date: 07/12/2018    History of Present Illness 68 y.o. female admitted on 06/30/18 with acute respiratory failure with hypoxemia secondary to COVID-19.  Pt with significant PMH of DM2, OSA on CPAP at night, HTN, CAD, R wrist carpal tunnel s/p release, L TKA, and R THA.      PT Comments    Patient eager to ambulate and began walk in hallway at a quick pace while talking. On 3L [place] O2 she desaturated to 83% after 40 feet. Recovered to 93% with standing break with vc for pursed lip breathing and limit her talking. Completed final 110 ft on 3L with SaO2 >90% with slower pace and no talking. Patient reports this was her 3rd walk in the halls today! She demonstrated good technique with LE exercises (although did require cues to again slow her pace).     Follow Up Recommendations  No PT follow up     Equipment Recommendations  None recommended by PT;Other (comment)(possible home O2)    Recommendations for Other Services       Precautions / Restrictions Precautions Precautions: Other (comment) Precaution Comments: monitor sats Restrictions Weight Bearing Restrictions: No    Mobility  Bed Mobility               General bed mobility comments: Pt was OOB in the recliner chair.   Transfers Overall transfer level: Needs assistance Equipment used: None Transfers: Sit to/from Stand Sit to Stand: Supervision Stand pivot transfers: Supervision          Ambulation/Gait Ambulation/Gait assistance: Min guard Gait Distance (Feet): 150 Feet Assistive device: None Gait Pattern/deviations: WFL(Within Functional Limits)     General Gait Details: pt initiated gait at quick pace and talking with decline to 83% on 3L after 40 ft; recovered to 93% in standing with pursed lip breathing; instructed in no talking and slower pace and pt remained >90% remainder of walk    Stairs              Wheelchair Mobility    Modified Rankin (Stroke Patients Only)       Balance     Sitting balance-Leahy Scale: Good       Standing balance-Leahy Scale: Fair                              Cognition Arousal/Alertness: Awake/alert Behavior During Therapy: WFL for tasks assessed/performed Overall Cognitive Status: Within Functional Limits for tasks assessed                                        Exercises General Exercises - Lower Extremity Ankle Circles/Pumps: AROM;Both;10 reps Long Arc Quad: AROM;Both;10 reps Hip Flexion/Marching: AROM;Both;10 reps;Seated;Standing Other Exercises Other Exercises: completing incentive spirometer Other Exercises: has been working on her HEP    General Comments        Pertinent Vitals/Pain Pain Assessment: No/denies pain    Home Living                      Prior Function            PT Goals (current goals can now be found in the care plan section) Acute Rehab PT Goals Patient Stated Goal: to get home, but protect her family from getting  sick. Time For Goal Achievement: 07/23/18 Progress towards PT goals: Progressing toward goals    Frequency    Min 5X/week      PT Plan Current plan remains appropriate    Co-evaluation              AM-PAC PT "6 Clicks" Mobility   Outcome Measure  Help needed turning from your back to your side while in a flat bed without using bedrails?: None Help needed moving from lying on your back to sitting on the side of a flat bed without using bedrails?: None Help needed moving to and from a bed to a chair (including a wheelchair)?: A Little Help needed standing up from a chair using your arms (e.g., wheelchair or bedside chair)?: None Help needed to walk in hospital room?: A Little Help needed climbing 3-5 steps with a railing? : A Little 6 Click Score: 21    End of Session Equipment Utilized During Treatment: Oxygen(6-8 L  [facility]NC) Activity Tolerance: Treatment limited secondary to medical complications (Comment)(desaturation initially) Patient left: in chair;with call bell/phone within reach;with nursing/sitter in room Nurse Communication: Mobility status(no longer needs RW) PT Visit Diagnosis: Muscle weakness (generalized) (M62.81);Difficulty in walking, not elsewhere classified (R26.2)     Time: 4734-0370 PT Time Calculation (min) (ACUTE ONLY): 26 min  Charges:  $Gait Training: 23-37 mins                        [employer], PT 07/12/2018, 5:13 PM

## 2018-07-12 NOTE — TOC Initial Note (Signed)
Transition of Care [facility]) - Initial/Assessment Note    Patient Details  Name: [name] [name] MRN: [id number] Date of Birth: 10-12-1950  [facility]) CM/SW Contact:    Midge Minium RN, BSN, NCM-BC, ACM-RN [phone] (working remotely) Phone Number: 07/12/2018, 1:40 PM  Clinical Narrative:                 CM spoke to the patient via phone to discuss the POC. 68yo female lives at home with her spouse and was independent with her ADLs PTA, with no AD in use. Patient verified as having active health insurance: Medicare Part B/Medicaid [place] Access; PCP confirmed as: Vicenta Aly, FNP. Patient continue to require oxygen for hypoxia. CM discussed DME preference (if DME is determined prior to transitioning home), with no preference. Patient indicated her spouse can provide transportation and assist home. CM team will continue to follow for progression needs.    Expected Discharge Plan: Home/Self Care Barriers to Discharge: Continued Medical Work up(Continue to require oxygen)   Patient Goals and CMS Choice Patient states their goals for this hospitalization and ongoing recovery are:: "to get home, I feel better" CMS Medicare.gov Compare Post Acute Care list provided to:: Patient Choice offered to / list presented to : Patient  Expected Discharge Plan and Services Expected Discharge Plan: Home/Self Care In-house Referral: NA Discharge Planning Services: CM Consult Post Acute Care Choice: Durable Medical Equipment Living arrangements for the past 2 months: Single Family Home                 DME Arranged: N/A(following for possible home oxygen needs) DME Agency: [place]       HH Arranged: NA [place] Agency: NA        Prior Living Arrangements/Services Living arrangements for the past 2 months: Single Family Home Lives with:: Self, Spouse Patient language and need for interpreter reviewed:: Yes Do you feel safe going back to the place where you live?: Yes       Need for Family Participation in Patient Care: No (Comment) Care giver support system in place?: Yes (comment)   Criminal Activity/Legal Involvement Pertinent to Current Situation/Hospitalization: No - Comment as needed  Activities of Daily Living Home Assistive Devices/Equipment: None ADL Screening (condition at time of admission) Patient's cognitive ability adequate to safely complete daily activities?: Yes Is the patient deaf or have difficulty hearing?: No Does the patient have difficulty seeing, even when wearing glasses/contacts?: No Does the patient have difficulty concentrating, remembering, or making decisions?: No Patient able to express need for assistance with ADLs?: Yes Does the patient have difficulty dressing or bathing?: No Independently performs ADLs?: Yes (appropriate for developmental age) Does the patient have difficulty walking or climbing stairs?: No Weakness of Legs: None Weakness of Arms/Hands: None  Permission Sought/Granted Permission sought to share information with : Case Manager, Customer service manager Permission granted to share information with : Yes, Verbal Permission Granted     Permission granted to share info w AGENCY: DME agency/liaison        Emotional Assessment   Attitude/Demeanor/Rapport: Engaged Affect (typically observed): Accepting, Appropriate, Pleasant, Happy Orientation: : Oriented to Self, Oriented to Situation, Oriented to Place, Oriented to  Time Alcohol / Substance Use: Not Applicable Psych Involvement: No (comment)  Admission diagnosis:  Acute respiratory disease due to COVID-19 virus [U07.1, J06.9] Patient Active Problem List   Diagnosis Date Noted  . Acute respiratory failure with hypoxemia ([place])   . Hypoxemia   . Acute pulmonary  edema ([place])   . Acute respiratory disease due to COVID-19 virus 06/30/2018  . DM (diabetes mellitus), type 2 ([place]) 06/30/2018  . Essential hypertension 06/30/2018  . Right carpal  tunnel syndrome 07/17/2017  . Other fatigue 04/29/2017  . Shortness of breath on exertion 04/29/2017  . Vitamin D deficiency 04/29/2017  . Hypersomnolence 06/04/2016  . Nonalcoholic fatty liver disease without nonalcoholic steatohepatitis ([facility]) 02/12/2016  . Gastroesophageal reflux disease with esophagitis 11/23/2014  . History of endometrial cancer 08/25/2014  . Mixed hyperlipidemia 08/25/2014  . Morbid obesity with alveolar hypoventilation ([place]) 08/25/2014  . Bilateral carotid bruits 11/30/2013  . Dyspnea on exertion 11/30/2013  . Osteoarthritis of right hip 09/02/2013  . Cystocele 07/03/2013  . Vaginal pessary present 04/25/2013  . Prolapse of vaginal vault after hysterectomy 04/25/2013  . Endometrial cancer, grade I ([place]) 04/25/2013  . Unspecified vitamin D deficiency 04/25/2013  . Personal history of other medical treatment 04/25/2013  . Osteoarthritis of left knee 08/02/2012  . Stress incontinence 06/07/2012  . Essential hypertension with goal blood pressure less than 130/85 06/07/2012  . Diabetes ([place]) 06/07/2012  . Obstructive sleep apnea 02/16/2012   PCP:  Vicenta Aly, [place] Pharmacy:   [facility] [place], [place] - [name] [place] [name] [place] [address] Phone: [phone] Fax: [phone]     Social Determinants of Health (SDOH) Interventions    Readmission Risk Interventions No flowsheet data found.

## 2018-07-12 NOTE — Progress Notes (Signed)
Placed patient on [place] for HS use. Poistive pressure at 20L  F35% Fio2.   Spo2 sustaining at 97%.  RN aware.

## 2018-07-13 LAB — GLUCOSE, CAPILLARY
Glucose-Capillary: 60 mg/dL — ABNORMAL LOW (ref 70–99)
Glucose-Capillary: 187 mg/dL — ABNORMAL HIGH (ref 70–99)

## 2018-07-13 NOTE — TOC Transition Note (Addendum)
Transition of Care [facility]) - CM/SW Discharge Note   Patient Details  Name: [name] [name] MRN: [id number] Date of Birth: [birth year]  Transition of Care [facility]) CM/SW Contact:  Midge Minium RN, BSN, NCM-BC, ACM-RN [phone] Phone Number: 07/13/2018, 2:05 PM   Clinical Narrative:    Patient will be transitioning home today with home O2. CM contacted Learta Codding, [place] liaison, to discuss the referral; AVS updated. Patients home oxygen concentrator has been delivered to her home.CM spoke to [place], [place], RN, with a portable oxygen tank to be delivered to the patient from [place], prior to the patient transitioning home. No further needs from CM.      Barriers to Discharge: No Barriers Identified   Patient Goals and CMS Choice Patient states their goals for this hospitalization and ongoing recovery are:: "to get home, I feel better" CMS Medicare.gov Compare Post Acute Care list provided to:: Patient Choice offered to / list presented to : Patient   Discharge Plan and Services In-house Referral: NA Discharge Planning Services: CM Consult Post Acute Care Choice: Durable Medical Equipment          DME Arranged: N/A(following for possible home oxygen needs) DME Agency: [place] Date DME Agency Contacted: 07/13/18 Time DME Agency Contacted: [phone] Representative spoke with at DME Agency: Magda Paganini Rose-liaison HH Arranged: NA [place] Agency: NA        Social Determinants of Health ([place]) Interventions     Readmission Risk Interventions No flowsheet data found.

## 2018-07-13 NOTE — Progress Notes (Signed)
Physical Therapy Treatment Patient Details Name: MILLICENT BLAZEJEWSKI MRN: [id number] DOB: 13-Apr-1950 Today's Date: 07/13/2018    History of Present Illness 68 y.o. female admitted on 06/30/18 with acute respiratory failure with hypoxemia secondary to COVID-19.  Pt with significant PMH of DM2, OSA on CPAP at night, HTN, CAD, R wrist carpal tunnel s/p release, L TKA, and R THA.      PT Comments    Patient eager to mobilize (as per her usual). Continues to require vc for slowing pace, limiting talking, and how to manage O2 tubing while mobilizing in room. SaO2 on 3L while ambulating 87-91% with 3 standing rest breaks over 250 ft.     Follow Up Recommendations  No PT follow up     Equipment Recommendations  None recommended by PT;Other (comment)(possible home O2)    Recommendations for Other Services       Precautions / Restrictions Precautions Precautions: Other (comment) Precaution Comments: monitor sats Restrictions Weight Bearing Restrictions: No    Mobility  Bed Mobility               General bed mobility comments: Pt was OOB in the recliner chair.   Transfers Overall transfer level: Needs assistance Equipment used: None Transfers: Sit to/from Stand Sit to Stand: Supervision(vc for safety with O2 tubing)            Ambulation/Gait Ambulation/Gait assistance: Min guard Gait Distance (Feet): 250 Feet(standing rest for incr SaO2 x 3) Assistive device: None Gait Pattern/deviations: WFL(Within Functional Limits) Gait velocity: vc to slow velocity; did not note any decreased balance with slow velocity   General Gait Details: pt able to verbalize that she should walk at slow pace and not talk to maintain SaO2, however she required frequent cues for both while ambulating.Declined to 87% on 3L after 75 ft; recovered to 91% in standing with pursed lip breathing;    Stairs             Wheelchair Mobility    Modified Rankin (Stroke Patients Only)        Balance                                            Cognition Arousal/Alertness: Awake/alert Behavior During Therapy: WFL for tasks assessed/performed Overall Cognitive Status: Within Functional Limits for tasks assessed                                        Exercises General Exercises - Lower Extremity Ankle Circles/Pumps: AROM;Both;10 reps(for warm-up as PT prepared equipment to walk) Long Arc Quad: AROM;Both;10 reps(for warm-up as PT prepared equipment to walk) Hip Flexion/Marching: AROM;Both;10 reps;Seated;Standing(for warm-up as PT prepared equipment to walk)    General Comments        Pertinent Vitals/Pain      Home Living                      Prior Function            PT Goals (current goals can now be found in the care plan section) Acute Rehab PT Goals Patient Stated Goal: to get home, but protect her family from getting sick. Time For Goal Achievement: 07/23/18 Progress towards PT goals: Progressing toward goals    Frequency  Min 5X/week      PT Plan Current plan remains appropriate    Co-evaluation              AM-PAC PT "6 Clicks" Mobility   Outcome Measure  Help needed turning from your back to your side while in a flat bed without using bedrails?: None Help needed moving from lying on your back to sitting on the side of a flat bed without using bedrails?: None Help needed moving to and from a bed to a chair (including a wheelchair)?: A Little Help needed standing up from a chair using your arms (e.g., wheelchair or bedside chair)?: None Help needed to walk in hospital room?: A Little Help needed climbing 3-5 steps with a railing? : A Little 6 Click Score: 21    End of Session Equipment Utilized During Treatment: Oxygen(3L) Activity Tolerance: Treatment limited secondary to medical complications (Comment)(desaturation initially) Patient left: in chair;with call bell/phone within reach   PT  Visit Diagnosis: Muscle weakness (generalized) (M62.81);Difficulty in walking, not elsewhere classified (R26.2)     Time: 8022-3361 PT Time Calculation (min) (ACUTE ONLY): 26 min  Charges:  $Gait Training: 23-37 mins                        [employer], PT 07/13/2018, 10:03 AM

## 2018-07-13 NOTE — Progress Notes (Signed)
Pt BG 60 this morning. She was asymptomatic. She drank some juice and ate breakfast. BG recheck 117.

## 2018-07-13 NOTE — Discharge Summary (Signed)
Physician Discharge Summary  [name] [name]:[id number] DOB: 1951/01/28 DOA: 06/30/2018  PCP: Vicenta Aly, FNP  Admit date: 06/30/2018 Discharge date: 07/13/2018  Admitted From: Home Disposition:  Home  Recommendations for Outpatient Follow-up:  1. Follow up with PCP in 1-2 weeks 2. Please obtain BMP/CBC in one week   Home Health:No Equipment/Devices: We will go home on 2 L of oxygen for the next 2 weeks.   Discharge Condition:Stable CODE STATUS:Full Diet recommendation: Heart Healthy  Brief/Interim Summary: 68 y.o. female past medical history of essential hypertension, diabetes mellitus type 2, chronic diastolic heart failure who tested positive for SARS [place] to 1 week prior to presentation, transferred to [facility] for acute respiratory failure with hypoxia  Discharge Diagnoses:  Active Problems:   Obstructive sleep apnea   Mixed hyperlipidemia   Morbid obesity with alveolar hypoventilation (HCC)   Acute respiratory disease due to COVID-19 virus   DM (diabetes mellitus), type 2 (HCC)   Essential hypertension   Acute respiratory failure with hypoxemia (HCC)   Hypoxemia   Acute pulmonary edema (HCC) Acute respiratory failure with hypoxia due to SARS-CoV-2 pneumonia: She was admitted to the ICU as her respiration were tenuous and she was on 15 L of oxygen. She completed a course of IV steroids and convalescent plasma, as well as 2 doses of Actemra Patient was very slow to improve.  The day before discharge she was off oxygen throughout the day she was ambulated and her oxygen saturations dropped to 76% on ambulation. She will be sent home on 2 L of oxygen for the next 2 weeks to follow-up with her primary care doctor at that point.  Chronic diastolic heart failure: She appears to be euvolemic, no changes made to her medication she will restart them as an outpatient.  Essential hypertension: No changes made to her medication.  Diabetes mellitus  type 2: Blood glucose remained stable no changes made to her medication continue current home regimen.   Discharge Instructions  Discharge Instructions    Diet - low sodium heart healthy   Complete by:  As directed    Increase activity slowly   Complete by:  As directed      Allergies as of 07/13/2018      Reactions   Codeine Palpitations      Medication List    TAKE these medications   Accu-Chek Aviva Plus test strip Generic drug:  glucose blood U UTD   Accu-Chek Softclix Lancets lancets U UTD   amLODipine 10 MG tablet Commonly known as:  NORVASC Take 1 tablet (10 mg total) by mouth daily. What changed:  when to take this   atorvastatin 80 MG tablet Commonly known as:  LIPITOR Take 1 tablet by mouth every evening.   benazepril 40 MG tablet Commonly known as:  LOTENSIN Take 40 mg by mouth every evening.   bisoprolol 10 MG tablet Commonly known as:  ZEBETA TAKE 1 TABLET BY MOUTH DAILY   cetirizine 10 MG tablet Commonly known as:  ZYRTEC Take 10 mg by mouth at bedtime.   chlorthalidone 25 MG tablet Commonly known as:  HYGROTON Take 25 mg by mouth every morning. 1 table once daily   escitalopram 20 MG tablet Commonly known as:  LEXAPRO Take 20 mg by mouth every evening.   glimepiride 4 MG tablet Commonly known as:  AMARYL Take 2 mg by mouth daily with breakfast.   linagliptin 5 MG Tabs tablet Commonly known as:  TRADJENTA Take 1 tablet (  5 mg total) by mouth every morning.   meloxicam 15 MG tablet Commonly known as:  MOBIC Take 15 mg by mouth every morning.   metFORMIN 1000 MG tablet Commonly known as:  GLUCOPHAGE Take 1 tablet (1,000 mg total) by mouth 2 (two) times daily with a meal.   multivitamin tablet Take 1 tablet by mouth daily.   omeprazole 20 MG capsule Commonly known as:  PRILOSEC Take 20 mg by mouth every morning.   ondansetron 4 MG tablet Commonly known as:  Zofran Take 1 tablet (4 mg total) by mouth every 8 (eight) hours as  needed for nausea or vomiting.   Vitamin D (Ergocalciferol) 1.25 MG (50000 UT) Caps capsule Commonly known as:  DRISDOL Take 1 capsule (50,000 Units total) by mouth every 3 (three) days.            Durable Medical Equipment  (From admission, onward)         Start     Ordered   07/13/18 0854  For home use only DME oxygen  Once    Question Answer Comment  Mode or (Route) Nasal cannula   Liters per Minute 2   Frequency Continuous (stationary and portable oxygen unit needed)   Oxygen conserving device Yes   Oxygen delivery system Gas      07/13/18 0853         Follow-up [facility] Follow up.   Why:  Home Oxygen Contact information: [place] [address] [phone]          Allergies  Allergen Reactions  . Codeine Palpitations    Consultations:  PCCM   Procedures/Studies: Dg Abd 1 View  Result Date: 07/04/2018 CLINICAL DATA:  Abdominal pain EXAM: ABDOMEN - 1 VIEW COMPARISON:  06/30/2018 chest x-ray FINDINGS: Bibasilar opacities with central air bronchograms in the lower lobes, worse on the right concerning for basilar pneumonia. Scattered air throughout the bowel without significant obstruction pattern, dilatation or ileus. Remote cholecystectomy. Degenerative changes of the spine. Right hip hardware partially imaged. Vascular calcifications noted. IMPRESSION: Nonobstructive bowel gas pattern. Bibasilar airspace process. Electronically Signed   By: Jerilynn Mages.  Shick M.D.   On: 07/04/2018 10:30   Dg Chest Port 1 View  Result Date: 07/05/2018 CLINICAL DATA:  Hypoxemia. EXAM: PORTABLE CHEST 1 VIEW COMPARISON:  Radiographs of Jul 03, 2018. FINDINGS: Stable cardiomediastinal silhouette. No pneumothorax is noted. Mildly decreased bilateral lung opacities are noted suggesting improving pneumonia. Minimal pleural effusions may be present. Bony thorax is unremarkable. IMPRESSION: Mildly improved bilateral pneumonia. Electronically  Signed   By: Marijo Conception M.D.   On: 07/05/2018 09:23   Dg Chest Port 1 View  Result Date: 07/03/2018 CLINICAL DATA:  Acute respiratory failure with hypoxemia. EXAM: PORTABLE CHEST 1 VIEW COMPARISON:  Radiograph Jul 01, 2018. FINDINGS: Stable cardiomediastinal silhouette. No pneumothorax is noted. Increased diffuse bilateral lung opacities are noted concerning for pneumonia. No definite pleural effusion is noted. Bony thorax is unremarkable. IMPRESSION: Increased diffuse bilateral lung opacities are noted most consistent with worsening pneumonia. Electronically Signed   By: Marijo Conception M.D.   On: 07/03/2018 09:07   Dg Chest Port 1 View  Result Date: 06/30/2018 CLINICAL DATA:  Shortness of breath EXAM: PORTABLE CHEST 1 VIEW COMPARISON:  11/28/2013 FINDINGS: The cardiac silhouette is enlarged. There is diffuse bilateral airspace opacities. No pneumothorax. Trace bilateral pleural effusions are suspected. There is no definite acute osseous abnormality. IMPRESSION: 1. Diffuse bilateral pulmonary airspace opacities  concerning for multifocal pneumonia (viral or bacterial). Pulmonary edema can have a similar appearance. 2.  Mild cardiomegaly. Electronically Signed   By: Constance Holster M.D.   On: 06/30/2018 18:44   Dg Chest Port 1v Same Day  Result Date: 07/01/2018 CLINICAL DATA:  68 year old female with shortness of breath EXAM: PORTABLE CHEST 1 VIEW COMPARISON:  06/30/2018 FINDINGS: Cardiomediastinal silhouette unchanged in size and contour. Patchy airspace and interstitial opacities the bilateral lungs, similar to the comparison. No large pleural effusion or pneumothorax. No displaced fracture. IMPRESSION: Similar appearance of the chest x-ray with mixed interstitial and airspace opacities bilaterally. Electronically Signed   By: Corrie Mckusick D.O.   On: 07/01/2018 09:52     Subjective: She relates she feels great no new complaints.  Discharge Exam: Vitals:   07/13/18 0940 07/13/18 1123  BP:   (!) 158/87  Pulse: 81 80  Resp:  (!) 22  Temp:  98.2 F (36.8 C)  SpO2: 93% 96%     General: Pt is alert, awake, not in acute distress Cardiovascular: RRR, S1/S2 +, no rubs, no gallops Respiratory: CTA bilaterally, no wheezing, no rhonchi Abdominal: Soft, NT, ND, bowel sounds + Extremities: no edema, no cyanosis    The results of significant diagnostics from this hospitalization (including imaging, microbiology, ancillary and laboratory) are listed below for reference.     Microbiology: No results found for this or any previous visit (from the past 240 hour(s)).   Labs: BNP (last 3 results) No results for input(s): BNP in the last 8760 hours. Basic Metabolic Panel: Recent Labs  Lab 07/07/18 0230 07/08/18 0500 07/09/18 0457  NA 142 140 138  K 3.7 3.4* 3.7  CL 98 98 98  CO2 30 33* 29  GLUCOSE 202* 144* 134*  BUN 41* 39* 33*  CREATININE 0.94 0.80 0.80  CALCIUM 8.8* 9.1 8.8*  MG 2.1  --   --   PHOS 3.6  --   --    Liver Function Tests: No results for input(s): AST, ALT, ALKPHOS, BILITOT, PROT, ALBUMIN in the last 168 hours. No results for input(s): LIPASE, AMYLASE in the last 168 hours. No results for input(s): AMMONIA in the last 168 hours. CBC: Recent Labs  Lab 07/07/18 0230 07/09/18 0457  WBC 17.1* 15.7*  HGB 12.9 13.3  HCT 38.7 41.4  MCV 89.6 89.2  PLT 318 334   Cardiac Enzymes: No results for input(s): CKTOTAL, CKMB, CKMBINDEX, TROPONINI in the last 168 hours. BNP: Invalid input(s): POCBNP CBG: Recent Labs  Lab 07/12/18 1104 07/12/18 1529 07/12/18 2059 07/13/18 0756 07/13/18 1124  GLUCAP 226* 138* 177* 60* 187*   D-Dimer No results for input(s): DDIMER in the last 72 hours. Hgb A1c No results for input(s): HGBA1C in the last 72 hours. Lipid Profile No results for input(s): CHOL, HDL, LDLCALC, TRIG, CHOLHDL, LDLDIRECT in the last 72 hours. Thyroid function studies No results for input(s): TSH, T4TOTAL, T3FREE, THYROIDAB in the last 72  hours.  Invalid input(s): FREET3 Anemia work up No results for input(s): VITAMINB12, FOLATE, FERRITIN, TIBC, IRON, RETICCTPCT in the last 72 hours. Urinalysis    Component Value Date/Time   COLORURINE YELLOW 05/24/2017 [place] 05/24/2017 1447   LABSPEC 1.010 05/24/2017 1447   PHURINE 5.5 05/24/2017 [place] 05/24/2017 1447   HGBUR NEGATIVE 05/24/2017 1447   BILIRUBINUR NEGATIVE 05/24/2017 1447   BILIRUBINUR neg 07/16/2015 0901   KETONESUR NEGATIVE 05/24/2017 1447   PROTEINUR NEGATIVE 05/24/2017 1447   UROBILINOGEN  negative 07/16/2015 0901   UROBILINOGEN 0.2 11/16/2006 1354   NITRITE NEGATIVE 05/24/2017 1447   LEUKOCYTESUR NEGATIVE 05/24/2017 1447   Sepsis Labs Invalid input(s): PROCALCITONIN,  WBC,  LACTICIDVEN Microbiology No results found for this or any previous visit (from the past 240 hour(s)).   Time coordinating discharge: 40 minutes  SIGNED:   Charlynne Cousins, MD  [facility]

## 2018-07-13 NOTE — Progress Notes (Signed)
Pt taken off HFNC and placed on [place] at 3L. No complications, no increased WOB, Pt denies SOB. VS within normal limits. RT will cotninue to monitor

## 2018-07-13 NOTE — Discharge Instructions (Signed)
Protect yourself when caring for someone who is sick people arrows icon Limit contact COVID-19 spreads between people who are in close contact (within about 6 feet) through respiratory droplets, created when someone talks, coughs or sneezes. The caregiver, when possible, should not be someone who is at higher risk for severe illness from COVID-19. Use a separate bedroom and bathroom. If possible, have the person who is sick stay in their own sick room or area and away from others. If possible, have the person who is sick use a separate bathroom. Shared space: If you have to share space, make sure the room has good air flow.  Open the window and turn on a fan (if possible) to increase air circulation. Improving ventilation helps remove respiratory droplets from the air. Avoid having visitors. Avoid having any unnecessary visitors, especially visits by people who are at higher risk for severe illness.  food icon Eat in separate rooms or areas Stay separated: The person who is sick should eat (or be fed) in their room, if possible. Wash dishes and utensils using gloves and hot water: Handle any dishes, cups/glasses, or silverware used by the person who is sick with gloves. Wash them with soap and hot water or in a dishwasher. Clean hands after taking off gloves or handling used items. no icon Avoid sharing personal items Do not share: Do not share dishes, cups/glasses, silverware, towels, bedding, or electronics (like a cell phone) with the person who is sick. head side mask icon When to wear a cloth face cover or gloves Sick person:  The person who is sick should wear a cloth face covering when they are around other people at home and out (including before they enter a doctors office). The cloth face covering helps prevent a person who is sick from spreading the virus to others. It keeps respiratory droplets contained and from reaching other people. Cloth face coverings should not be placed  on young children under age 64, anyone who has trouble breathing, or is not able to remove the covering without help. Caregiver:  Wear gloves when you touch or have contact with the sick persons blood, stool, or body fluids, such as saliva, mucus, vomit, and urine. Throw out gloves into a lined trash can and wash hands right away. The caregiver should ask the sick person to put on a cloth face covering before entering the room. The caregiver may also wear a cloth face covering when caring for a person who is sick.  To prevent getting sick, make sure you practice everyday preventive actions: clean hands often; avoid touching your eyes, nose, and mouth with unwashed hands; and frequently clean and disinfect surfaces. Note: During the COVID-19 pandemic, medical grade facemasks are reserved for healthcare workers and some first responders. You may need to make a cloth face covering using a scarf or bandana. Learn more here. hands wash icon Clean your hands often Wash hands: Wash your hands often with soap and water for at least 20 seconds. Tell everyone in the home to do the same, especially after being near the person who is sick. Hand sanitizer: If soap and water are not readily available, use a hand sanitizer that contains at least 60% alcohol. Cover all surfaces of your hands and rub them together until they feel dry. Hands off: Avoid touching your eyes, nose, and mouth with unwashed hands. Handwashing tips  cleaning icon Clean and then disinfect Around the house Clean and disinfect high-touch surfaces and items every day: This  includes tables, doorknobs, light switches, handles, desks, toilets, faucets, sinks, and electronics. Clean the area or item with soap and water if it is dirty. Then, use a household disinfectant.  Be sure to follow the instructions on the label to ensure safe and effective use of the product. Many products recommend keeping the surface wet for several minutes to kill  germs. Many also recommend wearing gloves, making sure you have good air flow, and wiping or rinsing off the product after use. Most household disinfectants should be effective. A list of EPA-registered disinfectants can be found here external icon . To clean electronics, follow the manufacturers instructions for all cleaning and disinfection products. If those directions are not available, use alcohol-based wipes or spray containing at least 70% alcohol. Learn more here Bedroom and Bathroom If you are using a separate bedroom and bathroom: Only clean the area around the person who is sick when needed, such as when the area is soiled. This will help limit your contact with the sick person.  If they feel up to it, the person who is sick can clean their own space. Give the person who is sick personal cleaning supplies such as tissues, paper towels, cleaners, and EPA-registered disinfectants external icon . If sharing a bathroom: The person who is sick should clean and then disinfect after each use. If this is not possible, wear a mask and wait as long as possible after the sick person has used the bathroom before coming in to clean and use the bathroom.  washer icon Wash and dry laundry Do not shake dirty laundry. Wear disposable gloves while handling dirty laundry. Dirty laundry from a person who is sick can be washed with other peoples items. Wash items according to the label instructions. Use the warmest water setting you can. Remove gloves, and wash hands right away. Dry laundry, on hot if possible, completely. Wash hands after putting clothes in the dryer. Clean and disinfect clothes hampers. Wash hands afterwards. trash icon Use lined trash can Place used disposable gloves and other contaminated items in a lined trash can. Use gloves when removing garbage bags, and handling and disposing of trash. Wash hands afterwards. Place all used disposable gloves, facemasks, and other  contaminated items in a lined trash can. If possible, dedicate a lined trash can for the person who is sick. digital thermometer icon Track your own health Caregivers and close contacts should monitor their health for COVID-19 symptoms.  Symptoms include fever, cough, and shortness of breath but other symptoms may be present as well. Trouble breathing is a more serious warning sign that you need medical attention. Use CDCs self-checker tool to help you make decisions about seeking appropriate medical care. If you are having trouble breathing, call 911.  Call your doctor or emergency room and tell them your symptoms before going in. They will tell you what to do. How to discontinue home isolation house leave icon People with COVID-19 who have stayed home (home isolated) can leave home under the following conditions**: If they have not had a test to determine if they are still contagious, they can leave home after these three things have happened: They have had no fever for at least 72 hours (that is three full days of no fever without the use of medicine that reduces fevers) AND other symptoms have improved (for example, symptoms of cough or shortness of breath have improved) AND at least 10 days have passed since their symptoms first appeared If they have had  a test to determine if they are still contagious, they can leave home after these three things have happened:  They no longer have a fever (without the use of medicine that reduces fevers) AND other symptoms have improved (for example, symptoms of cough or shortness of breath have improved) AND they have received two negative tests in a row, at least 24 hours apart. Their doctor will follow CDC guidelines. People who DID NOT have COVID-19 symptoms, but tested positive and have stayed home (home isolated) can leave home under the following conditions**: If they have not had a test to determine if they are still contagious, they can  leave home after these two things have happened:  At least 10 days have passed since the date of their first positive test AND they continue to have no symptoms (no cough or shortness of breath) since the test. If they have had a test to determine if they are still contagious, they can leave home after:  They have received two negative tests in a row, at least 24 hours apart. Their doctor will follow CDC guidelines. Note: if they develop symptoms, follow guidance above for people with COVID19 symptoms. For ALL people When leaving the home, keep a distance of 6 feet from others and wear a cloth face covering when around other people. **In all cases, follow the guidance of your doctor and local health department. The decision to stop home isolation should be made in consultation with their healthcare provider and state and local health departments. Some people, for example those with conditions that weaken their immune system, might continue to shed virus even after they recover.

## 2018-07-13 NOTE — Progress Notes (Signed)
Occupational Therapy Treatment Patient Details Name: [name] [name] MRN: [id number] DOB: 09/12/1950 Today's Date: 07/13/2018    History of present illness 68 y.o. female admitted on 06/30/18 with acute respiratory failure with hypoxemia secondary to COVID-19.  Pt with significant PMH of DM2, OSA on CPAP at night, HTN, CAD, R wrist carpal tunnel s/p release, L TKA, and R THA.     OT comments  Completed ADL session at sink level on 2L with SpO2 93-95; HR 85; BP131/65 at end of session. Pt educated on energy conservation strategies and use of AE to increase independence with LB ADL. Pt left on 1L O2. Nsg made aware. Continue to recommend DC home once medically stable.   Follow Up Recommendations  No OT follow up;Supervision - Intermittent    Equipment Recommendations  None recommended by OT    Recommendations for Other Services      Precautions / Restrictions Precautions Precautions: Other (comment) Precaution Comments: monitor sats Restrictions Weight Bearing Restrictions: No       Mobility Bed Mobility               General bed mobility comments: Pt was OOB in the recliner chair.   Transfers Overall transfer level: Modified independent            Balance     Sitting balance-Leahy Scale: Good       Standing balance-Leahy Scale: Fair                             ADL either performed or assessed with clinical judgement   ADL Overall ADL's : Needs assistance/impaired                                     Functional mobility during ADLs: Supervision/safety General ADL Comments: Completed ADL session at sink level with set up. Educated pt on use of AE and energy conservation strateiges during ADL to increase independence and activity tolerance for self care.      Vision       Perception     Praxis      Cognition Arousal/Alertness: Awake/alert Behavior During Therapy: WFL for tasks assessed/performed Overall Cognitive Status:  Within Functional Limits for tasks assessed                                          Exercises   Shoulder Instructions       General Comments      Pertinent Vitals/ Pain       Pain Assessment: No/denies pain  Home Living                                          Prior Functioning/Environment              Frequency  Min 3X/week        Progress Toward Goals  OT Goals(current goals can now be found in the care plan section)  Progress towards OT goals: Progressing toward goals  Acute Rehab OT Goals Patient Stated Goal: to get home, but protect her family from getting sick. OT Goal Formulation: With patient Time For Goal Achievement: 07/25/18 Potential to Achieve  Goals: Good ADL Goals Pt Will Perform Lower Body Bathing: with modified independence;with adaptive equipment;sit to/from stand Pt Will Perform Lower Body Dressing: with modified independence;sit to/from stand;with adaptive equipment Pt Will Transfer to Toilet: with modified independence;ambulating;regular height toilet Pt Will Perform Toileting - Clothing Manipulation and hygiene: with modified independence;sitting/lateral leans Pt/caregiver will Perform Home Exercise Program: Increased strength;Both right and left upper extremity;With written HEP provided;With theraband;Independently Additional ADL Goal #1: Pt will independently verbalize 3 energy conservation strategies to increase activity tolerance for ADL and IADL tasks.  Plan Discharge plan remains appropriate    Co-evaluation                 AM-PAC OT "6 Clicks" Daily Activity     Outcome Measure   Help from another person eating meals?: None Help from another person taking care of personal grooming?: None Help from another person toileting, which includes using toliet, bedpan, or urinal?: None Help from another person bathing (including washing, rinsing, drying)?: A Little Help from another person to put  on and taking off regular upper body clothing?: None Help from another person to put on and taking off regular lower body clothing?: A Little 6 Click Score: 22    End of Session Equipment Utilized During Treatment: Oxygen(2L)  OT Visit Diagnosis: Unsteadiness on feet (R26.81);Muscle weakness (generalized) (M62.81)   Activity Tolerance Patient tolerated treatment well   Patient Left in chair;with call bell/phone within reach   Nurse Communication Mobility status        Time: 9233-0076 OT Time Calculation (min): 36 min  Charges: OT General Charges $OT Visit: 1 Visit OT Treatments $Self Care/Home Management : 23-37 mins  Maurie Boettcher, OT/L   Acute OT Clinical Specialist [place] Pager [phone] Office [phone]    [facility] 07/13/2018, 1:45 PM

## 2018-07-14 ENCOUNTER — Telehealth: Payer: Self-pay

## 2018-07-14 LAB — GLUCOSE, CAPILLARY: Glucose-Capillary: 117 mg/dL — ABNORMAL HIGH (ref 70–99)

## 2018-07-14 NOTE — Telephone Encounter (Signed)
CM attempted to contact the patient for her hospital follow-up call with no answer.   Midge Minium RN, BSN, NCM-BC, ACM-RN [phone]

## 2018-07-23 ENCOUNTER — Telehealth (INDEPENDENT_AMBULATORY_CARE_PROVIDER_SITE_OTHER): Payer: Self-pay | Admitting: Nurse Practitioner

## 2018-07-23 ENCOUNTER — Encounter (INDEPENDENT_AMBULATORY_CARE_PROVIDER_SITE_OTHER): Payer: Self-pay

## 2018-07-23 DIAGNOSIS — J9611 Chronic respiratory failure with hypoxia: Secondary | ICD-10-CM | POA: Diagnosis not present

## 2018-07-23 DIAGNOSIS — Z8619 Personal history of other infectious and parasitic diseases: Secondary | ICD-10-CM | POA: Diagnosis not present

## 2018-07-23 DIAGNOSIS — E119 Type 2 diabetes mellitus without complications: Secondary | ICD-10-CM | POA: Diagnosis not present

## 2018-07-23 NOTE — Telephone Encounter (Signed)
No answer on calling pt - sent mychart msg to see if pt can try to log in to mobile app and complete MYCHART COVID [place]

## 2018-07-27 ENCOUNTER — Telehealth: Payer: Self-pay | Admitting: Pulmonary Disease

## 2018-07-27 NOTE — Telephone Encounter (Signed)
Spoke with patient, she verbalized understanding of TP's recs. She stated that her son had cleaned all of her CPAP supplies prior to her coming home from the hospital.   She has been scheduled for a MyChart Video visit for 07/30/18 at 1130. She is aware of the MyChart video visit process.   Nothing further needed at time of call.

## 2018-07-27 NOTE — Telephone Encounter (Signed)
Called and spoke with Patient.  Patient stated she was admitted to the hospital 5/07-29-17, for Covid. Patient was discharged with home O2, at 1 liter.  Patient stated her Daughter is a RT and her O2 has been checked.  Patient stated she is currently on 1 1/2 liters .  Patient stated her O2 stays in the 90's, but has dropped to 84% while ambulating, and her daughter placed her on 1 1/2 Liters. Patient stated she was told to follow up with pulmonary for hospital follow up.  Patient stated her PCP had advised her to call Pulmonary to advise on continuing home O2. Patient is wanting to know if she can get a new covid test, because she is concerned to be around anyone, since her diagnosis. Patient stated she is feeling better.   Discussed virtual visit with Patient.  Patient stated she is unsure of virtual visit.  Patient stated she could do possible tele visit, with provider.  Message routed to [place], NP, to advise on covid testing, home O2, and OV   Last OV 12/11/17, with TP for OSA  Instructions      Return in about 1 year (around 12/12/2018) for Follow up with Dr. Elsworth Soho.  Continue on CPAP At bedtime   Keep up good work  Work on [employer] loss  Do not drive if sleepy .  Continue on Provigil .  Follow up Dr. Elsworth Soho  Or Parrett NP in 1 year and As needed

## 2018-07-27 NOTE — Telephone Encounter (Signed)
Please set up video visit on Thursday for post hospital follow up  Please use oxygen to keep sats >90% , 1-2 L/m  Will discuss all her issues at ov and testing .  She definitely needs to social distance and stay home for now  Will need to change all home CPAP supplies or disinfect very well  if she used these prior to admission .  She is only 2-3  weeks post discharge /dx so needs to social distance     Please contact office for sooner follow up if symptoms do not improve or worsen or seek emergency care

## 2018-07-30 ENCOUNTER — Encounter: Payer: Self-pay | Admitting: Adult Health

## 2018-07-30 ENCOUNTER — Telehealth: Payer: Self-pay

## 2018-07-30 ENCOUNTER — Telehealth (INDEPENDENT_AMBULATORY_CARE_PROVIDER_SITE_OTHER): Payer: Medicare Other | Admitting: Adult Health

## 2018-07-30 DIAGNOSIS — G4733 Obstructive sleep apnea (adult) (pediatric): Secondary | ICD-10-CM

## 2018-07-30 DIAGNOSIS — J9611 Chronic respiratory failure with hypoxia: Secondary | ICD-10-CM

## 2018-07-30 DIAGNOSIS — J1289 Other viral pneumonia: Secondary | ICD-10-CM | POA: Diagnosis not present

## 2018-07-30 DIAGNOSIS — U071 COVID-19: Secondary | ICD-10-CM

## 2018-07-30 DIAGNOSIS — Z20822 Contact with and (suspected) exposure to covid-19: Secondary | ICD-10-CM

## 2018-07-30 NOTE — Progress Notes (Signed)
Virtual Visit via Video Note  I connected with [name] [name] on 07/30/18 at 11:30 AM EDT by a video enabled telemedicine application and verified that I am speaking with the correct person using two identifiers.  Location: Patient: Home Provider: Office   I discussed the limitations of evaluation and management by telemedicine and the availability of in person appointments. The patient expressed understanding and agreed to proceed.  History of Present Illness: 68 year old female with a known history of obstructive sleep apnea on nocturnal CPAP.  Today tele-visit is a follow-up of recent hospitalization for COVID-19 Patient was admitted May 6 through Jul 13, 2018, she presented with respiratory distress and hypoxemia requiring high levels of oxygen.  SARS-CoV-2 was positive.  She was found to have bilateral infiltrates consistent with a secondary pneumonia.  She was admitted to the ICU requiring 15 L of oxygen.  She was treated aggressively with a course of IV steroids, convalescent plasma and 2 doses of Actemra.  She had a very slow gradual improvement and was able to wean oxygen down to 2 L with activity.  Patient says she initially got a cough loss of smell and fever that just progressively worsened.  Son also tested positive for SARS COV-2  Since discharge patient says she is improving cough and shortness of breath have decreased.  Her smell is starting to return.  She remains on 2 L of oxygen.  Says that she can take it off briefly while sitting around but still has some desaturations when she is moving around or keeps her oxygen off prolonged periods of time.  Her daughter is a respiratory therapist and has been monitoring her closely.  Chest x-ray on Jul 05, 2018 showed improvement with decreased bilateral opacities. Appetite is improving.  No chest pain fever hemoptysis or increased edema She has followed up with her primary care physician and received lab work.  Patient has underlying  sleep apnea is on CPAP at bedtime.  Says she is doing well never misses a night.  She is no longer taking Provigil says it just did not work for her daytime sleepiness.  She says right now is hard to tell if she is sleepy or not as she is fatigued.  Observations/Objective: O2 saturations greater than 90% on 2 L at home checks  Assessment and Plan: SARS-CoV-2 with secondary pneumonia complicated by acute hypoxic respiratory failure.  She required prolonged hospitalization and received aggressive treatment with convalescent plasma, steroids and 2 doses of Actemra patient improved gradually and has been discharged home and seems to be doing well.  Her oxygen demands are lower. She will need follow-up in the office in 2 to 3 weeks with a follow-up chest x-ray.  Prior to this we will need to get a repeat of her SARS-CoV-2 testing to document a negative result prior to return to office.  If this is negative we will follow-up a chest x-ray that day.  Evaluate her oxygen needs. We will look to see if she qualifies for any COVID research studies .    OSA on CPAP  Doing well on nocturnal CPAP  Check download on return   Plan  Patient Instructions  Continue on Oxygen 2l/m .  Activity as tolerated.  Set up for COVID Screen next week .  Follow up in office in 3 weeks with Chest xray  Please contact office for sooner follow up if symptoms do not improve or worsen or seek emergency care   Continue on CPAP At bedtime  Follow Up Instructions: Follow-up in 3 weeks with chest x-ray and as needed    I discussed the assessment and treatment plan with the patient. The patient was provided an opportunity to ask questions and all were answered. The patient agreed with the plan and demonstrated an understanding of the instructions.   The patient was advised to call back or seek an in-person evaluation if the symptoms worsen or if the condition fails to improve as anticipated.  I provided 26  minutes of non-face-to-face time during this encounter.   Rexene Edison, NP

## 2018-07-30 NOTE — Telephone Encounter (Addendum)
Patient called and advised of the request for Covid Testing, appointment scheduled for Monday, 08/02/18 at 1200 at [facility], advised of the location and to wear a mask for everyone in the vehicle, she verbalized understanding. Order placed.   ----- Message from Valerie Salts, [place] sent at 07/30/2018  5:15 PM EDT ----- Please setup patient with COVID testing next week. She tested positive last month and will need a negative test before entering our office in a few weeks. Thanks!

## 2018-07-30 NOTE — Patient Instructions (Addendum)
Continue on Oxygen 2l/m .  Activity as tolerated.  Set up for COVID Screen next week .  Follow up in office in 3 weeks with Chest xray  Please contact office for sooner follow up if symptoms do not improve or worsen or seek emergency care   Continue on CPAP At bedtime

## 2018-08-02 ENCOUNTER — Other Ambulatory Visit: Payer: Medicare Other

## 2018-08-02 DIAGNOSIS — Z20822 Contact with and (suspected) exposure to covid-19: Secondary | ICD-10-CM

## 2018-08-02 DIAGNOSIS — R6889 Other general symptoms and signs: Secondary | ICD-10-CM | POA: Diagnosis not present

## 2018-08-04 LAB — NOVEL CORONAVIRUS, NAA: SARS-CoV-2, NAA: NOT DETECTED

## 2018-08-10 ENCOUNTER — Other Ambulatory Visit (INDEPENDENT_AMBULATORY_CARE_PROVIDER_SITE_OTHER): Payer: Self-pay | Admitting: Physician Assistant

## 2018-08-10 DIAGNOSIS — E559 Vitamin D deficiency, unspecified: Secondary | ICD-10-CM

## 2018-08-19 ENCOUNTER — Other Ambulatory Visit: Payer: Self-pay

## 2018-08-19 DIAGNOSIS — U071 COVID-19: Secondary | ICD-10-CM

## 2018-08-20 ENCOUNTER — Ambulatory Visit (INDEPENDENT_AMBULATORY_CARE_PROVIDER_SITE_OTHER): Payer: Medicare Other | Admitting: Adult Health

## 2018-08-20 ENCOUNTER — Other Ambulatory Visit: Payer: Self-pay

## 2018-08-20 ENCOUNTER — Encounter: Payer: Self-pay | Admitting: Adult Health

## 2018-08-20 ENCOUNTER — Ambulatory Visit (INDEPENDENT_AMBULATORY_CARE_PROVIDER_SITE_OTHER): Payer: Medicare Other

## 2018-08-20 VITALS — BP 140/70 | HR 60 | Ht 63.0 in | Wt 250.0 lb

## 2018-08-20 DIAGNOSIS — J189 Pneumonia, unspecified organism: Secondary | ICD-10-CM | POA: Diagnosis not present

## 2018-08-20 DIAGNOSIS — J1289 Other viral pneumonia: Secondary | ICD-10-CM

## 2018-08-20 DIAGNOSIS — R918 Other nonspecific abnormal finding of lung field: Secondary | ICD-10-CM | POA: Diagnosis not present

## 2018-08-20 DIAGNOSIS — U071 COVID-19: Secondary | ICD-10-CM | POA: Diagnosis not present

## 2018-08-20 DIAGNOSIS — G4733 Obstructive sleep apnea (adult) (pediatric): Secondary | ICD-10-CM | POA: Diagnosis not present

## 2018-08-20 DIAGNOSIS — J9611 Chronic respiratory failure with hypoxia: Secondary | ICD-10-CM | POA: Insufficient documentation

## 2018-08-20 DIAGNOSIS — J1282 Pneumonia due to coronavirus disease 2019: Secondary | ICD-10-CM

## 2018-08-20 NOTE — Assessment & Plan Note (Signed)
Doing well on CPAP  Plan  Patient Instructions  Use Oxygen 2l/m with activity . -Goal is O2 sats >88-90%  Activity as tolerated.  Labs today .  Continue on CPAP @At  bedtime  .  Do not drive if sleepy.  Work on healthy weight loss .  Follow up in 2 months with Dr. Elsworth Soho  Or Parrett NP and As needed   Please contact office for sooner follow up if symptoms do not improve or worsen or seek emergency care

## 2018-08-20 NOTE — Assessment & Plan Note (Signed)
Continue on oxygen with activity Saturation goal is greater than 88 to 90%

## 2018-08-20 NOTE — Patient Instructions (Addendum)
Use Oxygen 2l/m with activity . -Goal is O2 sats >88-90%  Activity as tolerated.  Labs today .  Continue on CPAP @At  bedtime  .  Do not drive if sleepy.  Work on healthy weight loss .  Follow up in 2 months with Dr. Elsworth Soho  Or Sharde Gover NP and As needed   Please contact office for sooner follow up if symptoms do not improve or worsen or seek emergency care

## 2018-08-20 NOTE — Assessment & Plan Note (Addendum)
Clinically patient is much improved. Check cxr  SARS-CoV-2 antibodies lab today   Plan  Patient Instructions  Use Oxygen 2l/m with activity . -Goal is O2 sats >88-90%  Activity as tolerated.  Labs today .  Continue on CPAP @At  bedtime  .  Do not drive if sleepy.  Work on healthy weight loss .  Follow up in 2 months with Dr. Elsworth Soho  Or Jailey Booton NP and As needed   Please contact office for sooner follow up if symptoms do not improve or worsen or seek emergency care

## 2018-08-20 NOTE — Progress Notes (Signed)
@Patient  ID: [name] [name], female    DOB: Jun 04, 1950, 68 y.o.   MRN: [id number]  Chief Complaint  Patient presents with  . Follow-up    COVID 19     Referring provider: Vicenta Aly, FNP  HPI: 68 year old female with a known history of obstructive sleep apnea on nocturnal CPAP.   08/20/18 Follow up : COVID 19 PNA , OSA  Patient presents for a 3-week follow-up.  Patient was recently admitted last month for COVID-19.  She presented with respiratory distress and was admitted May 6 through May 19.  She had significant hypoxemia requiring high levels of oxygen.  She was found to have bilateral infiltrates on chest x-ray.  Consistent with a secondary pneumonia.  She was treated with aggressive IV antibiotics.  She received IV steroids, convalescent plasma and 2 doses of Actemra.  Patient has slowly improved.  She was discharged on oxygen at 2 L with activity.  Patient says that her activity tolerance is improving.  She continues to be somewhat weak.  She denies any increased cough or congestion.  Note patient son was also positive for SARS-CoV-2. Chest x-ray on May 11 showed improvement in bilateral opacities. Patient's appetite is improving. SARS-CoV-2 negative on 08/02/2018  Patient has underlying sleep apnea.  Is on CPAP at bedtime.  She is doing well.  Download shows excellent compliance with daily average usage at 8.5 hours.  Patient is on CPAP 16 cm H2O.  AHI 0.5.  Minimum leaks.  Allergies  Allergen Reactions  . Codeine Palpitations    Immunization History  Administered Date(s) Administered  . Influenza Split 02/16/2012  . Influenza, High Dose Seasonal PF 11/13/2016, 11/23/2017  . Influenza, Seasonal, Injecte, Preservative Fre 12/27/2012, 12/14/2015  . Influenza,inj,Quad PF,6+ Mos 12/27/2012, 12/14/2015  . Influenza-Unspecified 11/15/2013, 11/29/2014  . Pneumococcal Conjugate-13 11/13/2016  . Pneumococcal Polysaccharide-23 08/03/2012  . Tdap 07/17/2015    Past Medical  History:  Diagnosis Date  . Carpal tunnel syndrome of right wrist   . Coronary artery disease    per cath 11-29-2013  mild diffuse CAD and calcification in proximal LAD  . Depression   . Diverticulosis   . GERD (gastroesophageal reflux disease)   . History of endometrial cancer 05/2003   FIGO, Grade 1  s/p  TAG w/ BSO  . HTN (hypertension)   . Hyperlipidemia   . OA (osteoarthritis)   . OSA on CPAP    followed by dr Elsworth Soho-- per study 03-26-2012  mod. to severe OSA , AHI 37/hr  . Type II diabetes mellitus ([place])    followed by pcp  . Urinary incontinence in female   . Vitamin D deficiency     Tobacco History: Social History   Tobacco Use  Smoking Status Never Smoker  Smokeless Tobacco Never Used   Counseling given: Not Answered   Outpatient Medications Prior to Visit  Medication Sig Dispense Refill  . ACCU-CHEK AVIVA PLUS test strip U UTD  5  . ACCU-CHEK SOFTCLIX LANCETS lancets U UTD  11  . amLODipine (NORVASC) 10 MG tablet Take 1 tablet (10 mg total) by mouth daily. (Patient taking differently: Take 10 mg by mouth every morning. ) 30 tablet 2  . atorvastatin (LIPITOR) 80 MG tablet Take 1 tablet by mouth every evening.   5  . benazepril (LOTENSIN) 40 MG tablet Take 40 mg by mouth every evening.     . bisoprolol (ZEBETA) 10 MG tablet TAKE 1 TABLET BY MOUTH DAILY 90 tablet 1  . cetirizine (  ZYRTEC) 10 MG tablet Take 10 mg by mouth at bedtime.     . chlorthalidone (HYGROTON) 25 MG tablet Take 25 mg by mouth every morning. 1 table once daily  3  . escitalopram (LEXAPRO) 20 MG tablet Take 20 mg by mouth every evening.     Marland Kitchen glimepiride (AMARYL) 4 MG tablet Take 2 mg by mouth daily with breakfast. 1/2 tablet daily    . linagliptin (TRADJENTA) 5 MG TABS tablet Take 1 tablet (5 mg total) by mouth every morning. 30 tablet 0  . meloxicam (MOBIC) 15 MG tablet Take 15 mg by mouth every morning.     . metFORMIN (GLUCOPHAGE) 1000 MG tablet Take 1 tablet (1,000 mg total) by mouth 2 (two)  times daily with a meal. 60 tablet 0  . Multiple Vitamin (MULTIVITAMIN) tablet Take 1 tablet by mouth daily.    Marland Kitchen omeprazole (PRILOSEC) 20 MG capsule Take 20 mg by mouth every morning.     . Vitamin D, Ergocalciferol, (DRISDOL) 1.25 MG (50000 UT) CAPS capsule Take 1 capsule (50,000 Units total) by mouth every 3 (three) days. 10 capsule 0  . ondansetron (ZOFRAN) 4 MG tablet Take 1 tablet (4 mg total) by mouth every 8 (eight) hours as needed for nausea or vomiting. 10 tablet 0   No facility-administered medications prior to visit.      Review of Systems:   Constitutional:   No  weight loss, night sweats,  Fevers, chills, + fatigue, or  lassitude.  HEENT:   No headaches,  Difficulty swallowing,  Tooth/dental problems, or  Sore throat,                No sneezing, itching, ear ache, nasal congestion, post nasal drip,   CV:  No chest pain,  Orthopnea, PND, swelling in lower extremities, anasarca, dizziness, palpitations, syncope.   GI  No heartburn, indigestion, abdominal pain, nausea, vomiting, diarrhea, change in bowel habits, loss of appetite, bloody stools.   Resp:    No chest wall deformity  Skin: no rash or lesions.  GU: no dysuria, change in color of urine, no urgency or frequency.  No flank pain, no hematuria   MS:  No joint pain or swelling.  No decreased range of motion.  No back pain.    Physical Exam  BP 140/70 (BP Location: Left Arm, Cuff Size: Large)   Pulse 60   Ht 5\' 3"  (1.6 m)   Wt 250 lb (113.4 kg)   LMP 11/25/2002 (Approximate)   SpO2 98%   BMI 44.29 kg/m   GEN: A/Ox3; pleasant , NAD, obese , elderly    HEENT:  [place]/AT,  EACs-clear, TMs-wnl, NOSE-clear, THROAT-clear, no lesions, no postnasal drip or exudate noted.   NECK:  Supple w/ fair ROM; no JVD; normal carotid impulses w/o bruits; no thyromegaly or nodules palpated; no lymphadenopathy.    RESP  Clear  P & A; w/o, wheezes/ rales/ or rhonchi. no accessory muscle use, no dullness to percussion  CARD:   RRR, no m/r/g, tr peripheral edema, pulses intact, no cyanosis or clubbing.  GI:   Soft & nt; nml bowel sounds; no organomegaly or masses detected.   Musco: Warm bil, no deformities or joint swelling noted.   Neuro: alert, no focal deficits noted.    Skin: Warm, no lesions or rashes    Lab Results:  CBC    Component Value Date/Time   WBC 15.7 (H) 07/09/2018 0457   RBC 4.64 07/09/2018 0457   HGB 13.3  07/09/2018 0457   HGB 13.1 04/29/2017 0935   HGB 11.2 (L) 07/11/2014 1340   HCT 41.4 07/09/2018 0457   HCT 39.4 04/29/2017 0935   PLT 334 07/09/2018 0457   MCV 89.2 07/09/2018 0457   MCV 88 04/29/2017 0935   MCH 28.7 07/09/2018 0457   MCHC 32.1 07/09/2018 0457   RDW 13.0 07/09/2018 0457   RDW 14.2 04/29/2017 0935   LYMPHSABS 1.1 07/05/2018 0535   LYMPHSABS 2.1 04/29/2017 0935   MONOABS 0.5 07/05/2018 0535   EOSABS 0.0 07/05/2018 0535   EOSABS 0.2 04/29/2017 0935   BASOSABS 0.1 07/05/2018 0535   BASOSABS 0.0 04/29/2017 0935    BMET    Component Value Date/Time   NA 138 07/09/2018 0457   NA 141 02/01/2018 1125   K 3.7 07/09/2018 0457   CL 98 07/09/2018 0457   CO2 29 07/09/2018 0457   GLUCOSE 134 (H) 07/09/2018 0457   BUN 33 (H) 07/09/2018 0457   BUN 21 02/01/2018 1125   CREATININE 0.80 07/09/2018 0457   CREATININE 1.24 (H) 07/16/2015 1339   CALCIUM 8.8 (L) 07/09/2018 0457   GFRNONAA >60 07/09/2018 0457   GFRAA >60 07/09/2018 0457    BNP No results found for: BNP  ProBNP No results found for: PROBNP  Imaging: No results found.    No flowsheet data found.  No results found for: NITRICOXIDE      Assessment & Plan:   Pneumonia due to COVID-19 virus Clinically patient is much improved. Check cxr  SARS-CoV-2 antibodies lab today   Plan  Patient Instructions  Use Oxygen 2l/m with activity . -Goal is O2 sats >88-90%  Activity as tolerated.  Labs today .  Continue on CPAP @At  bedtime  .  Do not drive if sleepy.  Work on healthy weight loss .   Follow up in 2 months with Dr. Elsworth Soho  Or Parrett NP and As needed   Please contact office for sooner follow up if symptoms do not improve or worsen or seek emergency care         Chronic respiratory failure with hypoxia ([place]) Continue on oxygen with activity Saturation goal is greater than 88 to 90%  Obstructive sleep apnea Doing well on CPAP  Plan  Patient Instructions  Use Oxygen 2l/m with activity . -Goal is O2 sats >88-90%  Activity as tolerated.  Labs today .  Continue on CPAP @At  bedtime  .  Do not drive if sleepy.  Work on healthy weight loss .  Follow up in 2 months with Dr. Elsworth Soho  Or Parrett NP and As needed   Please contact office for sooner follow up if symptoms do not improve or worsen or seek emergency care            Rexene Edison, NP 08/20/2018

## 2018-08-22 LAB — SARS-COV-2 ANTIBODY, IGM: SARS-CoV-2 Antibody, IgM: NEGATIVE

## 2018-09-15 ENCOUNTER — Ambulatory Visit: Payer: Medicare Other | Admitting: Certified Nurse Midwife

## 2018-09-21 DIAGNOSIS — Z8619 Personal history of other infectious and parasitic diseases: Secondary | ICD-10-CM | POA: Diagnosis not present

## 2018-09-21 DIAGNOSIS — I10 Essential (primary) hypertension: Secondary | ICD-10-CM | POA: Diagnosis not present

## 2018-09-21 DIAGNOSIS — E782 Mixed hyperlipidemia: Secondary | ICD-10-CM | POA: Diagnosis not present

## 2018-09-21 DIAGNOSIS — E119 Type 2 diabetes mellitus without complications: Secondary | ICD-10-CM | POA: Diagnosis not present

## 2018-09-21 DIAGNOSIS — R399 Unspecified symptoms and signs involving the genitourinary system: Secondary | ICD-10-CM | POA: Diagnosis not present

## 2018-09-27 ENCOUNTER — Other Ambulatory Visit: Payer: Self-pay

## 2018-09-27 ENCOUNTER — Encounter (INDEPENDENT_AMBULATORY_CARE_PROVIDER_SITE_OTHER): Payer: Self-pay | Admitting: Physician Assistant

## 2018-09-27 ENCOUNTER — Telehealth (INDEPENDENT_AMBULATORY_CARE_PROVIDER_SITE_OTHER): Payer: Medicare Other | Admitting: Physician Assistant

## 2018-09-27 DIAGNOSIS — E559 Vitamin D deficiency, unspecified: Secondary | ICD-10-CM

## 2018-09-27 DIAGNOSIS — E119 Type 2 diabetes mellitus without complications: Secondary | ICD-10-CM

## 2018-09-27 DIAGNOSIS — Z6841 Body Mass Index (BMI) 40.0 and over, adult: Secondary | ICD-10-CM | POA: Diagnosis not present

## 2018-09-27 MED ORDER — VITAMIN D (ERGOCALCIFEROL) 1.25 MG (50000 UNIT) PO CAPS: 50000.0000 [IU] | ORAL_CAPSULE | ORAL | 0 refills | Status: DC

## 2018-09-28 NOTE — Progress Notes (Signed)
Office: [phone]  /  Fax: [phone] TeleHealth Visit:  [name] [name] has verbally consented to this TeleHealth visit today. The patient is located at home, the provider is located at the [employer] [facility] office. The participants in this visit include the listed provider and patient. The visit was conducted today via FaceTime.  HPI:   Chief Complaint: OBESITY [name] [name] is here to discuss her progress with her obesity treatment plan. She is on the Category 2 plan and is following her eating plan approximately 80% of the time. She states she is exercising 0 minutes 0 times per week. [name] [name] reports that she is recovering from COVID-19. Her appetite has been decreased but she is beginning to feel better and starting to eat more. We were unable to weigh the patient today for this TeleHealth visit. She feels as if she has gained weight since her last visit. She has lost 5 lbs since starting treatment with [name].  Vitamin D deficiency [name] [name] has a diagnosis of Vitamin D deficiency. She is currently taking prescription Vit D and denies nausea, vomiting or muscle weakness.  Diabetes Mellitus [name] [name] has a diagnosis of diabetes mellitus and is on Tradjenta, metformin, and glimepiride.  [name] [name] states fasting blood sugars range between 99 and 124. Last A1c was 6.7 on 02/01/2018. She has been working on intensive lifestyle modifications including diet, exercise, and weight loss to help control her blood glucose levels. No nausea, vomiting, diarrhea, or polyphagia. No hypoglycemia.  ASSESSMENT AND PLAN:  Vitamin D deficiency - Plan: Vitamin D, Ergocalciferol, (DRISDOL) 1.25 MG (50000 UT) CAPS capsule  Type 2 diabetes mellitus without complication, without long-term current use of insulin (HCC)  Class 3 severe obesity with serious comorbidity and body mass index (BMI) of 40.0 to 44.9 in adult, unspecified obesity type ([place])  PLAN:  Vitamin D Deficiency [name] [name] was informed  that low Vitamin D levels contributes to fatigue and are associated with obesity, breast, and colon cancer. She agrees to continue to take prescription Vit D @ 50,000 IU every 3 days #10 with 0 refills and will follow-up for routine testing of Vitamin D, at least 2-3 times per year. She was informed of the risk of over-replacement of Vitamin D and agrees to not increase her dose unless she discusses this with [name] first. [name] [name] agrees to follow-up with our clinic in 2 weeks.  Diabetes Mellitus [name] has been given extensive diabetes education by myself today including ideal fasting and post-prandial blood glucose readings, individual ideal Hgb A1c goals  and hypoglycemia prevention. We discussed the importance of good blood sugar control to decrease the likelihood of diabetic complications such as nephropathy, neuropathy, limb loss, blindness, coronary artery disease, and death. We discussed the importance of intensive lifestyle modification including diet, exercise and weight loss as the first line treatment for diabetes. [name] [name] agrees to continue her diabetes medications and will follow-up at the agreed upon time.  Obesity [name] [name] is currently in the action stage of change. As such, her goal is to continue with weight loss efforts. She has agreed to follow the Category 2 plan. [name] [name] has been instructed to work up to a goal of 150 minutes of combined cardio and strengthening exercise per week for weight loss and overall health benefits. We discussed the following Behavioral Modification Strategies today: no skipping meals, work on meal planning and easy cooking plans.  [name] [name] has agreed to follow-up with our clinic in 2 weeks. She was informed of the importance of frequent follow-up visits  to maximize her success with intensive lifestyle modifications for her multiple health conditions.  ALLERGIES: Allergies  Allergen Reactions   Codeine Palpitations    MEDICATIONS: Current  Outpatient Medications on File Prior to Visit  Medication Sig Dispense Refill   ACCU-CHEK AVIVA PLUS test strip U UTD  5   ACCU-CHEK SOFTCLIX LANCETS lancets U UTD  11   amLODipine (NORVASC) 10 MG tablet Take 1 tablet (10 mg total) by mouth daily. (Patient taking differently: Take 10 mg by mouth every morning. ) 30 tablet 2   atorvastatin (LIPITOR) 80 MG tablet Take 1 tablet by mouth every evening.   5   benazepril (LOTENSIN) 40 MG tablet Take 40 mg by mouth every evening.      bisoprolol (ZEBETA) 10 MG tablet TAKE 1 TABLET BY MOUTH DAILY 90 tablet 1   cetirizine (ZYRTEC) 10 MG tablet Take 10 mg by mouth at bedtime.      chlorthalidone (HYGROTON) 25 MG tablet Take 25 mg by mouth every morning. 1 table once daily  3   escitalopram (LEXAPRO) 20 MG tablet Take 20 mg by mouth every evening.      glimepiride (AMARYL) 4 MG tablet Take 2 mg by mouth daily with breakfast. 1/2 tablet daily     linagliptin (TRADJENTA) 5 MG TABS tablet Take 1 tablet (5 mg total) by mouth every morning. 30 tablet 0   meloxicam (MOBIC) 15 MG tablet Take 15 mg by mouth every morning.      metFORMIN (GLUCOPHAGE) 1000 MG tablet Take 1 tablet (1,000 mg total) by mouth 2 (two) times daily with a meal. 60 tablet 0   Multiple Vitamin (MULTIVITAMIN) tablet Take 1 tablet by mouth daily.     omeprazole (PRILOSEC) 20 MG capsule Take 20 mg by mouth every morning.      No current facility-administered medications on file prior to visit.     PAST MEDICAL HISTORY: Past Medical History:  Diagnosis Date   Carpal tunnel syndrome of right wrist    Coronary artery disease    per cath 11-29-2013  mild diffuse CAD and calcification in proximal LAD   Depression    Diverticulosis    GERD (gastroesophageal reflux disease)    History of endometrial cancer 05/2003   FIGO, Grade 1  s/p  TAG w/ BSO   HTN (hypertension)    Hyperlipidemia    OA (osteoarthritis)    OSA on CPAP    followed by dr Elsworth Soho-- per study  03-26-2012  mod. to severe OSA , AHI 37/hr   Type II diabetes mellitus ([place])    followed by pcp   Urinary incontinence in female    Vitamin D deficiency     PAST SURGICAL HISTORY: Past Surgical History:  Procedure Laterality Date   CARDIAC CATHETERIZATION  11/29/2013   dr Einar Gip   mild diffuse noncritial CAD and mild calcification involving the proximal LAD,  LVEF 55-60%   CARPAL TUNNEL RELEASE Right 07/17/2017   Procedure: CARPAL TUNNEL RELEASE;  Surgeon: Dorna Leitz, MD;  Location: [place];  Service: Orthopedics;  Laterality: Right;   KNEE ARTHROSCOPY Left 10/ 2004;  05-17-2008   dr graves   KNEE ARTHROSCOPY Right 11/2012   LAPAROSCOPIC CHOLECYSTECTOMY  11-19-2006  dr Zella Richer  [facility]   LEFT HEART CATHETERIZATION WITH CORONARY ANGIOGRAM N/A 11/29/2013   Procedure: LEFT HEART CATHETERIZATION WITH CORONARY ANGIOGRAM;  Surgeon: Laverda Page, MD;  Location: [facility] CATH LAB;  Service: Cardiovascular;  Laterality: N/A;   TOTAL ABDOMINAL HYSTERECTOMY  W/ BILATERAL SALPINGOOPHORECTOMY  06-20-2003    dr Mohammed Kindle  [facility]   endometrial cancer   TOTAL HIP ARTHROPLASTY Right 09/02/2013   Procedure: RIGHT TOTAL HIP ARTHROPLASTY ANTERIOR APPROACH;  Surgeon: Alta Corning, MD;  Location: [place];  Service: Orthopedics;  Laterality: Right;   TOTAL KNEE ARTHROPLASTY Left 08/02/2012   Procedure: TOTAL KNEE ARTHROPLASTY;  Surgeon: Alta Corning, MD;  Location: [place];  Service: Orthopedics;  Laterality: Left;  left total knee arthroplasty   TUBAL LIGATION  yrs ago    SOCIAL HISTORY: Social History   Tobacco Use   Smoking status: Never Smoker   Smokeless tobacco: Never Used  Substance Use Topics   Alcohol use: No   Drug use: No    FAMILY HISTORY: Family History  Problem Relation Age of Onset   Heart disease Father 30       CABG   Lung cancer Father 33       lung   Hypertension Father    Diabetes Father    High Cholesterol Father    Diabetes type II Father     Heart disease Sister 11       CABG   Diabetes Sister 103   Heart disease Brother 38       MI with stents   Hypertension Mother    Diabetes Mother    High Cholesterol Mother    Sudden death Mother    Thyroid disease Mother    Obesity Mother    Hypertension Sister        x2    Diabetes Sister    Hypertension Brother    Diabetes Brother    Cancer Sister        high grade Neuroendocrine carcinoma right axilla   Pancreatic cancer Other        x 3 - 2 MAunts and 1 P Aunts.    Lupus Other        Pat Aunt   ROS: Review of Systems  Gastrointestinal: Negative for diarrhea, nausea and vomiting.  Musculoskeletal:       Negative for muscle weakness.  Endo/Heme/Allergies:       Negative for hypoglycemia. Negative for polyphagia.   PHYSICAL EXAM: Pt in no acute distress  RECENT LABS AND TESTS: BMET    Component Value Date/Time   NA 138 07/09/2018 0457   NA 141 02/01/2018 1125   K 3.7 07/09/2018 0457   CL 98 07/09/2018 0457   CO2 29 07/09/2018 0457   GLUCOSE 134 (H) 07/09/2018 0457   BUN 33 (H) 07/09/2018 0457   BUN 21 02/01/2018 1125   CREATININE 0.80 07/09/2018 0457   CREATININE 1.24 (H) 07/16/2015 1339   CALCIUM 8.8 (L) 07/09/2018 0457   GFRNONAA >60 07/09/2018 0457   GFRAA >60 07/09/2018 0457   Lab Results  Component Value Date   HGBA1C 6.7 (H) 02/01/2018   HGBA1C 6.7 (H) 10/06/2017   HGBA1C 6.9 (H) 04/29/2017   HGBA1C 7.0 (H) 07/16/2015   Lab Results  Component Value Date   INSULIN 21.4 02/01/2018   INSULIN 24.9 10/06/2017   INSULIN 36.0 (H) 04/29/2017   CBC    Component Value Date/Time   WBC 15.7 (H) 07/09/2018 0457   RBC 4.64 07/09/2018 0457   HGB 13.3 07/09/2018 0457   HGB 13.1 04/29/2017 0935   HGB 11.2 (L) 07/11/2014 1340   HCT 41.4 07/09/2018 0457   HCT 39.4 04/29/2017 0935   PLT 334 07/09/2018 0457   MCV 89.2 07/09/2018 0457  MCV 88 04/29/2017 0935   MCH 28.7 07/09/2018 0457   MCHC 32.1 07/09/2018 0457   RDW 13.0  07/09/2018 0457   RDW 14.2 04/29/2017 0935   LYMPHSABS 1.1 07/05/2018 0535   LYMPHSABS 2.1 04/29/2017 0935   MONOABS 0.5 07/05/2018 0535   EOSABS 0.0 07/05/2018 0535   EOSABS 0.2 04/29/2017 0935   BASOSABS 0.1 07/05/2018 0535   BASOSABS 0.0 04/29/2017 0935   Iron/TIBC/Ferritin/ %Sat    Component Value Date/Time   FERRITIN 240 07/09/2018 0458   Lipid Panel     Component Value Date/Time   CHOL 228 (H) 02/01/2018 1125   TRIG 199 (H) 06/30/2018 1746   HDL 38 (L) 02/01/2018 1125   CHOLHDL 5.9 (H) 07/16/2015 1339   VLDL NOT CALC 07/16/2015 1339   LDLCALC 115 (H) 02/01/2018 1125   Hepatic Function Panel     Component Value Date/Time   PROT 7.5 07/05/2018 0535   PROT 7.2 02/01/2018 1125   ALBUMIN 3.5 07/05/2018 0535   ALBUMIN 4.4 02/01/2018 1125   AST 64 (H) 07/05/2018 0535   ALT 60 (H) 07/05/2018 0535   ALKPHOS 71 07/05/2018 0535   BILITOT 0.8 07/05/2018 0535   BILITOT 0.4 02/01/2018 1125      Component Value Date/Time   TSH 3.010 04/29/2017 0935   TSH 3.96 07/16/2015 1339   Results for KARLEN, BARBAR (MRN [id number]) as of 09/28/2018 08:03  Ref. Range 02/01/2018 11:25  Vitamin D, 25-Hydroxy Latest Ref Range: 30.0 - 100.0 ng/mL 37.5   I, Michaelene Song, am acting as Location manager for [employer], PA-C I, Abby Potash, PA-C have reviewed above note and agree with its content

## 2018-10-01 DIAGNOSIS — Z1231 Encounter for screening mammogram for malignant neoplasm of breast: Secondary | ICD-10-CM | POA: Diagnosis not present

## 2018-10-11 ENCOUNTER — Ambulatory Visit (INDEPENDENT_AMBULATORY_CARE_PROVIDER_SITE_OTHER): Payer: Medicare Other | Admitting: Family Medicine

## 2018-10-11 ENCOUNTER — Other Ambulatory Visit: Payer: Self-pay

## 2018-10-11 VITALS — BP 138/74 | HR 60 | Temp 98.1°F | Ht 63.0 in | Wt 254.0 lb

## 2018-10-11 DIAGNOSIS — E119 Type 2 diabetes mellitus without complications: Secondary | ICD-10-CM | POA: Diagnosis not present

## 2018-10-11 DIAGNOSIS — Z6841 Body Mass Index (BMI) 40.0 and over, adult: Secondary | ICD-10-CM | POA: Diagnosis not present

## 2018-10-12 NOTE — Progress Notes (Signed)
Office: [phone]  /  Fax: [phone]   HPI:   Chief Complaint: OBESITY [name] [name] is here to discuss her progress with her obesity treatment plan. She is on the Category 2 plan and is following her eating plan approximately 85% of the time. She states she is exercising 0 minutes 0 times per week. [name] [name] is recovering well from [name] [name]. She was hospitalized for 2 weeks in May. She has been doing better on plan over the last few weeks and not skipping meals. She does struggle to eat protein at dinner. Her weight is 248 lb today and has had a weight gain of 5 lbs since her last visit. She has lost 0 lbs since starting treatment with [name] [name].  Diabetes Mellitus [name] [name] has a diagnosis of diabetes type II, which is well controlled. She is taking Tradjenta, metformin, and glimepiride. [name] [name] states fasting blood sugars range between 110 and 128. Last A1c was 6.7 on 02/01/2018. She has been working on intensive lifestyle modifications including diet, exercise, and weight loss to help control her blood glucose levels. No hypoglycemia.  ASSESSMENT AND PLAN:  Type 2 diabetes mellitus without complication, without long-term current use of insulin (HCC)  Class 3 severe obesity with serious comorbidity and body mass index (BMI) of 40.0 to 44.9 in adult, unspecified obesity type ([place])  PLAN:  Diabetes Mellitus [name] [name] has been given extensive diabetes education by myself today including ideal fasting and post-prandial blood glucose readings, individual ideal HgA1c goals  and hypoglycemia prevention. We discussed the importance of good blood sugar control to decrease the likelihood of diabetic complications such as nephropathy, neuropathy, limb loss, blindness, coronary artery disease, and death. We discussed the importance of intensive lifestyle modification including diet, exercise and weight loss as the first line treatment for diabetes. [name] [name] agrees to continue her diabetes medications and will  follow-up at the agreed upon time.  Obesity [name] [name] is currently in the action stage of change. As such, her goal is to continue with weight loss efforts. She has agreed to follow the Category 2 plan. Handout given on [employer]. She may have 2 cups of melon for lunch. We gave out new handouts because she had misplaced her other handouts.I advised her that she could substitute these foods for 2 ounces of meat: 1 Greek yogurt 2 eggs 2 oz of beef or [place] jerky (plain) 2 light string cheeses or 2 slices of light cheese  cup cottage cheese 1 cup Fair Life skim milk  [name] [name] has not been prescribed exercise at this time.  We discussed the following Behavioral Modification Strategies today: increasing lean protein intake and planning for success.  [name] [name] has agreed to follow-up with our clinic in 2-3 weeks. She was informed of the importance of frequent follow-up visits to maximize her success with intensive lifestyle modifications for her multiple health conditions.  ALLERGIES: Allergies  Allergen Reactions  . Codeine Palpitations    MEDICATIONS: Current Outpatient Medications on File Prior to Visit  Medication Sig Dispense Refill  . ACCU-CHEK AVIVA PLUS test strip U UTD  5  . ACCU-CHEK SOFTCLIX LANCETS lancets U UTD  11  . amLODipine (NORVASC) 10 MG tablet Take 1 tablet (10 mg total) by mouth daily. (Patient taking differently: Take 10 mg by mouth every morning. ) 30 tablet 2  . atorvastatin (LIPITOR) 80 MG tablet Take 1 tablet by mouth every evening.   5  . benazepril (LOTENSIN) 40 MG tablet Take 40 mg by mouth every evening.     Marland Kitchen  bisoprolol (ZEBETA) 10 MG tablet TAKE 1 TABLET BY MOUTH DAILY 90 tablet 1  . cetirizine (ZYRTEC) 10 MG tablet Take 10 mg by mouth at bedtime.     . chlorthalidone (HYGROTON) 25 MG tablet Take 25 mg by mouth every morning. 2 table once daily  3  . escitalopram (LEXAPRO) 20 MG tablet Take 20 mg by mouth every evening.     Marland Kitchen glimepiride (AMARYL) 4  MG tablet Take 2 mg by mouth daily with breakfast. 1/2 tablet daily    . linagliptin (TRADJENTA) 5 MG TABS tablet Take 1 tablet (5 mg total) by mouth every morning. 30 tablet 0  . meloxicam (MOBIC) 15 MG tablet Take 15 mg by mouth every morning.     . metFORMIN (GLUCOPHAGE) 1000 MG tablet Take 1 tablet (1,000 mg total) by mouth 2 (two) times daily with a meal. 60 tablet 0  . Multiple Vitamin (MULTIVITAMIN) tablet Take 1 tablet by mouth daily.    Marland Kitchen omeprazole (PRILOSEC) 20 MG capsule Take 20 mg by mouth every morning.     . Vitamin D, Ergocalciferol, (DRISDOL) 1.25 MG (50000 UT) CAPS capsule Take 1 capsule (50,000 Units total) by mouth every 3 (three) days. 10 capsule 0   No current facility-administered medications on file prior to visit.     PAST MEDICAL HISTORY: Past Medical History:  Diagnosis Date  . Carpal tunnel syndrome of right wrist   . Coronary artery disease    per cath 11-29-2013  mild diffuse CAD and calcification in proximal LAD  . Depression   . Diverticulosis   . GERD (gastroesophageal reflux disease)   . History of endometrial cancer 05/2003   FIGO, Grade 1  s/p  TAG w/ BSO  . HTN (hypertension)   . Hyperlipidemia   . OA (osteoarthritis)   . OSA on CPAP    followed by dr Elsworth Soho-- per study 03-26-2012  mod. to severe OSA , AHI 37/hr  . Type II diabetes mellitus ([place])    followed by pcp  . Urinary incontinence in female   . Vitamin D deficiency     PAST SURGICAL HISTORY: Past Surgical History:  Procedure Laterality Date  . CARDIAC CATHETERIZATION  11/29/2013   dr Einar Gip   mild diffuse noncritial CAD and mild calcification involving the proximal LAD,  LVEF 55-60%  . CARPAL TUNNEL RELEASE Right 07/17/2017   Procedure: CARPAL TUNNEL RELEASE;  Surgeon: Dorna Leitz, MD;  Location: [place];  Service: Orthopedics;  Laterality: Right;  . KNEE ARTHROSCOPY Left 10/ 2004;  05-17-2008   dr graves  . KNEE ARTHROSCOPY Right 11/2012  . LAPAROSCOPIC  CHOLECYSTECTOMY  11-19-2006  dr Zella Richer  [facility]  . LEFT HEART CATHETERIZATION WITH CORONARY ANGIOGRAM N/A 11/29/2013   Procedure: LEFT HEART CATHETERIZATION WITH CORONARY ANGIOGRAM;  Surgeon: Laverda Page, MD;  Location: [facility] CATH LAB;  Service: Cardiovascular;  Laterality: N/A;  . TOTAL ABDOMINAL HYSTERECTOMY W/ BILATERAL SALPINGOOPHORECTOMY  06-20-2003    dr Mohammed Kindle  [facility]   endometrial cancer  . TOTAL HIP ARTHROPLASTY Right 09/02/2013   Procedure: RIGHT TOTAL HIP ARTHROPLASTY ANTERIOR APPROACH;  Surgeon: Alta Corning, MD;  Location: [place];  Service: Orthopedics;  Laterality: Right;  . TOTAL KNEE ARTHROPLASTY Left 08/02/2012   Procedure: TOTAL KNEE ARTHROPLASTY;  Surgeon: Alta Corning, MD;  Location: [place];  Service: Orthopedics;  Laterality: Left;  left total knee arthroplasty  . TUBAL LIGATION  yrs ago    SOCIAL HISTORY: Social History   Tobacco  Use  . Smoking status: Never Smoker  . Smokeless tobacco: Never Used  Substance Use Topics  . Alcohol use: No  . Drug use: No    FAMILY HISTORY: Family History  Problem Relation Age of Onset  . Heart disease Father 11       CABG  . Lung cancer Father 65       lung  . Hypertension Father   . Diabetes Father   . High Cholesterol Father   . Diabetes type II Father   . Heart disease Sister 66       CABG  . Diabetes Sister 66  . Heart disease Brother 43       MI with stents  . Hypertension Mother   . Diabetes Mother   . High Cholesterol Mother   . Sudden death Mother   . Thyroid disease Mother   . Obesity Mother   . Hypertension Sister        x2   . Diabetes Sister   . Hypertension Brother   . Diabetes Brother   . Cancer Sister        high grade Neuroendocrine carcinoma right axilla  . Pancreatic cancer Other        x 3 - 2 MAunts and 1 P Aunts.   . Lupus Other        Pat Aunt   ROS: Review of Systems  Endo/Heme/Allergies:       Negative for hypoglycemia.   PHYSICAL EXAM: Blood pressure 138/74, pulse 60,  temperature 98.1 F (36.7 C), height 5\' 3"  (1.6 m), weight 284 lb (128.8 kg), last menstrual period 11/25/2002, SpO2 96 %. Body mass index is 50.31 kg/m. Physical Exam Vitals signs reviewed.  Constitutional:      Appearance: Normal appearance. She is obese.  Cardiovascular:     Rate and Rhythm: Normal rate.     Pulses: Normal pulses.  Pulmonary:     Effort: Pulmonary effort is normal.     Breath sounds: Normal breath sounds.  Musculoskeletal: Normal range of motion.  Skin:    General: Skin is warm and dry.  Neurological:     Mental Status: She is alert and oriented to person, place, and time.  Psychiatric:        Behavior: Behavior normal.   RECENT LABS AND TESTS: BMET    Component Value Date/Time   NA 138 07/09/2018 0457   NA 141 02/01/2018 1125   K 3.7 07/09/2018 0457   CL 98 07/09/2018 0457   CO2 29 07/09/2018 0457   GLUCOSE 134 (H) 07/09/2018 0457   BUN 33 (H) 07/09/2018 0457   BUN 21 02/01/2018 1125   CREATININE 0.80 07/09/2018 0457   CREATININE 1.24 (H) 07/16/2015 1339   CALCIUM 8.8 (L) 07/09/2018 0457   GFRNONAA >60 07/09/2018 0457   GFRAA >60 07/09/2018 0457   Lab Results  Component Value Date   HGBA1C 6.7 (H) 02/01/2018   HGBA1C 6.7 (H) 10/06/2017   HGBA1C 6.9 (H) 04/29/2017   HGBA1C 7.0 (H) 07/16/2015   Lab Results  Component Value Date   INSULIN 21.4 02/01/2018   INSULIN 24.9 10/06/2017   INSULIN 36.0 (H) 04/29/2017   CBC    Component Value Date/Time   WBC 15.7 (H) 07/09/2018 0457   RBC 4.64 07/09/2018 0457   HGB 13.3 07/09/2018 0457   HGB 13.1 04/29/2017 0935   HGB 11.2 (L) 07/11/2014 1340   HCT 41.4 07/09/2018 0457   HCT 39.4 04/29/2017 0935   PLT  334 07/09/2018 0457   MCV 89.2 07/09/2018 0457   MCV 88 04/29/2017 0935   MCH 28.7 07/09/2018 0457   MCHC 32.1 07/09/2018 0457   RDW 13.0 07/09/2018 0457   RDW 14.2 04/29/2017 0935   LYMPHSABS 1.1 07/05/2018 0535   LYMPHSABS 2.1 04/29/2017 0935   MONOABS 0.5 07/05/2018 0535   EOSABS 0.0  07/05/2018 0535   EOSABS 0.2 04/29/2017 0935   BASOSABS 0.1 07/05/2018 0535   BASOSABS 0.0 04/29/2017 0935   Iron/TIBC/Ferritin/ %Sat    Component Value Date/Time   FERRITIN 240 07/09/2018 0458   Lipid Panel     Component Value Date/Time   CHOL 228 (H) 02/01/2018 1125   TRIG 199 (H) 06/30/2018 1746   HDL 38 (L) 02/01/2018 1125   CHOLHDL 5.9 (H) 07/16/2015 1339   VLDL NOT CALC 07/16/2015 1339   LDLCALC 115 (H) 02/01/2018 1125   Hepatic Function Panel     Component Value Date/Time   PROT 7.5 07/05/2018 0535   PROT 7.2 02/01/2018 1125   ALBUMIN 3.5 07/05/2018 0535   ALBUMIN 4.4 02/01/2018 1125   AST 64 (H) 07/05/2018 0535   ALT 60 (H) 07/05/2018 0535   ALKPHOS 71 07/05/2018 0535   BILITOT 0.8 07/05/2018 0535   BILITOT 0.4 02/01/2018 1125      Component Value Date/Time   TSH 3.010 04/29/2017 0935   TSH 3.96 07/16/2015 1339   Results for OLAYINKA, [name] [name] (MRN [id number]) as of 10/12/2018 11:56  Ref. Range 02/01/2018 11:25  Vitamin D, 25-Hydroxy Latest Ref Range: 30.0 - 100.0 ng/mL 37.5   OBESITY BEHAVIORAL INTERVENTION VISIT  Today's visit was #22  Starting weight: 248 lbs Starting date: 04/29/2017 Today's weight: 248 lbs  Today's date: 10/11/2018 Total lbs lost to date: 0 At least 15 minutes were spent on discussing the following behavioral intervention visit.    10/11/2018  Height 5\' 3"  (1.6 m)  Weight 284 lb (128.8 kg)  BMI (Calculated) 50.32  BLOOD PRESSURE - SYSTOLIC 357  BLOOD PRESSURE - DIASTOLIC 74   Body Fat % 01.7 %  Total Body Water (lbs) 89.21 lbs   ASK: We discussed the diagnosis of obesity with [name] today and Jeanenne agreed to give [name] [name] permission to discuss obesity behavioral modification therapy today.  ASSESS: Dola has the diagnosis of obesity and her BMI today is 44.0. Gini is in the action stage of change.   ADVISE: Nalia was educated on the multiple health risks of obesity as well as the benefit of weight loss to  improve her health. She was advised of the need for long term treatment and the importance of lifestyle modifications to improve her current health and to decrease her risk of future health problems.  AGREE: Multiple dietary modification options and treatment options were discussed and  Natanya agreed to follow the recommendations documented in the above note.  ARRANGE: Deyani was educated on the importance of frequent visits to treat obesity as outlined per CMS and USPSTF guidelines and agreed to schedule her next follow up appointment today.  IMichaelene Song, am acting as Location manager for [employer], FNP  I have reviewed the above documentation for accuracy and completeness, and I agree with the above.  - Bernardina Cacho, FNP-C.

## 2018-10-13 ENCOUNTER — Encounter (INDEPENDENT_AMBULATORY_CARE_PROVIDER_SITE_OTHER): Payer: Self-pay | Admitting: Family Medicine

## 2018-10-13 DIAGNOSIS — Z6841 Body Mass Index (BMI) 40.0 and over, adult: Secondary | ICD-10-CM | POA: Insufficient documentation

## 2018-10-14 ENCOUNTER — Other Ambulatory Visit: Payer: Self-pay | Admitting: Cardiology

## 2018-10-14 ENCOUNTER — Telehealth: Payer: Self-pay

## 2018-10-14 NOTE — Telephone Encounter (Signed)
Thats fine, she can make an appt next week or when available.

## 2018-10-14 NOTE — Telephone Encounter (Signed)
Please handle this thanks 

## 2018-10-14 NOTE — Telephone Encounter (Signed)
Pt called she says that she had covid in may for 14 days and since she has been home she says she has some pain in her chest and sometimes it skips a beat and she wants to make an appointment; She hasn't been seen since 2019

## 2018-10-21 ENCOUNTER — Ambulatory Visit: Payer: Medicare Other | Admitting: Adult Health

## 2018-10-22 ENCOUNTER — Ambulatory Visit (INDEPENDENT_AMBULATORY_CARE_PROVIDER_SITE_OTHER): Payer: Medicare Other

## 2018-10-22 ENCOUNTER — Encounter: Payer: Self-pay | Admitting: Adult Health

## 2018-10-22 ENCOUNTER — Other Ambulatory Visit: Payer: Self-pay

## 2018-10-22 ENCOUNTER — Ambulatory Visit (INDEPENDENT_AMBULATORY_CARE_PROVIDER_SITE_OTHER): Payer: Medicare Other | Admitting: Adult Health

## 2018-10-22 VITALS — BP 114/66 | HR 61 | Temp 97.3°F | Ht 63.5 in | Wt 256.0 lb

## 2018-10-22 DIAGNOSIS — Z23 Encounter for immunization: Secondary | ICD-10-CM | POA: Diagnosis not present

## 2018-10-22 DIAGNOSIS — J1282 Pneumonia due to coronavirus disease 2019: Secondary | ICD-10-CM

## 2018-10-22 DIAGNOSIS — G4733 Obstructive sleep apnea (adult) (pediatric): Secondary | ICD-10-CM

## 2018-10-22 DIAGNOSIS — J189 Pneumonia, unspecified organism: Secondary | ICD-10-CM | POA: Diagnosis not present

## 2018-10-22 DIAGNOSIS — J1289 Other viral pneumonia: Secondary | ICD-10-CM

## 2018-10-22 DIAGNOSIS — U071 COVID-19: Secondary | ICD-10-CM

## 2018-10-22 DIAGNOSIS — J9611 Chronic respiratory failure with hypoxia: Secondary | ICD-10-CM | POA: Diagnosis not present

## 2018-10-22 NOTE — Addendum Note (Signed)
Addended by: Parke Poisson E on: 10/22/2018 02:47 PM   Modules accepted: Orders

## 2018-10-22 NOTE — Assessment & Plan Note (Signed)
No desaturations with ambulation.  O2 saturations greater than 90% on room air.  May discontinue home oxygen

## 2018-10-22 NOTE — Progress Notes (Signed)
@Patient  ID: [name] [name], female    DOB: [birth year], 68 y.o.   MRN: [id number]  Chief Complaint  Patient presents with  . Follow-up    PNA     Referring provider: Vicenta Aly, FNP  HPI: 68 year old female followed for obstructive sleep apnea on nocturnal CPAP COVID-19 pneumonia with acute respiratory failure in May 2020  TEST/EVENTS :   10/22/2018 Follow up : COVID 19-PNA , OSA  Patient presents for a 29-month follow-up.  Patient has underlying obstructive sleep apnea.  She says she is doing well on nocturnal CPAP.  She wears it every night.  She feels rested with no significant daytime sleepiness.  CPAP download shows excellent compliance with 100% usage.  Daily average usage at 8 hours.  Patient is on CPAP 60 cm H2O.  AHI 1.1.  Patient was admitted in May 2020 with COVID-19.  She had acute respiratory distress and hypoxemia.  She had a secondary pneumonia with bilateral infiltrates on chest x-ray.  She was treated with aggressive IV antibiotics, steroids, convalescent plasma and 2 doses of at Actemra .  Since last visit patient says she is slowly improving.  Energy level is starting to improve.  Chest x-ray last visit showed improvement with decreased bilateral infiltrates.  Today chest x-ray is clear. Denies cough or increased dyspnea.  Activity level is increasing   Was discharged on oxygen with activity . Says does not feel she needs this.  Walk test in the office showed no desaturations on room air.  O2 saturations 94% walking     Allergies  Allergen Reactions  . Codeine Palpitations    Immunization History  Administered Date(s) Administered  . Influenza Split 02/16/2012  . Influenza, High Dose Seasonal PF 11/13/2016, 11/23/2017  . Influenza, Seasonal, Injecte, Preservative Fre 12/27/2012, 12/14/2015  . Influenza,inj,Quad PF,6+ Mos 12/27/2012, 12/14/2015  . Influenza-Unspecified 11/15/2013, 11/29/2014  . Pneumococcal Conjugate-13 11/13/2016  . Pneumococcal  Polysaccharide-23 08/03/2012  . Tdap 07/17/2015    Past Medical History:  Diagnosis Date  . Carpal tunnel syndrome of right wrist   . Coronary artery disease    per cath 11-29-2013  mild diffuse CAD and calcification in proximal LAD  . Depression   . Diverticulosis   . GERD (gastroesophageal reflux disease)   . History of endometrial cancer 05/2003   FIGO, Grade 1  s/p  TAG w/ BSO  . HTN (hypertension)   . Hyperlipidemia   . OA (osteoarthritis)   . OSA on CPAP    followed by dr Elsworth Soho-- per study 03-26-2012  mod. to severe OSA , AHI 37/hr  . Type II diabetes mellitus ([place])    followed by pcp  . Urinary incontinence in female   . Vitamin D deficiency     Tobacco History: Social History   Tobacco Use  Smoking Status Never Smoker  Smokeless Tobacco Never Used   Counseling given: Not Answered   Outpatient Medications Prior to Visit  Medication Sig Dispense Refill  . ACCU-CHEK AVIVA PLUS test strip U UTD  5  . ACCU-CHEK SOFTCLIX LANCETS lancets U UTD  11  . amLODipine (NORVASC) 10 MG tablet Take 1 tablet (10 mg total) by mouth daily. (Patient taking differently: Take 10 mg by mouth every morning. ) 30 tablet 2  . atorvastatin (LIPITOR) 80 MG tablet Take 1 tablet by mouth every evening.   5  . benazepril (LOTENSIN) 40 MG tablet Take 40 mg by mouth every evening.     . bisoprolol (ZEBETA) 10 MG  tablet TAKE 1 TABLET BY MOUTH EVERY DAY 90 tablet 1  . cetirizine (ZYRTEC) 10 MG tablet Take 10 mg by mouth at bedtime.     . chlorthalidone (HYGROTON) 25 MG tablet Take 25 mg by mouth every morning. 2 table once daily  3  . escitalopram (LEXAPRO) 20 MG tablet Take 20 mg by mouth every evening.     Marland Kitchen glimepiride (AMARYL) 4 MG tablet Take 2 mg by mouth daily with breakfast. 1/2 tablet daily    . linagliptin (TRADJENTA) 5 MG TABS tablet Take 1 tablet (5 mg total) by mouth every morning. 30 tablet 0  . meloxicam (MOBIC) 15 MG tablet Take 15 mg by mouth every morning.     . metFORMIN  (GLUCOPHAGE) 1000 MG tablet Take 1 tablet (1,000 mg total) by mouth 2 (two) times daily with a meal. 60 tablet 0  . Multiple Vitamin (MULTIVITAMIN) tablet Take 1 tablet by mouth daily.    Marland Kitchen omeprazole (PRILOSEC) 20 MG capsule Take 20 mg by mouth every morning.     . Vitamin D, Ergocalciferol, (DRISDOL) 1.25 MG (50000 UT) CAPS capsule Take 1 capsule (50,000 Units total) by mouth every 3 (three) days. 10 capsule 0   No facility-administered medications prior to visit.      Review of Systems:   Constitutional:   No  weight loss, night sweats,  Fevers, chills,  +fatigue, or  lassitude.  HEENT:   No headaches,  Difficulty swallowing,  Tooth/dental problems, or  Sore throat,                No sneezing, itching, ear ache, nasal congestion, post nasal drip,   CV:  No chest pain,  Orthopnea, PND, +swelling in lower extremities,  No anasarca, dizziness, palpitations, syncope.   GI  No heartburn, indigestion, abdominal pain, nausea, vomiting, diarrhea, change in bowel habits, loss of appetite, bloody stools.   Resp:    No chest wall deformity  Skin: no rash or lesions.  GU: no dysuria, change in color of urine, no urgency or frequency.  No flank pain, no hematuria   MS:  No joint pain or swelling.  No decreased range of motion.  No back pain.    Physical Exam  BP 114/66 (BP Location: Left Arm, Cuff Size: Large)   Pulse 61   Temp (!) 97.3 F (36.3 C) (Oral)   Ht 5' 3.5" (1.613 m)   Wt 256 lb (116.1 kg)   LMP 11/25/2002 (Approximate)   SpO2 94%   BMI 44.64 kg/m   GEN: A/Ox3; pleasant , NAD, obese    HEENT:  [place]/AT,   , NOSE-clear, THROAT-clear, no lesions, no postnasal drip or exudate noted.   NECK:  Supple w/ fair ROM; no JVD; normal carotid impulses w/o bruits; no thyromegaly or nodules palpated; no lymphadenopathy.    RESP  Clear  P & A; w/o, wheezes/ rales/ or rhonchi. no accessory muscle use, no dullness to percussion  CARD:  RRR, no m/r/g, tr peripheral edema, pulses  intact, no cyanosis or clubbing.  GI:   Soft & nt; nml bowel sounds; no organomegaly or masses detected.   Musco: Warm bil, no deformities or joint swelling noted.   Neuro: alert, no focal deficits noted.    Skin: Warm, no lesions or rashes    Lab Results:  CBC   BMET  BNP No results found for: BNP  ProBNP No results found for: PROBNP  Imaging: Dg Chest 2 View  Result Date: 10/22/2018 CLINICAL  DATA:  Recent pneumonia. EXAM: CHEST - 2 VIEW COMPARISON:  September 19, 2018. FINDINGS: Lungs are now clear. Heart size is upper normal with pulmonary vascularity normal. No adenopathy. There is degenerative change in the thoracic spine. IMPRESSION: Lungs now clear. Heart upper normal in size. No adenopathy evident. Electronically Signed   By: Lowella Grip III M.D.   On: 10/22/2018 13:48      No flowsheet data found.  No results found for: NITRICOXIDE      Assessment & Plan:   Pneumonia due to COVID-19 virus Secondary pneumonia secondary to COVID-19-she has clinically improved.  Chest x-ray has showed total clearance of infiltrates. No further imaging is indicated.   Plan  Patient Instructions  May discontinue Oxygen  Activity as tolerated.  Continue on CPAP @At  bedtime  .  Do not drive if sleepy.  Work on healthy weight loss .  Flu shot today .  Follow up in 4 months with Dr. Elsworth Soho  Or Senie Lanese NP and As needed   Please contact office for sooner follow up if symptoms do not improve or worsen or seek emergency care         Obstructive sleep apnea Excellent control and compliance on nocturnal CPAP continue on current settings  Weight loss is encouraged  Plan  Patient Instructions  May discontinue Oxygen  Activity as tolerated.  Continue on CPAP @At  bedtime  .  Do not drive if sleepy.  Work on healthy weight loss .  Flu shot today .  Follow up in 4 months with Dr. Elsworth Soho  Or Dylon Correa NP and As needed   Please contact office for sooner follow up if symptoms  do not improve or worsen or seek emergency care         Chronic respiratory failure with hypoxia ([place]) No desaturations with ambulation.  O2 saturations greater than 90% on room air.  May discontinue home oxygen     Rexene Edison, NP 10/22/2018

## 2018-10-22 NOTE — Assessment & Plan Note (Signed)
Excellent control and compliance on nocturnal CPAP continue on current settings  Weight loss is encouraged  Plan  Patient Instructions  May discontinue Oxygen  Activity as tolerated.  Continue on CPAP @At  bedtime  .  Do not drive if sleepy.  Work on healthy weight loss .  Flu shot today .  Follow up in 4 months with Dr. Elsworth Soho  Or Payton Prinsen NP and As needed   Please contact office for sooner follow up if symptoms do not improve or worsen or seek emergency care

## 2018-10-22 NOTE — Assessment & Plan Note (Signed)
Secondary pneumonia secondary to COVID-19-she has clinically improved.  Chest x-ray has showed total clearance of infiltrates. No further imaging is indicated.   Plan  Patient Instructions  May discontinue Oxygen  Activity as tolerated.  Continue on CPAP @At  bedtime  .  Do not drive if sleepy.  Work on healthy weight loss .  Flu shot today .  Follow up in 4 months with Dr. Elsworth Soho  Or Janae Bonser NP and As needed   Please contact office for sooner follow up if symptoms do not improve or worsen or seek emergency care

## 2018-10-22 NOTE — Patient Instructions (Addendum)
May discontinue Oxygen  Activity as tolerated.  Continue on CPAP @At  bedtime  .  Do not drive if sleepy.  Work on healthy weight loss .  Flu shot today .  Follow up in 4 months with Dr. Elsworth Soho  Or Tonnie Friedel NP and As needed   Please contact office for sooner follow up if symptoms do not improve or worsen or seek emergency care

## 2018-10-27 ENCOUNTER — Ambulatory Visit (INDEPENDENT_AMBULATORY_CARE_PROVIDER_SITE_OTHER): Payer: Medicare Other | Admitting: Family Medicine

## 2018-10-27 ENCOUNTER — Other Ambulatory Visit: Payer: Self-pay

## 2018-10-27 VITALS — BP 112/66 | HR 53 | Temp 98.2°F | Ht 63.0 in | Wt 250.2 lb

## 2018-10-27 DIAGNOSIS — E559 Vitamin D deficiency, unspecified: Secondary | ICD-10-CM | POA: Diagnosis not present

## 2018-10-27 DIAGNOSIS — Z6841 Body Mass Index (BMI) 40.0 and over, adult: Secondary | ICD-10-CM | POA: Diagnosis not present

## 2018-10-27 DIAGNOSIS — J9611 Chronic respiratory failure with hypoxia: Secondary | ICD-10-CM

## 2018-10-27 DIAGNOSIS — E119 Type 2 diabetes mellitus without complications: Secondary | ICD-10-CM

## 2018-10-27 DIAGNOSIS — R5383 Other fatigue: Secondary | ICD-10-CM

## 2018-10-27 DIAGNOSIS — E7849 Other hyperlipidemia: Secondary | ICD-10-CM

## 2018-10-27 MED ORDER — VITAMIN D (ERGOCALCIFEROL) 1.25 MG (50000 UNIT) PO CAPS: 50000.0000 [IU] | ORAL_CAPSULE | ORAL | 0 refills | Status: DC

## 2018-10-27 NOTE — Progress Notes (Addendum)
Office: [phone]  /  Fax: [phone]   HPI:   Chief Complaint: OBESITY [name] [name] is here to discuss her progress with her obesity treatment plan. She is on the Category 2 plan and is following her eating plan approximately 80 % of the time. She states she is exercising 0 minutes 0 times per week. [name] [name] is getting back on the plan. She has reduced her coffee cream. [name] [name] is getting all of her protein in. She has eliminated coffee creamer and she is drinking her coffee black. Her weight is 250 lb 3.2 oz (113.5 kg) today and she has lost 4 lbs since her last visit. She has gained 2 lbs since starting treatment with [name].  Vitamin D deficiency [name] [name] has a diagnosis of vitamin D deficiency. She is currently taking vit D and her last vitamin D level was 37.5 in December 2019. She denies nausea, vomiting or muscle weakness.  Fatigue [name] [name] is having fatigue, hair loss, heat and cold intolerance and palpitations. She is seeing cardiology next week for an evaluation.  Diabetes II [name] [name] has a diagnosis of diabetes type II. She is well controlled on Tradjenta, Metformin and Glimepiride. [name] [name] states fasting BGs range between 104 and 124 and her CBGs at bedtime are between 158 and 201. They are higher when she eats carbohydrates. [name] [name] denies any hypoglycemic episodes. Last A1c was at 6.7 in December 2019. She has been working on intensive lifestyle modifications including diet, exercise, and weight loss to help control her blood glucose levels.  Hyperlipidemia [name] [name] has hyperlipidemia and she is on statin. Her last LDL was elevated at 115. She has been trying to improve her cholesterol levels with intensive lifestyle modification including a low saturated fat diet, exercise and weight loss. She denies any chest pain or shortness of breath. The 10-year ASCVD risk score [name] [name]) is: 15.5%   Values used to calculate the score:     Age: 68 years     Sex:  Female     Is Non-Hispanic African American: No     Diabetic: Yes     Tobacco smoker: No     Systolic Blood Pressure: [id number] mmHg     Is BP treated: Yes     HDL Cholesterol: 38 mg/dL     Total Cholesterol: 218 mg/dL   ASSESSMENT AND PLAN:  Vitamin D deficiency - Plan: Vitamin D, Ergocalciferol, (DRISDOL) 1.25 MG (50000 UT) CAPS capsule, VITAMIN D 25 Hydroxy (Vit-D Deficiency, Fractures)  Other fatigue - Plan: T3, T4, free, TSH  Type 2 diabetes mellitus without complication, without long-term current use of insulin (HCC) - Plan: Hemoglobin A1c, Comprehensive metabolic panel  Other hyperlipidemia - Plan: Lipid Panel With LDL/HDL Ratio  Class 3 severe obesity with serious comorbidity and body mass index (BMI) of 40.0 to 44.9 in adult, unspecified obesity type (HCC)  PLAN:  Vitamin D Deficiency [name] [name] was informed that low vitamin D levels contributes to fatigue and are associated with obesity, breast, and colon cancer. She agrees to continue to take prescription Vit D @50 ,000 IU every week #4 with no refills. We will check vitamin D level today and she will follow up for routine testing of vitamin D, at least 2-3 times per year. She was informed of the risk of over-replacement of vitamin D and agrees to not increase her dose unless she discusses this with [name] first. [name] [name] agrees to follow up with our clinic in 2 weeks.  Fatigue We will check thyroid panel  today and [name] [name] will follow up with our clinic in 2 weeks.  Diabetes II [name] [name] has been given extensive diabetes education by myself today including ideal fasting and post-prandial blood glucose readings, individual ideal Hgb A1c goals and hypoglycemia prevention. We discussed the importance of good blood sugar control to decrease the likelihood of diabetic complications such as nephropathy, neuropathy, limb loss, blindness, coronary artery disease, and death. We discussed the importance of intensive lifestyle modification  including diet, exercise and weight loss as the first line treatment for diabetes. We will check Hgb A1c and CMET today. [name] [name] agrees to continue her diabetes medications and will follow up at the agreed upon time.  Hyperlipidemia [name] [name] was informed of the American Heart Association Guidelines emphasizing intensive lifestyle modifications as the first line treatment for hyperlipidemia. We discussed many lifestyle modifications today in depth, and [name] [name] will continue to work on decreasing saturated fats such as fatty red meat, butter and many fried foods. She will also increase vegetables and lean protein in her diet and continue to work on exercise and weight loss efforts. We will check fasting lipid panel today and [name] [name] will follow up as directed.  Obesity [name] [name] is currently in the action stage of change. As such, her goal is to continue with weight loss efforts She has agreed to follow the Category 2 plan [name] [name] has not been prescribed exercise at this time.. We discussed the following Behavioral Modification Strategies today: planning for success and increasing lean protein intake  [name] [name] may snack on 1 extra tomato and a few grape tomatoes.  [name] [name] has agreed to follow up with our clinic in 2 weeks. She was informed of the importance of frequent follow up visits to maximize her success with intensive lifestyle modifications for her multiple health conditions.  ALLERGIES: Allergies  Allergen Reactions  . Codeine Palpitations    MEDICATIONS: Current Outpatient Medications on File Prior to Visit  Medication Sig Dispense Refill  . ACCU-CHEK AVIVA PLUS test strip U UTD  5  . ACCU-CHEK SOFTCLIX LANCETS lancets U UTD  11  . amLODipine (NORVASC) 10 MG tablet Take 1 tablet (10 mg total) by mouth daily. (Patient taking differently: Take 10 mg by mouth every morning. ) 30 tablet 2  . atorvastatin (LIPITOR) 80 MG tablet Take 1 tablet by mouth every evening.   5  . benazepril  (LOTENSIN) 40 MG tablet Take 40 mg by mouth every evening.     . bisoprolol (ZEBETA) 10 MG tablet TAKE 1 TABLET BY MOUTH EVERY DAY 90 tablet 1  . cetirizine (ZYRTEC) 10 MG tablet Take 10 mg by mouth at bedtime.     . chlorthalidone (HYGROTON) 25 MG tablet Take 25 mg by mouth every morning. 2 table once daily  3  . escitalopram (LEXAPRO) 20 MG tablet Take 20 mg by mouth every evening.     Marland Kitchen glimepiride (AMARYL) 4 MG tablet Take 2 mg by mouth daily with breakfast. 1/2 tablet daily    . linagliptin (TRADJENTA) 5 MG TABS tablet Take 1 tablet (5 mg total) by mouth every morning. 30 tablet 0  . meloxicam (MOBIC) 15 MG tablet Take 15 mg by mouth every morning.     . metFORMIN (GLUCOPHAGE) 1000 MG tablet Take 1 tablet (1,000 mg total) by mouth 2 (two) times daily with a meal. 60 tablet 0  . Multiple Vitamin (MULTIVITAMIN) tablet Take 1 tablet by mouth daily.    Marland Kitchen omeprazole (PRILOSEC) 20 MG capsule Take 20 mg by mouth every  morning.      No current facility-administered medications on file prior to visit.     PAST MEDICAL HISTORY: Past Medical History:  Diagnosis Date  . Carpal tunnel syndrome of right wrist   . Coronary artery disease    per cath 11-29-2013  mild diffuse CAD and calcification in proximal LAD  . Depression   . Diverticulosis   . GERD (gastroesophageal reflux disease)   . History of endometrial cancer 05/2003   FIGO, Grade 1  s/p  TAG w/ BSO  . HTN (hypertension)   . Hyperlipidemia   . OA (osteoarthritis)   . OSA on CPAP    followed by dr Elsworth Soho-- per study 03-26-2012  mod. to severe OSA , AHI 37/hr  . Type II diabetes mellitus ([place])    followed by pcp  . Urinary incontinence in female   . Vitamin D deficiency     PAST SURGICAL HISTORY: Past Surgical History:  Procedure Laterality Date  . CARDIAC CATHETERIZATION  11/29/2013   dr Einar Gip   mild diffuse noncritial CAD and mild calcification involving the proximal LAD,  LVEF 55-60%  . CARPAL TUNNEL RELEASE Right 07/17/2017    Procedure: CARPAL TUNNEL RELEASE;  Surgeon: Dorna Leitz, MD;  Location: [place];  Service: Orthopedics;  Laterality: Right;  . KNEE ARTHROSCOPY Left 10/ 2004;  05-17-2008   dr graves  . KNEE ARTHROSCOPY Right 11/2012  . LAPAROSCOPIC CHOLECYSTECTOMY  11-19-2006  dr Zella Richer  [facility]  . LEFT HEART CATHETERIZATION WITH CORONARY ANGIOGRAM N/A 11/29/2013   Procedure: LEFT HEART CATHETERIZATION WITH CORONARY ANGIOGRAM;  Surgeon: Laverda Page, MD;  Location: [name] C[facility] LAB;  Service: Cardiovascular;  Laterality: N/A;  . TOTAL ABDOMINAL HYSTERECTOMY W/ BILATERAL SALPINGOOPHORECTOMY  06-20-2003    dr Mohammed Kindle  [facility]   endometrial cancer  . TOTAL HIP ARTHROPLASTY Right 09/02/2013   Procedure: RIGHT TOTAL HIP ARTHROPLASTY ANTERIOR APPROACH;  Surgeon: Alta Corning, MD;  Location: [place];  Service: Orthopedics;  Laterality: Right;  . TOTAL KNEE ARTHROPLASTY Left 08/02/2012   Procedure: TOTAL KNEE ARTHROPLASTY;  Surgeon: Alta Corning, MD;  Location: [place];  Service: Orthopedics;  Laterality: Left;  left total knee arthroplasty  . TUBAL LIGATION  yrs ago    SOCIAL HISTORY: Social History   Tobacco Use  . Smoking status: Never Smoker  . Smokeless tobacco: Never Used  Substance Use Topics  . Alcohol use: No  . Drug use: No    FAMILY HISTORY: Family History  Problem Relation Age of Onset  . Heart disease Father 38       CABG  . Lung cancer Father 24       lung  . Hypertension Father   . Diabetes Father   . High Cholesterol Father   . Diabetes type II Father   . Heart disease Sister 97       CABG  . Diabetes Sister 34  . Heart disease Brother 58       MI with stents  . Hypertension Mother   . Diabetes Mother   . High Cholesterol Mother   . Sudden death Mother   . Thyroid disease Mother   . Obesity Mother   . Hypertension Sister        x2   . Diabetes Sister   . Hypertension Brother   . Diabetes Brother   . Cancer Sister        high grade Neuroendocrine carcinoma  right axilla  . Pancreatic cancer Other  x 3 - 2 MAunts and 1 P Aunts.   . Lupus Other        Pat Aunt    ROS: Review of Systems  Constitutional: Positive for malaise/fatigue.  Respiratory: Negative for shortness of breath.   Cardiovascular: Positive for palpitations. Negative for chest pain.  Gastrointestinal: Negative for nausea and vomiting.  Musculoskeletal:       Negative for muscle weakness  Skin:       Positive for hair loss  Endo/Heme/Allergies:       Positive for heat and cold intolerance Negative for hypoglycemia    PHYSICAL EXAM: Blood pressure 112/66, pulse (!) 53, temperature 98.2 F (36.8 C), temperature source Oral, height 5\' 3"  (1.6 m), weight 250 lb 3.2 oz (113.5 kg), last menstrual period 11/25/2002, SpO2 96 %. Body mass index is 44.32 kg/m. Physical Exam Vitals signs reviewed.  Constitutional:      Appearance: Normal appearance. She is well-developed. She is obese.  Cardiovascular:     Rate and Rhythm: Normal rate.  Pulmonary:     Effort: Pulmonary effort is normal.  Musculoskeletal: Normal range of motion.  Skin:    General: Skin is warm and dry.  Neurological:     Mental Status: She is alert and oriented to person, place, and time.  Psychiatric:        Mood and Affect: Mood normal.        Behavior: Behavior normal.     RECENT LABS AND TESTS: BMET    Component Value Date/Time   NA 138 07/09/2018 0457   NA 141 02/01/2018 1125   K 3.7 07/09/2018 0457   CL 98 07/09/2018 0457   CO2 29 07/09/2018 0457   GLUCOSE 134 (H) 07/09/2018 0457   BUN 33 (H) 07/09/2018 0457   BUN 21 02/01/2018 1125   CREATININE 0.80 07/09/2018 0457   CREATININE 1.24 (H) 07/16/2015 1339   CALCIUM 8.8 (L) 07/09/2018 0457   GFRNONAA >60 07/09/2018 0457   GFRAA >60 07/09/2018 0457   Lab Results  Component Value Date   HGBA1C 6.7 (H) 02/01/2018   HGBA1C 6.7 (H) 10/06/2017   HGBA1C 6.9 (H) 04/29/2017   HGBA1C 7.0 (H) 07/16/2015   Lab Results  Component Value  Date   INSULIN 21.4 02/01/2018   INSULIN 24.9 10/06/2017   INSULIN 36.0 (H) 04/29/2017   CBC    Component Value Date/Time   WBC 15.7 (H) 07/09/2018 0457   RBC 4.64 07/09/2018 0457   HGB 13.3 07/09/2018 0457   HGB 13.1 04/29/2017 0935   HGB 11.2 (L) 07/11/2014 1340   HCT 41.4 07/09/2018 0457   HCT 39.4 04/29/2017 0935   PLT 334 07/09/2018 0457   MCV 89.2 07/09/2018 0457   MCV 88 04/29/2017 0935   MCH 28.7 07/09/2018 0457   MCHC 32.1 07/09/2018 0457   RDW 13.0 07/09/2018 0457   RDW 14.2 04/29/2017 0935   LYMPHSABS 1.1 07/05/2018 0535   LYMPHSABS 2.1 04/29/2017 0935   MONOABS 0.5 07/05/2018 0535   EOSABS 0.0 07/05/2018 0535   EOSABS 0.2 04/29/2017 0935   BASOSABS 0.1 07/05/2018 0535   BASOSABS 0.0 04/29/2017 0935   Iron/TIBC/Ferritin/ %Sat    Component Value Date/Time   FERRITIN 240 07/09/2018 0458   Lipid Panel     Component Value Date/Time   CHOL 228 (H) 02/01/2018 1125   TRIG 199 (H) 06/30/2018 1746   HDL 38 (L) 02/01/2018 1125   CHOLHDL 5.9 (H) 07/16/2015 1339   VLDL NOT CALC 07/16/2015 1339  LDLCALC 115 (H) 02/01/2018 1125   Hepatic Function Panel     Component Value Date/Time   PROT 7.5 07/05/2018 0535   PROT 7.2 02/01/2018 1125   ALBUMIN 3.5 07/05/2018 0535   ALBUMIN 4.4 02/01/2018 1125   AST 64 (H) 07/05/2018 0535   ALT 60 (H) 07/05/2018 0535   ALKPHOS 71 07/05/2018 0535   BILITOT 0.8 07/05/2018 0535   BILITOT 0.4 02/01/2018 1125      Component Value Date/Time   TSH 3.010 04/29/2017 0935   TSH 3.96 07/16/2015 1339    Results for [name] [name], LOSEKE (MRN [id number]) as of 10/27/2018 16:21  Ref. Range 02/01/2018 11:25  Vitamin D, 25-Hydroxy Latest Ref Range: 30.0 - 100.0 ng/mL 37.5    OBESITY BEHAVIORAL INTERVENTION VISIT  Today's visit was # 23   Starting weight: 248 lbs Starting date: 04/29/2017 Today's weight : 250 lbs Today's date: 10/27/2018 Total lbs lost to date: 0  At least 15 minutes were spent on discussing the following behavioral  intervention visit.   10/27/2018  Height 5\' 3"  (1.6 m)  Weight 250 lb 3.2 oz (113.5 kg)  BMI (Calculated) 44.33  BLOOD PRESSURE - SYSTOLIC [id number]  BLOOD PRESSURE - DIASTOLIC 66   Body Fat % 56 %    ASK: We discussed the diagnosis of obesity with [name] [name] today and [name] [name] agreed to give [name] permission to discuss obesity behavioral modification therapy today.  ASSESS: [name] [name] has the diagnosis of obesity and her BMI today is 44.33 [name] [name] is in the action stage of change   ADVISE: Crislyn was educated on the multiple health risks of obesity as well as the benefit of weight loss to improve her health. She was advised of the need for long term treatment and the importance of lifestyle modifications to improve her current health and to decrease her risk of future health problems.  AGREE: Multiple dietary modification options and treatment options were discussed and  Destinee agreed to follow the recommendations documented in the above note.  ARRANGE: Adebola was educated on the importance of frequent visits to treat obesity as outlined per CMS and USPSTF guidelines and agreed to schedule her next follow up appointment today.  I, Doreene Nest, am acting as transcriptionist for [employer], FNP-C  I have reviewed the above documentation for accuracy and completeness, and I agree with the above.  -  , FNP-C.

## 2018-10-28 ENCOUNTER — Encounter (INDEPENDENT_AMBULATORY_CARE_PROVIDER_SITE_OTHER): Payer: Self-pay | Admitting: Family Medicine

## 2018-10-28 LAB — COMPREHENSIVE METABOLIC PANEL
ALT: 27 IU/L (ref 0–32)
AST: 18 IU/L (ref 0–40)
Albumin/Globulin Ratio: 1.8 (ref 1.2–2.2)
Albumin: 4.3 g/dL (ref 3.8–4.8)
Alkaline Phosphatase: 53 IU/L (ref 39–117)
BUN/Creatinine Ratio: 31 — ABNORMAL HIGH (ref 12–28)
BUN: 27 mg/dL (ref 8–27)
Bilirubin Total: 0.3 mg/dL (ref 0.0–1.2)
CO2: 24 mmol/L (ref 20–29)
Calcium: 9.5 mg/dL (ref 8.7–10.3)
Chloride: 99 mmol/L (ref 96–106)
Creatinine, Ser: 0.88 mg/dL (ref 0.57–1.00)
GFR calc Af Amer: 79 mL/min/{1.73_m2} (ref >59)
GFR calc non Af Amer: 68 mL/min/{1.73_m2} (ref >59)
Globulin, Total: 2.4 g/dL (ref 1.5–4.5)
Glucose: 109 mg/dL — ABNORMAL HIGH (ref 65–99)
Potassium: 4.2 mmol/L (ref 3.5–5.2)
Sodium: 140 mmol/L (ref 134–144)
Total Protein: 6.7 g/dL (ref 6.0–8.5)

## 2018-10-28 LAB — LIPID PANEL WITH LDL/HDL RATIO
Cholesterol, Total: 214 mg/dL — ABNORMAL HIGH (ref 100–199)
HDL: 35 mg/dL — ABNORMAL LOW (ref >39)
LDL Chol Calc (NIH): 111 mg/dL — ABNORMAL HIGH (ref 0–99)
LDL/HDL Ratio: 3.2 ratio (ref 0.0–3.2)
Triglycerides: 392 mg/dL — ABNORMAL HIGH (ref 0–149)
VLDL Cholesterol Cal: 68 mg/dL — ABNORMAL HIGH (ref 5–40)

## 2018-10-28 LAB — HEMOGLOBIN A1C
Est. average glucose Bld gHb Est-mCnc: 166 mg/dL
Hgb A1c MFr Bld: 7.4 % — ABNORMAL HIGH (ref 4.8–5.6)

## 2018-10-28 LAB — TSH: TSH: 2.3 u[IU]/mL (ref 0.450–4.500)

## 2018-10-28 LAB — T4, FREE: Free T4: 0.96 ng/dL (ref 0.82–1.77)

## 2018-10-28 LAB — VITAMIN D 25 HYDROXY (VIT D DEFICIENCY, FRACTURES): Vit D, 25-Hydroxy: 45.6 ng/mL (ref 30.0–100.0)

## 2018-10-28 LAB — T3: T3, Total: 107 ng/dL (ref 71–180)

## 2018-10-29 DIAGNOSIS — D1801 Hemangioma of skin and subcutaneous tissue: Secondary | ICD-10-CM | POA: Diagnosis not present

## 2018-10-29 DIAGNOSIS — L739 Follicular disorder, unspecified: Secondary | ICD-10-CM | POA: Diagnosis not present

## 2018-10-29 DIAGNOSIS — D2339 Other benign neoplasm of skin of other parts of face: Secondary | ICD-10-CM | POA: Diagnosis not present

## 2018-10-29 DIAGNOSIS — L812 Freckles: Secondary | ICD-10-CM | POA: Diagnosis not present

## 2018-10-29 DIAGNOSIS — L820 Inflamed seborrheic keratosis: Secondary | ICD-10-CM | POA: Diagnosis not present

## 2018-10-29 DIAGNOSIS — L570 Actinic keratosis: Secondary | ICD-10-CM | POA: Diagnosis not present

## 2018-11-02 ENCOUNTER — Ambulatory Visit (INDEPENDENT_AMBULATORY_CARE_PROVIDER_SITE_OTHER): Payer: Medicare Other | Admitting: Cardiology

## 2018-11-02 ENCOUNTER — Other Ambulatory Visit: Payer: Self-pay | Admitting: Cardiology

## 2018-11-02 ENCOUNTER — Encounter: Payer: Self-pay | Admitting: Cardiology

## 2018-11-02 ENCOUNTER — Other Ambulatory Visit: Payer: Self-pay

## 2018-11-02 VITALS — BP 158/74 | HR 56 | Temp 98.2°F | Wt 254.0 lb

## 2018-11-02 DIAGNOSIS — R600 Localized edema: Secondary | ICD-10-CM

## 2018-11-02 DIAGNOSIS — Z6841 Body Mass Index (BMI) 40.0 and over, adult: Secondary | ICD-10-CM

## 2018-11-02 DIAGNOSIS — I2510 Atherosclerotic heart disease of native coronary artery without angina pectoris: Secondary | ICD-10-CM | POA: Diagnosis not present

## 2018-11-02 DIAGNOSIS — R0609 Other forms of dyspnea: Secondary | ICD-10-CM

## 2018-11-02 DIAGNOSIS — R0789 Other chest pain: Secondary | ICD-10-CM

## 2018-11-02 DIAGNOSIS — R0600 Dyspnea, unspecified: Secondary | ICD-10-CM

## 2018-11-02 DIAGNOSIS — I10 Essential (primary) hypertension: Secondary | ICD-10-CM | POA: Diagnosis not present

## 2018-11-02 DIAGNOSIS — R002 Palpitations: Secondary | ICD-10-CM | POA: Diagnosis not present

## 2018-11-02 MED ORDER — SPIRONOLACTONE 25 MG PO TABS: 25.0000 mg | ORAL_TABLET | Freq: Every day | ORAL | 1 refills | Status: DC

## 2018-11-02 NOTE — Progress Notes (Signed)
Primary Physician:  Vicenta Aly, FNP   Patient ID: [name] [name], female    DOB: [birth year], 68 y.o.   MRN: [id number]  Subjective:    Chief Complaint  Patient presents with  . Chest Pain  . New Patient (Initial Visit)    HPI: [name] [name]  is a 68 y.o. female  with history of mild coronary artery disease by coronary angiography in 2015 revealing mild LAD diffuse calcification and coronary atherosclerosis, diabetes mellitus which is recently uncontrolled, morbid obesity with obstructive sleep apnea recently established and is presently on CPAP since 2018, mixed hyperlipidemia, chronic palpitations with history of PACs and PVCs that have remained stable.  Patient was hospitalized on 05/6-5/19/2024 acute respiratory failure with hypoxia secondary to COVID-19.  She is now recovered.  She was initially discharged on oxygen therapy, but that is now been discontinued.  She is being followed by pulmonary and has recently been evaluated by them.  She was last seen by [name] in April 2019.  She is here for follow-up for chest discomfort.  Since her illness of COVID-19, she reports chest discomfort in the center of her chest. Discomfort is persistent, but worsened at night. Not limiting her activities. She has also had swelling in her feet at the end of the day. Feels that their is a correlation between her chest pain and feet swelling. Has some difficulty taking a deep breath when getting ready for night.  She has gained 10 lbs since her illness, but is now back on diet and is back to losing some of her weight.    Past Medical History:  Diagnosis Date  . Carpal tunnel syndrome of right wrist   . Coronary artery disease    per cath 11-29-2013  mild diffuse CAD and calcification in proximal LAD  . Depression   . Diverticulosis   . GERD (gastroesophageal reflux disease)   . History of endometrial cancer 05/2003   FIGO, Grade 1  s/p  TAG w/ BSO  . HTN (hypertension)   . Hyperlipidemia    . OA (osteoarthritis)   . OSA on CPAP    followed by dr Elsworth Soho-- per study 03-26-2012  mod. to severe OSA , AHI 37/hr  . Type II diabetes mellitus ([place])    followed by pcp  . Urinary incontinence in female   . Vitamin D deficiency     Past Surgical History:  Procedure Laterality Date  . CARDIAC CATHETERIZATION  11/29/2013   dr Einar Gip   mild diffuse noncritial CAD and mild calcification involving the proximal LAD,  LVEF 55-60%  . CARPAL TUNNEL RELEASE Right 07/17/2017   Procedure: CARPAL TUNNEL RELEASE;  Surgeon: Dorna Leitz, MD;  Location: [place];  Service: Orthopedics;  Laterality: Right;  . KNEE ARTHROSCOPY Left 10/ 2004;  05-17-2008   dr graves  . KNEE ARTHROSCOPY Right 11/2012  . LAPAROSCOPIC CHOLECYSTECTOMY  11-19-2006  dr Zella Richer  [facility]  . LEFT HEART CATHETERIZATION WITH CORONARY ANGIOGRAM N/A 11/29/2013   Procedure: LEFT HEART CATHETERIZATION WITH CORONARY ANGIOGRAM;  Surgeon: Laverda Page, MD;  Location: [facility] CATH LAB;  Service: Cardiovascular;  Laterality: N/A;  . TOTAL ABDOMINAL HYSTERECTOMY W/ BILATERAL SALPINGOOPHORECTOMY  06-20-2003    dr Mohammed Kindle  [place]   endometrial cancer  . TOTAL HIP ARTHROPLASTY Right 09/02/2013   Procedure: RIGHT TOTAL HIP ARTHROPLASTY ANTERIOR APPROACH;  Surgeon: Alta Corning, MD;  Location: [place];  Service: Orthopedics;  Laterality: Right;  . TOTAL KNEE ARTHROPLASTY Left 08/02/2012  Procedure: TOTAL KNEE ARTHROPLASTY;  Surgeon: Alta Corning, MD;  Location: [place];  Service: Orthopedics;  Laterality: Left;  left total knee arthroplasty  . TUBAL LIGATION  yrs ago    Social History   Socioeconomic History  . Marital status: Married    Spouse name: Arelene Cantor  . Number of children: 3  . Years of education: Not on file  . Highest education level: Not on file  Occupational History  . Occupation: housewife  Social Needs  . Financial resource strain: Not on file  . Food insecurity    Worry: Not on file    Inability: Not on  file  . Transportation needs    Medical: Not on file    Non-medical: Not on file  Tobacco Use  . Smoking status: Never Smoker  . Smokeless tobacco: Never Used  Substance and Sexual Activity  . Alcohol use: No  . Drug use: No  . Sexual activity: Not on file  Lifestyle  . Physical activity    Days per week: Not on file    Minutes per session: Not on file  . Stress: Not on file  Relationships  . Social Herbalist on phone: Not on file    Gets together: Not on file    Attends religious service: Not on file    Active member of club or organization: Not on file    Attends meetings of clubs or organizations: Not on file    Relationship status: Not on file  . Intimate partner violence    Fear of current or ex partner: Not on file    Emotionally abused: Not on file    Physically abused: Not on file    Forced sexual activity: Not on file  Other Topics Concern  . Not on file  Social History Narrative  . Not on file    Review of Systems  Constitution: Positive for weight gain. Negative for decreased appetite, malaise/fatigue and weight loss.  Eyes: Negative for visual disturbance.  Cardiovascular: Positive for chest pain and leg swelling. Negative for claudication, dyspnea on exertion, orthopnea, palpitations and syncope.  Respiratory: Positive for shortness of breath (improved). Negative for hemoptysis and wheezing.   Endocrine: Negative for cold intolerance and heat intolerance.  Hematologic/Lymphatic: Does not bruise/bleed easily.  Skin: Negative for nail changes.  Musculoskeletal: Negative for muscle weakness and myalgias.  Gastrointestinal: Negative for abdominal pain, change in bowel habit, nausea and vomiting.  Neurological: Negative for difficulty with concentration, dizziness, focal weakness and headaches.  Psychiatric/Behavioral: Negative for altered mental status and suicidal ideas.  All other systems reviewed and are negative.     Objective:  Blood  pressure (!) 158/74, pulse (!) 56, temperature 98.2 F (36.8 C), weight 254 lb (115.2 kg), last menstrual period 11/25/2002, SpO2 95 %. Body mass index is 44.99 kg/m.    Physical Exam  Constitutional: She is oriented to person, place, and time. Vital signs are normal. She appears well-developed and well-nourished.  HENT:  Head: Normocephalic and atraumatic.  Neck: Normal range of motion.  Cardiovascular: Normal rate, regular rhythm and intact distal pulses.  Murmur heard.  Low-pitched early systolic murmur is present with a grade of 1/6 at the upper right sternal border. Pulses:      Femoral pulses are 1+ on the right side and 1+ on the left side.      Popliteal pulses are 1+ on the right side and 1+ on the left side.  Dorsalis pedis pulses are 2+ on the right side and 2+ on the left side.       Posterior tibial pulses are 2+ on the right side and 2+ on the left side.  1+ pitting edema bilateral  Pulses difficult to feel due to bodily habitus  Pulmonary/Chest: Effort normal and breath sounds normal. No accessory muscle usage. No respiratory distress.  Abdominal: Soft. Bowel sounds are normal.  Musculoskeletal: Normal range of motion.  Neurological: She is alert and oriented to person, place, and time.  Skin: Skin is warm and dry.  Vitals reviewed.  Radiology: No results found.  Laboratory examination:    CMP Latest Ref Rng & Units 10/27/2018 07/09/2018 07/08/2018  Glucose 65 - 99 mg/dL 109(H) 134(H) 144(H)  BUN 8 - 27 mg/dL 27 33(H) 39(H)  Creatinine 0.57 - 1.00 mg/dL 0.88 0.80 0.80  Sodium 134 - 144 mmol/L 140 138 140  Potassium 3.5 - 5.2 mmol/L 4.2 3.7 3.4(L)  Chloride 96 - 106 mmol/L 99 98 98  CO2 20 - 29 mmol/L 24 29 33(H)  Calcium 8.7 - 10.3 mg/dL 9.5 8.8(L) 9.1  Total Protein 6.0 - 8.5 g/dL 6.7 - -  Total Bilirubin 0.0 - 1.2 mg/dL 0.3 - -  Alkaline Phos 39 - 117 IU/L 53 - -  AST 0 - 40 IU/L 18 - -  ALT 0 - 32 IU/L 27 - -   CBC Latest Ref Rng & Units 07/09/2018  07/07/2018 07/06/2018  WBC 4.0 - 10.5 K/uL 15.7(H) 17.1(H) 15.8(H)  Hemoglobin 12.0 - 15.0 g/dL 13.3 12.9 12.8  Hematocrit 36.0 - 46.0 % 41.4 38.7 39.9  Platelets 150 - 400 K/uL 334 318 322   Lipid Panel     Component Value Date/Time   CHOL 214 (H) 10/27/2018 0945   TRIG 392 (H) 10/27/2018 0945   HDL 35 (L) 10/27/2018 0945   CHOLHDL 5.9 (H) 07/16/2015 1339   VLDL NOT CALC 07/16/2015 1339   LDLCALC 115 (H) 02/01/2018 1125   HEMOGLOBIN A1C Lab Results  Component Value Date   HGBA1C 7.4 (H) 10/27/2018   MPG 154 07/16/2015   TSH Recent Labs    10/27/18 0945  TSH 2.300    PRN Meds:. There are no discontinued medications. Current Meds  Medication Sig  . ACCU-CHEK AVIVA PLUS test strip U UTD  . ACCU-CHEK SOFTCLIX LANCETS lancets U UTD  . amLODipine (NORVASC) 10 MG tablet Take 1 tablet (10 mg total) by mouth daily. (Patient taking differently: Take 10 mg by mouth every morning. )  . atorvastatin (LIPITOR) 80 MG tablet Take 1 tablet by mouth every evening.   . benazepril (LOTENSIN) 40 MG tablet Take 40 mg by mouth every evening.   . bisoprolol (ZEBETA) 10 MG tablet TAKE 1 TABLET BY MOUTH EVERY DAY  . cetirizine (ZYRTEC) 10 MG tablet Take 10 mg by mouth at bedtime.   . chlorthalidone (HYGROTON) 25 MG tablet Take 25 mg by mouth every morning. 2 table once daily  . escitalopram (LEXAPRO) 20 MG tablet Take 20 mg by mouth every evening.   . fluorouracil (EFUDEX) 5 % cream APPLY CREAM BID FOR 4 WEEKS TO THE ARMS. APPLY BID FOR 2 WEEKS TO THE FACE  . glimepiride (AMARYL) 4 MG tablet Take 2 mg by mouth daily with breakfast. 1/2 tablet daily  . linagliptin (TRADJENTA) 5 MG TABS tablet Take 1 tablet (5 mg total) by mouth every morning.  . meloxicam (MOBIC) 15 MG tablet Take 15 mg by mouth every morning.   Marland Kitchen  metFORMIN (GLUCOPHAGE) 1000 MG tablet Take 1 tablet (1,000 mg total) by mouth 2 (two) times daily with a meal.  . Multiple Vitamin (MULTIVITAMIN) tablet Take 1 tablet by mouth daily.  Marland Kitchen  omeprazole (PRILOSEC) 20 MG capsule Take 20 mg by mouth every morning.   . Vitamin D, Ergocalciferol, (DRISDOL) 1.25 MG (50000 UT) CAPS capsule Take 1 capsule (50,000 Units total) by mouth once a week.    Cardiac Studies:   Coronary Angiogram [11/29/2013]: LVEF 55-60%, LAD: mild diffuse proximal calcification, otherwise normal coronary arteries.  Echocardiogram [12/21/2013]:  Left ventricle cavity is normal in size. Moderate concentric hypertrophy of the left ventricle. Normal global wall motion. Calculated EF- 66%. Doppler evidence of grade I (impaired) diastolic dysfunction with elevated LV filling pressure. - Left atrial cavity is mildly dilated. - Right ventricle cavity is slightly dilated. Normal right ventricular function. - - Structurally normal tricuspid valve with trace regurgitation. No evidence of pulmonary hypertension.  Carotid Doppler [12/27/2013]: No evidence of hemodynamically significant stenosis in the bilateral carotid bifurcation vessels.  Assessment:   Atypical chest pain - Plan: EKG 12-Lead  Dyspnea on exertion - Plan: EKG 12-Lead  Coronary artery disease involving native coronary artery of native heart without angina pectoris  Essential hypertension  Class 3 severe obesity with serious comorbidity and body mass index (BMI) of 40.0 to 44.9 in adult, unspecified obesity type (HCC)  Bilateral leg edema - Plan: PCV ECHOCARDIOGRAM COMPLETE, Basic metabolic panel  EKG [id number]: Sinus bradycardia at 54 bpm, normal axis, low-voltage complexes, no evidence of ischemia.   Recommendations:   Since being discharged from the hospital with Covid, she has had central chest pressure not worsened by exertion or movement.  She has also had worsening leg edema particularly at the end of the day.  She is noted to have 2+ pitting edema on exam today.  She admits to 10 pound weight gain, but is now making diet changes to help with this.  I suspect her symptoms are related to  her weight gain and recent illness.  She previously has had diastolic dysfunction on echocardiogram.  She does have dyspnea on exertion, but has been stable and is actually been able to discontinue oxygen therapy.  I will obtain echocardiogram to exclude any structural abnormalities.  Her blood pressure is also elevated and given her leg edema, will start Aldactone 25 mg daily.  Will check BMP in 10 days for surveillance of kidney function.  Do not feel that she needs stress testing at this time as she is able to do her normal activities without worsening chest discomfort.  EKG is also without acute abnormalities.  She continues to have chest discomfort, may consider this.  Palpitations have been stable on bisoprolol.  I have encouraged her to make diet changes to continue to help with weight loss and also sodium restriction.  Advised her to avoid salt substitute to avoid hyperkalemia.  Encouraged leg elevation while sitting.  I will see her back in 4 weeks after her echocardiogram for follow-up, but encouraged her to contact me sooner if needed.    Miquel Dunn, MSN, APRN, FNP-C [facility]. [place] Office: [phone] Fax: [phone]

## 2018-11-03 ENCOUNTER — Encounter: Payer: Self-pay | Admitting: Cardiology

## 2018-11-10 ENCOUNTER — Other Ambulatory Visit: Payer: Self-pay

## 2018-11-10 ENCOUNTER — Ambulatory Visit (INDEPENDENT_AMBULATORY_CARE_PROVIDER_SITE_OTHER): Payer: Medicare Other | Admitting: Family Medicine

## 2018-11-10 VITALS — BP 145/71 | HR 58 | Temp 98.1°F | Ht 63.0 in | Wt 247.2 lb

## 2018-11-10 DIAGNOSIS — E559 Vitamin D deficiency, unspecified: Secondary | ICD-10-CM | POA: Diagnosis not present

## 2018-11-10 DIAGNOSIS — Z6841 Body Mass Index (BMI) 40.0 and over, adult: Secondary | ICD-10-CM

## 2018-11-10 DIAGNOSIS — E1165 Type 2 diabetes mellitus with hyperglycemia: Secondary | ICD-10-CM

## 2018-11-10 MED ORDER — VITAMIN D (ERGOCALCIFEROL) 1.25 MG (50000 UNIT) PO CAPS: 50000.0000 [IU] | ORAL_CAPSULE | ORAL | 0 refills | Status: DC

## 2018-11-15 NOTE — Progress Notes (Signed)
Office: [phone]  /  Fax: [phone]   HPI:   Chief Complaint: OBESITY [name] [name] is here to discuss her progress with her obesity treatment plan. She is on the Category 2 plan and is following her eating plan approximately 85 % of the time. She states she is walking 10 minutes 7 times per week. [name] [name] is doing very well on the plan. She is not always getting her protein in at dinner. Her weight is 247 lb 3.2 oz (112.1 kg) today and has had a weight loss of 3 pounds over a period of 2 weeks since her last visit. She has lost 1 lb since starting treatment with [name].  Vitamin D deficiency [name] [name] has a diagnosis of vitamin D deficiency. [name] [name] is currently taking vit D and she is nearly at goal (45.6 on 10/27/18). She denies nausea, vomiting or muscle weakness.  Diabetes II with hyperglycemia, non-insulin [name] [name] has a diagnosis of diabetes type II, and she is not well controlled. Last A1c was at 7.4 on 10/27/18. [name] [name] states fasting BGs range between 100 and 120 and 2 hour post prandial BGs range in the 140's and is worse with extra carbohydrates. She is on Tradjenta, Glimepiride and Metformin. [name] [name] denies any hypoglycemic episodes. She has been working on intensive lifestyle modifications including diet, exercise, and weight loss to help control her blood glucose levels.  ASSESSMENT AND PLAN:  Vitamin D deficiency - Plan: Vitamin D, Ergocalciferol, (DRISDOL) 1.25 MG (50000 UT) CAPS capsule  Type 2 diabetes mellitus with hyperglycemia, without long-term current use of insulin (HCC)  Class 3 severe obesity with serious comorbidity and body mass index (BMI) of 40.0 to 44.9 in adult, unspecified obesity type ([place])  PLAN:  Vitamin D Deficiency [name] [name] was informed that low vitamin D levels contributes to fatigue and are associated with obesity, breast, and colon cancer. [name] [name] agrees to continue to take prescription Vit D @50 ,000 IU every week #4 with no refills and she will  follow up for routine testing of vitamin D, at least 2-3 times per year. She was informed of the risk of over-replacement of vitamin D and agrees to not increase her dose unless she discusses this with [name] first. [name] [name] agrees to follow up with our clinic in 2 weeks.  Diabetes II with hyperglycemia, non-insulin [name] [name] has been given extensive diabetes education by myself today including ideal fasting and post-prandial blood glucose readings, individual ideal Hgb A1c goals and hypoglycemia prevention. We discussed the importance of good blood sugar control to decrease the likelihood of diabetic complications such as nephropathy, neuropathy, limb loss, blindness, coronary artery disease, and death. We discussed the importance of intensive lifestyle modification including diet, exercise and weight loss as the first line treatment for diabetes. [name] [name] agrees to continue all medications at the current doses and follow up at the agreed upon time.  Obesity [name] [name] is currently in the action stage of change. As such, her goal is to continue with weight loss efforts She has agreed to follow the Category 2 plan [name] [name] will continue her current exercise regimen for weight loss and overall health benefits. We discussed the following Behavioral Modification Strategies today: planning for success, increasing lean protein intake and decreasing simple carbohydrates   [name] [name] has agreed to follow up with our clinic in 2 weeks. She was informed of the importance of frequent follow up visits to maximize her success with intensive lifestyle modifications for her multiple health conditions.  ALLERGIES: Allergies  Allergen Reactions  . Codeine Palpitations  MEDICATIONS: Current Outpatient Medications on File Prior to Visit  Medication Sig Dispense Refill  . ACCU-CHEK AVIVA PLUS test strip U UTD  5  . ACCU-CHEK SOFTCLIX LANCETS lancets U UTD  11  . amLODipine (NORVASC) 10 MG tablet Take 1 tablet (10 mg  total) by mouth daily. (Patient taking differently: Take 10 mg by mouth every morning. ) 30 tablet 2  . atorvastatin (LIPITOR) 80 MG tablet Take 1 tablet by mouth every evening.   5  . benazepril (LOTENSIN) 40 MG tablet Take 40 mg by mouth every evening.     . bisoprolol (ZEBETA) 10 MG tablet TAKE 1 TABLET BY MOUTH EVERY DAY 90 tablet 1  . cetirizine (ZYRTEC) 10 MG tablet Take 10 mg by mouth at bedtime.     . chlorthalidone (HYGROTON) 25 MG tablet Take 25 mg by mouth every morning. 2 table once daily  3  . escitalopram (LEXAPRO) 20 MG tablet Take 20 mg by mouth every evening.     . fluorouracil (EFUDEX) 5 % cream APPLY CREAM BID FOR 4 WEEKS TO THE ARMS. APPLY BID FOR 2 WEEKS TO THE FACE    . glimepiride (AMARYL) 4 MG tablet Take 2 mg by mouth daily with breakfast. 1/2 tablet daily    . linagliptin (TRADJENTA) 5 MG TABS tablet Take 1 tablet (5 mg total) by mouth every morning. 30 tablet 0  . meloxicam (MOBIC) 15 MG tablet Take 15 mg by mouth every morning.     . metFORMIN (GLUCOPHAGE) 1000 MG tablet Take 1 tablet (1,000 mg total) by mouth 2 (two) times daily with a meal. 60 tablet 0  . Multiple Vitamin (MULTIVITAMIN) tablet Take 1 tablet by mouth daily.    Marland Kitchen omeprazole (PRILOSEC) 20 MG capsule Take 20 mg by mouth every morning.     Marland Kitchen spironolactone (ALDACTONE) 25 MG tablet TAKE 1 TABLET BY MOUTH DAILY 90 tablet 1   No current facility-administered medications on file prior to visit.     PAST MEDICAL HISTORY: Past Medical History:  Diagnosis Date  . Carpal tunnel syndrome of right wrist   . Coronary artery disease    per cath 11-29-2013  mild diffuse CAD and calcification in proximal LAD  . Depression   . Diverticulosis   . GERD (gastroesophageal reflux disease)   . History of endometrial cancer 05/2003   FIGO, Grade 1  s/p  TAG w/ BSO  . HTN (hypertension)   . Hyperlipidemia   . OA (osteoarthritis)   . OSA on CPAP    followed by dr Elsworth Soho-- per study 03-26-2012  mod. to severe OSA ,  AHI 37/hr  . Type II diabetes mellitus ([place])    followed by pcp  . Urinary incontinence in female   . Vitamin D deficiency     PAST SURGICAL HISTORY: Past Surgical History:  Procedure Laterality Date  . CARDIAC CATHETERIZATION  11/29/2013   dr Einar Gip   mild diffuse noncritial CAD and mild calcification involving the proximal LAD,  LVEF 55-60%  . CARPAL TUNNEL RELEASE Right 07/17/2017   Procedure: CARPAL TUNNEL RELEASE;  Surgeon: Dorna Leitz, MD;  Location: [facility];  Service: Orthopedics;  Laterality: Right;  . KNEE ARTHROSCOPY Left 10/ 2004;  05-17-2008   dr graves  . KNEE ARTHROSCOPY Right 11/2012  . LAPAROSCOPIC CHOLECYSTECTOMY  11-19-2006  dr Zella Richer  [facility]  . LEFT HEART CATHETERIZATION WITH CORONARY ANGIOGRAM N/A 11/29/2013   Procedure: LEFT HEART CATHETERIZATION WITH CORONARY ANGIOGRAM;  Surgeon: Laverda Page,  MD;  Location: [facility] CATH [facility];  Service: Cardiovascular;  Laterality: N/A;  . TOTAL ABDOMINAL HYSTERECTOMY W/ BILATERAL SALPINGOOPHORECTOMY  06-20-2003    dr Mohammed Kindle  [facility]   endometrial cancer  . TOTAL HIP ARTHROPLASTY Right 09/02/2013   Procedure: RIGHT TOTAL HIP ARTHROPLASTY ANTERIOR APPROACH;  Surgeon: Alta Corning, MD;  Location: [facility];  Service: Orthopedics;  Laterality: Right;  . TOTAL KNEE ARTHROPLASTY Left 08/02/2012   Procedure: TOTAL KNEE ARTHROPLASTY;  Surgeon: Alta Corning, MD;  Location: [facility];  Service: Orthopedics;  Laterality: Left;  left total knee arthroplasty  . TUBAL LIGATION  yrs ago    SOCIAL HISTORY: Social History   Tobacco Use  . Smoking status: Never Smoker  . Smokeless tobacco: Never Used  Substance Use Topics  . Alcohol use: No  . Drug use: No    FAMILY HISTORY: Family History  Problem Relation Age of Onset  . Heart disease Father 76       CABG  . Lung cancer Father 30       lung  . Hypertension Father   . Diabetes Father   . High Cholesterol Father   . Diabetes type II Father   . Heart disease Sister 39        CABG  . Diabetes Sister 86  . Heart disease Brother 102       MI with stents  . Hypertension Mother   . Diabetes Mother   . High Cholesterol Mother   . Sudden death Mother   . Thyroid disease Mother   . Obesity Mother   . Hypertension Sister        x2   . Diabetes Sister   . Hypertension Brother   . Diabetes Brother   . Cancer Sister        high grade Neuroendocrine carcinoma right axilla  . Pancreatic cancer Other        x 3 - 2 MAunts and 1 P Aunts.   . Lupus Other        Pat Aunt    ROS: Review of Systems  Constitutional: Positive for weight loss.  Gastrointestinal: Negative for nausea and vomiting.  Musculoskeletal:       Negative for muscle weakness  Endo/Heme/Allergies:       Negative for hypoglycemia    PHYSICAL EXAM: Blood pressure (!) 145/71, pulse (!) 58, temperature 98.1 F (36.7 C), temperature source Oral, height 5\' 3"  (1.6 m), weight 247 lb 3.2 oz (112.1 kg), last menstrual period 11/25/2002, SpO2 97 %. Body mass index is 43.79 kg/m. Physical Exam Vitals signs reviewed.  Constitutional:      Appearance: Normal appearance. She is well-developed. She is obese.  Cardiovascular:     Rate and Rhythm: Normal rate.  Pulmonary:     Effort: Pulmonary effort is normal.  Musculoskeletal: Normal range of motion.  Skin:    General: Skin is warm and dry.  Neurological:     Mental Status: She is alert and oriented to person, place, and time.  Psychiatric:        Mood and Affect: Mood normal.        Behavior: Behavior normal.     RECENT LABS AND TESTS: BMET    Component Value Date/Time   NA 140 10/27/2018 0945   K 4.2 10/27/2018 0945   CL 99 10/27/2018 0945   CO2 24 10/27/2018 0945   GLUCOSE 109 (H) 10/27/2018 0945   GLUCOSE 134 (H) 07/09/2018 0457   BUN 27  10/27/2018 0945   CREATININE 0.88 10/27/2018 0945   CREATININE 1.24 (H) 07/16/2015 1339   CALCIUM 9.5 10/27/2018 0945   GFRNONAA 68 10/27/2018 0945   GFRAA 79 10/27/2018 0945   Lab Results   Component Value Date   HGBA1C 7.4 (H) 10/27/2018   HGBA1C 6.7 (H) 02/01/2018   HGBA1C 6.7 (H) 10/06/2017   HGBA1C 6.9 (H) 04/29/2017   HGBA1C 7.0 (H) 07/16/2015   Lab Results  Component Value Date   INSULIN 21.4 02/01/2018   INSULIN 24.9 10/06/2017   INSULIN 36.0 (H) 04/29/2017   CBC    Component Value Date/Time   WBC 15.7 (H) 07/09/2018 0457   RBC 4.64 07/09/2018 0457   HGB 13.3 07/09/2018 0457   HGB 13.1 04/29/2017 0935   HGB 11.2 (L) 07/11/2014 1340   HCT 41.4 07/09/2018 0457   HCT 39.4 04/29/2017 0935   PLT 334 07/09/2018 0457   MCV 89.2 07/09/2018 0457   MCV 88 04/29/2017 0935   MCH 28.7 07/09/2018 0457   MCHC 32.1 07/09/2018 0457   RDW 13.0 07/09/2018 0457   RDW 14.2 04/29/2017 0935   LYMPHSABS 1.1 07/05/2018 0535   LYMPHSABS 2.1 04/29/2017 0935   MONOABS 0.5 07/05/2018 0535   EOSABS 0.0 07/05/2018 0535   EOSABS 0.2 04/29/2017 0935   BASOSABS 0.1 07/05/2018 0535   BASOSABS 0.0 04/29/2017 0935   Iron/TIBC/Ferritin/ %Sat    Component Value Date/Time   FERRITIN 240 07/09/2018 0458   Lipid Panel     Component Value Date/Time   CHOL 214 (H) 10/27/2018 0945   TRIG 392 (H) 10/27/2018 0945   HDL 35 (L) 10/27/2018 0945   CHOLHDL 5.9 (H) 07/16/2015 1339   VLDL NOT CALC 07/16/2015 1339   LDLCALC 115 (H) 02/01/2018 1125   Hepatic Function Panel     Component Value Date/Time   PROT 6.7 10/27/2018 0945   ALBUMIN 4.3 10/27/2018 0945   AST 18 10/27/2018 0945   ALT 27 10/27/2018 0945   ALKPHOS 53 10/27/2018 0945   BILITOT 0.3 10/27/2018 0945      Component Value Date/Time   TSH 2.300 10/27/2018 0945   TSH 3.010 04/29/2017 0935   TSH 3.96 07/16/2015 1339     Ref. Range 10/27/2018 09:45  Vitamin D, 25-Hydroxy Latest Ref Range: 30.0 - 100.0 ng/mL 45.6    OBESITY BEHAVIORAL INTERVENTION VISIT  Today's visit was # 24  Starting weight: 248 lbs Starting date: 04/29/2017 Today's weight : 247 lbs Today's date: 11/10/2018 Total lbs lost to date: 1     11/10/2018  Height 5\' 3"  (1.6 m)  Weight 247 lb 3.2 oz (112.1 kg)  BMI (Calculated) 43.8  BLOOD PRESSURE - SYSTOLIC [id number]  BLOOD PRESSURE - DIASTOLIC 71   Body Fat % 123456 %  Total Body Water (lbs) 87.8 lbs    ASK: We discussed the diagnosis of obesity with [name] [name] today and Tarissa agreed to give [name] permission to discuss obesity behavioral modification therapy today.  ASSESS: Shalice has the diagnosis of obesity and her BMI today is 30.8 Faithe is in the action stage of change   ADVISE: Tynnetta was educated on the multiple health risks of obesity as well as the benefit of weight loss to improve her health. She was advised of the need for long term treatment and the importance of lifestyle modifications to improve her current health and to decrease her risk of future health problems.  AGREE: Multiple dietary modification options and treatment options were discussed and  [name]  agreed to follow the recommendations documented in the above note.  ARRANGE: Zikia was educated on the importance of frequent visits to treat obesity as outlined per CMS and USPSTF guidelines and agreed to schedule her next follow up appointment today.  I, Doreene Nest, am acting as transcriptionist for [employer], FNP-C  I have reviewed the above documentation for accuracy and completeness, and I agree with the above.  - Kinzy Weyers, FNP-C.

## 2018-11-16 ENCOUNTER — Other Ambulatory Visit: Payer: Self-pay

## 2018-11-16 ENCOUNTER — Ambulatory Visit (INDEPENDENT_AMBULATORY_CARE_PROVIDER_SITE_OTHER): Payer: Medicare Other

## 2018-11-16 DIAGNOSIS — R600 Localized edema: Secondary | ICD-10-CM

## 2018-11-17 ENCOUNTER — Encounter (INDEPENDENT_AMBULATORY_CARE_PROVIDER_SITE_OTHER): Payer: Self-pay | Admitting: Family Medicine

## 2018-11-17 DIAGNOSIS — R600 Localized edema: Secondary | ICD-10-CM | POA: Diagnosis not present

## 2018-11-18 LAB — BASIC METABOLIC PANEL
BUN/Creatinine Ratio: 37 — ABNORMAL HIGH (ref 12–28)
BUN: 34 mg/dL — ABNORMAL HIGH (ref 8–27)
CO2: 21 mmol/L (ref 20–29)
Calcium: 9.9 mg/dL (ref 8.7–10.3)
Chloride: 100 mmol/L (ref 96–106)
Creatinine, Ser: 0.92 mg/dL (ref 0.57–1.00)
GFR calc Af Amer: 75 mL/min/{1.73_m2} (ref >59)
GFR calc non Af Amer: 65 mL/min/{1.73_m2} (ref >59)
Glucose: 147 mg/dL — ABNORMAL HIGH (ref 65–99)
Potassium: 4.8 mmol/L (ref 3.5–5.2)
Sodium: 137 mmol/L (ref 134–144)

## 2018-11-24 ENCOUNTER — Encounter (INDEPENDENT_AMBULATORY_CARE_PROVIDER_SITE_OTHER): Payer: Self-pay | Admitting: Family Medicine

## 2018-11-24 ENCOUNTER — Other Ambulatory Visit: Payer: Self-pay

## 2018-11-24 ENCOUNTER — Ambulatory Visit (INDEPENDENT_AMBULATORY_CARE_PROVIDER_SITE_OTHER): Payer: Medicare Other | Admitting: Family Medicine

## 2018-11-24 VITALS — BP 125/71 | HR 55 | Temp 98.2°F | Ht 63.0 in | Wt 249.2 lb

## 2018-11-24 DIAGNOSIS — Z6841 Body Mass Index (BMI) 40.0 and over, adult: Secondary | ICD-10-CM

## 2018-11-24 DIAGNOSIS — E66813 Obesity, class 3: Secondary | ICD-10-CM

## 2018-11-24 DIAGNOSIS — E1165 Type 2 diabetes mellitus with hyperglycemia: Secondary | ICD-10-CM

## 2018-11-24 NOTE — Progress Notes (Signed)
Office: [phone]  /  Fax: [phone]   HPI:   Chief Complaint: OBESITY [name] [name] is here to discuss her progress with her obesity treatment plan. She is on the Category 2 plan and is following her eating plan approximately 80% of the time. She states she is exercising 0 minutes 0 times per week. [name] [name] has been visiting her sister and she was off the plan. They cooked things that were off the plan and she ate them. She is up 2 lbs but denies shortness of breath or orthopnea. She will be visiting her granddaughter this weekend but reports she will stay on the plan. Her weight is 249 lb 3.2 oz (113 kg) today and has had a weight gain of 2 lbs since her last visit. She has lost 0 lbs since starting treatment with [name].  Diabetes Mellitus [name] [name] has a diagnosis of diabetes type II, which is not well controlled. [name] [name] states fasting blood sugars range between 99 and 121; 2-hour postprandials range between 147 and 179. Last A1c was 7.4 on 10/27/2018. She states she is compliant with Tradjenta, metformin, and glimepiride. She has been working on intensive lifestyle modifications including diet, exercise, and weight loss to help control her blood glucose levels. No hypoglycemia.  ASSESSMENT AND PLAN:  Type 2 diabetes mellitus without complication, without long-term current use of insulin (HCC)  Class 3 severe obesity with serious comorbidity and body mass index (BMI) of 40.0 to 44.9 in adult, unspecified obesity type ([place])  PLAN:  Diabetes Mellitus [name] [name] has been given extensive diabetes education by myself today including ideal fasting and post-prandial blood glucose readings, individual ideal HgA1c goals  and hypoglycemia prevention. We discussed the importance of good blood sugar control to decrease the likelihood of diabetic complications such as nephropathy, neuropathy, limb loss, blindness, coronary artery disease, and death. We discussed the importance of intensive lifestyle  modification including diet, exercise and weight loss as the first line treatment for diabetes. [name] [name] agrees to continue her diabetes medications and will follow-up at the agreed upon time.  Obesity [name] [name] is currently in the action stage of change. As such, her goal is to continue with weight loss efforts. She has agreed to follow the Category 2 plan. [name] [name] has not been prescribed exercise at this time.  We discussed the following Behavioral Modification Strategies today: increasing lean protein intake and planning for success.  [name] [name] has agreed to follow-up with our clinic in 2 weeks. She was informed of the importance of frequent follow-up visits to maximize her success with intensive lifestyle modifications for her multiple health conditions.  ALLERGIES: Allergies  Allergen Reactions  . Codeine Palpitations    MEDICATIONS: Current Outpatient Medications on File Prior to Visit  Medication Sig Dispense Refill  . ACCU-CHEK AVIVA PLUS test strip U UTD  5  . ACCU-CHEK SOFTCLIX LANCETS lancets U UTD  11  . amLODipine (NORVASC) 10 MG tablet Take 1 tablet (10 mg total) by mouth daily. (Patient taking differently: Take 10 mg by mouth every morning. ) 30 tablet 2  . atorvastatin (LIPITOR) 80 MG tablet Take 1 tablet by mouth every evening.   5  . benazepril (LOTENSIN) 40 MG tablet Take 40 mg by mouth every evening.     . bisoprolol (ZEBETA) 10 MG tablet TAKE 1 TABLET BY MOUTH EVERY DAY 90 tablet 1  . cetirizine (ZYRTEC) 10 MG tablet Take 10 mg by mouth at bedtime.     . chlorthalidone (HYGROTON) 25 MG tablet Take 25 mg by  mouth every morning. 2 table once daily  3  . escitalopram (LEXAPRO) 20 MG tablet Take 20 mg by mouth every evening.     . fluorouracil (EFUDEX) 5 % cream APPLY CREAM BID FOR 4 WEEKS TO THE ARMS. APPLY BID FOR 2 WEEKS TO THE FACE    . glimepiride (AMARYL) 4 MG tablet Take 2 mg by mouth daily with breakfast. 1/2 tablet daily    . linagliptin (TRADJENTA) 5 MG TABS  tablet Take 1 tablet (5 mg total) by mouth every morning. 30 tablet 0  . meloxicam (MOBIC) 15 MG tablet Take 15 mg by mouth every morning.     . metFORMIN (GLUCOPHAGE) 1000 MG tablet Take 1 tablet (1,000 mg total) by mouth 2 (two) times daily with a meal. 60 tablet 0  . Multiple Vitamin (MULTIVITAMIN) tablet Take 1 tablet by mouth daily.    Marland Kitchen omeprazole (PRILOSEC) 20 MG capsule Take 20 mg by mouth every morning.     Marland Kitchen spironolactone (ALDACTONE) 25 MG tablet TAKE 1 TABLET BY MOUTH DAILY 90 tablet 1  . Vitamin D, Ergocalciferol, (DRISDOL) 1.25 MG (50000 UT) CAPS capsule Take 1 capsule (50,000 Units total) by mouth once a week. 4 capsule 0   No current facility-administered medications on file prior to visit.     PAST MEDICAL HISTORY: Past Medical History:  Diagnosis Date  . Carpal tunnel syndrome of right wrist   . Coronary artery disease    per cath 11-29-2013  mild diffuse CAD and calcification in proximal LAD  . Depression   . Diverticulosis   . GERD (gastroesophageal reflux disease)   . History of endometrial cancer 05/2003   FIGO, Grade 1  s/p  TAG w/ BSO  . HTN (hypertension)   . Hyperlipidemia   . OA (osteoarthritis)   . OSA on CPAP    followed by dr Elsworth Soho-- per study 03-26-2012  mod. to severe OSA , AHI 37/hr  . Type II diabetes mellitus ([place])    followed by pcp  . Urinary incontinence in female   . Vitamin D deficiency     PAST SURGICAL HISTORY: Past Surgical History:  Procedure Laterality Date  . CARDIAC CATHETERIZATION  11/29/2013   dr Einar Gip   mild diffuse noncritial CAD and mild calcification involving the proximal LAD,  LVEF 55-60%  . CARPAL TUNNEL RELEASE Right 07/17/2017   Procedure: CARPAL TUNNEL RELEASE;  Surgeon: Dorna Leitz, MD;  Location: [place];  Service: Orthopedics;  Laterality: Right;  . KNEE ARTHROSCOPY Left 10/ 2004;  05-17-2008   dr graves  . KNEE ARTHROSCOPY Right 11/2012  . LAPAROSCOPIC CHOLECYSTECTOMY  11-19-2006  dr [name] [name]   [facility]  . LEFT HEART CATHETERIZATION WITH CORONARY ANGIOGRAM N/A 11/29/2013   Procedure: LEFT HEART CATHETERIZATION WITH CORONARY ANGIOGRAM;  Surgeon: Laverda Page, MD;  Location: [facility] CATH LAB;  Service: Cardiovascular;  Laterality: N/A;  . TOTAL ABDOMINAL HYSTERECTOMY W/ BILATERAL SALPINGOOPHORECTOMY  06-20-2003    dr Mohammed Kindle  [facility]   endometrial cancer  . TOTAL HIP ARTHROPLASTY Right 09/02/2013   Procedure: RIGHT TOTAL HIP ARTHROPLASTY ANTERIOR APPROACH;  Surgeon: Alta Corning, MD;  Location: [place];  Service: Orthopedics;  Laterality: Right;  . TOTAL KNEE ARTHROPLASTY Left 08/02/2012   Procedure: TOTAL KNEE ARTHROPLASTY;  Surgeon: Alta Corning, MD;  Location: [place];  Service: Orthopedics;  Laterality: Left;  left total knee arthroplasty  . TUBAL LIGATION  yrs ago    SOCIAL HISTORY: Social History   Tobacco Use  .  Smoking status: Never Smoker  . Smokeless tobacco: Never Used  Substance Use Topics  . Alcohol use: No  . Drug use: No    FAMILY HISTORY: Family History  Problem Relation Age of Onset  . Heart disease Father 40       CABG  . Lung cancer Father 92       lung  . Hypertension Father   . Diabetes Father   . High Cholesterol Father   . Diabetes type II Father   . Heart disease Sister 50       CABG  . Diabetes Sister 33  . Heart disease Brother 44       MI with stents  . Hypertension Mother   . Diabetes Mother   . High Cholesterol Mother   . Sudden death Mother   . Thyroid disease Mother   . Obesity Mother   . Hypertension Sister        x2   . Diabetes Sister   . Hypertension Brother   . Diabetes Brother   . Cancer Sister        high grade Neuroendocrine carcinoma right axilla  . Pancreatic cancer Other        x 3 - 2 MAunts and 1 P Aunts.   . Lupus Other        Pat Aunt   ROS: Review of Systems  Endo/Heme/Allergies:       Negative for hypoglycemia.   PHYSICAL EXAM: Blood pressure 125/71, pulse (!) 55, temperature 98.2 F (36.8 C), temperature  source Oral, height 5\' 3"  (1.6 m), weight 249 lb 3.2 oz (113 kg), last menstrual period 11/25/2002, SpO2 97 %. Body mass index is 44.14 kg/m. Physical Exam Vitals signs reviewed.  Constitutional:      Appearance: Normal appearance. She is obese.  Cardiovascular:     Rate and Rhythm: Normal rate.     Pulses: Normal pulses.  Pulmonary:     Effort: Pulmonary effort is normal.     Breath sounds: Normal breath sounds.  Musculoskeletal: Normal range of motion.  Skin:    General: Skin is warm and dry.  Neurological:     Mental Status: She is alert and oriented to person, place, and time.  Psychiatric:        Behavior: Behavior normal.   RECENT LABS AND TESTS: BMET    Component Value Date/Time   NA 137 11/17/2018 1052   K 4.8 11/17/2018 1052   CL 100 11/17/2018 1052   CO2 21 11/17/2018 1052   GLUCOSE 147 (H) 11/17/2018 1052   GLUCOSE 134 (H) 07/09/2018 0457   BUN 34 (H) 11/17/2018 1052   CREATININE 0.92 11/17/2018 1052   CREATININE 1.24 (H) 07/16/2015 1339   CALCIUM 9.9 11/17/2018 1052   GFRNONAA 65 11/17/2018 1052   GFRAA 75 11/17/2018 1052   Lab Results  Component Value Date   HGBA1C 7.4 (H) 10/27/2018   HGBA1C 6.7 (H) 02/01/2018   HGBA1C 6.7 (H) 10/06/2017   HGBA1C 6.9 (H) 04/29/2017   HGBA1C 7.0 (H) 07/16/2015   Lab Results  Component Value Date   INSULIN 21.4 02/01/2018   INSULIN 24.9 10/06/2017   INSULIN 36.0 (H) 04/29/2017   CBC    Component Value Date/Time   WBC 15.7 (H) 07/09/2018 0457   RBC 4.64 07/09/2018 0457   HGB 13.3 07/09/2018 0457   HGB 13.1 04/29/2017 0935   HGB 11.2 (L) 07/11/2014 1340   HCT 41.4 07/09/2018 0457   HCT 39.4 04/29/2017 0935  PLT 334 07/09/2018 0457   MCV 89.2 07/09/2018 0457   MCV 88 04/29/2017 0935   MCH 28.7 07/09/2018 0457   MCHC 32.1 07/09/2018 0457   RDW 13.0 07/09/2018 0457   RDW 14.2 04/29/2017 0935   LYMPHSABS 1.1 07/05/2018 0535   LYMPHSABS 2.1 04/29/2017 0935   MONOABS 0.5 07/05/2018 0535   EOSABS 0.0  07/05/2018 0535   EOSABS 0.2 04/29/2017 0935   BASOSABS 0.1 07/05/2018 0535   BASOSABS 0.0 04/29/2017 0935   Iron/TIBC/Ferritin/ %Sat    Component Value Date/Time   FERRITIN 240 07/09/2018 0458   Lipid Panel     Component Value Date/Time   CHOL 214 (H) 10/27/2018 0945   TRIG 392 (H) 10/27/2018 0945   HDL 35 (L) 10/27/2018 0945   CHOLHDL 5.9 (H) 07/16/2015 1339   VLDL NOT CALC 07/16/2015 1339   LDLCALC 111 (H) 10/27/2018 0945   Hepatic Function Panel     Component Value Date/Time   PROT 6.7 10/27/2018 0945   ALBUMIN 4.3 10/27/2018 0945   AST 18 10/27/2018 0945   ALT 27 10/27/2018 0945   ALKPHOS 53 10/27/2018 0945   BILITOT 0.3 10/27/2018 0945      Component Value Date/Time   TSH 2.300 10/27/2018 0945   TSH 3.010 04/29/2017 0935   TSH 3.96 07/16/2015 1339   Results for LIONEL, EYERMAN (MRN [id number]) as of 11/24/2018 13:38  Ref. Range 10/27/2018 09:45  Vitamin D, 25-Hydroxy Latest Ref Range: 30.0 - 100.0 ng/mL 45.6   OBESITY BEHAVIORAL INTERVENTION VISIT  Today's visit was #25  Starting weight: 248 lbs Starting date: 04/29/2017 Today's weight: 249 lbs  Today's date: 11/24/2018 Total lbs lost to date: 0 At least 15 minutes were spent on discussing the following behavioral intervention visit.    11/24/2018  Height 5\' 3"  (1.6 m)  Weight 249 lb 3.2 oz (113 kg)  BMI (Calculated) 44.15  BLOOD PRESSURE - SYSTOLIC 0000000  BLOOD PRESSURE - DIASTOLIC 71   Body Fat % [id number] %   ASK: We discussed the diagnosis of obesity with [name] [name] today and [name] [name] agreed to give [name] permission to discuss obesity behavioral modification therapy today.  ASSESS: [name] [name] has the diagnosis of obesity and her BMI today is 44.1. [name] [name] is in the action stage of change.   ADVISE: [name] [name] was educated on the multiple health risks of obesity as well as the benefit of weight loss to improve her health. She was advised of the need for long term treatment and the importance of  lifestyle modifications to improve her current health and to decrease her risk of future health problems.  AGREE: Multiple dietary modification options and treatment options were discussed and  [name] [name] agreed to follow the recommendations documented in the above note.  ARRANGE: [name] [name] was educated on the importance of frequent visits to treat obesity as outlined per CMS and USPSTF guidelines and agreed to schedule her next follow up appointment today.  IMichaelene Song, am acting as Location manager for [employer], FNP I have reviewed the above documentation for accuracy and completeness, and I agree with the above.  - Bradi Arbuthnot, FNP-C.

## 2018-11-30 ENCOUNTER — Ambulatory Visit (INDEPENDENT_AMBULATORY_CARE_PROVIDER_SITE_OTHER): Payer: Medicare Other | Admitting: Cardiology

## 2018-11-30 ENCOUNTER — Other Ambulatory Visit: Payer: Self-pay

## 2018-11-30 ENCOUNTER — Encounter: Payer: Self-pay | Admitting: Cardiology

## 2018-11-30 VITALS — BP 142/63 | HR 62 | Ht 63.5 in | Wt 250.0 lb

## 2018-11-30 DIAGNOSIS — I5032 Chronic diastolic (congestive) heart failure: Secondary | ICD-10-CM | POA: Diagnosis not present

## 2018-11-30 DIAGNOSIS — Z6841 Body Mass Index (BMI) 40.0 and over, adult: Secondary | ICD-10-CM | POA: Diagnosis not present

## 2018-11-30 DIAGNOSIS — R0789 Other chest pain: Secondary | ICD-10-CM

## 2018-11-30 DIAGNOSIS — I10 Essential (primary) hypertension: Secondary | ICD-10-CM | POA: Diagnosis not present

## 2018-11-30 NOTE — Progress Notes (Signed)
Primary Physician:  Vicenta Aly, FNP   Patient ID: [name] [name], female    DOB: [birth year], 68 y.o.   MRN: [id number]  Subjective:    Chief Complaint  Patient presents with  . Edema    HPI: [name] [name]  is a 68 y.o. female  with history of mild coronary artery disease by coronary angiography in 2015 revealing mild LAD diffuse calcification and coronary atherosclerosis, diabetes mellitus which is recently uncontrolled, morbid obesity with obstructive sleep apnea recently established and is presently on CPAP since 2018, mixed hyperlipidemia, chronic palpitations with history of PACs and PVCs that have remained stable.  Patient was hospitalized on 05/6-5/19/2024 acute respiratory failure with hypoxia secondary to COVID-19.  She is now recovered.  She was initially discharged on oxygen therapy, but that is now been discontinued.  She is being followed by pulmonary.  She was recently seen for persistent chest discomfort described as chest heaviness and leg edema.  Symptoms were felt to be related to weight gain and diastolic dysfunction.  She underwent echocardiogram and was started on Aldactone and now presents to discuss results.  She continues to have chest pain, in the form of pressure, particularly at the end of the day. Does not notice any significant shortness of breath. Leg edema has resolved with addition of Aldactone and she is tolerating well. Blood pressure has also improved.   She has continue to work with the weight loss center for weight loss, but has been unsuccessful in losing significant weight loss recently.   Past Medical History:  Diagnosis Date  . Carpal tunnel syndrome of right wrist   . Coronary artery disease    per cath 11-29-2013  mild diffuse CAD and calcification in proximal LAD  . Depression   . Diverticulosis   . GERD (gastroesophageal reflux disease)   . History of endometrial cancer 05/2003   FIGO, Grade 1  s/p  TAG w/ BSO  . HTN  (hypertension)   . Hyperlipidemia   . OA (osteoarthritis)   . OSA on CPAP    followed by dr Elsworth Soho-- per study 03-26-2012  mod. to severe OSA , AHI 37/hr  . Type II diabetes mellitus ([place])    followed by pcp  . Urinary incontinence in female   . Vitamin D deficiency     Past Surgical History:  Procedure Laterality Date  . CARDIAC CATHETERIZATION  11/29/2013   dr Einar Gip   mild diffuse noncritial CAD and mild calcification involving the proximal LAD,  LVEF 55-60%  . CARPAL TUNNEL RELEASE Right 07/17/2017   Procedure: CARPAL TUNNEL RELEASE;  Surgeon: Dorna Leitz, MD;  Location: [place];  Service: Orthopedics;  Laterality: Right;  . KNEE ARTHROSCOPY Left 10/ 2004;  05-17-2008   dr graves  . KNEE ARTHROSCOPY Right 11/2012  . LAPAROSCOPIC CHOLECYSTECTOMY  11-19-2006  dr Zella Richer  [facility]  . LEFT HEART CATHETERIZATION WITH CORONARY ANGIOGRAM N/A 11/29/2013   Procedure: LEFT HEART CATHETERIZATION WITH CORONARY ANGIOGRAM;  Surgeon: Laverda Page, MD;  Location: [facility] CATH LAB;  Service: Cardiovascular;  Laterality: N/A;  . TOTAL ABDOMINAL HYSTERECTOMY W/ BILATERAL SALPINGOOPHORECTOMY  06-20-2003    dr Mohammed Kindle  [facility]   endometrial cancer  . TOTAL HIP ARTHROPLASTY Right 09/02/2013   Procedure: RIGHT TOTAL HIP ARTHROPLASTY ANTERIOR APPROACH;  Surgeon: Alta Corning, MD;  Location: [place];  Service: Orthopedics;  Laterality: Right;  . TOTAL KNEE ARTHROPLASTY Left 08/02/2012   Procedure: TOTAL KNEE ARTHROPLASTY;  Surgeon: Karen Chafe  Berenice Primas, MD;  Location: [place];  Service: Orthopedics;  Laterality: Left;  left total knee arthroplasty  . TUBAL LIGATION  yrs ago    Social History   Socioeconomic History  . Marital status: Married    Spouse name: Jahnya Stoermer  . Number of children: 3  . Years of education: Not on file  . Highest education level: Not on file  Occupational History  . Occupation: housewife  Social Needs  . Financial resource strain: Not on file  . Food insecurity     Worry: Not on file    Inability: Not on file  . Transportation needs    Medical: Not on file    Non-medical: Not on file  Tobacco Use  . Smoking status: Never Smoker  . Smokeless tobacco: Never Used  Substance and Sexual Activity  . Alcohol use: No  . Drug use: No  . Sexual activity: Not on file  Lifestyle  . Physical activity    Days per week: Not on file    Minutes per session: Not on file  . Stress: Not on file  Relationships  . Social Herbalist on phone: Not on file    Gets together: Not on file    Attends religious service: Not on file    Active member of club or organization: Not on file    Attends meetings of clubs or organizations: Not on file    Relationship status: Not on file  . Intimate partner violence    Fear of current or ex partner: Not on file    Emotionally abused: Not on file    Physically abused: Not on file    Forced sexual activity: Not on file  Other Topics Concern  . Not on file  Social History Narrative  . Not on file    Review of Systems  Constitution: Positive for weight gain. Negative for decreased appetite, malaise/fatigue and weight loss.  Eyes: Negative for visual disturbance.  Cardiovascular: Positive for chest pain and leg swelling. Negative for claudication, dyspnea on exertion, orthopnea, palpitations and syncope.  Respiratory: Positive for shortness of breath (improved). Negative for hemoptysis and wheezing.   Endocrine: Negative for cold intolerance and heat intolerance.  Hematologic/Lymphatic: Does not bruise/bleed easily.  Skin: Negative for nail changes.  Musculoskeletal: Negative for muscle weakness and myalgias.  Gastrointestinal: Negative for abdominal pain, change in bowel habit, nausea and vomiting.  Neurological: Negative for difficulty with concentration, dizziness, focal weakness and headaches.  Psychiatric/Behavioral: Negative for altered mental status and suicidal ideas.  All other systems reviewed and are  negative.     Objective:  Blood pressure (!) 142/63, pulse 62, height 5' 3.5" (1.613 m), weight 250 lb (113.4 kg), last menstrual period 11/25/2002, SpO2 94 %. Body mass index is 43.59 kg/m.    Physical Exam  Constitutional: She is oriented to person, place, and time. Vital signs are normal. She appears well-developed and well-nourished.  HENT:  Head: Normocephalic and atraumatic.  Neck: Normal range of motion.  Cardiovascular: Normal rate, regular rhythm and intact distal pulses.  Murmur heard.  Low-pitched early systolic murmur is present with a grade of 1/6 at the upper right sternal border. Pulses:      Femoral pulses are 1+ on the right side and 1+ on the left side.      Popliteal pulses are 1+ on the right side and 1+ on the left side.       Dorsalis pedis pulses are 2+ on the  right side and 2+ on the left side.       Posterior tibial pulses are 2+ on the right side and 2+ on the left side.  1+ pitting edema bilateral  Pulses difficult to feel due to bodily habitus  Pulmonary/Chest: Effort normal and breath sounds normal. No accessory muscle usage. No respiratory distress.  Abdominal: Soft. Bowel sounds are normal.  Musculoskeletal: Normal range of motion.  Neurological: She is alert and oriented to person, place, and time.  Skin: Skin is warm and dry.  Vitals reviewed.  Radiology: No results found.  Laboratory examination:    CMP Latest Ref Rng & Units 11/17/2018 10/27/2018 07/09/2018  Glucose 65 - 99 mg/dL 147(H) 109(H) 134(H)  BUN 8 - 27 mg/dL 34(H) 27 33(H)  Creatinine 0.57 - 1.00 mg/dL 0.92 0.88 0.80  Sodium 134 - 144 mmol/L 137 140 138  Potassium 3.5 - 5.2 mmol/L 4.8 4.2 3.7  Chloride 96 - 106 mmol/L 100 99 98  CO2 20 - 29 mmol/L 21 24 29   Calcium 8.7 - 10.3 mg/dL 9.9 9.5 8.8(L)  Total Protein 6.0 - 8.5 g/dL - 6.7 -  Total Bilirubin 0.0 - 1.2 mg/dL - 0.3 -  Alkaline Phos 39 - 117 IU/L - 53 -  AST 0 - 40 IU/L - 18 -  ALT 0 - 32 IU/L - 27 -   CBC Latest Ref  Rng & Units 07/09/2018 07/07/2018 07/06/2018  WBC 4.0 - 10.5 K/uL 15.7(H) 17.1(H) 15.8(H)  Hemoglobin 12.0 - 15.0 g/dL 13.3 12.9 12.8  Hematocrit 36.0 - 46.0 % 41.4 38.7 39.9  Platelets 150 - 400 K/uL 334 318 322   Lipid Panel     Component Value Date/Time   CHOL 214 (H) 10/27/2018 0945   TRIG 392 (H) 10/27/2018 0945   HDL 35 (L) 10/27/2018 0945   CHOLHDL 5.9 (H) 07/16/2015 1339   VLDL NOT CALC 07/16/2015 1339   LDLCALC 111 (H) 10/27/2018 0945   HEMOGLOBIN A1C Lab Results  Component Value Date   HGBA1C 7.4 (H) 10/27/2018   MPG 154 07/16/2015   TSH Recent Labs    10/27/18 0945  TSH 2.300    PRN Meds:. There are no discontinued medications. Current Meds  Medication Sig  . ACCU-CHEK AVIVA PLUS test strip U UTD  . ACCU-CHEK SOFTCLIX LANCETS lancets U UTD  . amLODipine (NORVASC) 10 MG tablet Take 1 tablet (10 mg total) by mouth daily. (Patient taking differently: Take 10 mg by mouth every morning. )  . atorvastatin (LIPITOR) 80 MG tablet Take 1 tablet by mouth every evening.   . benazepril (LOTENSIN) 40 MG tablet Take 40 mg by mouth every evening.   . bisoprolol (ZEBETA) 10 MG tablet TAKE 1 TABLET BY MOUTH EVERY DAY  . cetirizine (ZYRTEC) 10 MG tablet Take 10 mg by mouth at bedtime.   . chlorthalidone (HYGROTON) 25 MG tablet Take 25 mg by mouth every morning. 2 table once daily  . escitalopram (LEXAPRO) 20 MG tablet Take 20 mg by mouth every evening.   . fluorouracil (EFUDEX) 5 % cream APPLY CREAM BID FOR 4 WEEKS TO THE ARMS. APPLY BID FOR 2 WEEKS TO THE FACE  . glimepiride (AMARYL) 4 MG tablet Take 2 mg by mouth daily with breakfast. 1/2 tablet daily  . linagliptin (TRADJENTA) 5 MG TABS tablet Take 1 tablet (5 mg total) by mouth every morning.  . meloxicam (MOBIC) 15 MG tablet Take 15 mg by mouth every morning.   . metFORMIN (GLUCOPHAGE) 1000 MG  tablet Take 1 tablet (1,000 mg total) by mouth 2 (two) times daily with a meal.  . Multiple Vitamin (MULTIVITAMIN) tablet Take 1  tablet by mouth daily.  Marland Kitchen omeprazole (PRILOSEC) 20 MG capsule Take 20 mg by mouth every morning.   Marland Kitchen spironolactone (ALDACTONE) 25 MG tablet TAKE 1 TABLET BY MOUTH DAILY  . Vitamin D, Ergocalciferol, (DRISDOL) 1.25 MG (50000 UT) CAPS capsule Take 1 capsule (50,000 Units total) by mouth once a week.    Cardiac Studies:   Echocardiogram 11/16/2018:  Normal LV systolic function with EF 66%. Left ventricle cavity is normal in size. Moderate concentric hypertrophy of the left ventricle. Normal global wall motion. Doppler evidence of grade I (impaired) diastolic dysfunction, normal LAP. Calculated EF 66%. Left atrial cavity is moderately dilated at 4.5 cm. Right ventricle cavity is mildly dilated. Normal right ventricular function. Compared to the study done on 12/21/2013, no significant change.  Coronary Angiogram [11/29/2013]: LVEF 55-60%, LAD: mild diffuse proximal calcification, otherwise normal coronary arteries.  Carotid Doppler [12/27/2013]: No evidence of hemodynamically significant stenosis in the bilateral carotid bifurcation vessels.  Assessment:   Atypical chest pain - Plan: EKG 12-Lead, PCV MYOCARDIAL PERFUSION WITH LEXISCAN  Essential hypertension  Chronic diastolic (congestive) heart failure (HCC)  Class 3 severe obesity with serious comorbidity and body mass index (BMI) of 40.0 to 44.9 in adult, unspecified obesity type ([place])  EKG 11/02/2018: Sinus bradycardia at 54 bpm, normal axis, low-voltage complexes, no evidence of ischemia.   Recommendations:   I discussed recently obtained echocardiogram, no significant changes compared to 2015.  She does have grade 1 diastolic dysfunction and I suspect her recent episode of significant leg swelling was related to acute exacerbation of diastolic heart failure.  Her symptoms have since resolved since being on Aldactone, except for occasional chest pressure.  She continues to have chest pressure at night after doing activities  throughout the day.  EKG is without acute abnormalities.  I will schedule her for Lexiscan nuclear stress test to exclude any myocardial ischemia that could be contributing to her symptoms.  Her symptoms may be related to pulmonary or GI etiology if her stress testing is normal.  Her blood pressure is slightly elevated today, but has generally been much better controlled with Aldactone.  We will continue with close monitoring.  Kidney function has remained stable.  I continue to suspect her weight is contributing to her symptoms.  She has continued to gain weight despite working closely with weight loss center.  I have encouraged her to make needed diet changes and start some regular exercise to help with this.  She will anticipate losing 5 pounds prior to her next office visit.  I will see her back in the next few weeks to follow-up on chest pain.  Plan of care was discussed with her daughter, Haynes Dage who is a respiratory therapist, she agrees with the plan.  Miquel Dunn, MSN, APRN, FNP-C [facility]. [place] Office: [phone] Fax: [phone]

## 2018-12-01 ENCOUNTER — Encounter: Payer: Self-pay | Admitting: Cardiology

## 2018-12-08 ENCOUNTER — Ambulatory Visit (INDEPENDENT_AMBULATORY_CARE_PROVIDER_SITE_OTHER): Payer: Medicare Other | Admitting: Family Medicine

## 2018-12-08 ENCOUNTER — Encounter (INDEPENDENT_AMBULATORY_CARE_PROVIDER_SITE_OTHER): Payer: Self-pay | Admitting: Family Medicine

## 2018-12-08 ENCOUNTER — Other Ambulatory Visit: Payer: Self-pay

## 2018-12-08 VITALS — BP 122/60 | HR 60 | Temp 98.2°F | Ht 63.0 in | Wt 245.0 lb

## 2018-12-08 DIAGNOSIS — E1165 Type 2 diabetes mellitus with hyperglycemia: Secondary | ICD-10-CM | POA: Diagnosis not present

## 2018-12-08 DIAGNOSIS — Z6841 Body Mass Index (BMI) 40.0 and over, adult: Secondary | ICD-10-CM

## 2018-12-13 NOTE — Progress Notes (Signed)
Office: [phone]  /  Fax: [phone]   HPI:   Chief Complaint: OBESITY [name] [name] is here to discuss her progress with her obesity treatment plan. She is on the Category 2 plan and is following her eating plan approximately 85 % of the time. She states she is walking 60 minutes 3 times per week. [name] [name] has done well with sticking to the plan over the last few weeks. She likes all of the food on the plan. [name] [name] denies excessive hunger. She is very happy with her weight loss and reports feels better about herself.  She has been working on reducing coffee creamer. Her weight is 245 lb (111.1 kg) today and has had a weight loss of 4 pounds over a period of 2 weeks since her last visit. She has lost 3 lbs since starting treatment with [name].  Diabetes II with hyperglycemia [name] [name] has a diagnosis of diabetes type II and she is not well controlled. Her last A1c was at 7.4 on 10/27/18. [name] [name] states fasting BGs range in the 130's and 2 hour post prandial BGs range in the130's. She is on Metformin, Glimepiride and Tradjenta. She denies any hypoglycemic episodes.  She has been working on intensive lifestyle modifications including diet, exercise, and weight loss to help control her blood glucose levels.  ASSESSMENT AND PLAN:  Type 2 diabetes mellitus with hyperglycemia, without long-term current use of insulin (HCC)  Class 3 severe obesity with serious comorbidity and body mass index (BMI) of 40.0 to 44.9 in adult, unspecified obesity type ([place])  PLAN:  Diabetes II with hyperglycemia [name] [name] has been given extensive diabetes education by myself today including ideal fasting and post-prandial blood glucose readings, individual ideal Hgb A1c goals and hypoglycemia prevention. We discussed the importance of good blood sugar control to decrease the likelihood of diabetic complications such as nephropathy, neuropathy, limb loss, blindness, coronary artery disease, and death. We discussed the  importance of intensive lifestyle modification including diet, exercise and weight loss as the first line treatment for diabetes. [name] [name] agrees to continue all of her diabetes medications and will follow up at the agreed upon time.  Obesity [name] [name] is currently in the action stage of change. As such, her goal is to continue with weight loss efforts She has agreed to follow the Category 2 plan [name] [name] will continue walking 60 minutes, 3 times per week for weight loss and overall health benefits. We discussed the following Behavioral Modification Strategies today: planning for success, increase H2O intake and increasing lean protein intake  [name] [name] has agreed to follow up with our clinic in 2 weeks. She was informed of the importance of frequent follow up visits to maximize her success with intensive lifestyle modifications for her multiple health conditions.  ALLERGIES: Allergies  Allergen Reactions  . Codeine Palpitations    MEDICATIONS: Current Outpatient Medications on File Prior to Visit  Medication Sig Dispense Refill  . ACCU-CHEK AVIVA PLUS test strip U UTD  5  . ACCU-CHEK SOFTCLIX LANCETS lancets U UTD  11  . amLODipine (NORVASC) 10 MG tablet Take 1 tablet (10 mg total) by mouth daily. (Patient taking differently: Take 10 mg by mouth every morning. ) 30 tablet 2  . atorvastatin (LIPITOR) 80 MG tablet Take 1 tablet by mouth every evening.   5  . benazepril (LOTENSIN) 40 MG tablet Take 40 mg by mouth every evening.     . bisoprolol (ZEBETA) 10 MG tablet TAKE 1 TABLET BY MOUTH EVERY DAY 90 tablet 1  .  cetirizine (ZYRTEC) 10 MG tablet Take 10 mg by mouth at bedtime.     . chlorthalidone (HYGROTON) 25 MG tablet Take 25 mg by mouth every morning. 2 table once daily  3  . escitalopram (LEXAPRO) 20 MG tablet Take 20 mg by mouth every evening.     . fluorouracil (EFUDEX) 5 % cream APPLY CREAM BID FOR 4 WEEKS TO THE ARMS. APPLY BID FOR 2 WEEKS TO THE FACE    . glimepiride (AMARYL) 4 MG  tablet Take 2 mg by mouth daily with breakfast. 1/2 tablet daily    . linagliptin (TRADJENTA) 5 MG TABS tablet Take 1 tablet (5 mg total) by mouth every morning. 30 tablet 0  . meloxicam (MOBIC) 15 MG tablet Take 15 mg by mouth every morning.     . metFORMIN (GLUCOPHAGE) 1000 MG tablet Take 1 tablet (1,000 mg total) by mouth 2 (two) times daily with a meal. 60 tablet 0  . Multiple Vitamin (MULTIVITAMIN) tablet Take 1 tablet by mouth daily.    [name] [name] Kitchen omeprazole (PRILOSEC) 20 MG capsule Take 20 mg by mouth every morning.     [name] [name] Kitchen spironolactone (ALDACTONE) 25 MG tablet TAKE 1 TABLET BY MOUTH DAILY 90 tablet 1  . Vitamin D, Ergocalciferol, (DRISDOL) 1.25 MG (50000 UT) CAPS capsule Take 1 capsule (50,000 Units total) by mouth once a week. 4 capsule 0   No current facility-administered medications on file prior to visit.     PAST MEDICAL HISTORY: Past Medical History:  Diagnosis Date  . Carpal tunnel syndrome of right wrist   . Coronary artery disease    per cath 11-29-2013  mild diffuse CAD and calcification in proximal LAD  . Depression   . Diverticulosis   . GERD (gastroesophageal reflux disease)   . History of endometrial cancer 05/2003   FIGO, Grade 1  s/p  TAG w/ BSO  . HTN (hypertension)   . Hyperlipidemia   . OA (osteoarthritis)   . OSA on CPAP    followed by dr Elsworth Soho-- per study 03-26-2012  mod. to severe OSA , AHI 37/hr  . Type II diabetes mellitus ([place])    followed by pcp  . Urinary incontinence in female   . Vitamin D deficiency     PAST SURGICAL HISTORY: Past Surgical History:  Procedure Laterality Date  . CARDIAC CATHETERIZATION  11/29/2013   dr Einar Gip   mild diffuse noncritial CAD and mild calcification involving the proximal LAD,  LVEF 55-60%  . CARPAL TUNNEL RELEASE Right 07/17/2017   Procedure: CARPAL TUNNEL RELEASE;  Surgeon: Dorna Leitz, MD;  Location: [place];  Service: Orthopedics;  Laterality: Right;  . KNEE ARTHROSCOPY Left 10/ 2004;  05-17-2008    dr graves  . KNEE ARTHROSCOPY Right 11/2012  . LAPAROSCOPIC CHOLECYSTECTOMY  11-19-2006  dr Zella Richer  [facility]  . LEFT HEART CATHETERIZATION WITH CORONARY ANGIOGRAM N/A 11/29/2013   Procedure: LEFT HEART CATHETERIZATION WITH CORONARY ANGIOGRAM;  Surgeon: Laverda Page, MD;  Location: [facility] C[facility] LAB;  Service: Cardiovascular;  Laterality: N/A;  . TOTAL ABDOMINAL HYSTERECTOMY W/ BILATERAL SALPINGOOPHORECTOMY  06-20-2003    dr Mohammed Kindle  [facility]   endometrial cancer  . TOTAL HIP ARTHROPLASTY Right 09/02/2013   Procedure: RIGHT TOTAL HIP ARTHROPLASTY ANTERIOR APPROACH;  Surgeon: Alta Corning, MD;  Location: [place];  Service: Orthopedics;  Laterality: Right;  . TOTAL KNEE ARTHROPLASTY Left 08/02/2012   Procedure: TOTAL KNEE ARTHROPLASTY;  Surgeon: Alta Corning, MD;  Location: [place];  Service: Orthopedics;  Laterality: Left;  left total knee arthroplasty  . TUBAL LIGATION  yrs ago    SOCIAL HISTORY: Social History   Tobacco Use  . Smoking status: Never Smoker  . Smokeless tobacco: Never Used  Substance Use Topics  . Alcohol use: No  . Drug use: No    FAMILY HISTORY: Family History  Problem Relation Age of Onset  . Heart disease Father 22       CABG  . Lung cancer Father 53       lung  . Hypertension Father   . Diabetes Father   . High Cholesterol Father   . Diabetes type II Father   . Heart disease Sister 35       CABG  . Diabetes Sister 80  . Heart disease Brother 66       MI with stents  . Hypertension Mother   . Diabetes Mother   . High Cholesterol Mother   . Sudden death Mother   . Thyroid disease Mother   . Obesity Mother   . Hypertension Sister        x2   . Diabetes Sister   . Hypertension Brother   . Diabetes Brother   . Cancer Sister        high grade Neuroendocrine carcinoma right axilla  . Pancreatic cancer Other        x 3 - 2 MAunts and 1 P Aunts.   . Lupus Other        Pat Aunt    ROS: Review of Systems  Constitutional: Positive for weight loss.   Endo/Heme/Allergies:       Negative for hypoglycemia    PHYSICAL EXAM: Blood pressure 122/60, pulse 60, temperature 98.2 F (36.8 C), temperature source Oral, height 5\' 3"  (1.6 m), weight 245 lb (111.1 kg), last menstrual period 11/25/2002, SpO2 96 %. Body mass index is 43.4 kg/m. Physical Exam Vitals signs reviewed.  Constitutional:      Appearance: Normal appearance. She is well-developed. She is obese.  Cardiovascular:     Rate and Rhythm: Normal rate.  Pulmonary:     Effort: Pulmonary effort is normal.  Musculoskeletal: Normal range of motion.  Skin:    General: Skin is warm and dry.  Neurological:     Mental Status: She is alert and oriented to person, place, and time.  Psychiatric:        Mood and Affect: Mood normal.        Behavior: Behavior normal.     RECENT LABS AND TESTS: BMET    Component Value Date/Time   NA 137 11/17/2018 1052   K 4.8 11/17/2018 1052   CL 100 11/17/2018 1052   CO2 21 11/17/2018 1052   GLUCOSE 147 (H) 11/17/2018 1052   GLUCOSE 134 (H) 07/09/2018 0457   BUN 34 (H) 11/17/2018 1052   CREATININE 0.92 11/17/2018 1052   CREATININE 1.24 (H) 07/16/2015 1339   CALCIUM 9.9 11/17/2018 1052   GFRNONAA 65 11/17/2018 1052   GFRAA 75 11/17/2018 1052   Lab Results  Component Value Date   HGBA1C 7.4 (H) 10/27/2018   HGBA1C 6.7 (H) 02/01/2018   HGBA1C 6.7 (H) 10/06/2017   HGBA1C 6.9 (H) 04/29/2017   HGBA1C 7.0 (H) 07/16/2015   Lab Results  Component Value Date   INSULIN 21.4 02/01/2018   INSULIN 24.9 10/06/2017   INSULIN 36.0 (H) 04/29/2017   CBC    Component Value Date/Time   WBC 15.7 (H) 07/09/2018 0457   RBC 4.64  07/09/2018 0457   HGB 13.3 07/09/2018 0457   HGB 13.1 04/29/2017 0935   HGB 11.2 (L) 07/11/2014 1340   HCT 41.4 07/09/2018 0457   HCT 39.4 04/29/2017 0935   PLT 334 07/09/2018 0457   MCV 89.2 07/09/2018 0457   MCV 88 04/29/2017 0935   MCH 28.7 07/09/2018 0457   MCHC 32.1 07/09/2018 0457   RDW 13.0 07/09/2018 0457    RDW 14.2 04/29/2017 0935   LYMPHSABS 1.1 07/05/2018 0535   LYMPHSABS 2.1 04/29/2017 0935   MONOABS 0.5 07/05/2018 0535   EOSABS 0.0 07/05/2018 0535   EOSABS 0.2 04/29/2017 0935   BASOSABS 0.1 07/05/2018 0535   BASOSABS 0.0 04/29/2017 0935   Iron/TIBC/Ferritin/ %Sat    Component Value Date/Time   FERRITIN 240 07/09/2018 0458   Lipid Panel     Component Value Date/Time   CHOL 214 (H) 10/27/2018 0945   TRIG 392 (H) 10/27/2018 0945   HDL 35 (L) 10/27/2018 0945   CHOLHDL 5.9 (H) 07/16/2015 1339   VLDL NOT CALC 07/16/2015 1339   LDLCALC 111 (H) 10/27/2018 0945   Hepatic Function Panel     Component Value Date/Time   PROT 6.7 10/27/2018 0945   ALBUMIN 4.3 10/27/2018 0945   AST 18 10/27/2018 0945   ALT 27 10/27/2018 0945   ALKPHOS 53 10/27/2018 0945   BILITOT 0.3 10/27/2018 0945      Component Value Date/Time   TSH 2.300 10/27/2018 0945   TSH 3.010 04/29/2017 0935   TSH 3.96 07/16/2015 1339     Ref. Range 10/27/2018 09:45  Vitamin D, 25-Hydroxy Latest Ref Range: 30.0 - 100.0 ng/mL 45.6    OBESITY BEHAVIORAL INTERVENTION VISIT  Today's visit was # 26  Starting weight: 248 lbs Starting date: 04/29/2017 Today's weight : 245 lbs Today's date: 12/08/2018 Total lbs lost to date: 3    12/08/2018  Height 5\' 3"  (1.6 m)  Weight 245 lb (111.1 kg)  BMI (Calculated) 43.41  BLOOD PRESSURE - SYSTOLIC [id number]  BLOOD PRESSURE - DIASTOLIC 60   Body Fat % [id number] %  Total Body Water (lbs) 86.2 lbs    ASK: We discussed the diagnosis of obesity with [name] today and Kaislee agreed to give [name] permission to discuss obesity behavioral modification therapy today.  ASSESS: Novelle has the diagnosis of obesity and her BMI today is 43.41 Gela is in the action stage of change   ADVISE: Debie was educated on the multiple health risks of obesity as well as the benefit of weight loss to improve her health. She was advised of the need for long term treatment and the importance  of lifestyle modifications to improve her current health and to decrease her risk of future health problems.  AGREE: Multiple dietary modification options and treatment options were discussed and  Genysis agreed to follow the recommendations documented in the above note.  ARRANGE: Camryn was educated on the importance of frequent visits to treat obesity as outlined per CMS and USPSTF guidelines and agreed to schedule her next follow up appointment today.  I, Doreene Nest, am acting as transcriptionist for [employer], FNP-C  I have reviewed the above documentation for accuracy and completeness, and I agree with the above.  - Eulene Pekar, FNP-C.

## 2018-12-14 ENCOUNTER — Encounter (INDEPENDENT_AMBULATORY_CARE_PROVIDER_SITE_OTHER): Payer: Self-pay | Admitting: Family Medicine

## 2018-12-23 DIAGNOSIS — Z6841 Body Mass Index (BMI) 40.0 and over, adult: Secondary | ICD-10-CM | POA: Diagnosis not present

## 2018-12-23 DIAGNOSIS — Z8619 Personal history of other infectious and parasitic diseases: Secondary | ICD-10-CM | POA: Diagnosis not present

## 2018-12-23 DIAGNOSIS — M65341 Trigger finger, right ring finger: Secondary | ICD-10-CM | POA: Diagnosis not present

## 2018-12-23 DIAGNOSIS — M65331 Trigger finger, right middle finger: Secondary | ICD-10-CM | POA: Diagnosis not present

## 2018-12-23 DIAGNOSIS — E119 Type 2 diabetes mellitus without complications: Secondary | ICD-10-CM | POA: Diagnosis not present

## 2018-12-23 DIAGNOSIS — K760 Fatty (change of) liver, not elsewhere classified: Secondary | ICD-10-CM | POA: Diagnosis not present

## 2018-12-23 DIAGNOSIS — E782 Mixed hyperlipidemia: Secondary | ICD-10-CM | POA: Diagnosis not present

## 2018-12-23 DIAGNOSIS — N183 Chronic kidney disease, stage 3 unspecified: Secondary | ICD-10-CM | POA: Diagnosis not present

## 2018-12-23 DIAGNOSIS — I10 Essential (primary) hypertension: Secondary | ICD-10-CM | POA: Diagnosis not present

## 2018-12-27 ENCOUNTER — Ambulatory Visit (INDEPENDENT_AMBULATORY_CARE_PROVIDER_SITE_OTHER): Payer: Medicare Other | Admitting: Family Medicine

## 2018-12-27 ENCOUNTER — Other Ambulatory Visit: Payer: Self-pay

## 2018-12-27 ENCOUNTER — Ambulatory Visit (INDEPENDENT_AMBULATORY_CARE_PROVIDER_SITE_OTHER): Payer: Medicare Other

## 2018-12-27 DIAGNOSIS — R0789 Other chest pain: Secondary | ICD-10-CM | POA: Diagnosis not present

## 2018-12-30 ENCOUNTER — Ambulatory Visit (INDEPENDENT_AMBULATORY_CARE_PROVIDER_SITE_OTHER): Payer: Medicare Other | Admitting: Family Medicine

## 2018-12-30 ENCOUNTER — Other Ambulatory Visit: Payer: Self-pay

## 2018-12-30 ENCOUNTER — Encounter (INDEPENDENT_AMBULATORY_CARE_PROVIDER_SITE_OTHER): Payer: Self-pay | Admitting: Family Medicine

## 2018-12-30 VITALS — BP 121/70 | HR 56 | Temp 98.2°F | Ht 63.0 in | Wt 244.0 lb

## 2018-12-30 DIAGNOSIS — E1165 Type 2 diabetes mellitus with hyperglycemia: Secondary | ICD-10-CM

## 2018-12-30 DIAGNOSIS — E559 Vitamin D deficiency, unspecified: Secondary | ICD-10-CM | POA: Diagnosis not present

## 2018-12-30 DIAGNOSIS — Z6841 Body Mass Index (BMI) 40.0 and over, adult: Secondary | ICD-10-CM

## 2018-12-30 MED ORDER — VITAMIN D (ERGOCALCIFEROL) 1.25 MG (50000 UNIT) PO CAPS: 50000.0000 [IU] | ORAL_CAPSULE | ORAL | 0 refills | Status: DC

## 2018-12-30 NOTE — Progress Notes (Signed)
Office: [phone]  /  Fax: [phone]   HPI:   Chief Complaint: OBESITY [name] [name] is here to discuss her progress with her obesity treatment plan. She is on the Category 2 plan and is following her eating plan approximately 80 % of the time. She states she is walking for 15-20 minutes 5 times per week. [name] [name] is getting protein in at all 3 meals. She is not skipping meals, but she tends to go over her snack calories. She often eats an extra apple at bedtime. She is substituting milk and yogurt for meat at times.  Her weight is 244 lb (110.7 kg) today and has had a weight loss of 1 pound over a period of 3 weeks since her last visit. She has lost 4 lbs since starting treatment with [name].  Diabetes II with Hyperglycemia [name] [name] has a diagnosis of diabetes type II. [name] [name]'s diabetes is not well controlled. She is on metformin, Tradjenta, and glimepiride 2 mg BID. She states her fasting BGs range between 90 and 112, and 2 hour post prandial range between 140 and 158. She denies hypoglycemia. Last A1c was 7.4 on 10/27/2018. She has been working on intensive lifestyle modifications including diet, exercise, and weight loss to help control her blood glucose levels.  Vitamin D Deficiency [name] [name] has a diagnosis of vitamin D deficiency. She is currently taking prescription Vit D. Last Vit D level was not at goal, Vit D level was 45.6 on 10/27/2018. She denies nausea, vomiting or muscle weakness.  ASSESSMENT AND PLAN:  Type 2 diabetes mellitus with hyperglycemia, without long-term current use of insulin (HCC)  Vitamin D deficiency - Plan: Vitamin D, Ergocalciferol, (DRISDOL) 1.25 MG (50000 UT) CAPS capsule  Class 3 severe obesity with serious comorbidity and body mass index (BMI) of 40.0 to 44.9 in adult, unspecified obesity type ([name] [name])  PLAN:  Diabetes II with Hyperglycemia  [name] [name] agrees to continue all of her diabetes medications at current doses, and she agrees to follow up with our  clinic in 3 to 4 weeks.  Vitamin D Deficiency [name] [name] was informed that low vitamin D levels contributes to fatigue and are associated with obesity, breast, and colon cancer. [name] [name] agrees to continue taking prescription Vit D 50,000 IU every week #4 and we will refill for 1 month She will follow up for routine testing of vitamin D, at least 2-3 times per year. She was informed of the risk of over-replacement of vitamin D and agrees to not increase her dose unless she discusses this with [name] first. [name] [name] agrees to follow up with our clinic in 3 to 4 weeks.  Obesity [name] [name] is currently in the action stage of change. As such, her goal is to continue with weight loss efforts She has agreed to follow the Category 2 plan + 100 calories [name] [name] may add 100 calories per day. Also we discussed she may add non-starchy vegetables daily for a snack. Rigley has been instructed to work up to a goal of 150 minutes of combined cardio and strengthening exercise per week or continue current exercise as above for weight loss and overall health benefits. We discussed the following Behavioral Modification Strategies today: better snacking choices and planning for success   [name] [name] has agreed to follow up with our clinic in 3 to 4 weeks. She was informed of the importance of frequent follow up visits to maximize her success with intensive lifestyle modifications for her multiple health conditions.  ALLERGIES: Allergies  Allergen Reactions  . Codeine Palpitations  MEDICATIONS: Current Outpatient Medications on File Prior to Visit  Medication Sig Dispense Refill  . ACCU-CHEK AVIVA PLUS test strip U UTD  5  . ACCU-CHEK SOFTCLIX LANCETS lancets U UTD  11  . amLODipine (NORVASC) 10 MG tablet Take 1 tablet (10 mg total) by mouth daily. (Patient taking differently: Take 10 mg by mouth every morning. ) 30 tablet 2  . atorvastatin (LIPITOR) 80 MG tablet Take 1 tablet by mouth every evening.   5  .  benazepril (LOTENSIN) 40 MG tablet Take 40 mg by mouth every evening.     . bisoprolol (ZEBETA) 10 MG tablet TAKE 1 TABLET BY MOUTH EVERY DAY 90 tablet 1  . cetirizine (ZYRTEC) 10 MG tablet Take 10 mg by mouth at bedtime.     . chlorthalidone (HYGROTON) 25 MG tablet Take 25 mg by mouth every morning. 2 table once daily  3  . escitalopram (LEXAPRO) 20 MG tablet Take 20 mg by mouth every evening.     . fluorouracil (EFUDEX) 5 % cream APPLY CREAM BID FOR 4 WEEKS TO THE ARMS. APPLY BID FOR 2 WEEKS TO THE FACE    . glimepiride (AMARYL) 4 MG tablet Take 2 mg by mouth daily with breakfast. 1/2 tablet daily    . linagliptin (TRADJENTA) 5 MG TABS tablet Take 1 tablet (5 mg total) by mouth every morning. 30 tablet 0  . meloxicam (MOBIC) 15 MG tablet Take 15 mg by mouth every morning.     . metFORMIN (GLUCOPHAGE) 1000 MG tablet Take 1 tablet (1,000 mg total) by mouth 2 (two) times daily with a meal. 60 tablet 0  . Multiple Vitamin (MULTIVITAMIN) tablet Take 1 tablet by mouth daily.    Marland Kitchen omeprazole (PRILOSEC) 20 MG capsule Take 20 mg by mouth every morning.     Marland Kitchen spironolactone (ALDACTONE) 25 MG tablet TAKE 1 TABLET BY MOUTH DAILY 90 tablet 1   No current facility-administered medications on file prior to visit.     PAST MEDICAL HISTORY: Past Medical History:  Diagnosis Date  . Carpal tunnel syndrome of right wrist   . Coronary artery disease    per cath 11-29-2013  mild diffuse CAD and calcification in proximal LAD  . Depression   . Diverticulosis   . GERD (gastroesophageal reflux disease)   . History of endometrial cancer 05/2003   FIGO, Grade 1  s/p  TAG w/ BSO  . HTN (hypertension)   . Hyperlipidemia   . OA (osteoarthritis)   . OSA on CPAP    followed by dr Elsworth Soho-- per study 03-26-2012  mod. to severe OSA , AHI 37/hr  . Type II diabetes mellitus ([place])    followed by pcp  . Urinary incontinence in female   . Vitamin D deficiency     PAST SURGICAL HISTORY: Past Surgical History:   Procedure Laterality Date  . CARDIAC CATHETERIZATION  11/29/2013   dr Einar Gip   mild diffuse noncritial CAD and mild calcification involving the proximal LAD,  LVEF 55-60%  . CARPAL TUNNEL RELEASE Right 07/17/2017   Procedure: CARPAL TUNNEL RELEASE;  Surgeon: Dorna Leitz, MD;  Location: [place];  Service: Orthopedics;  Laterality: Right;  . KNEE ARTHROSCOPY Left 10/ 2004;  05-17-2008   dr graves  . KNEE ARTHROSCOPY Right 11/2012  . LAPAROSCOPIC CHOLECYSTECTOMY  11-19-2006  dr Zella Richer  [facility]  . LEFT HEART CATHETERIZATION WITH CORONARY ANGIOGRAM N/A 11/29/2013   Procedure: LEFT HEART CATHETERIZATION WITH CORONARY ANGIOGRAM;  Surgeon: Laverda Page,  MD;  Location: [place] CATH LAB;  Service: Cardiovascular;  Laterality: N/A;  . TOTAL ABDOMINAL HYSTERECTOMY W/ BILATERAL SALPINGOOPHORECTOMY  06-20-2003    dr Mohammed Kindle  [facility]   endometrial cancer  . TOTAL HIP ARTHROPLASTY Right 09/02/2013   Procedure: RIGHT TOTAL HIP ARTHROPLASTY ANTERIOR APPROACH;  Surgeon: Alta Corning, MD;  Location: [place];  Service: Orthopedics;  Laterality: Right;  . TOTAL KNEE ARTHROPLASTY Left 08/02/2012   Procedure: TOTAL KNEE ARTHROPLASTY;  Surgeon: Alta Corning, MD;  Location: [place];  Service: Orthopedics;  Laterality: Left;  left total knee arthroplasty  . TUBAL LIGATION  yrs ago    SOCIAL HISTORY: Social History   Tobacco Use  . Smoking status: Never Smoker  . Smokeless tobacco: Never Used  Substance Use Topics  . Alcohol use: No  . Drug use: No    FAMILY HISTORY: Family History  Problem Relation Age of Onset  . Heart disease Father 56       CABG  . Lung cancer Father 47       lung  . Hypertension Father   . Diabetes Father   . High Cholesterol Father   . Diabetes type II Father   . Heart disease Sister 65       CABG  . Diabetes Sister 2  . Heart disease Brother 2       MI with stents  . Hypertension Mother   . Diabetes Mother   . High Cholesterol Mother   . Sudden death Mother    . Thyroid disease Mother   . Obesity Mother   . Hypertension Sister        x2   . Diabetes Sister   . Hypertension Brother   . Diabetes Brother   . Cancer Sister        high grade Neuroendocrine carcinoma right axilla  . Pancreatic cancer Other        x 3 - 2 MAunts and 1 P Aunts.   . Lupus Other        Pat Aunt    ROS: Review of Systems  Constitutional: Positive for weight loss.  Gastrointestinal: Negative for nausea and vomiting.  Musculoskeletal:       Negative muscle weakness  Endo/Heme/Allergies:       Negative hypoglycemia    PHYSICAL EXAM: Blood pressure 121/70, pulse (!) 56, temperature 98.2 F (36.8 C), temperature source Oral, height 5\' 3"  (1.6 m), weight 244 lb (110.7 kg), last menstrual period 11/25/2002, SpO2 100 %. Body mass index is 43.22 kg/m. Physical Exam Vitals signs reviewed.  Constitutional:      Appearance: Normal appearance. She is obese.  Cardiovascular:     Rate and Rhythm: Normal rate.     Pulses: Normal pulses.  Pulmonary:     Effort: Pulmonary effort is normal.     Breath sounds: Normal breath sounds.  Musculoskeletal: Normal range of motion.  Skin:    General: Skin is warm and dry.  Neurological:     Mental Status: She is alert and oriented to person, place, and time.  Psychiatric:        Mood and Affect: Mood normal.        Behavior: Behavior normal.     RECENT LABS AND TESTS: BMET    Component Value Date/Time   NA 137 11/17/2018 1052   K 4.8 11/17/2018 1052   CL 100 11/17/2018 1052   CO2 21 11/17/2018 1052   GLUCOSE 147 (H) 11/17/2018 1052   GLUCOSE  134 (H) 07/09/2018 0457   BUN 34 (H) 11/17/2018 1052   CREATININE 0.92 11/17/2018 1052   CREATININE 1.24 (H) 07/16/2015 1339   CALCIUM 9.9 11/17/2018 1052   GFRNONAA 65 11/17/2018 1052   GFRAA 75 11/17/2018 1052   Lab Results  Component Value Date   HGBA1C 7.4 (H) 10/27/2018   HGBA1C 6.7 (H) 02/01/2018   HGBA1C 6.7 (H) 10/06/2017   HGBA1C 6.9 (H) 04/29/2017   HGBA1C  7.0 (H) 07/16/2015   Lab Results  Component Value Date   INSULIN 21.4 02/01/2018   INSULIN 24.9 10/06/2017   INSULIN 36.0 (H) 04/29/2017   CBC    Component Value Date/Time   WBC 15.7 (H) 07/09/2018 0457   RBC 4.64 07/09/2018 0457   HGB 13.3 07/09/2018 0457   HGB 13.1 04/29/2017 0935   HGB 11.2 (L) 07/11/2014 1340   HCT 41.4 07/09/2018 0457   HCT 39.4 04/29/2017 0935   PLT 334 07/09/2018 0457   MCV 89.2 07/09/2018 0457   MCV 88 04/29/2017 0935   MCH 28.7 07/09/2018 0457   MCHC 32.1 07/09/2018 0457   RDW 13.0 07/09/2018 0457   RDW 14.2 04/29/2017 0935   LYMPHSABS 1.1 07/05/2018 0535   LYMPHSABS 2.1 04/29/2017 0935   MONOABS 0.5 07/05/2018 0535   EOSABS 0.0 07/05/2018 0535   EOSABS 0.2 04/29/2017 0935   BASOSABS 0.1 07/05/2018 0535   BASOSABS 0.0 04/29/2017 0935   Iron/TIBC/Ferritin/ %Sat    Component Value Date/Time   FERRITIN 240 07/09/2018 0458   Lipid Panel     Component Value Date/Time   CHOL 214 (H) 10/27/2018 0945   TRIG 392 (H) 10/27/2018 0945   HDL 35 (L) 10/27/2018 0945   CHOLHDL 5.9 (H) 07/16/2015 1339   VLDL NOT CALC 07/16/2015 1339   LDLCALC 111 (H) 10/27/2018 0945   Hepatic Function Panel     Component Value Date/Time   PROT 6.7 10/27/2018 0945   ALBUMIN 4.3 10/27/2018 0945   AST 18 10/27/2018 0945   ALT 27 10/27/2018 0945   ALKPHOS 53 10/27/2018 0945   BILITOT 0.3 10/27/2018 0945      Component Value Date/Time   TSH 2.300 10/27/2018 0945   TSH 3.010 04/29/2017 0935   TSH 3.96 07/16/2015 1339      OBESITY BEHAVIORAL INTERVENTION VISIT  Today's visit was # 10   Starting weight: 248 lbs Starting date: 04/29/17 Today's weight : 244 lbs Today's date: 12/30/2018 Total lbs lost to date: 4 At least 15 minutes were spent on discussing the following behavioral intervention visit.   ASK: We discussed the diagnosis of obesity with [name] [name] today and [name] [name] agreed to give [name] permission to discuss obesity behavioral modification  therapy today.  ASSESS: [name] [name] has the diagnosis of obesity and her BMI today is 43.23 [name] [name] is in the action stage of change   ADVISE: [name] [name] was educated on the multiple health risks of obesity as well as the benefit of weight loss to improve her health. She was advised of the need for long term treatment and the importance of lifestyle modifications to improve her current health and to decrease her risk of future health problems.  AGREE: Multiple dietary modification options and treatment options were discussed and  [name] [name] agreed to follow the recommendations documented in the above note.  ARRANGE: [name] [name] was educated on the importance of frequent visits to treat obesity as outlined per CMS and USPSTF guidelines and agreed to schedule her next follow up appointment today.  Clide Dales  Hassell Done, am acting as Location manager for [employer], FNP-C.  I have reviewed the above documentation for accuracy and completeness, and I agree with the above.  - Thiago Ragsdale, FNP-C.

## 2019-01-03 ENCOUNTER — Encounter: Payer: Self-pay | Admitting: Cardiology

## 2019-01-03 ENCOUNTER — Ambulatory Visit (INDEPENDENT_AMBULATORY_CARE_PROVIDER_SITE_OTHER): Payer: Medicare Other | Admitting: Cardiology

## 2019-01-03 ENCOUNTER — Encounter (INDEPENDENT_AMBULATORY_CARE_PROVIDER_SITE_OTHER): Payer: Self-pay | Admitting: Family Medicine

## 2019-01-03 ENCOUNTER — Other Ambulatory Visit: Payer: Self-pay

## 2019-01-03 VITALS — BP 137/68 | HR 53 | Temp 98.0°F | Ht 63.0 in | Wt 247.1 lb

## 2019-01-03 DIAGNOSIS — I5032 Chronic diastolic (congestive) heart failure: Secondary | ICD-10-CM | POA: Diagnosis not present

## 2019-01-03 DIAGNOSIS — I10 Essential (primary) hypertension: Secondary | ICD-10-CM

## 2019-01-03 DIAGNOSIS — I25118 Atherosclerotic heart disease of native coronary artery with other forms of angina pectoris: Secondary | ICD-10-CM

## 2019-01-03 DIAGNOSIS — Z6841 Body Mass Index (BMI) 40.0 and over, adult: Secondary | ICD-10-CM

## 2019-01-03 DIAGNOSIS — R9439 Abnormal result of other cardiovascular function study: Secondary | ICD-10-CM | POA: Diagnosis not present

## 2019-01-03 MED ORDER — SPIRONOLACTONE 25 MG PO TABS: 25.0000 mg | ORAL_TABLET | Freq: Every day | ORAL | 1 refills | Status: DC

## 2019-01-03 NOTE — Progress Notes (Signed)
Primary Physician:  Vicenta Aly, FNP   Patient ID: [name] [name], female    DOB: Sep 04, 1950, 68 y.o.   MRN: [id number]  Subjective:    Chief Complaint  Patient presents with  . Chest Pain  . Follow-up  . Results    stress test    HPI: [name] [name]  is a 68 y.o. female  with history of mild coronary artery disease by coronary angiography in 2015 revealing mild LAD diffuse calcification and coronary atherosclerosis, diabetes mellitus which is recently uncontrolled, morbid obesity with obstructive sleep apnea recently established and is presently on CPAP since 2018, mixed hyperlipidemia, chronic palpitations with history of PACs and PVCs that have remained stable.  She was recently seen for persistent chest discomfort described as chest heaviness and leg edema.  Symptoms were felt to be related to weight gain and diastolic dysfunction.  She underwent echocardiogram on 11/16/18 that showed grade 1 diastolic dysfunction. She was started on Aldactone and has had improvement in leg edema. Due to continued symptoms, she underwent lexiscan nuclear stress testing and now presents to discuss results.   Patient continues to report chest pressure at the end of the day after being active doing household chores that gradually resolves.  She mostly notices this while cooking dinner.  She does not have any significant shortness of breath.  She continues to work on weight loss and is being followed by weight loss center.   Past Medical History:  Diagnosis Date  . Carpal tunnel syndrome of right wrist   . Coronary artery disease    per cath 11-29-2013  mild diffuse CAD and calcification in proximal LAD  . Depression   . Diverticulosis   . GERD (gastroesophageal reflux disease)   . History of endometrial cancer 05/2003   FIGO, Grade 1  s/p  TAG w/ BSO  . HTN (hypertension)   . Hyperlipidemia   . OA (osteoarthritis)   . OSA on CPAP    followed by [name]-- per study 03-26-2012  mod. to  severe OSA , AHI 37/hr  . Type II diabetes mellitus ([place])    followed by pcp  . Urinary incontinence in female   . Vitamin D deficiency     Past Surgical History:  Procedure Laterality Date  . CARDIAC CATHETERIZATION  11/29/2013   [name] Einar Gip   mild diffuse noncritial CAD and mild calcification involving the proximal LAD,  LVEF 55-60%  . CARPAL TUNNEL RELEASE Right 07/17/2017   Procedure: CARPAL TUNNEL RELEASE;  Surgeon: [name] Leitz, MD;  Location: [facility];  Service: Orthopedics;  Laterality: Right;  . KNEE ARTHROSCOPY Left 10/ 2004;  05-17-2008   [name] [name]  . KNEE ARTHROSCOPY Right 11/2012  . LAPAROSCOPIC CHOLECYSTECTOMY  11-19-2006  [name] [name]  [facility]  . LEFT HEART CATHETERIZATION WITH CORONARY ANGIOGRAM N/A 11/29/2013   Procedure: LEFT HEART CATHETERIZATION WITH CORONARY ANGIOGRAM;  Surgeon: Laverda Page, MD;  Location: [facility] C[facility] LAB;  Service: Cardiovascular;  Laterality: N/A;  . TOTAL ABDOMINAL HYSTERECTOMY W/ BILATERAL SALPINGOOPHORECTOMY  06-20-2003    [name] [name]  [facility]   endometrial cancer  . TOTAL HIP ARTHROPLASTY Right 09/02/2013   Procedure: RIGHT TOTAL HIP ARTHROPLASTY ANTERIOR APPROACH;  Surgeon: [name] Corning, MD;  Location: [place];  Service: Orthopedics;  Laterality: Right;  . TOTAL KNEE ARTHROPLASTY Left 08/02/2012   Procedure: TOTAL KNEE ARTHROPLASTY;  Surgeon: [name] Corning, MD;  Location: [place];  Service: Orthopedics;  Laterality: Left;  left total knee  arthroplasty  . TUBAL LIGATION  yrs ago    Social History   Socioeconomic History  . Marital status: Married    Spouse name: Dominik Owings  . Number of children: 3  . Years of education: Not on file  . Highest education level: Not on file  Occupational History  . Occupation: housewife  Social Needs  . Financial resource strain: Not on file  . Food insecurity    Worry: Not on file    Inability: Not on file  . Transportation needs    Medical: Not on file    Non-medical: Not on file  Tobacco Use   . Smoking status: Never Smoker  . Smokeless tobacco: Never Used  Substance and Sexual Activity  . Alcohol use: No  . Drug use: No  . Sexual activity: Not on file  Lifestyle  . Physical activity    Days per week: Not on file    Minutes per session: Not on file  . Stress: Not on file  Relationships  . Social Herbalist on phone: Not on file    Gets together: Not on file    Attends religious service: Not on file    Active member of club or organization: Not on file    Attends meetings of clubs or organizations: Not on file    Relationship status: Not on file  . Intimate partner violence    Fear of current or ex partner: Not on file    Emotionally abused: Not on file    Physically abused: Not on file    Forced sexual activity: Not on file  Other Topics Concern  . Not on file  Social History Narrative  . Not on file    Review of Systems  Constitution: Positive for weight gain. Negative for decreased appetite, malaise/fatigue and weight loss.  Eyes: Negative for visual disturbance.  Cardiovascular: Positive for chest pain and leg swelling (improved). Negative for claudication, dyspnea on exertion, orthopnea, palpitations and syncope.  Respiratory: Positive for shortness of breath (improved). Negative for hemoptysis and wheezing.   Endocrine: Negative for cold intolerance and heat intolerance.  Hematologic/Lymphatic: Does not bruise/bleed easily.  Skin: Negative for nail changes.  Musculoskeletal: Negative for muscle weakness and myalgias.  Gastrointestinal: Negative for abdominal pain, change in bowel habit, nausea and vomiting.  Neurological: Negative for difficulty with concentration, dizziness, focal weakness and headaches.  Psychiatric/Behavioral: Negative for altered mental status and suicidal ideas.  All other systems reviewed and are negative.     Objective:  Blood pressure 137/68, pulse (!) 53, temperature 98 F (36.7 C), height 5\' 3"  (1.6 m), weight 247  lb 1.6 oz (112.1 kg), last menstrual period 11/25/2002, SpO2 98 %. Body mass index is 43.77 kg/m.    Physical Exam  Constitutional: She is oriented to person, place, and time. Vital signs are normal. She appears well-developed and well-nourished.  HENT:  Head: Normocephalic and atraumatic.  Neck: Normal range of motion.  Cardiovascular: Normal rate, regular rhythm and intact distal pulses.  Murmur heard.  Low-pitched early systolic murmur is present with a grade of 1/6 at the upper right sternal border. Pulses:      Femoral pulses are 1+ on the right side and 1+ on the left side.      Popliteal pulses are 1+ on the right side and 1+ on the left side.       Dorsalis pedis pulses are 2+ on the right side and 2+ on the left side.  Posterior tibial pulses are 2+ on the right side and 2+ on the left side.  1+ pitting edema bilateral  Pulses difficult to feel due to bodily habitus  Pulmonary/Chest: Effort normal and breath sounds normal. No accessory muscle usage. No respiratory distress.  Abdominal: Soft. Bowel sounds are normal.  Musculoskeletal: Normal range of motion.  Neurological: She is alert and oriented to person, place, and time.  Skin: Skin is warm and dry.  Vitals reviewed.  Radiology: No results found.  Laboratory examination:    CMP Latest Ref Rng & Units 11/17/2018 10/27/2018 07/09/2018  Glucose 65 - 99 mg/dL 147(H) 109(H) 134(H)  BUN 8 - 27 mg/dL 34(H) 27 33(H)  Creatinine 0.57 - 1.00 mg/dL 0.92 0.88 0.80  Sodium 134 - 144 mmol/L 137 140 138  Potassium 3.5 - 5.2 mmol/L 4.8 4.2 3.7  Chloride 96 - 106 mmol/L 100 99 98  CO2 20 - 29 mmol/L 21 24 29   Calcium 8.7 - 10.3 mg/dL 9.9 9.5 8.8(L)  Total Protein 6.0 - 8.5 g/dL - 6.7 -  Total Bilirubin 0.0 - 1.2 mg/dL - 0.3 -  Alkaline Phos 39 - 117 IU/L - 53 -  AST 0 - 40 IU/L - 18 -  ALT 0 - 32 IU/L - 27 -   CBC Latest Ref Rng & Units 07/09/2018 07/07/2018 07/06/2018  WBC 4.0 - 10.5 K/uL 15.7(H) 17.1(H) 15.8(H)   Hemoglobin 12.0 - 15.0 g/dL 13.3 12.9 12.8  Hematocrit 36.0 - 46.0 % 41.4 38.7 39.9  Platelets 150 - 400 K/uL 334 318 322   Lipid Panel     Component Value Date/Time   CHOL 214 (H) 10/27/2018 0945   TRIG 392 (H) 10/27/2018 0945   HDL 35 (L) 10/27/2018 0945   CHOLHDL 5.9 (H) 07/16/2015 1339   VLDL NOT CALC 07/16/2015 1339   LDLCALC 111 (H) 10/27/2018 0945   HEMOGLOBIN A1C Lab Results  Component Value Date   HGBA1C 7.4 (H) 10/27/2018   MPG 154 07/16/2015   TSH Recent Labs    10/27/18 0945  TSH 2.300    PRN Meds:. Medications Discontinued During This Encounter  Medication Reason  . spironolactone (ALDACTONE) 25 MG tablet Reorder   Current Meds  Medication Sig  . ACCU-CHEK AVIVA PLUS test strip U UTD  . ACCU-CHEK SOFTCLIX LANCETS lancets U UTD  . amLODipine (NORVASC) 10 MG tablet Take 1 tablet (10 mg total) by mouth daily. (Patient taking differently: Take 10 mg by mouth every morning. )  . atorvastatin (LIPITOR) 80 MG tablet Take 1 tablet by mouth every evening.   . benazepril (LOTENSIN) 40 MG tablet Take 40 mg by mouth every evening.   . bisoprolol (ZEBETA) 10 MG tablet TAKE 1 TABLET BY MOUTH EVERY DAY  . cetirizine (ZYRTEC) 10 MG tablet Take 10 mg by mouth at bedtime.   . chlorthalidone (HYGROTON) 25 MG tablet Take 25 mg by mouth every morning. 2 table once daily  . escitalopram (LEXAPRO) 20 MG tablet Take 20 mg by mouth every evening.   Marland Kitchen glimepiride (AMARYL) 4 MG tablet Take 2 mg by mouth 2 (two) times daily. 1/2 tablet daily  . linagliptin (TRADJENTA) 5 MG TABS tablet Take 1 tablet (5 mg total) by mouth every morning.  . meloxicam (MOBIC) 15 MG tablet Take 15 mg by mouth every morning.   . metFORMIN (GLUCOPHAGE) 1000 MG tablet Take 1 tablet (1,000 mg total) by mouth 2 (two) times daily with a meal.  . Multiple Vitamin (MULTIVITAMIN) tablet Take 1  tablet by mouth daily.  Marland Kitchen omeprazole (PRILOSEC) 20 MG capsule Take 20 mg by mouth every morning.   Marland Kitchen spironolactone  (ALDACTONE) 25 MG tablet Take 1 tablet (25 mg total) by mouth daily.  . Vitamin D, Ergocalciferol, (DRISDOL) 1.25 MG (50000 UT) CAPS capsule Take 1 capsule (50,000 Units total) by mouth once a week.  . [DISCONTINUED] spironolactone (ALDACTONE) 25 MG tablet TAKE 1 TABLET BY MOUTH DAILY    Cardiac Studies:   Lexiscan Myoview Stress Test 12/27/2018: 1.  Resting EKG normal sinus rhythm, stress EKG nondiagnostic due to pharmacologic stress. 2.  Left ventricle is dilated both in rest and stress images, LV end-diastolic volume 123456 mL. Perfusion images reveal a small sized mild to moderate amount of ischemia in the apical inferior and periapical region.  LVEF elevated at 47%.  This is an intermediate risk study. No previous exam available for comparison  Echocardiogram 11/16/2018:  Normal LV systolic function with EF 66%. Left ventricle cavity is normal in size. Moderate concentric hypertrophy of the left ventricle. Normal global wall motion. Doppler evidence of grade I (impaired) diastolic dysfunction, normal LAP. Calculated EF 66%. Left atrial cavity is moderately dilated at 4.5 cm. Right ventricle cavity is mildly dilated. Normal right ventricular function. Compared to the study done on 12/21/2013, no significant change.  Coronary Angiogram [11/29/2013]: LVEF 55-60%, LAD: mild diffuse proximal calcification, otherwise normal coronary arteries.  Carotid Doppler [12/27/2013]: No evidence of hemodynamically significant stenosis in the bilateral carotid bifurcation vessels.  Assessment:   Coronary artery disease of native artery of native heart with stable angina pectoris ([place]) - Plan: CBC, Basic metabolic panel, Novel Coronavirus, NAA ([facility])  Abnormal nuclear stress test  Essential hypertension  Chronic diastolic (congestive) heart failure (HCC)  Class 3 severe obesity with serious comorbidity and body mass index (BMI) of 40.0 to 44.9 in adult, unspecified obesity type ([place])  EKG  11/02/2018: Sinus bradycardia at 54 bpm, normal axis, low-voltage complexes, no evidence of ischemia.   Recommendations:   I discussed recently obtained stress test results with the patient revealing small mild to moderate ischemia in the apical inferior and periapical region.  She is continuing to have chest discomfort at the end of the day after being active that is potentially angina equivalent.  In view of her stress test findings, previously noted mild CAD on coronary angiogram, and continued chest pain, feel that her best option would be to proceed with coronary angiogram for further evaluation. Schedule for cardiac catheterization, and possible angioplasty. We discussed regarding risks, benefits, alternatives to this including stress testing, CTA and continued medical therapy. Patient wants to proceed. Understands <1-2% risk of death, stroke, MI, urgent CABG, bleeding, infection, renal failure but not limited to these.  Her blood pressure and leg edema has improved with being on Aldactone.  She will continue with this.  She is on Lipitor 80 mg for management of hyperlipidemia.  By lipid panel in September, lipids are not well controlled.  Could consider addition of Zetia to help with this.  I will plan to see her back after her cath for follow-up.  Miquel Dunn, MSN, APRN, FNP-C [facility] [place] Office: [phone] Fax: [phone]

## 2019-01-03 NOTE — H&P (View-Only) (Signed)
Primary Physician:  Vicenta Aly, FNP   Patient ID: [name] [name], female    DOB: 06-19-1950, 68 y.o.   MRN: [id number]  Subjective:    Chief Complaint  Patient presents with  . Chest Pain  . Follow-up  . Results    stress test    HPI: [name] [name]  is a 68 y.o. female  with history of mild coronary artery disease by coronary angiography in 2015 revealing mild LAD diffuse calcification and coronary atherosclerosis, diabetes mellitus which is recently uncontrolled, morbid obesity with obstructive sleep apnea recently established and is presently on CPAP since 2018, mixed hyperlipidemia, chronic palpitations with history of PACs and PVCs that have remained stable.  She was recently seen for persistent chest discomfort described as chest heaviness and leg edema.  Symptoms were felt to be related to weight gain and diastolic dysfunction.  She underwent echocardiogram on 11/16/18 that showed grade 1 diastolic dysfunction. She was started on Aldactone and has had improvement in leg edema. Due to continued symptoms, she underwent lexiscan nuclear stress testing and now presents to discuss results.   Patient continues to report chest pressure at the end of the day after being active doing household chores that gradually resolves.  She mostly notices this while cooking dinner.  She does not have any significant shortness of breath.  She continues to work on weight loss and is being followed by weight loss center.   Past Medical History:  Diagnosis Date  . Carpal tunnel syndrome of right wrist   . Coronary artery disease    per cath 11-29-2013  mild diffuse CAD and calcification in proximal LAD  . Depression   . Diverticulosis   . GERD (gastroesophageal reflux disease)   . History of endometrial cancer 05/2003   FIGO, Grade 1  s/p  TAG w/ BSO  . HTN (hypertension)   . Hyperlipidemia   . OA (osteoarthritis)   . OSA on CPAP    followed by dr Elsworth Soho-- per study 03-26-2012  mod. to  severe OSA , AHI 37/hr  . Type II diabetes mellitus ([place])    followed by pcp  . Urinary incontinence in female   . Vitamin D deficiency     Past Surgical History:  Procedure Laterality Date  . CARDIAC CATHETERIZATION  11/29/2013   dr Einar Gip   mild diffuse noncritial CAD and mild calcification involving the proximal LAD,  LVEF 55-60%  . CARPAL TUNNEL RELEASE Right 07/17/2017   Procedure: CARPAL TUNNEL RELEASE;  Surgeon: Dorna Leitz, MD;  Location: [place];  Service: Orthopedics;  Laterality: Right;  . KNEE ARTHROSCOPY Left 10/ 2004;  05-17-2008   dr graves  . KNEE ARTHROSCOPY Right 11/2012  . LAPAROSCOPIC CHOLECYSTECTOMY  11-19-2006  dr Zella Richer  [facility]  . LEFT HEART CATHETERIZATION WITH CORONARY ANGIOGRAM N/A 11/29/2013   Procedure: LEFT HEART CATHETERIZATION WITH CORONARY ANGIOGRAM;  Surgeon: Laverda Page, MD;  Location: [facility] CATH LAB;  Service: Cardiovascular;  Laterality: N/A;  . TOTAL ABDOMINAL HYSTERECTOMY W/ BILATERAL SALPINGOOPHORECTOMY  06-20-2003    dr Mohammed Kindle  [facility]   endometrial cancer  . TOTAL HIP ARTHROPLASTY Right 09/02/2013   Procedure: RIGHT TOTAL HIP ARTHROPLASTY ANTERIOR APPROACH;  Surgeon: Alta Corning, MD;  Location: [place];  Service: Orthopedics;  Laterality: Right;  . TOTAL KNEE ARTHROPLASTY Left 08/02/2012   Procedure: TOTAL KNEE ARTHROPLASTY;  Surgeon: Alta Corning, MD;  Location: [place];  Service: Orthopedics;  Laterality: Left;  left total knee  arthroplasty  . TUBAL LIGATION  yrs ago    Social History   Socioeconomic History  . Marital status: Married    Spouse name: Zenobia Alderfer  . Number of children: 3  . Years of education: Not on file  . Highest education level: Not on file  Occupational History  . Occupation: housewife  Social Needs  . Financial resource strain: Not on file  . Food insecurity    Worry: Not on file    Inability: Not on file  . Transportation needs    Medical: Not on file    Non-medical: Not on file  Tobacco Use   . Smoking status: Never Smoker  . Smokeless tobacco: Never Used  Substance and Sexual Activity  . Alcohol use: No  . Drug use: No  . Sexual activity: Not on file  Lifestyle  . Physical activity    Days per week: Not on file    Minutes per session: Not on file  . Stress: Not on file  Relationships  . Social Herbalist on phone: Not on file    Gets together: Not on file    Attends religious service: Not on file    Active member of club or organization: Not on file    Attends meetings of clubs or organizations: Not on file    Relationship status: Not on file  . Intimate partner violence    Fear of current or ex partner: Not on file    Emotionally abused: Not on file    Physically abused: Not on file    Forced sexual activity: Not on file  Other Topics Concern  . Not on file  Social History Narrative  . Not on file    Review of Systems  Constitution: Positive for weight gain. Negative for decreased appetite, malaise/fatigue and weight loss.  Eyes: Negative for visual disturbance.  Cardiovascular: Positive for chest pain and leg swelling (improved). Negative for claudication, dyspnea on exertion, orthopnea, palpitations and syncope.  Respiratory: Positive for shortness of breath (improved). Negative for hemoptysis and wheezing.   Endocrine: Negative for cold intolerance and heat intolerance.  Hematologic/Lymphatic: Does not bruise/bleed easily.  Skin: Negative for nail changes.  Musculoskeletal: Negative for muscle weakness and myalgias.  Gastrointestinal: Negative for abdominal pain, change in bowel habit, nausea and vomiting.  Neurological: Negative for difficulty with concentration, dizziness, focal weakness and headaches.  Psychiatric/Behavioral: Negative for altered mental status and suicidal ideas.  All other systems reviewed and are negative.     Objective:  Blood pressure 137/68, pulse (!) 53, temperature 98 F (36.7 C), height 5\' 3"  (1.6 m), weight 247  lb 1.6 oz (112.1 kg), last menstrual period 11/25/2002, SpO2 98 %. Body mass index is 43.77 kg/m.    Physical Exam  Constitutional: She is oriented to person, place, and time. Vital signs are normal. She appears well-developed and well-nourished.  HENT:  Head: Normocephalic and atraumatic.  Neck: Normal range of motion.  Cardiovascular: Normal rate, regular rhythm and intact distal pulses.  Murmur heard.  Low-pitched early systolic murmur is present with a grade of 1/6 at the upper right sternal border. Pulses:      Femoral pulses are 1+ on the right side and 1+ on the left side.      Popliteal pulses are 1+ on the right side and 1+ on the left side.       Dorsalis pedis pulses are 2+ on the right side and 2+ on the left side.  Posterior tibial pulses are 2+ on the right side and 2+ on the left side.  1+ pitting edema bilateral  Pulses difficult to feel due to bodily habitus  Pulmonary/Chest: Effort normal and breath sounds normal. No accessory muscle usage. No respiratory distress.  Abdominal: Soft. Bowel sounds are normal.  Musculoskeletal: Normal range of motion.  Neurological: She is alert and oriented to person, place, and time.  Skin: Skin is warm and dry.  Vitals reviewed.  Radiology: No results found.  Laboratory examination:    CMP Latest Ref Rng & Units 11/17/2018 10/27/2018 07/09/2018  Glucose 65 - 99 mg/dL 147(H) 109(H) 134(H)  BUN 8 - 27 mg/dL 34(H) 27 33(H)  Creatinine 0.57 - 1.00 mg/dL 0.92 0.88 0.80  Sodium 134 - 144 mmol/L 137 140 138  Potassium 3.5 - 5.2 mmol/L 4.8 4.2 3.7  Chloride 96 - 106 mmol/L 100 99 98  CO2 20 - 29 mmol/L 21 24 29   Calcium 8.7 - 10.3 mg/dL 9.9 9.5 8.8(L)  Total Protein 6.0 - 8.5 g/dL - 6.7 -  Total Bilirubin 0.0 - 1.2 mg/dL - 0.3 -  Alkaline Phos 39 - 117 IU/L - 53 -  AST 0 - 40 IU/L - 18 -  ALT 0 - 32 IU/L - 27 -   CBC Latest Ref Rng & Units 07/09/2018 07/07/2018 07/06/2018  WBC 4.0 - 10.5 K/uL 15.7(H) 17.1(H) 15.8(H)   Hemoglobin 12.0 - 15.0 g/dL 13.3 12.9 12.8  Hematocrit 36.0 - 46.0 % 41.4 38.7 39.9  Platelets 150 - 400 K/uL 334 318 322   Lipid Panel     Component Value Date/Time   CHOL 214 (H) 10/27/2018 0945   TRIG 392 (H) 10/27/2018 0945   HDL 35 (L) 10/27/2018 0945   CHOLHDL 5.9 (H) 07/16/2015 1339   VLDL NOT CALC 07/16/2015 1339   LDLCALC 111 (H) 10/27/2018 0945   HEMOGLOBIN A1C Lab Results  Component Value Date   HGBA1C 7.4 (H) 10/27/2018   MPG 154 07/16/2015   TSH Recent Labs    10/27/18 0945  TSH 2.300    PRN Meds:. Medications Discontinued During This Encounter  Medication Reason  . spironolactone (ALDACTONE) 25 MG tablet Reorder   Current Meds  Medication Sig  . ACCU-CHEK AVIVA PLUS test strip U UTD  . ACCU-CHEK SOFTCLIX LANCETS lancets U UTD  . amLODipine (NORVASC) 10 MG tablet Take 1 tablet (10 mg total) by mouth daily. (Patient taking differently: Take 10 mg by mouth every morning. )  . atorvastatin (LIPITOR) 80 MG tablet Take 1 tablet by mouth every evening.   . benazepril (LOTENSIN) 40 MG tablet Take 40 mg by mouth every evening.   . bisoprolol (ZEBETA) 10 MG tablet TAKE 1 TABLET BY MOUTH EVERY DAY  . cetirizine (ZYRTEC) 10 MG tablet Take 10 mg by mouth at bedtime.   . chlorthalidone (HYGROTON) 25 MG tablet Take 25 mg by mouth every morning. 2 table once daily  . escitalopram (LEXAPRO) 20 MG tablet Take 20 mg by mouth every evening.   Marland Kitchen glimepiride (AMARYL) 4 MG tablet Take 2 mg by mouth 2 (two) times daily. 1/2 tablet daily  . linagliptin (TRADJENTA) 5 MG TABS tablet Take 1 tablet (5 mg total) by mouth every morning.  . meloxicam (MOBIC) 15 MG tablet Take 15 mg by mouth every morning.   . metFORMIN (GLUCOPHAGE) 1000 MG tablet Take 1 tablet (1,000 mg total) by mouth 2 (two) times daily with a meal.  . Multiple Vitamin (MULTIVITAMIN) tablet Take 1  tablet by mouth daily.  Marland Kitchen omeprazole (PRILOSEC) 20 MG capsule Take 20 mg by mouth every morning.   Marland Kitchen spironolactone  (ALDACTONE) 25 MG tablet Take 1 tablet (25 mg total) by mouth daily.  . Vitamin D, Ergocalciferol, (DRISDOL) 1.25 MG (50000 UT) CAPS capsule Take 1 capsule (50,000 Units total) by mouth once a week.  . [DISCONTINUED] spironolactone (ALDACTONE) 25 MG tablet TAKE 1 TABLET BY MOUTH DAILY    Cardiac Studies:   Lexiscan Myoview Stress Test 12/27/2018: 1.  Resting EKG normal sinus rhythm, stress EKG nondiagnostic due to pharmacologic stress. 2.  Left ventricle is dilated both in rest and stress images, LV end-diastolic volume 123456 mL. Perfusion images reveal a small sized mild to moderate amount of ischemia in the apical inferior and periapical region.  LVEF elevated at 47%.  This is an intermediate risk study. No previous exam available for comparison  Echocardiogram 11/16/2018:  Normal LV systolic function with EF 66%. Left ventricle cavity is normal in size. Moderate concentric hypertrophy of the left ventricle. Normal global wall motion. Doppler evidence of grade I (impaired) diastolic dysfunction, normal LAP. Calculated EF 66%. Left atrial cavity is moderately dilated at 4.5 cm. Right ventricle cavity is mildly dilated. Normal right ventricular function. Compared to the study done on 12/21/2013, no significant change.  Coronary Angiogram [11/29/2013]: LVEF 55-60%, LAD: mild diffuse proximal calcification, otherwise normal coronary arteries.  Carotid Doppler [12/27/2013]: No evidence of hemodynamically significant stenosis in the bilateral carotid bifurcation vessels.  Assessment:   Coronary artery disease of native artery of native heart with stable angina pectoris ([place]) - Plan: CBC, Basic metabolic panel, Novel Coronavirus, NAA ([facility])  Abnormal nuclear stress test  Essential hypertension  Chronic diastolic (congestive) heart failure (HCC)  Class 3 severe obesity with serious comorbidity and body mass index (BMI) of 40.0 to 44.9 in adult, unspecified obesity type ([place])  EKG  11/02/2018: Sinus bradycardia at 54 bpm, normal axis, low-voltage complexes, no evidence of ischemia.   Recommendations:   I discussed recently obtained stress test results with the patient revealing small mild to moderate ischemia in the apical inferior and periapical region.  She is continuing to have chest discomfort at the end of the day after being active that is potentially angina equivalent.  In view of her stress test findings, previously noted mild CAD on coronary angiogram, and continued chest pain, feel that her best option would be to proceed with coronary angiogram for further evaluation. Schedule for cardiac catheterization, and possible angioplasty. We discussed regarding risks, benefits, alternatives to this including stress testing, CTA and continued medical therapy. Patient wants to proceed. Understands <1-2% risk of death, stroke, MI, urgent CABG, bleeding, infection, renal failure but not limited to these.  Her blood pressure and leg edema has improved with being on Aldactone.  She will continue with this.  She is on Lipitor 80 mg for management of hyperlipidemia.  By lipid panel in September, lipids are not well controlled.  Could consider addition of Zetia to help with this.  I will plan to see her back after her cath for follow-up.  Miquel Dunn, MSN, APRN, FNP-C [facility]. [place] Office: [phone] Fax: [phone]

## 2019-01-14 ENCOUNTER — Other Ambulatory Visit (HOSPITAL_COMMUNITY)
Admission: RE | Admit: 2019-01-14 | Discharge: 2019-01-14 | Disposition: A | Discharge status: Home / Self Care | Payer: Medicare Other | Source: Ambulatory Visit | Attending: Cardiology | Admitting: Cardiology

## 2019-01-14 DIAGNOSIS — Z01812 Encounter for preprocedural laboratory examination: Secondary | ICD-10-CM | POA: Insufficient documentation

## 2019-01-14 DIAGNOSIS — I2510 Atherosclerotic heart disease of native coronary artery without angina pectoris: Secondary | ICD-10-CM | POA: Diagnosis not present

## 2019-01-14 DIAGNOSIS — Z20828 Contact with and (suspected) exposure to other viral communicable diseases: Secondary | ICD-10-CM | POA: Insufficient documentation

## 2019-01-15 LAB — BASIC METABOLIC PANEL
BUN/Creatinine Ratio: 23 (ref 12–28)
BUN: 25 mg/dL (ref 8–27)
CO2: 23 mmol/L (ref 20–29)
Calcium: 10.1 mg/dL (ref 8.7–10.3)
Chloride: 100 mmol/L (ref 96–106)
Creatinine, Ser: 1.07 mg/dL — ABNORMAL HIGH (ref 0.57–1.00)
GFR calc Af Amer: 62 mL/min/{1.73_m2} (ref >59)
GFR calc non Af Amer: 54 mL/min/{1.73_m2} — ABNORMAL LOW (ref >59)
Glucose: 167 mg/dL — ABNORMAL HIGH (ref 65–99)
Potassium: 5.2 mmol/L (ref 3.5–5.2)
Sodium: 141 mmol/L (ref 134–144)

## 2019-01-15 LAB — CBC
Hematocrit: 39.6 % (ref 34.0–46.6)
Hemoglobin: 13.1 g/dL (ref 11.1–15.9)
MCH: 29.4 pg (ref 26.6–33.0)
MCHC: 33.1 g/dL (ref 31.5–35.7)
MCV: 89 fL (ref 79–97)
Platelets: 282 10*3/uL (ref 150–450)
RBC: 4.45 x10E6/uL (ref 3.77–5.28)
RDW: 13.4 % (ref 11.7–15.4)
WBC: 7.9 10*3/uL (ref 3.4–10.8)

## 2019-01-16 LAB — NOVEL CORONAVIRUS, NAA (HOSP ORDER, SEND-OUT TO REF LAB; TAT 18-24 HRS): SARS-CoV-2, NAA: NOT DETECTED

## 2019-01-17 ENCOUNTER — Other Ambulatory Visit: Payer: Self-pay

## 2019-01-18 ENCOUNTER — Other Ambulatory Visit: Payer: Self-pay | Admitting: Cardiology

## 2019-01-18 ENCOUNTER — Encounter (HOSPITAL_COMMUNITY)
Admission: RE | Disposition: A | Discharge status: Home / Self Care | Payer: Self-pay | Source: Home / Self Care | Attending: Cardiology

## 2019-01-18 ENCOUNTER — Other Ambulatory Visit: Payer: Self-pay

## 2019-01-18 ENCOUNTER — Ambulatory Visit (HOSPITAL_COMMUNITY)
Admission: RE | Admit: 2019-01-18 | Discharge: 2019-01-18 | Disposition: A | Discharge status: Home / Self Care | Payer: Medicare Other | Attending: Cardiology | Admitting: Cardiology

## 2019-01-18 DIAGNOSIS — I5032 Chronic diastolic (congestive) heart failure: Secondary | ICD-10-CM | POA: Insufficient documentation

## 2019-01-18 DIAGNOSIS — M1990 Unspecified osteoarthritis, unspecified site: Secondary | ICD-10-CM | POA: Insufficient documentation

## 2019-01-18 DIAGNOSIS — I2510 Atherosclerotic heart disease of native coronary artery without angina pectoris: Secondary | ICD-10-CM | POA: Diagnosis present

## 2019-01-18 DIAGNOSIS — Z95820 Peripheral vascular angioplasty status with implants and grafts: Secondary | ICD-10-CM

## 2019-01-18 DIAGNOSIS — I209 Angina pectoris, unspecified: Secondary | ICD-10-CM | POA: Diagnosis present

## 2019-01-18 DIAGNOSIS — I25119 Atherosclerotic heart disease of native coronary artery with unspecified angina pectoris: Secondary | ICD-10-CM | POA: Insufficient documentation

## 2019-01-18 DIAGNOSIS — G5601 Carpal tunnel syndrome, right upper limb: Secondary | ICD-10-CM | POA: Diagnosis not present

## 2019-01-18 DIAGNOSIS — I2584 Coronary atherosclerosis due to calcified coronary lesion: Secondary | ICD-10-CM | POA: Diagnosis not present

## 2019-01-18 DIAGNOSIS — I25118 Atherosclerotic heart disease of native coronary artery with other forms of angina pectoris: Secondary | ICD-10-CM

## 2019-01-18 DIAGNOSIS — G4733 Obstructive sleep apnea (adult) (pediatric): Secondary | ICD-10-CM | POA: Diagnosis not present

## 2019-01-18 DIAGNOSIS — K219 Gastro-esophageal reflux disease without esophagitis: Secondary | ICD-10-CM | POA: Diagnosis not present

## 2019-01-18 DIAGNOSIS — Z79899 Other long term (current) drug therapy: Secondary | ICD-10-CM | POA: Insufficient documentation

## 2019-01-18 DIAGNOSIS — E119 Type 2 diabetes mellitus without complications: Secondary | ICD-10-CM | POA: Insufficient documentation

## 2019-01-18 DIAGNOSIS — Z6841 Body Mass Index (BMI) 40.0 and over, adult: Secondary | ICD-10-CM | POA: Diagnosis not present

## 2019-01-18 DIAGNOSIS — Z791 Long term (current) use of non-steroidal anti-inflammatories (NSAID): Secondary | ICD-10-CM | POA: Diagnosis not present

## 2019-01-18 DIAGNOSIS — Z7984 Long term (current) use of oral hypoglycemic drugs: Secondary | ICD-10-CM | POA: Insufficient documentation

## 2019-01-18 DIAGNOSIS — E782 Mixed hyperlipidemia: Secondary | ICD-10-CM | POA: Insufficient documentation

## 2019-01-18 DIAGNOSIS — I110 Hypertensive heart disease with heart failure: Secondary | ICD-10-CM | POA: Diagnosis not present

## 2019-01-18 HISTORY — PX: LEFT HEART CATH AND CORONARY ANGIOGRAPHY: CATH118249

## 2019-01-18 HISTORY — PX: CORONARY STENT INTERVENTION: CATH118234

## 2019-01-18 HISTORY — PX: CORONARY ATHERECTOMY: CATH118238

## 2019-01-18 LAB — GLUCOSE, CAPILLARY: Glucose-Capillary: 145 mg/dL — ABNORMAL HIGH (ref 70–99)

## 2019-01-18 LAB — POCT ACTIVATED CLOTTING TIME
Activated Clotting Time: 230 seconds
Activated Clotting Time: 268 seconds

## 2019-01-18 SURGERY — LEFT HEART CATH AND CORONARY ANGIOGRAPHY
Anesthesia: LOCAL

## 2019-01-18 MED ORDER — IOHEXOL 350 MG/ML SOLN
INTRAVENOUS | Status: DC | PRN
  Administered 2019-01-18: 120 mL

## 2019-01-18 MED ORDER — CLOPIDOGREL BISULFATE 75 MG PO TABS: 75.0000 mg | ORAL_TABLET | Freq: Every day | ORAL | 11 refills | Status: DC

## 2019-01-18 MED ORDER — HEPARIN SODIUM (PORCINE) 1000 UNIT/ML IJ SOLN
INTRAMUSCULAR | Status: AC
  Filled 2019-01-18: qty 1

## 2019-01-18 MED ORDER — VERAPAMIL HCL 2.5 MG/ML IV SOLN
INTRAVENOUS | Status: AC
  Filled 2019-01-18: qty 2

## 2019-01-18 MED ORDER — HEPARIN SODIUM (PORCINE) 1000 UNIT/ML IJ SOLN
INTRAMUSCULAR | Status: DC | PRN
  Administered 2019-01-18 (×2): 5000 [IU] via INTRAVENOUS
  Administered 2019-01-18: 3000 [IU] via INTRAVENOUS

## 2019-01-18 MED ORDER — SODIUM CHLORIDE 0.9 % IV SOLN: 250.0000 mL | INTRAVENOUS | Status: DC | PRN

## 2019-01-18 MED ORDER — ONDANSETRON HCL 4 MG/2ML IJ SOLN: 4.0000 mg | Freq: Four times a day (QID) | INTRAMUSCULAR | Status: DC | PRN

## 2019-01-18 MED ORDER — NITROGLYCERIN 1 MG/10 ML FOR IR/CATH LAB
INTRA_ARTERIAL | Status: AC
  Filled 2019-01-18: qty 10

## 2019-01-18 MED ORDER — HEPARIN (PORCINE) IN NACL 1000-0.9 UT/500ML-% IV SOLN
INTRAVENOUS | Status: AC
  Filled 2019-01-18: qty 1000

## 2019-01-18 MED ORDER — CLOPIDOGREL BISULFATE 300 MG PO TABS
ORAL_TABLET | ORAL | Status: AC
  Filled 2019-01-18: qty 2

## 2019-01-18 MED ORDER — SODIUM CHLORIDE 0.9 % WEIGHT BASED INFUSION: 1.0000 mL/kg/h | INTRAVENOUS | Status: DC

## 2019-01-18 MED ORDER — METFORMIN HCL 1000 MG PO TABS: 1000.0000 mg | ORAL_TABLET | Freq: Two times a day (BID) | ORAL | 0 refills | Status: DC

## 2019-01-18 MED ORDER — SODIUM CHLORIDE 0.9% FLUSH: 3.0000 mL | INTRAVENOUS | Status: DC | PRN

## 2019-01-18 MED ORDER — HYDRALAZINE HCL 20 MG/ML IJ SOLN: 5.0000 mg | INTRAMUSCULAR | Status: AC | PRN

## 2019-01-18 MED ORDER — ASPIRIN 81 MG PO CHEW
81.0000 mg | CHEWABLE_TABLET | ORAL | Status: AC
  Administered 2019-01-18: 08:00:00 81 mg via ORAL

## 2019-01-18 MED ORDER — SODIUM CHLORIDE 0.9 % IV BOLUS
500.0000 mL | Freq: Once | INTRAVENOUS | Status: AC
  Administered 2019-01-18: 500 mL via INTRAVENOUS

## 2019-01-18 MED ORDER — LIDOCAINE HCL (PF) 1 % IJ SOLN
INTRAMUSCULAR | Status: AC
  Filled 2019-01-18: qty 30

## 2019-01-18 MED ORDER — VIPERSLIDE LUBRICANT OPTIME
TOPICAL | Status: DC | PRN
  Administered 2019-01-18: 20 mL via SURGICAL_CAVITY

## 2019-01-18 MED ORDER — NITROGLYCERIN 1 MG/10 ML FOR IR/CATH LAB
INTRA_ARTERIAL | Status: DC | PRN
  Administered 2019-01-18 (×3): 200 ug via INTRACORONARY

## 2019-01-18 MED ORDER — FAMOTIDINE IN NACL 20-0.9 MG/50ML-% IV SOLN
INTRAVENOUS | Status: AC | PRN
  Administered 2019-01-18: 20 mg via INTRAVENOUS

## 2019-01-18 MED ORDER — FENTANYL CITRATE (PF) 100 MCG/2ML IJ SOLN
INTRAMUSCULAR | Status: DC | PRN
  Administered 2019-01-18 (×3): 25 ug via INTRAVENOUS

## 2019-01-18 MED ORDER — NITROGLYCERIN 0.4 MG SL SUBL: 0.4000 mg | SUBLINGUAL_TABLET | SUBLINGUAL | 3 refills | Status: DC | PRN

## 2019-01-18 MED ORDER — ASPIRIN 81 MG PO CHEW
CHEWABLE_TABLET | ORAL | Status: AC
  Administered 2019-01-18: 81 mg via ORAL
  Filled 2019-01-18: qty 1

## 2019-01-18 MED ORDER — PANTOPRAZOLE SODIUM 40 MG PO TBEC: 40.0000 mg | DELAYED_RELEASE_TABLET | Freq: Every day | ORAL | 2 refills | Status: DC

## 2019-01-18 MED ORDER — CLOPIDOGREL BISULFATE 75 MG PO TABS: 75.0000 mg | ORAL_TABLET | Freq: Every day | ORAL | Status: DC

## 2019-01-18 MED ORDER — LIDOCAINE HCL (PF) 1 % IJ SOLN
INTRAMUSCULAR | Status: DC | PRN
  Administered 2019-01-18: 5 mL

## 2019-01-18 MED ORDER — CLOPIDOGREL BISULFATE 75 MG PO TABS: 75.0000 mg | ORAL_TABLET | Freq: Every day | ORAL | 0 refills | Status: DC

## 2019-01-18 MED ORDER — HEPARIN (PORCINE) IN NACL 1000-0.9 UT/500ML-% IV SOLN
INTRAVENOUS | Status: DC | PRN
  Administered 2019-01-18 (×2): 500 mL

## 2019-01-18 MED ORDER — SODIUM CHLORIDE 0.9 % IV SOLN
INTRAVENOUS | Status: AC | PRN
  Administered 2019-01-18: 1000 mL via INTRAVENOUS

## 2019-01-18 MED ORDER — ACETAMINOPHEN 325 MG PO TABS: 650.0000 mg | ORAL_TABLET | ORAL | Status: DC | PRN

## 2019-01-18 MED ORDER — VERAPAMIL HCL 2.5 MG/ML IV SOLN
INTRAVENOUS | Status: DC | PRN
  Administered 2019-01-18: 5 mL via INTRA_ARTERIAL

## 2019-01-18 MED ORDER — MIDAZOLAM HCL 2 MG/2ML IJ SOLN
INTRAMUSCULAR | Status: AC
  Filled 2019-01-18: qty 2

## 2019-01-18 MED ORDER — FENTANYL CITRATE (PF) 100 MCG/2ML IJ SOLN
INTRAMUSCULAR | Status: AC
  Filled 2019-01-18: qty 2

## 2019-01-18 MED ORDER — CLOPIDOGREL BISULFATE 300 MG PO TABS
ORAL_TABLET | ORAL | Status: DC | PRN
  Administered 2019-01-18: 600 mg via ORAL

## 2019-01-18 MED ORDER — SODIUM CHLORIDE 0.9% FLUSH: 3.0000 mL | Freq: Two times a day (BID) | INTRAVENOUS | Status: DC

## 2019-01-18 MED ORDER — MIDAZOLAM HCL 2 MG/2ML IJ SOLN
INTRAMUSCULAR | Status: DC | PRN
  Administered 2019-01-18 (×2): 1 mg via INTRAVENOUS

## 2019-01-18 MED ORDER — SODIUM CHLORIDE 0.9 % WEIGHT BASED INFUSION: 3.0000 mL/kg/h | INTRAVENOUS | Status: DC

## 2019-01-18 SURGICAL SUPPLY — 20 items
BALLN SAPPHIRE 3.0X20 (BALLOONS) ×2
BALLN SAPPHIRE ~~LOC~~ 3.5X18 (BALLOONS) ×2 IMPLANT
BALLOON SAPPHIRE 3.0X20 (BALLOONS) ×1 IMPLANT
CATH LAUNCHER 6FR EBU3.5 (CATHETERS) ×2 IMPLANT
CATH OPTITORQUE TIG 4.0 5F (CATHETERS) ×2 IMPLANT
CROWN DIAMONDBACK CLASSIC 1.25 (BURR) ×2 IMPLANT
DEVICE RAD COMP TR BAND LRG (VASCULAR PRODUCTS) ×2 IMPLANT
GLIDESHEATH SLEND A-KIT 6F 22G (SHEATH) ×2 IMPLANT
GUIDEWIRE INQWIRE 1.5J.035X260 (WIRE) ×1 IMPLANT
INQWIRE 1.5J .035X260CM (WIRE) ×2
KIT ENCORE 26 ADVANTAGE (KITS) ×2 IMPLANT
KIT HEART LEFT (KITS) ×2 IMPLANT
LUBRICANT VIPERSLIDE CORONARY (MISCELLANEOUS) ×2 IMPLANT
PACK CARDIAC CATHETERIZATION (CUSTOM PROCEDURE TRAY) ×2 IMPLANT
SHEATH PROBE COVER 6X72 (BAG) ×2 IMPLANT
STENT RESOLUTE ONYX 3.5X30 (Permanent Stent) ×2 IMPLANT
TRANSDUCER W/STOPCOCK (MISCELLANEOUS) ×2 IMPLANT
TUBING CIL FLEX 10 FLL-RA (TUBING) ×2 IMPLANT
WIRE COUGAR XT STRL 190CM (WIRE) ×2 IMPLANT
WIRE VIPERWIRE COR FLEX .012 (WIRE) ×2 IMPLANT

## 2019-01-18 NOTE — Interval H&P Note (Signed)
History and Physical Interval Note:  01/18/2019 10:14 AM  [name] [name]  has presented today for surgery, with the diagnosis of chest pain, shortness of breath.  The various methods of treatment have been discussed with the patient and family. After consideration of risks, benefits and other options for treatment, the patient has consented to  Procedure(s): LEFT HEART CATH AND CORONARY ANGIOGRAPHY (N/A) CORONARY STENT INTERVENTION (N/A) CORONARY ATHERECTOMY (N/A) as a surgical intervention.  The patient's history has been reviewed, patient examined, no change in status, stable for surgery.  I have reviewed the patient's chart and labs.  Questions were answered to the patient's satisfaction.   Symptom Status: Ischemic Symptoms Non-invasive Testing: Indeterminate If no or indeterminate stress test, FFR/iFR results in all diseased vessels: Not done Diabetes Mellitus: Yes S/P CABG: No Antianginal therapy (number of long-acting drugs): >=2 Patient undergoing renal transplant: No Patient undergoing percutaneous valve procedure: No   1 Vessel Disease No proximal LAD involvement, No proximal left dominant LCX involvement  PCI: Not rated  CABG: Not rated Proximal left dominant LCX involvement  PCI: Not rated  CABG: Not rated Proximal LAD involvement  PCI: Not rated  CABG: Not rated  2 Vessel Disease No proximal LAD involvement  PCI: Not rated  CABG: Not rated Proximal LAD involvement  PCI: Not rated  CABG: Not rated  3 Vessel Disease Low disease complexity (e.g., focal stenoses, SYNTAX <=22)  PCI: Not rated  CABG: Not rated Intermediate or high disease complexity (e.g., SYNTAX >=23)  PCI: Not rated  CABG: Not rated  Left Main Disease Isolated LMCA disease: ostial or midshaft  PCI: A (7);  Indication 24  CABG: A (9);  Indication 24 Isolated LMCA disease: bifurcation involvement  PCI: M (6);  Indication 25  CABG: A (9);  Indication 25 LMCA ostial or midshaft, concurrent  low disease burden multivessel disease (e.g., 1-2 additional focal stenoses, SYNTAX <=22)  PCI: A (7);  Indication 26  CABG: A (9);  Indication 26 LMCA ostial or midshaft, concurrent intermediate or high disease burden multivessel disease (e.g., 1-2 additional bifurcation stenoses, long stenoses, SYNTAX >=23)  PCI: M (4);  Indication 27  CABG: A (9);  Indication 27 LMCA bifurcation involvement, concurrent low disease burden multivessel disease (e.g., 1-2 additional focal stenoses, SYNTAX <=22)  PCI: M (6);  Indication 28  CABG: A (9);  Indication 28 LMCA bifurcation involvement, concurrent intermediate or high disease burden multivessel disease (e.g., 1-2 additional bifurcation stenoses, long stenoses, SYNTAX >=23)  PCI: R (3);  Indication 29  CABG: A (9);  Indication 29  Notes:  A indicates appropriate. M indicates may be appropriate. R indicates rarely appropriate. Number in parentheses is median score for that indication. Reclassify indicates number of functionally diseased vessels should be decreased given negative FFR/iFR. Re-evaluate the scenario interpreting any FFR/iFR negative vessel as being not significantly stenosed.  Disease means involved vessel provides flow to a sufficient amount of myocardium to be clinically important.  If FFR testing indicates a vessel is not significant, that vessel should not be considered diseased (and the patient should be reclassified with respect to extent of functionally significant disease).  Proximal LAD + proximal left dominant LCX is considered 3 vessel CAD  2 Vessel CAD with FFR/iFR abnormal in only 1 but not both is considered 1 vessel CAD  Disease complexity includes occlusion, bifurcation, trifurcation, ostial, >20 mm, tortuosity, calcification, thrombus  LMCA disease is >=50% by angiography, MLD <2.8 mm, MLA <6 mm2; MLA 6-7.5 mm2 requires  further physiologic  See Table B for risk stratification based on noninvasive testing  Journal of the  [facility] of Cardiology Mar 2017, 23391; DOI: 10.1016/j.[email or web address] [email or web address].full-text.pdf This App  2018 by the Society for Cardiovascular Angiography and Interventions   Adrian Prows

## 2019-01-18 NOTE — Interval H&P Note (Signed)
History and Physical Interval Note:  01/18/2019 8:46 AM  [name] [name]  has presented today for surgery, with the diagnosis of chest pain, shortness of breath.  The various methods of treatment have been discussed with the patient and family. After consideration of risks, benefits and other options for treatment, the patient has consented to  Procedure(s): LEFT HEART CATH AND CORONARY ANGIOGRAPHY (N/A) as a surgical intervention.  The patient's history has been reviewed, patient examined, no change in status, stable for surgery.  I have reviewed the patient's chart and labs.  Questions were answered to the patient's satisfaction.   Symptom Status: Ischemic Symptoms Non-invasive Testing: Indeterminate If no or indeterminate stress test, FFR/iFR results in all diseased vessels: Not done Diabetes Mellitus: Yes S/P CABG: No Antianginal therapy (number of long-acting drugs): 1 Patient undergoing renal transplant: No Patient undergoing percutaneous valve procedure: No   1 Vessel Disease No proximal LAD involvement, No proximal left dominant LCX involvement  PCI: Not rated  CABG: Not rated Proximal left dominant LCX involvement  PCI: Not rated  CABG: Not rated Proximal LAD involvement  PCI: Not rated  CABG: Not rated  2 Vessel Disease No proximal LAD involvement  PCI: Not rated  CABG: Not rated Proximal LAD involvement  PCI: Not rated  CABG: Not rated  3 Vessel Disease Low disease complexity (e.g., focal stenoses, SYNTAX <=22)  PCI: Not rated  CABG: Not rated Intermediate or high disease complexity (e.g., SYNTAX >=23)  PCI: Not rated  CABG: Not rated  Left Main Disease Isolated LMCA disease: ostial or midshaft  PCI: A (7);  Indication 24  CABG: A (9);  Indication 24 Isolated LMCA disease: bifurcation involvement  PCI: M (5);  Indication 25  CABG: A (9);  Indication 25 LMCA ostial or midshaft, concurrent low disease burden multivessel disease (e.g., 1-2 additional focal  stenoses, SYNTAX <=22)  PCI: A (7);  Indication 26  CABG: A (9);  Indication 26 LMCA ostial or midshaft, concurrent intermediate or high disease burden multivessel disease (e.g., 1-2 additional bifurcation stenoses, long stenoses, SYNTAX >=23)  PCI: M (4);  Indication 27  CABG: A (9);  Indication 27 LMCA bifurcation involvement, concurrent low disease burden multivessel disease (e.g., 1-2 additional focal stenoses, SYNTAX <=22)  PCI: M (5);  Indication 28  CABG: A (9);  Indication 28 LMCA bifurcation involvement, concurrent intermediate or high disease burden multivessel disease (e.g., 1-2 additional bifurcation stenoses, long stenoses, SYNTAX >=23)  PCI: R (3);  Indication 29  CABG: A (9);  Indication 29  Notes:  A indicates appropriate. M indicates may be appropriate. R indicates rarely appropriate. Number in parentheses is median score for that indication. Reclassify indicates number of functionally diseased vessels should be decreased given negative FFR/iFR. Re-evaluate the scenario interpreting any FFR/iFR negative vessel as being not significantly stenosed.  Disease means involved vessel provides flow to a sufficient amount of myocardium to be clinically important.  If FFR testing indicates a vessel is not significant, that vessel should not be considered diseased (and the patient should be reclassified with respect to extent of functionally significant disease).  Proximal LAD + proximal left dominant LCX is considered 3 vessel CAD  2 Vessel CAD with FFR/iFR abnormal in only 1 but not both is considered 1 vessel CAD  Disease complexity includes occlusion, bifurcation, trifurcation, ostial, >20 mm, tortuosity, calcification, thrombus  LMCA disease is >=50% by angiography, MLD <2.8 mm, MLA <6 mm2; MLA 6-7.5 mm2 requires further physiologic  See Table B for  risk stratification based on noninvasive testing  Journal of the [employer] of Cardiology Mar 2017, 23391; DOI:  10.1016/j.jacc.2017.02.001 [employer].[email or web address].full-text.pdf This App  2018 by the Society for Cardiovascular Angiography and Interventions  Adrian Prows

## 2019-01-18 NOTE — Progress Notes (Signed)
[id number] Education completed with pt who voiced understanding. Stressed importance of plavix with stent. Discussed NTG use, heart healthy food choices, carb counting and walking for ex. Discussed CRP 2 and referred to [facility]. Pt stated not good with APPs so did not refer for Virtual program. Graylon Good RN BSN 01/18/2019 12:06 PM

## 2019-01-18 NOTE — Progress Notes (Signed)
Report given to Amy Gilbert,RN 

## 2019-01-18 NOTE — Discharge Instructions (Signed)
Coronary Angiogram With Stent, Care After °This sheet gives you information about how to care for yourself after your procedure. Your health care provider may also give you more specific instructions. If you have problems or questions, contact your health care provider. °What can I expect after the procedure? °After your procedure, it is common to have: °· Bruising in the area where a small, thin tube (catheter) was inserted. This usually fades within 1-2 weeks. °· Blood collecting in the tissue (hematoma) that may be painful to the touch. It should usually decrease in size and tenderness within 1-2 weeks. °Follow these instructions at home: °Insertion area care °· Do not take baths, swim, or use a hot tub until your health care provider approves. °· You may shower 24-48 hours after the procedure or as directed by your health care provider. °· Follow instructions from your health care provider about how to take care of your incision. Make sure you: °? Wash your hands with soap and water before you change your bandage (dressing). If soap and water are not available, use hand sanitizer. °? Change your dressing as told by your health care provider. °? Leave stitches (sutures), skin glue, or adhesive strips in place. These skin closures may need to stay in place for 2 weeks or longer. If adhesive strip edges start to loosen and curl up, you may trim the loose edges. Do not remove adhesive strips completely unless your health care provider tells you to do that. °· Remove the bandage (dressing) and gently wash the catheter insertion site with plain soap and water. °· Pat the area dry with a clean towel. Do not rub the area, because that may cause bleeding. °· Do not apply powder or lotion to the incision area. °· Check your incision area every day for signs of infection. Check for: °? More redness, swelling, or pain. °? More fluid or blood. °? Warmth. °? Pus or a bad smell. °Activity °· Do not drive for 24 hours if you  were given a medicine to help you relax (sedative). °· Do not lift anything that is heavier than 10 lb (4.5 kg) for 5 days after your procedure or as directed by your health care provider. °· Ask your health care provider when it is okay for you: °? To return to work or school. °? To resume usual physical activities or sports. °? To resume sexual activity. °Eating and drinking ° °· Eat a heart-healthy diet. This should include plenty of fresh fruits and vegetables. °· Avoid the following types of food: °? Food that is high in salt. °? Canned or highly processed food. °? Food that is high in saturated fat or sugar. °? Fried food. °· Limit alcohol intake to no more than 1 drink a day for non-pregnant women and 2 drinks a day for men. One drink equals 12 oz of beer, 5 oz of wine, or 1½ oz of hard liquor. °Lifestyle ° °· Do not use any products that contain nicotine or tobacco, such as cigarettes and e-cigarettes. If you need help quitting, ask your health care provider. °· Take steps to manage and control your weight. °· Get regular exercise. °· Manage your blood pressure. °· Manage other health problems, such as diabetes. °General instructions °· Take over-the-counter and prescription medicines only as told by your health care provider. Blood thinners may be prescribed after your procedure to improve blood flow through the stent. °· If you need an MRI after your heart stent has been placed,   be sure to tell the health care provider who orders the MRI that you have a heart stent. °· Keep all follow-up visits as directed by your health care provider. This is important. °Contact a health care provider if: °· You have a fever. °· You have chills. °· You have increased bleeding from the catheter insertion area. Hold pressure on the area. °Get help right away if: °· You develop chest pain or shortness of breath. °· You feel faint or you pass out. °· You have unusual pain at the catheter insertion area. °· You have redness,  warmth, or swelling at the catheter insertion area. °· You have drainage (other than a small amount of blood on the dressing) from the catheter insertion area. °· The catheter insertion area is bleeding, and the bleeding does not stop after 30 minutes of holding steady pressure on the area. °· You develop bleeding from any other place, such as from your rectum. There may be bright red blood in your urine or stool, or it may appear as black, tarry stool. °This information is not intended to replace advice given to you by your health care provider. Make sure you discuss any questions you have with your health care provider. °Document Released: 08/30/2004 Document Revised: 01/23/2017 Document Reviewed: 11/08/2015 °Elsevier Patient Education © 2020 [employer] ° °

## 2019-01-19 ENCOUNTER — Encounter (HOSPITAL_COMMUNITY): Payer: Self-pay | Admitting: Cardiology

## 2019-01-24 ENCOUNTER — Other Ambulatory Visit: Payer: Self-pay

## 2019-01-24 ENCOUNTER — Encounter (INDEPENDENT_AMBULATORY_CARE_PROVIDER_SITE_OTHER): Payer: Self-pay | Admitting: Family Medicine

## 2019-01-24 ENCOUNTER — Telehealth (INDEPENDENT_AMBULATORY_CARE_PROVIDER_SITE_OTHER): Payer: Medicare Other | Admitting: Family Medicine

## 2019-01-24 DIAGNOSIS — E1165 Type 2 diabetes mellitus with hyperglycemia: Secondary | ICD-10-CM | POA: Diagnosis not present

## 2019-01-24 DIAGNOSIS — E559 Vitamin D deficiency, unspecified: Secondary | ICD-10-CM | POA: Diagnosis not present

## 2019-01-24 DIAGNOSIS — Z6841 Body Mass Index (BMI) 40.0 and over, adult: Secondary | ICD-10-CM

## 2019-01-24 MED ORDER — VITAMIN D (ERGOCALCIFEROL) 1.25 MG (50000 UNIT) PO CAPS: 50000.0000 [IU] | ORAL_CAPSULE | ORAL | 0 refills | Status: DC

## 2019-01-24 NOTE — Progress Notes (Signed)
Office: [phone]  /  Fax: [phone] TeleHealth Visit:  [name] [name] has verbally consented to this TeleHealth visit today. The patient is located at home, the provider is located at the [employer] [facility] office. The participants in this visit include the listed provider and patient and any and all parties involved. The visit was conducted today via telephone. [name] [name] was unable to use realtime audiovisual technology today and the telehealth visit was conducted via telephone (time spent on call 17 minutes, 10:16am-10:33am).  HPI:   Chief Complaint: OBESITY Chona is here to discuss her progress with her obesity treatment plan. She is on the Category 2 plan +100 calories and she is following her eating plan approximately 80 % of the time. She states she is walking 15 minutes 5 times per week. [name] [name] had cardiac angioplasty and stent insertion 01/18/19 and she is doing well. She is status post COVID infection in April 2019. [name] [name] has maintained her weight. She is sticking to plan very well overall but does admit to being off the plan on Thanksgiving.[name] [name] is eating the prescribed protein at all meals.  We were unable to weigh the patient today for this TeleHealth visit. She feels as if she has maintained weight since her last visit (weight not reported). She has lost 4 lbs since starting treatment with [name].  Vitamin D deficiency [name] [name] has a diagnosis of vitamin D deficiency. Her last vitamin D level was at 45.6 on 10/27/18 and was not at goal. She is currently taking vit D and denies nausea, vomiting or muscle weakness.  Diabetes II with hyperglycemia [name] [name] has a diagnosis of diabetes type II and she is not well controlled. Last A1c was at 7.4 on 11/01/18. [name] [name] states fasting BGs range between 99 and 130 and 2 hour post prandial BGs range between 130 and 150. She denies any hypoglycemia. DM is managed by her PCP. She is on Tradjenta, Metformin and Glimepiride.  She has been working on intensive lifestyle modifications including diet, exercise, and weight loss to help control her blood glucose levels.  ASSESSMENT AND PLAN:  Vitamin D deficiency - Plan: Vitamin D, Ergocalciferol, (DRISDOL) 1.25 MG (50000 UT) CAPS capsule  Type 2 diabetes mellitus with hyperglycemia, without long-term current use of insulin (HCC)  Class 3 severe obesity with serious comorbidity and body mass index (BMI) of 40.0 to 44.9 in adult, unspecified obesity type ([place])  PLAN:  Vitamin D Deficiency [name] [name] was informed that low vitamin D levels contributes to fatigue and are associated with obesity, breast, and colon cancer. [name] [name] agrees to continue to take prescription Vit D @50 ,000 IU every week #4 with no refills and she will follow up for routine testing of vitamin D, at least 2-3 times per year. She was informed of the risk of over-replacement of vitamin D and agrees to not increase her dose unless she discusses this with [name] first. We will check vitamin D level at the next visit and [name] [name] agrees to follow up as directed.  Diabetes II . We will check A1c at the next visit. [name] [name] will continue all medications and follow up at the agreed upon time.  Obesity Redith is currently in the action stage of change. As such, her goal is to continue with weight loss efforts She has agreed to follow the Category 2 plan +100 calories [name] [name] will continue walking for 15 minutes, 5 times per week and she will add hand weights and seated leg lifts for weight loss and overall  health benefits. We discussed the following Behavioral Modification Strategies today: planning for success and increasing lean protein intake  [name] [name] has agreed to follow up with our clinic in 2 weeks. She was informed of the importance of frequent follow up visits to maximize her success with intensive lifestyle modifications for her multiple health conditions.  ALLERGIES: Allergies  Allergen  Reactions  . Codeine Palpitations    MEDICATIONS: Current Outpatient Medications on File Prior to Visit  Medication Sig Dispense Refill  . ACCU-CHEK AVIVA PLUS test strip U UTD  5  . ACCU-CHEK SOFTCLIX LANCETS lancets U UTD  11  . amLODipine (NORVASC) 10 MG tablet Take 1 tablet (10 mg total) by mouth daily. (Patient taking differently: Take 10 mg by mouth every morning. ) 30 tablet 2  . aspirin EC 81 MG tablet Take 81 mg by mouth daily.    [name] [name] atorvastatin (LIPITOR) 80 MG tablet Take 80 mg by mouth every evening.   5  . benazepril (LOTENSIN) 40 MG tablet Take 40 mg by mouth every evening.     . bisoprolol (ZEBETA) 10 MG tablet TAKE 1 TABLET BY MOUTH EVERY DAY 90 tablet 1  . cetirizine (ZYRTEC) 10 MG tablet Take 10 mg by mouth at bedtime.     . chlorthalidone (HYGROTON) 25 MG tablet Take 50 mg by mouth every morning. 2 table once daily  3  . clopidogrel (PLAVIX) 75 MG tablet Take 1 tablet (75 mg total) by mouth daily. 30 tablet 11  . escitalopram (LEXAPRO) 20 MG tablet Take 20 mg by mouth every evening.     [name] [name] glimepiride (AMARYL) 4 MG tablet Take 2 mg by mouth 2 (two) times daily. 1/2 tablet daily    . linagliptin (TRADJENTA) 5 MG TABS tablet Take 1 tablet (5 mg total) by mouth every morning. 30 tablet 0  . metFORMIN (GLUCOPHAGE) 1000 MG tablet Take 1 tablet (1,000 mg total) by mouth 2 (two) times daily with a meal. 60 tablet 0  . Misc Natural Products ([name] [name]) Take 1 capsule by mouth daily.    . Multiple Vitamin (MULTIVITAMIN) tablet Take 1 tablet by mouth daily.    . nitroGLYCERIN (NITROSTAT) 0.4 MG SL tablet Place 1 tablet (0.4 mg total) under the tongue every 5 (five) minutes as needed for chest pain. 25 tablet 3  . Omega-3 Fatty Acids (FISH OIL) 1200 MG CAPS Take 1,200 mg by mouth daily.    . pantoprazole (PROTONIX) 40 MG tablet Take 1 tablet (40 mg total) by mouth daily before breakfast. 30 tablet 2  . spironolactone (ALDACTONE) 25 MG tablet Take 1 tablet (25 mg total) by  mouth daily. 90 tablet 1   No current facility-administered medications on file prior to visit.     PAST MEDICAL HISTORY: Past Medical History:  Diagnosis Date  . Carpal tunnel syndrome of right wrist   . Coronary artery disease    per cath 11-29-2013  mild diffuse CAD and calcification in proximal LAD  . Depression   . Diverticulosis   . GERD (gastroesophageal reflux disease)   . History of endometrial cancer 05/2003   FIGO, Grade 1  s/p  TAG w/ BSO  . HTN (hypertension)   . Hyperlipidemia   . OA (osteoarthritis)   . OSA on CPAP    followed by dr Elsworth Soho-- per study 03-26-2012  mod. to severe OSA , AHI 37/hr  . Type II diabetes mellitus ([place])    followed by pcp  . Urinary incontinence in  female   . Vitamin D deficiency     PAST SURGICAL HISTORY: Past Surgical History:  Procedure Laterality Date  . CARDIAC CATHETERIZATION  11/29/2013   dr Einar Gip   mild diffuse noncritial CAD and mild calcification involving the proximal LAD,  LVEF 55-60%  . CARPAL TUNNEL RELEASE Right 07/17/2017   Procedure: CARPAL TUNNEL RELEASE;  Surgeon: Dorna Leitz, MD;  Location: [place];  Service: Orthopedics;  Laterality: Right;  . CORONARY ATHERECTOMY N/A 01/18/2019   Procedure: CORONARY ATHERECTOMY;  Surgeon: Adrian Prows, MD;  Location: [place] [facility];  Service: Cardiovascular;  Laterality: N/A;  . CORONARY STENT INTERVENTION N/A 01/18/2019   Procedure: CORONARY STENT INTERVENTION;  Surgeon: Adrian Prows, MD;  Location: [place] [facility];  Service: Cardiovascular;  Laterality: N/A;  . KNEE ARTHROSCOPY Left 10/ 2004;  05-17-2008   dr graves  . KNEE ARTHROSCOPY Right 11/2012  . LAPAROSCOPIC CHOLECYSTECTOMY  11-19-2006  dr Zella Richer  [facility]  . LEFT HEART CATH AND CORONARY ANGIOGRAPHY N/A 01/18/2019   Procedure: LEFT HEART CATH AND CORONARY ANGIOGRAPHY;  Surgeon: Adrian Prows, MD;  Location: [place] [facility];  Service: Cardiovascular;  Laterality: N/A;  . LEFT HEART CATHETERIZATION WITH  CORONARY ANGIOGRAM N/A 11/29/2013   Procedure: LEFT HEART CATHETERIZATION WITH CORONARY ANGIOGRAM;  Surgeon: Laverda Page, MD;  Location: [facility];  Service: Cardiovascular;  Laterality: N/A;  . TOTAL ABDOMINAL HYSTERECTOMY W/ BILATERAL SALPINGOOPHORECTOMY  06-20-2003    dr Mohammed Kindle  [facility]   endometrial cancer  . TOTAL HIP ARTHROPLASTY Right 09/02/2013   Procedure: RIGHT TOTAL HIP ARTHROPLASTY ANTERIOR APPROACH;  Surgeon: Alta Corning, MD;  Location: [place];  Service: Orthopedics;  Laterality: Right;  . TOTAL KNEE ARTHROPLASTY Left 08/02/2012   Procedure: TOTAL KNEE ARTHROPLASTY;  Surgeon: Alta Corning, MD;  Location: [place];  Service: Orthopedics;  Laterality: Left;  left total knee arthroplasty  . TUBAL LIGATION  yrs ago    SOCIAL HISTORY: Social History   Tobacco Use  . Smoking status: Never Smoker  . Smokeless tobacco: Never Used  Substance Use Topics  . Alcohol use: No  . Drug use: No    FAMILY HISTORY: Family History  Problem Relation Age of Onset  . Heart disease Father 70       CABG  . Lung cancer Father 33       lung  . Hypertension Father   . Diabetes Father   . High Cholesterol Father   . Diabetes type II Father   . Heart disease Sister 39       CABG  . Diabetes Sister 54  . Heart disease Brother 85       MI with stents  . Hypertension Mother   . Diabetes Mother   . High Cholesterol Mother   . Sudden death Mother   . Thyroid disease Mother   . Obesity Mother   . Hypertension Sister        x2   . Diabetes Sister   . Hypertension Brother   . Diabetes Brother   . Cancer Sister        high grade Neuroendocrine carcinoma right axilla  . Pancreatic cancer Other        x 3 - 2 MAunts and 1 P Aunts.   . Lupus Other        Pat Aunt    ROS: Review of Systems  Constitutional: Negative for weight loss.  Gastrointestinal: Negative for nausea and vomiting.  Musculoskeletal:  Negative for muscle weakness  Endo/Heme/Allergies:       Negative for  hypoglycemia    PHYSICAL EXAM: Pt in no acute distress  RECENT LABS AND TESTS: BMET    Component Value Date/Time   NA 141 01/14/2019 1331   K 5.2 01/14/2019 1331   CL 100 01/14/2019 1331   CO2 23 01/14/2019 1331   GLUCOSE 167 (H) 01/14/2019 1331   GLUCOSE 134 (H) 07/09/2018 0457   BUN 25 01/14/2019 1331   CREATININE 1.07 (H) 01/14/2019 1331   CREATININE 1.24 (H) 07/16/2015 1339   CALCIUM 10.1 01/14/2019 1331   GFRNONAA 54 (L) 01/14/2019 1331   GFRAA 62 01/14/2019 1331   Lab Results  Component Value Date   HGBA1C 7.4 (H) 10/27/2018   HGBA1C 6.7 (H) 02/01/2018   HGBA1C 6.7 (H) 10/06/2017   HGBA1C 6.9 (H) 04/29/2017   HGBA1C 7.0 (H) 07/16/2015   Lab Results  Component Value Date   INSULIN 21.4 02/01/2018   INSULIN 24.9 10/06/2017   INSULIN 36.0 (H) 04/29/2017   CBC    Component Value Date/Time   WBC 7.9 01/14/2019 1331   WBC 15.7 (H) 07/09/2018 0457   RBC 4.45 01/14/2019 1331   RBC 4.64 07/09/2018 0457   HGB 13.1 01/14/2019 1331   HGB 11.2 (L) 07/11/2014 1340   HCT 39.6 01/14/2019 1331   PLT 282 01/14/2019 1331   MCV 89 01/14/2019 1331   MCH 29.4 01/14/2019 1331   MCH 28.7 07/09/2018 0457   MCHC 33.1 01/14/2019 1331   MCHC 32.1 07/09/2018 0457   RDW 13.4 01/14/2019 1331   LYMPHSABS 1.1 07/05/2018 0535   LYMPHSABS 2.1 04/29/2017 0935   MONOABS 0.5 07/05/2018 0535   EOSABS 0.0 07/05/2018 0535   EOSABS 0.2 04/29/2017 0935   BASOSABS 0.1 07/05/2018 0535   BASOSABS 0.0 04/29/2017 0935   Iron/TIBC/Ferritin/ %Sat    Component Value Date/Time   FERRITIN 240 07/09/2018 0458   Lipid Panel     Component Value Date/Time   CHOL 214 (H) 10/27/2018 0945   TRIG 392 (H) 10/27/2018 0945   HDL 35 (L) 10/27/2018 0945   CHOLHDL 5.9 (H) 07/16/2015 1339   VLDL NOT CALC 07/16/2015 1339   LDLCALC 111 (H) 10/27/2018 0945   Hepatic Function Panel     Component Value Date/Time   PROT 6.7 10/27/2018 0945   ALBUMIN 4.3 10/27/2018 0945   AST 18 10/27/2018 0945   ALT  27 10/27/2018 0945   ALKPHOS 53 10/27/2018 0945   BILITOT 0.3 10/27/2018 0945      Component Value Date/Time   TSH 2.300 10/27/2018 0945   TSH 3.010 04/29/2017 0935   TSH 3.96 07/16/2015 1339    Results for AKUA, GRUBICH (MRN [id number]) as of 01/24/2019 15:38  Ref. Range 10/27/2018 09:45  Vitamin D, 25-Hydroxy Latest Ref Range: 30.0 - 100.0 ng/mL 45.6    I, Doreene Nest, am acting as Location manager for [employer], FNP-C.  I have reviewed the above documentation for accuracy and completeness, and I agree with the above.  - Elyna Pangilinan, FNP-C.

## 2019-01-27 NOTE — Progress Notes (Signed)
Primary Physician:  Vicenta Aly, FNP   Patient ID: [name] [name], female    DOB: [birth year], 68 y.o.   MRN: [id number]  Subjective:    Chief Complaint  Patient presents with  . Coronary Artery Disease  . Follow-up    10 day post cath w/stent    HPI: [name] [name]  is a 68 y.o. female  with diabetes mellitus which is recently uncontrolled, morbid obesity with obstructive sleep apnea recently established and is presently on CPAP since 2018, mixed hyperlipidemia, chronic palpitations with history of PACs and PVCs that have remained stable, and CAD s/p stent to LAD on 01/18/19.   She was recently seen for persistent chest discomfort described as chest heaviness and leg edema.  Symptoms were felt to be related to weight gain and diastolic dysfunction.  She underwent echocardiogram on 11/16/18 that showed grade 1 diastolic dysfunction. She was started on Aldactone and has had improvement in leg edema. Lexiscan nuclear stress testing on 12/27/2018 was considered intermediate risk. In view of persistent symptoms and stress test findings, she underwent coronary angiogram on 01/18/2019 and found to have progression of LAD disease to high grade, in which she underwent PCI. She now presents for follow up.   Patient reports that she is feeling much better since the procedure.  She only has occasional twinges of chest discomfort on occasion, but symptoms are significantly better.  She is on dual antiplatelet therapy and is tolerating this well.  She has gained a little bit of weight, is hoping to start being that more active and working on getting this off.  States her blood pressure has been fairly well controlled.  She continues to work on weight loss and is being followed by weight loss center.   Past Medical History:  Diagnosis Date  . Carpal tunnel syndrome of right wrist   . Coronary artery disease    per cath 11-29-2013  mild diffuse CAD and calcification in proximal LAD  .  Depression   . Diverticulosis   . GERD (gastroesophageal reflux disease)   . History of endometrial cancer 05/2003   FIGO, Grade 1  s/p  TAG w/ BSO  . HTN (hypertension)   . Hyperlipidemia   . OA (osteoarthritis)   . OSA on CPAP    followed by dr Elsworth Soho-- per study 03-26-2012  mod. to severe OSA , AHI 37/hr  . Type II diabetes mellitus ([place])    followed by pcp  . Urinary incontinence in female   . Vitamin D deficiency     Past Surgical History:  Procedure Laterality Date  . CARDIAC CATHETERIZATION  11/29/2013   dr Einar Gip   mild diffuse noncritial CAD and mild calcification involving the proximal LAD,  LVEF 55-60%  . CARPAL TUNNEL RELEASE Right 07/17/2017   Procedure: CARPAL TUNNEL RELEASE;  Surgeon: Dorna Leitz, MD;  Location: [place];  Service: Orthopedics;  Laterality: Right;  . CORONARY ATHERECTOMY N/A 01/18/2019   Procedure: CORONARY ATHERECTOMY;  Surgeon: Adrian Prows, MD;  Location: [place] [facility];  Service: Cardiovascular;  Laterality: N/A;  . CORONARY STENT INTERVENTION N/A 01/18/2019   Procedure: CORONARY STENT INTERVENTION;  Surgeon: Adrian Prows, MD;  Location: [place] [facility];  Service: Cardiovascular;  Laterality: N/A;  . KNEE ARTHROSCOPY Left 10/ 2004;  05-17-2008   dr graves  . KNEE ARTHROSCOPY Right 11/2012  . LAPAROSCOPIC CHOLECYSTECTOMY  11-19-2006  dr Zella Richer  [facility]  . LEFT HEART CATH AND CORONARY ANGIOGRAPHY N/A 01/18/2019  Procedure: LEFT HEART CATH AND CORONARY ANGIOGRAPHY;  Surgeon: Adrian Prows, MD;  Location: [place] [facility];  Service: Cardiovascular;  Laterality: N/A;  . LEFT HEART CATHETERIZATION WITH CORONARY ANGIOGRAM N/A 11/29/2013   Procedure: LEFT HEART CATHETERIZATION WITH CORONARY ANGIOGRAM;  Surgeon: Laverda Page, MD;  Location: [facility];  Service: Cardiovascular;  Laterality: N/A;  . TOTAL ABDOMINAL HYSTERECTOMY W/ BILATERAL SALPINGOOPHORECTOMY  06-20-2003    dr Mohammed Kindle  [facility]   endometrial cancer  . TOTAL HIP  ARTHROPLASTY Right 09/02/2013   Procedure: RIGHT TOTAL HIP ARTHROPLASTY ANTERIOR APPROACH;  Surgeon: Alta Corning, MD;  Location: [place];  Service: Orthopedics;  Laterality: Right;  . TOTAL KNEE ARTHROPLASTY Left 08/02/2012   Procedure: TOTAL KNEE ARTHROPLASTY;  Surgeon: Alta Corning, MD;  Location: [place];  Service: Orthopedics;  Laterality: Left;  left total knee arthroplasty  . TUBAL LIGATION  yrs ago    Social History   Socioeconomic History  . Marital status: Married    Spouse name: Maeleigh Provencal  . Number of children: 3  . Years of education: Not on file  . Highest education level: Not on file  Occupational History  . Occupation: housewife  Social Needs  . Financial resource strain: Not on file  . Food insecurity    Worry: Not on file    Inability: Not on file  . Transportation needs    Medical: Not on file    Non-medical: Not on file  Tobacco Use  . Smoking status: Never Smoker  . Smokeless tobacco: Never Used  Substance and Sexual Activity  . Alcohol use: No  . Drug use: No  . Sexual activity: Not on file  Lifestyle  . Physical activity    Days per week: Not on file    Minutes per session: Not on file  . Stress: Not on file  Relationships  . Social Herbalist on phone: Not on file    Gets together: Not on file    Attends religious service: Not on file    Active member of club or organization: Not on file    Attends meetings of clubs or organizations: Not on file    Relationship status: Not on file  . Intimate partner violence    Fear of current or ex partner: Not on file    Emotionally abused: Not on file    Physically abused: Not on file    Forced sexual activity: Not on file  Other Topics Concern  . Not on file  Social History Narrative  . Not on file    Review of Systems  Constitution: Positive for weight gain. Negative for decreased appetite, malaise/fatigue and weight loss.  Eyes: Negative for visual disturbance.  Cardiovascular: Positive  for chest pain and leg swelling (improved). Negative for claudication, dyspnea on exertion, orthopnea, palpitations and syncope.  Respiratory: Positive for shortness of breath (improved). Negative for hemoptysis and wheezing.   Endocrine: Negative for cold intolerance and heat intolerance.  Hematologic/Lymphatic: Does not bruise/bleed easily.  Skin: Negative for nail changes.  Musculoskeletal: Negative for muscle weakness and myalgias.  Gastrointestinal: Negative for abdominal pain, change in bowel habit, nausea and vomiting.  Neurological: Negative for difficulty with concentration, dizziness, focal weakness and headaches.  Psychiatric/Behavioral: Negative for altered mental status and suicidal ideas.  All other systems reviewed and are negative.     Objective:  Blood pressure 128/68, pulse 60, temperature (!) 97.5 F (36.4 C), height 5\' 4"  (1.626 m), weight  253 lb 11.2 oz (115.1 kg), last menstrual period 11/25/2002, SpO2 95 %. Body mass index is 43.55 kg/m.    Physical Exam  Constitutional: She is oriented to person, place, and time. Vital signs are normal. She appears well-developed and well-nourished.  HENT:  Head: Normocephalic and atraumatic.  Neck: Normal range of motion.  Cardiovascular: Normal rate, regular rhythm and intact distal pulses.  Murmur heard.  Low-pitched early systolic murmur is present with a grade of 1/6 at the upper right sternal border. Pulses:      Radial pulses are 2+ on the right side.       Femoral pulses are 1+ on the right side and 1+ on the left side.      Popliteal pulses are 1+ on the right side and 1+ on the left side.       Dorsalis pedis pulses are 2+ on the right side and 2+ on the left side.       Posterior tibial pulses are 2+ on the right side and 2+ on the left side.  No hematoma or evidence of infection to right radial access site.   1+ pitting edema bilateral  Pulses difficult to feel due to bodily habitus  Pulmonary/Chest: Effort  normal and breath sounds normal. No accessory muscle usage. No respiratory distress.  Abdominal: Soft. Bowel sounds are normal.  Musculoskeletal: Normal range of motion.  Neurological: She is alert and oriented to person, place, and time.  Skin: Skin is warm and dry.  Vitals reviewed.  Radiology: No results found.  Laboratory examination:    CMP Latest Ref Rng & Units 01/14/2019 11/17/2018 10/27/2018  Glucose 65 - 99 mg/dL 167(H) 147(H) 109(H)  BUN 8 - 27 mg/dL 25 34(H) 27  Creatinine 0.57 - 1.00 mg/dL 1.07(H) 0.92 0.88  Sodium 134 - 144 mmol/L 141 137 140  Potassium 3.5 - 5.2 mmol/L 5.2 4.8 4.2  Chloride 96 - 106 mmol/L 100 100 99  CO2 20 - 29 mmol/L 23 21 24   Calcium 8.7 - 10.3 mg/dL 10.1 9.9 9.5  Total Protein 6.0 - 8.5 g/dL - - 6.7  Total Bilirubin 0.0 - 1.2 mg/dL - - 0.3  Alkaline Phos 39 - 117 IU/L - - 53  AST 0 - 40 IU/L - - 18  ALT 0 - 32 IU/L - - 27   CBC Latest Ref Rng & Units 01/14/2019 07/09/2018 07/07/2018  WBC 3.4 - 10.8 x10E3/uL 7.9 15.7(H) 17.1(H)  Hemoglobin 11.1 - 15.9 g/dL 13.1 13.3 12.9  Hematocrit 34.0 - 46.6 % 39.6 41.4 38.7  Platelets 150 - 450 x10E3/uL 282 334 318   Lipid Panel     Component Value Date/Time   CHOL 214 (H) 10/27/2018 0945   TRIG 392 (H) 10/27/2018 0945   HDL 35 (L) 10/27/2018 0945   CHOLHDL 5.9 (H) 07/16/2015 1339   VLDL NOT CALC 07/16/2015 1339   LDLCALC 111 (H) 10/27/2018 0945   HEMOGLOBIN A1C Lab Results  Component Value Date   HGBA1C 7.4 (H) 10/27/2018   MPG 154 07/16/2015   TSH Recent Labs    10/27/18 0945  TSH 2.300    PRN Meds:. There are no discontinued medications. No outpatient medications have been marked as taking for the 01/28/19 encounter (Office Visit) with Miquel Dunn, NP.    Cardiac Studies:   Left heart catheterization 01/18/2019: Normal LV systolic function, normal LVEDP.  No pressure gradient across the aortic valve. RCA is dominant and has mild disease.  Circumflex coronary artery is  moderate-sized vessel with mild disease. LAD is a large-caliber vessel, with high-grade 95% proximal stenosis followed by tandem 70 to 80% stenosis.  Moderate amount of calcification evident. Successful orbital atherectomy followed by stenting with 3.5 x 30 mm resolute Onyx at 12 atmospheric pressure for 60 seconds followed by postdilatation with 3.5 x 18 mm Alderson balloon at 16 atmospheric pressure x2.  Stenosis reduced to 0% with TIMI-3 to TIMI-3 flow.  Recommendation: Patient will be discharged home today with outpatient follow-up.  She will need aspirin indefinitely and Plavix for at least a period of 6 months.  Omeprazole will be changed over to pantoprazole in view of interaction.  120 mL contrast utilized  The [employer] Stress Test 12/27/2018: 1.  Resting EKG normal sinus rhythm, stress EKG nondiagnostic due to pharmacologic stress. 2.  Left ventricle is dilated both in rest and stress images, LV end-diastolic volume 123456 mL. Perfusion images reveal a small sized mild to moderate amount of ischemia in the apical inferior and periapical region.  LVEF elevated at 47%.  This is an intermediate risk study. No previous exam available for comparison  Echocardiogram 11/16/2018:  Normal LV systolic function with EF 66%. Left ventricle cavity is normal in size. Moderate concentric hypertrophy of the left ventricle. Normal global wall motion. Doppler evidence of grade I (impaired) diastolic dysfunction, normal LAP. Calculated EF 66%. Left atrial cavity is moderately dilated at 4.5 cm. Right ventricle cavity is mildly dilated. Normal right ventricular function. Compared to the study done on 12/21/2013, no significant change.  Carotid Doppler [12/27/2013]: No evidence of hemodynamically significant stenosis in the bilateral carotid bifurcation vessels.  Assessment:   Coronary artery disease involving native coronary artery of native heart without angina pectoris - Plan: EKG 12-Lead  Essential  hypertension  Chronic diastolic (congestive) heart failure (HCC)  Mixed hyperlipidemia  Type 2 diabetes mellitus without complication, without long-term current use of insulin ([place])  EKG 11/02/2018: Sinus bradycardia at 54 bpm, normal axis, low-voltage complexes, no evidence of ischemia.  EKG 01/28/2019: Sinus bradycardia at 59 bpm, normal axis, T wave abnormality in anteroseptal leads, cannot exclude ischemia.    Recommendations:   Patient has had significant improvement in chest pain since her procedure and states that she is feeling well.  She is tolerating dual antiplatelet therapy, she is aware she will need to continue with Plavix for at least the next 1 year and aspirin indefinitely.  I have encouraged her to slowly start increasing her activity level and start regular exercising.  She will need to continue to work on her risk factors.  I reviewed her labs that were performed in September, her lipids are not at goal.  She is on high-dose Lipitor.  She would benefit from PCSK9 inhibitor given her CAD and recent event.  I will start her on Repatha.  She will come in next week for Repatha injection in our office.  She will need repeat lipids before her next office visit.  Blood pressure was initially elevated in our office, but on recheck had significantly improved, will continue to monitor.  No changes were made to her medications today.  No clinical evidence of decompensated heart failure.  I will see her back in 3 months for follow-up on CAD, hypertension, and hyperlipidemia.  Encouraged her to continue to work on her weight.  Miquel Dunn, MSN, APRN, FNP-C [facility]. [place] Office: [phone] Fax: [phone]

## 2019-01-28 ENCOUNTER — Other Ambulatory Visit: Payer: Self-pay

## 2019-01-28 ENCOUNTER — Encounter: Payer: Self-pay | Admitting: Cardiology

## 2019-01-28 ENCOUNTER — Ambulatory Visit (INDEPENDENT_AMBULATORY_CARE_PROVIDER_SITE_OTHER): Payer: Medicare Other | Admitting: Cardiology

## 2019-01-28 VITALS — BP 128/68 | HR 60 | Temp 97.5°F | Ht 64.0 in | Wt 253.7 lb

## 2019-01-28 DIAGNOSIS — I10 Essential (primary) hypertension: Secondary | ICD-10-CM

## 2019-01-28 DIAGNOSIS — I2510 Atherosclerotic heart disease of native coronary artery without angina pectoris: Secondary | ICD-10-CM

## 2019-01-28 DIAGNOSIS — I5032 Chronic diastolic (congestive) heart failure: Secondary | ICD-10-CM | POA: Diagnosis not present

## 2019-01-28 DIAGNOSIS — E782 Mixed hyperlipidemia: Secondary | ICD-10-CM

## 2019-01-28 DIAGNOSIS — E119 Type 2 diabetes mellitus without complications: Secondary | ICD-10-CM

## 2019-01-28 MED ORDER — REPATHA SURECLICK 140 MG/ML ~~LOC~~ SOAJ: 140.0000 mg | SUBCUTANEOUS | 2 refills | Status: DC

## 2019-02-02 ENCOUNTER — Ambulatory Visit: Payer: Medicare Other

## 2019-02-04 ENCOUNTER — Ambulatory Visit (INDEPENDENT_AMBULATORY_CARE_PROVIDER_SITE_OTHER): Payer: Medicare Other | Admitting: Cardiology

## 2019-02-04 ENCOUNTER — Other Ambulatory Visit: Payer: Self-pay

## 2019-02-04 DIAGNOSIS — E782 Mixed hyperlipidemia: Secondary | ICD-10-CM

## 2019-02-04 MED ORDER — EVOLOCUMAB 140 MG/ML ~~LOC~~ SOAJ
140.0000 mg | Freq: Once | SUBCUTANEOUS | Status: AC
  Administered 2019-02-04: 140 mg via SUBCUTANEOUS

## 2019-02-07 ENCOUNTER — Ambulatory Visit (INDEPENDENT_AMBULATORY_CARE_PROVIDER_SITE_OTHER): Payer: Self-pay | Admitting: Family Medicine

## 2019-02-10 NOTE — Progress Notes (Signed)
Patient here for repatha injection only. Tolerated well.

## 2019-02-10 NOTE — Telephone Encounter (Signed)
Please read; I have been working on this and they wont approve it even with an appeal but they say that it can change come first of the year

## 2019-02-14 ENCOUNTER — Other Ambulatory Visit: Payer: Self-pay

## 2019-02-14 ENCOUNTER — Emergency Department (HOSPITAL_BASED_OUTPATIENT_CLINIC_OR_DEPARTMENT_OTHER): Payer: Medicare Other

## 2019-02-14 ENCOUNTER — Encounter (HOSPITAL_BASED_OUTPATIENT_CLINIC_OR_DEPARTMENT_OTHER): Payer: Self-pay | Admitting: *Deleted

## 2019-02-14 ENCOUNTER — Emergency Department (HOSPITAL_BASED_OUTPATIENT_CLINIC_OR_DEPARTMENT_OTHER)
Admission: EM | Admit: 2019-02-14 | Discharge: 2019-02-14 | Disposition: A | Discharge status: Home / Self Care | Payer: Medicare Other | Attending: Emergency Medicine | Admitting: Emergency Medicine

## 2019-02-14 DIAGNOSIS — E782 Mixed hyperlipidemia: Secondary | ICD-10-CM | POA: Diagnosis not present

## 2019-02-14 DIAGNOSIS — Z7984 Long term (current) use of oral hypoglycemic drugs: Secondary | ICD-10-CM | POA: Insufficient documentation

## 2019-02-14 DIAGNOSIS — Z79899 Other long term (current) drug therapy: Secondary | ICD-10-CM | POA: Diagnosis not present

## 2019-02-14 DIAGNOSIS — W19XXXA Unspecified fall, initial encounter: Secondary | ICD-10-CM

## 2019-02-14 DIAGNOSIS — S0990XA Unspecified injury of head, initial encounter: Secondary | ICD-10-CM | POA: Diagnosis present

## 2019-02-14 DIAGNOSIS — Y9301 Activity, walking, marching and hiking: Secondary | ICD-10-CM | POA: Insufficient documentation

## 2019-02-14 DIAGNOSIS — Z7982 Long term (current) use of aspirin: Secondary | ICD-10-CM | POA: Diagnosis not present

## 2019-02-14 DIAGNOSIS — I10 Essential (primary) hypertension: Secondary | ICD-10-CM | POA: Insufficient documentation

## 2019-02-14 DIAGNOSIS — S0083XA Contusion of other part of head, initial encounter: Secondary | ICD-10-CM | POA: Diagnosis not present

## 2019-02-14 DIAGNOSIS — E119 Type 2 diabetes mellitus without complications: Secondary | ICD-10-CM | POA: Diagnosis not present

## 2019-02-14 DIAGNOSIS — I2510 Atherosclerotic heart disease of native coronary artery without angina pectoris: Secondary | ICD-10-CM | POA: Insufficient documentation

## 2019-02-14 DIAGNOSIS — Y999 Unspecified external cause status: Secondary | ICD-10-CM | POA: Insufficient documentation

## 2019-02-14 DIAGNOSIS — W108XXA Fall (on) (from) other stairs and steps, initial encounter: Secondary | ICD-10-CM | POA: Insufficient documentation

## 2019-02-14 DIAGNOSIS — Y92008 Other place in unspecified non-institutional (private) residence as the place of occurrence of the external cause: Secondary | ICD-10-CM | POA: Insufficient documentation

## 2019-02-14 DIAGNOSIS — M79641 Pain in right hand: Secondary | ICD-10-CM | POA: Insufficient documentation

## 2019-02-14 DIAGNOSIS — M79642 Pain in left hand: Secondary | ICD-10-CM | POA: Insufficient documentation

## 2019-02-14 MED ORDER — HYDROCODONE-ACETAMINOPHEN 5-325 MG PO TABS: 1.0000 | ORAL_TABLET | Freq: Four times a day (QID) | ORAL | 0 refills | Status: DC | PRN

## 2019-02-14 MED ORDER — HYDROCODONE-ACETAMINOPHEN 5-325 MG PO TABS
1.0000 | ORAL_TABLET | Freq: Once | ORAL | Status: AC
  Administered 2019-02-14: 1 via ORAL
  Filled 2019-02-14: qty 1

## 2019-02-14 NOTE — Discharge Instructions (Signed)
Work-up here tonight to include CT head neck and face.  Without any acute bony abnormalities or any brain injury.  X-rays of both hands without any bony injuries.  But she does have a lot of discomfort in the hand so recommending follow-up with sports medicine recommend that she call and make an appointment.  Take the hydrocodone as needed for pain.  Return for any new or worse symptoms.

## 2019-02-14 NOTE — ED Triage Notes (Addendum)
[place] about an hour ago  C/o pain and tingling bilateral hands states  face and head hit a door w abrasion to nose,  ? Loc  States rt side felt numb for approx 10 minutes

## 2019-02-14 NOTE — ED Provider Notes (Signed)
[facility] EMERGENCY DEPARTMENT Provider Note   CSN: [id number] Arrival date & time: 02/14/19  1657     History Chief Complaint  Patient presents with  . Fall    [name] [name] is a 68 y.o. female.  Patient with a fall about an hour prior to arrival.  Patient was indoors was headed up steps tripped lost her balance and went into the the stairs with her face and head and had her hands outstretched.  Patient with complaint of pain to both hands and also developed a nosebleed which is now stopped.  And has an abrasion on the left side of her nose.  Denies any loss of consciousness.  Patient is on Plavix but no other blood thinners.  Patient also states that for a brief period of time her right sided felt numb after the fall but that is all resolved.  Patient without any leg pain or hip pain or pelvic pain.  No back pain.  Questionable neck stiffness.        Past Medical History:  Diagnosis Date  . Carpal tunnel syndrome of right wrist   . Coronary artery disease    per cath 11-29-2013  mild diffuse CAD and calcification in proximal LAD  . Depression   . Diverticulosis   . GERD (gastroesophageal reflux disease)   . History of endometrial cancer 05/2003   FIGO, Grade 1  s/p  TAG w/ BSO  . HTN (hypertension)   . Hyperlipidemia   . OA (osteoarthritis)   . OSA on CPAP    followed by dr Elsworth Soho-- per study 03-26-2012  mod. to severe OSA , AHI 37/hr  . Type II diabetes mellitus ([place])    followed by pcp  . Urinary incontinence in female   . Vitamin D deficiency     Patient Active Problem List   Diagnosis Date Noted  . Coronary artery disease 01/18/2019  . Class 3 severe obesity with serious comorbidity and body mass index (BMI) of 40.0 to 44.9 in adult ([place]) 10/13/2018  . Chronic respiratory failure with hypoxia ([place]) 08/20/2018  . Acute respiratory failure with hypoxemia ([place])   . Hypoxemia   . Acute pulmonary edema (H[facility])   . Pneumonia due to COVID-19 virus  06/30/2018  . DM (diabetes mellitus), type 2 ([place]) 06/30/2018  . Essential hypertension 06/30/2018  . Right carpal tunnel syndrome 07/17/2017  . Other fatigue 04/29/2017  . Shortness of breath on exertion 04/29/2017  . Vitamin D deficiency 04/29/2017  . Hypersomnolence 06/04/2016  . Nonalcoholic fatty liver disease without nonalcoholic steatohepatitis ([facility]) 02/12/2016  . Gastroesophageal reflux disease with esophagitis 11/23/2014  . History of endometrial cancer 08/25/2014  . Mixed hyperlipidemia 08/25/2014  . Morbid obesity with alveolar hypoventilation ([place]) 08/25/2014  . Bilateral carotid bruits 11/30/2013  . Dyspnea on exertion 11/30/2013  . Angina pectoris ([place]) 11/29/2013  . Osteoarthritis of right hip 09/02/2013  . Cystocele 07/03/2013  . Vaginal pessary present 04/25/2013  . Prolapse of vaginal vault after hysterectomy 04/25/2013  . Endometrial cancer, grade I ([place]) 04/25/2013  . Unspecified vitamin D deficiency 04/25/2013  . Personal history of other medical treatment 04/25/2013  . Osteoarthritis of left knee 08/02/2012  . Stress incontinence 06/07/2012  . Essential hypertension with goal blood pressure less than 130/85 06/07/2012  . Diabetes ([place]) 06/07/2012  . Obstructive sleep apnea 02/16/2012    Past Surgical History:  Procedure Laterality Date  . CARDIAC CATHETERIZATION  11/29/2013   dr Einar Gip   mild diffuse noncritial  CAD and mild calcification involving the proximal LAD,  LVEF 55-60%  . CARPAL TUNNEL RELEASE Right 07/17/2017   Procedure: CARPAL TUNNEL RELEASE;  Surgeon: Dorna Leitz, MD;  Location: [facility];  Service: Orthopedics;  Laterality: Right;  . CORONARY ATHERECTOMY N/A 01/18/2019   Procedure: CORONARY ATHERECTOMY;  Surgeon: Adrian Prows, MD;  Location: [facility];  Service: Cardiovascular;  Laterality: N/A;  . CORONARY STENT INTERVENTION N/A 01/18/2019   Procedure: CORONARY STENT INTERVENTION;  Surgeon: Adrian Prows, MD;  Location:  [facility];  Service: Cardiovascular;  Laterality: N/A;  . KNEE ARTHROSCOPY Left 10/ 2004;  05-17-2008   dr graves  . KNEE ARTHROSCOPY Right 11/2012  . LAPAROSCOPIC CHOLECYSTECTOMY  11-19-2006  dr Zella Richer  [facility]  . LEFT HEART CATH AND CORONARY ANGIOGRAPHY N/A 01/18/2019   Procedure: LEFT HEART CATH AND CORONARY ANGIOGRAPHY;  Surgeon: Adrian Prows, MD;  Location: [facility];  Service: Cardiovascular;  Laterality: N/A;  . LEFT HEART CATHETERIZATION WITH CORONARY ANGIOGRAM N/A 11/29/2013   Procedure: LEFT HEART CATHETERIZATION WITH CORONARY ANGIOGRAM;  Surgeon: Laverda Page, MD;  Location: [facility] CATH LAB;  Service: Cardiovascular;  Laterality: N/A;  . TOTAL ABDOMINAL HYSTERECTOMY W/ BILATERAL SALPINGOOPHORECTOMY  06-20-2003    dr Mohammed Kindle  [facility]   endometrial cancer  . TOTAL HIP ARTHROPLASTY Right 09/02/2013   Procedure: RIGHT TOTAL HIP ARTHROPLASTY ANTERIOR APPROACH;  Surgeon: Alta Corning, MD;  Location: [facility];  Service: Orthopedics;  Laterality: Right;  . TOTAL KNEE ARTHROPLASTY Left 08/02/2012   Procedure: TOTAL KNEE ARTHROPLASTY;  Surgeon: Alta Corning, MD;  Location: [facility];  Service: Orthopedics;  Laterality: Left;  left total knee arthroplasty  . TUBAL LIGATION  yrs ago     OB History    Gravida  3   Para  3   Term  3   Preterm  0   AB  0   Living  3     SAB  0   TAB  0   Ectopic  0   Multiple  0   Live Births  3           Family History  Problem Relation Age of Onset  . Heart disease Father 30       CABG  . Lung cancer Father 11       lung  . Hypertension Father   . Diabetes Father   . High Cholesterol Father   . Diabetes type II Father   . Heart disease Sister 34       CABG  . Diabetes Sister 62  . Heart disease Brother 73       MI with stents  . Hypertension Mother   . Diabetes Mother   . High Cholesterol Mother   . Sudden death Mother   . Thyroid disease Mother   . Obesity Mother   . Hypertension Sister        x2   . Diabetes  Sister   . Hypertension Brother   . Diabetes Brother   . Cancer Sister        high grade Neuroendocrine carcinoma right axilla  . Pancreatic cancer Other        x 3 - 2 MAunts and 1 P Aunts.   . Lupus Other        Pat Aunt    Social History   Tobacco Use  . Smoking status: Never Smoker  . Smokeless tobacco: Never Used  Substance Use  Topics  . Alcohol use: No  . Drug use: No    Home Medications Prior to Admission medications   Medication Sig Start Date End Date Taking? Authorizing Provider  ACCU-CHEK AVIVA PLUS test strip U UTD 06/12/17   [provider]  ACCU-CHEK SOFTCLIX LANCETS lancets U UTD 06/12/17   [provider]  amLODipine (NORVASC) 10 MG tablet Take 1 tablet (10 mg total) by mouth daily. Patient taking differently: Take 10 mg by mouth every morning.  11/30/13   Adrian Prows, MD  aspirin EC 81 MG tablet Take 81 mg by mouth daily.    [provider]  atorvastatin (LIPITOR) 80 MG tablet Take 80 mg by mouth every evening.  04/11/15   [provider]  benazepril (LOTENSIN) 40 MG tablet Take 40 mg by mouth every evening.     [provider]  bisoprolol (ZEBETA) 10 MG tablet TAKE 1 TABLET BY MOUTH EVERY DAY 10/15/18   Miquel Dunn, NP  cetirizine (ZYRTEC) 10 MG tablet Take 10 mg by mouth at bedtime.     [provider]  chlorthalidone (HYGROTON) 25 MG tablet Take 50 mg by mouth every morning. 2 table once daily 12/30/13   [provider]  clopidogrel (PLAVIX) 75 MG tablet Take 1 tablet (75 mg total) by mouth daily. 01/18/19   Adrian Prows, MD  escitalopram (LEXAPRO) 20 MG tablet Take 20 mg by mouth every evening.     [provider]  Evolocumab (REPATHA SURECLICK) [id number] MG/ML SOAJ Inject 140 mg into the skin every 14 (fourteen) days. 01/28/19   Miquel Dunn, NP  glimepiride (AMARYL) 4 MG tablet Take 2 mg by mouth 2 (two) times daily. 1/2 tablet daily 02/14/18   [provider]    HYDROcodone-acetaminophen (NORCO/VICODIN) 5-325 MG tablet Take 1 tablet by mouth every 6 (six) hours as needed for moderate pain. 02/14/19   Fredia Sorrow, MD  linagliptin (TRADJENTA) 5 MG TABS tablet Take 1 tablet (5 mg total) by mouth every morning. 02/01/18   Abby Potash, PA-C  metFORMIN (GLUCOPHAGE) 1000 MG tablet Take 1 tablet (1,000 mg total) by mouth 2 (two) times daily with a meal. 01/20/19   Adrian Prows, MD  Misc Natural Products (LUTEIN VISION BLEND PO) Take 1 capsule by mouth daily.    [provider]  Multiple Vitamin (MULTIVITAMIN) tablet Take 1 tablet by mouth daily.    [provider]  nitroGLYCERIN (NITROSTAT) 0.4 MG SL tablet Place 1 tablet (0.4 mg total) under the tongue every 5 (five) minutes as needed for chest pain. 01/18/19 01/18/20  Adrian Prows, MD  Omega-3 Fatty Acids (FISH OIL) 1200 MG CAPS Take 1,200 mg by mouth daily.    [provider]  pantoprazole (PROTONIX) 40 MG tablet Take 1 tablet (40 mg total) by mouth daily before breakfast. 01/18/19   Adrian Prows, MD  spironolactone (ALDACTONE) 25 MG tablet Take 1 tablet (25 mg total) by mouth daily. 01/03/19   Miquel Dunn, NP  Vitamin D, Ergocalciferol, (DRISDOL) 1.25 MG (50000 UT) CAPS capsule Take 1 capsule (50,000 Units total) by mouth once a week. 01/24/19   [place], Joneen Boers, FNP    Allergies    Codeine  Review of Systems   Review of Systems  Constitutional: Negative for chills and fever.  HENT: Positive for nosebleeds. Negative for congestion, rhinorrhea, sore throat and trouble swallowing.   Eyes: Negative for pain and visual disturbance.  Respiratory: Negative for cough and shortness of breath.   Cardiovascular:  Negative for chest pain and leg swelling.  Gastrointestinal: Negative for abdominal pain, diarrhea, nausea and vomiting.  Genitourinary: Negative for dysuria.  Musculoskeletal: Positive for back pain and neck pain.  Skin: Positive for wound. Negative for rash.   Neurological: Positive for numbness. Negative for dizziness, syncope, speech difficulty, weakness, light-headedness and headaches.  Hematological: Does not bruise/bleed easily.  Psychiatric/Behavioral: Negative for confusion.    Physical Exam Updated Vital Signs BP (!) 164/63 (BP Location: Right Arm)   Pulse 63   Temp 98.9 F (37.2 C) (Oral)   Resp 19   Ht 1.626 m (5\' 4" )   Wt 112.5 kg   LMP 11/25/2002 (Approximate)   SpO2 94%   BMI 42.57 kg/m   Physical Exam Vitals and nursing note reviewed.  Constitutional:      General: She is not in acute distress.    Appearance: Normal appearance. She is well-developed.  HENT:     Head: Normocephalic.     Comments: Abrasion about 5 mm to the left side of the nose.  Both external nares with some dried blood indicating that there was nosebleed.  But no active bleeding now.  Contusion to the right forehead area.  No active bleeding just some slight bruising.    Mouth/Throat:     Pharynx: Oropharynx is clear.     Comments: No blood in the back of the throat. Eyes:     Extraocular Movements: Extraocular movements intact.     Conjunctiva/sclera: Conjunctivae normal.     Pupils: Pupils are equal, round, and reactive to light.     Comments: No evidence of any entrapment no contusion below eyes.  Cardiovascular:     Rate and Rhythm: Normal rate and regular rhythm.     Heart sounds: No murmur.  Pulmonary:     Effort: Pulmonary effort is normal. No respiratory distress.     Breath sounds: Normal breath sounds.  Abdominal:     Palpations: Abdomen is soft.     Tenderness: There is no abdominal tenderness.  Musculoskeletal:        General: Tenderness present. Normal range of motion.     Cervical back: Normal range of motion and neck supple.     Comments: Bilateral hand pain.  No snuffbox tenderness.  No tenderness to elbow or shoulder.  No abrasions no bleeding radial pulse 2+ bilaterally good cap refill to the fingers good range of motion of  the fingers.  No real tenderness elicited with range of motion of the fingers.  No deformity.  Skin:    General: Skin is warm and dry.     Capillary Refill: Capillary refill takes less than 2 seconds.  Neurological:     General: No focal deficit present.     Mental Status: She is alert and oriented to person, place, and time.     ED Results / Procedures / Treatments   Labs (all labs ordered are listed, but only abnormal results are displayed) Labs Reviewed - No data to display  EKG None  Radiology DG Hand Complete Left  Result Date: 02/14/2019 CLINICAL DATA:  Fall and pain EXAM: LEFT HAND - COMPLETE 3+ VIEW COMPARISON:  None. FINDINGS: There is no evidence of fracture or dislocation. Advanced first [place] and interphalangeal joint osteoarthritis is seen with joint space loss and marginal osteophyte formation. There is also osteoarthritis of the D IP and PIP joints with joint space loss and marginal osteophyte formation. No focal soft tissue swelling. IMPRESSION: No acute osseous abnormality. Electronically  Signed   By: Prudencio Pair M.D.   On: 02/14/2019 19:17   DG Hand Complete Right  Result Date: 02/14/2019 CLINICAL DATA:  Fall bilateral hand pain height EXAM: RIGHT HAND - COMPLETE 3+ VIEW COMPARISON:  None. FINDINGS: No fracture or dislocation. There is advanced first CMC and interphalangeal joint osteoarthritis. There is also osteoarthritis at the PIP edema right the joints with joint space loss and marginal osteophyte formation. There is diffuse osteopenia. IMPRESSION: No acute osseous abnormality. Electronically Signed   By: Prudencio Pair M.D.   On: 02/14/2019 19:19    Procedures Procedures (including critical care time)  Medications Ordered in ED Medications  HYDROcodone-acetaminophen (NORCO/VICODIN) 5-325 MG per tablet 1 tablet (has no administration in time range)    ED Course  I have reviewed the triage vital signs and the nursing notes.  Pertinent labs & imaging results  that were available during my care of the patient were reviewed by me and considered in my medical decision making (see chart for details).    MDM Rules/Calculators/A&P                       Patient status post fall.  Did hit head and face did have a nosebleed which stopped spontaneously.  Main complaint is bilateral hand pain.  X-rays of both hands without any bony injury.  CT head neck and face without any acute bony injury or brain injury.  Does have a small abrasion to the left side of the nose.  CT did show some soft tissue swelling.  Patient was treated with hydrocodone for pain she is had that in the past without problems she has an allergy just to codeine.  And we will have her follow-up with sports medicine for the bilateral hand pain.  No other significant injuries.   Final Clinical Impression(s) / ED Diagnoses Final diagnoses:  Fall, initial encounter  Contusion of face, initial encounter  Injury of head, initial encounter  Bilateral hand pain    Rx / DC Orders ED Discharge Orders         Ordered    HYDROcodone-acetaminophen (NORCO/VICODIN) 5-325 MG tablet  Every 6 hours PRN     02/14/19 2137           Fredia Sorrow, MD 02/14/19 2144

## 2019-02-21 ENCOUNTER — Ambulatory Visit: Payer: Medicare Other | Admitting: Adult Health

## 2019-02-23 ENCOUNTER — Ambulatory Visit: Payer: Medicare Other | Admitting: Adult Health

## 2019-03-03 IMAGING — NM NM BONE 3 PHASE
10 series · 20 of 20 positions shown · non-contrast
Comparison: None.

CLINICAL DATA: Evaluate for loosening of left total knee
arthroplasty.

EXAM:
NUCLEAR MEDICINE 3-PHASE BONE SCAN
TECHNIQUE: Radionuclide angiographic images, immediate static blood pool
images, and 3-hour delayed static images were obtained of the knees
after intravenous injection of radiopharmaceutical.
RADIOPHARMACEUTICALS:  21.2 mCi 7c-BBm MDP IV

[Series 1: flow · 2.07mm/px · 6 of 48 frames shown (1 of 2)]
[frame 5/48]
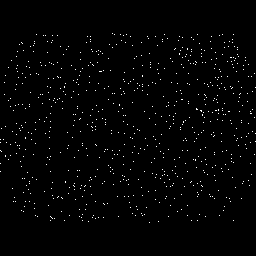
[frame 13/48  full-range]
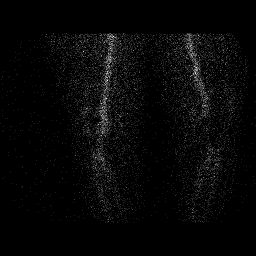
[frame 21/48  full-range]
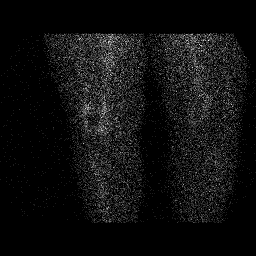
[frame 29/48  full-range]
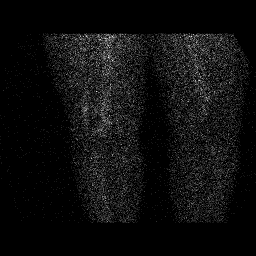
[frame 37/48  full-range]
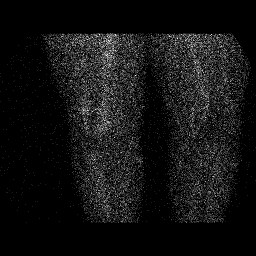
[frame 45/48  full-range]
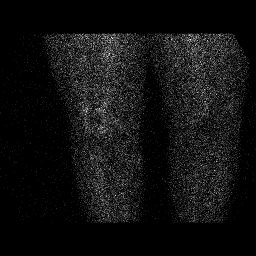

[Series 1: flow · 2.07mm/px · 6 of 48 frames shown (2 of 2)]
[frame 5/48]
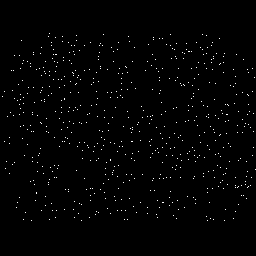
[frame 13/48]
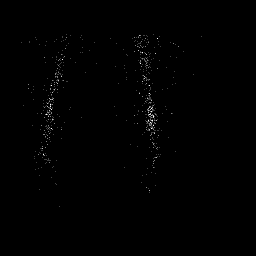
[frame 21/48  full-range]
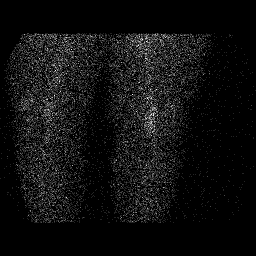
[frame 29/48  full-range]
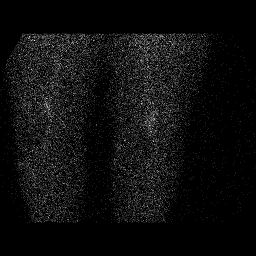
[frame 37/48  full-range]
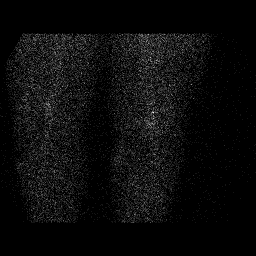
[frame 45/48  full-range]
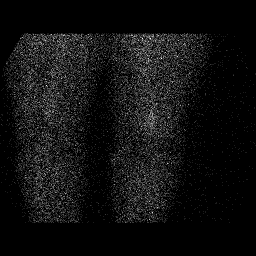

[Series 2: blood pool · 2.07mm/px · 1 of 1 slices shown (1 of 6)]
[im 1/1  full-range]
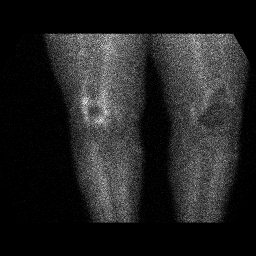

[Series 2: blood pool · 2.07mm/px · 1 of 1 slices shown (2 of 6)]
[im 1/1  full-range]
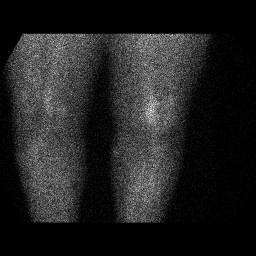

[Series 3: lat bp · 2.07mm/px · 1 of 1 slices shown (1 of 2)]
[im 1/1  full-range]
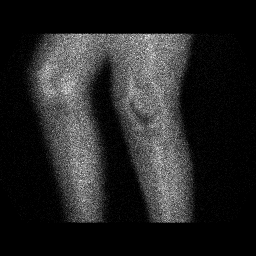

[Series 3: lat bp · 2.07mm/px · 1 of 1 slices shown (2 of 2)]
[im 1/1  full-range]
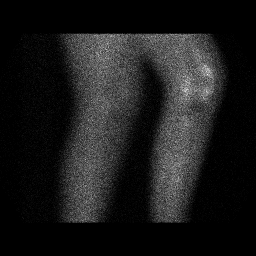

[Series 4: blood pool · 2.07mm/px · 1 of 1 slices shown (3 of 6)]
[im 1/1  full-range]
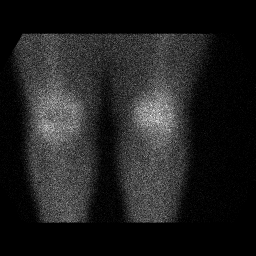

[Series 4: blood pool · 2.07mm/px · 1 of 1 slices shown (4 of 6)]
[im 1/1]
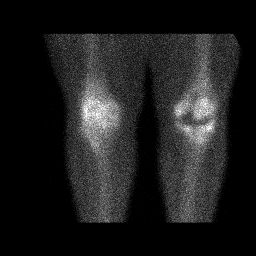

[Series 5: blood pool · 2.07mm/px · 1 of 1 slices shown (5 of 6)]
[im 1/1]
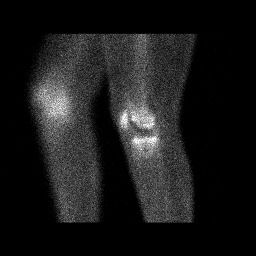

[Series 5: blood pool · 2.07mm/px · 1 of 1 slices shown (6 of 6)]
[im 1/1]
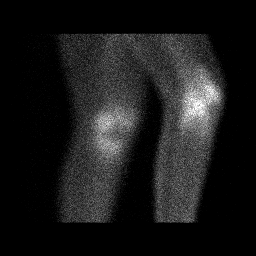

[20 of 20 positions shown; findings below may reference images not displayed]

FINDINGS: Vascular phase: Normal bilaterally.

Blood pool phase: Nose abnormal blood pool uptake on the left. There
is mild diffuse increased blood pool uptake on the right.

Delayed phase: There is diffuse increased delayed uptake on the
right. There is uptake adjacent to the femoral and tibial components
on the left. This uptake is relatively diffuse with a few small
regions of more focal uptake. However, the level of uptake on the
left is less than typically seen with loosening.
IMPRESSION: 1. Degenerative changes in the right knee.
2. No convincing evidence of total knee arthroplasty loosening on
the left. The level of uptake is within normal limits.

## 2019-03-24 ENCOUNTER — Other Ambulatory Visit: Payer: Self-pay

## 2019-03-24 ENCOUNTER — Other Ambulatory Visit: Payer: Self-pay | Admitting: General Surgery

## 2019-03-24 ENCOUNTER — Ambulatory Visit (INDEPENDENT_AMBULATORY_CARE_PROVIDER_SITE_OTHER): Payer: Medicare Other | Admitting: Pulmonary Disease

## 2019-03-24 ENCOUNTER — Encounter: Payer: Self-pay | Admitting: Pulmonary Disease

## 2019-03-24 DIAGNOSIS — G4733 Obstructive sleep apnea (adult) (pediatric): Secondary | ICD-10-CM | POA: Diagnosis not present

## 2019-03-24 DIAGNOSIS — I10 Essential (primary) hypertension: Secondary | ICD-10-CM | POA: Diagnosis not present

## 2019-03-24 NOTE — Assessment & Plan Note (Signed)
  Chronic cough may be related to benazepril - she will d/w cards at her next visit

## 2019-03-24 NOTE — Progress Notes (Signed)
   Subjective:    Patient ID: [name] [name], female    DOB: 08-24-1950, 69 y.o.   MRN: [id number]  HPI  69yo diabetic, hypertensive followed for obstructive sleep apnea  COVID-19 pneumonia with acute respiratory failure in May 2020 12/2018 cardiac stent  She has recovered from Covid pneumonia but continues to complain of "her brain being in a fog" she had a fall 3 weeks ago due to imbalance and hurt her left wrist and had a black eye.  She complains of intermittent right-sided chest pain which has been ongoing for more than a year Chest x-ray 10/22/2018 was reviewed which shows complete clearing of infiltrates.  She reports occasional dry, deep cough especially in the morning and after supper, med review shows benazepril 40 mg  CPAP is working well  Significant tests/ events  NPSG 02/2012: AHI 37/hr, desat to 78% Auto 2014: Optimal pressure 16cm. 12/2012: Decrease cpap to 14cm (10 pound weight loss).  05/2016 >>PAP titration  16/12 cm H2O  Review of Systems neg for any significant sore throat, dysphagia, itching, sneezing, nasal congestion or excess/ purulent secretions, fever, chills, sweats, unintended wt loss, pleuritic or exertional cp, hempoptysis, orthopnea pnd or change in chronic leg swelling. Also denies presyncope, palpitations, heartburn, abdominal pain, nausea, vomiting, diarrhea or change in bowel or urinary habits, dysuria,hematuria, rash, arthralgias, visual complaints, headache, numbness weakness or ataxia.     Objective:   Physical Exam   Gen. Pleasant, obese, in no distress ENT - no lesions, no post nasal drip Neck: No JVD, no thyromegaly, no carotid bruits Lungs: no use of accessory muscles, no dullness to percussion, decreased without rales or rhonchi  Cardiovascular: Rhythm regular, heart sounds  normal, no murmurs or gallops, no peripheral edema Musculoskeletal: No deformities, no cyanosis or clubbing , no tremors , no chest wall  tenderness        Assessment & Plan:

## 2019-03-24 NOTE — Assessment & Plan Note (Signed)
CPAP download report was reviewed which shows excellent control of events on 16 cm, great compliance more than 8 hours per night and minimal leak. Her main complaint is dryness and this may be related to high pressure. She has settled down with a full facemask. We will change to auto settings 12 to 16 cm and hopefully this will decrease the average pressure required and help with her dryness OSA appears to be treated so doubt this is contributing to her 'brain fog'  Weight loss encouraged, compliance with goal of at least 4-6 hrs every night is the expectation. Advised against medications with sedative side effects Cautioned against driving when sleepy - understanding that sleepiness will vary on a day to day basis

## 2019-03-24 NOTE — Patient Instructions (Signed)
Change to auto CPAP settings 12 to 16 cm

## 2019-04-04 ENCOUNTER — Ambulatory Visit: Payer: Medicare Other | Attending: Internal Medicine

## 2019-04-04 DIAGNOSIS — Z23 Encounter for immunization: Secondary | ICD-10-CM

## 2019-04-04 NOTE — Progress Notes (Signed)
   Covid-19 Vaccination Clinic  Name:  [name] [name]    MRN: [id number] DOB: 1950/04/06  04/04/2019  [name] [name] was observed post Covid-19 immunization for 15 minutes without incidence. She was provided with Vaccine Information Sheet and instruction to access the V-Safe system.   [name] [name] was instructed to call 911 with any severe reactions post vaccine: Marland Kitchen Difficulty breathing  . Swelling of your face and throat  . A fast heartbeat  . A bad rash all over your body  . Dizziness and weakness    Immunizations Administered    Name Date Dose VIS Date Route   Pfizer COVID-19 Vaccine 04/04/2019  5:32 PM 0.3 mL 02/04/2019 Intramuscular   Manufacturer: [place]   Lot: [id number]   [place]: [id number]

## 2019-04-09 ENCOUNTER — Other Ambulatory Visit: Payer: Self-pay | Admitting: Cardiology

## 2019-04-29 ENCOUNTER — Other Ambulatory Visit: Payer: Self-pay | Admitting: Cardiology

## 2019-04-29 ENCOUNTER — Ambulatory Visit: Payer: Medicare Other | Admitting: Cardiology

## 2019-04-30 ENCOUNTER — Ambulatory Visit: Payer: Medicare Other | Attending: Internal Medicine

## 2019-04-30 DIAGNOSIS — Z23 Encounter for immunization: Secondary | ICD-10-CM

## 2019-04-30 NOTE — Progress Notes (Signed)
   Covid-19 Vaccination Clinic  Name:  YUKI GOERTZEN    MRN: [id number] DOB: 01-31-1951  04/30/2019  Ms. Shone was observed post Covid-19 immunization for 15 minutes without incident. She was provided with Vaccine Information Sheet and instruction to access the V-Safe system.   Ms. Jalloh was instructed to call 911 with any severe reactions post vaccine: Marland Kitchen Difficulty breathing  . Swelling of face and throat  . A fast heartbeat  . A bad rash all over body  . Dizziness and weakness   Immunizations Administered    Name Date Dose VIS Date Route   Pfizer COVID-19 Vaccine 04/30/2019  9:47 AM 0.3 mL 02/04/2019 Intramuscular   Manufacturer: [place]   Lot: [id number]   [place]: [id number]

## 2019-05-03 ENCOUNTER — Encounter: Payer: Self-pay | Admitting: Cardiology

## 2019-05-03 ENCOUNTER — Ambulatory Visit: Payer: Medicare Other | Admitting: Cardiology

## 2019-05-03 ENCOUNTER — Other Ambulatory Visit: Payer: Self-pay

## 2019-05-03 VITALS — BP 127/66 | HR 65 | Temp 97.8°F | Resp 16 | Ht 63.0 in | Wt 255.0 lb

## 2019-05-03 DIAGNOSIS — T464X5A Adverse effect of angiotensin-converting-enzyme inhibitors, initial encounter: Secondary | ICD-10-CM

## 2019-05-03 DIAGNOSIS — I10 Essential (primary) hypertension: Secondary | ICD-10-CM

## 2019-05-03 DIAGNOSIS — I2510 Atherosclerotic heart disease of native coronary artery without angina pectoris: Secondary | ICD-10-CM

## 2019-05-03 DIAGNOSIS — R05 Cough: Secondary | ICD-10-CM

## 2019-05-03 DIAGNOSIS — E782 Mixed hyperlipidemia: Secondary | ICD-10-CM

## 2019-05-03 MED ORDER — LOSARTAN POTASSIUM 50 MG PO TABS: 50.0000 mg | ORAL_TABLET | Freq: Every day | ORAL | 3 refills | Status: DC

## 2019-05-03 NOTE — Progress Notes (Signed)
Primary Physician:  Vicenta Aly, FNP   Patient ID: [name] [name], female    DOB: 1950-05-21, 69 y.o.   MRN: [id number]  Subjective:    Chief Complaint  Patient presents with  . Hypertension  . Coronary Artery Disease    follow up    HPI: [name] [name]  is a 69 y.o. female  with diabetes mellitus which is recently uncontrolled, morbid obesity with obstructive sleep apnea recently established and is presently on CPAP since 2018, mixed hyperlipidemia, chronic palpitations with history of PACs and PVCs that have remained stable, and CAD s/p stent to LAD on 01/18/19. She now presents for 3 month follow up.  Patient states that she feels that she has in a long time.  No recurrence of exertional chest pain or shortness of breath.  Tolerating medications well.  After her last office visit, she was started on Repatha for uncontrolled hyperlipidemia.  She is tolerating her injections well.  Has not had recent labs.  States her blood pressure has been well controlled.  She continues to struggle with weight loss.   Past Medical History:  Diagnosis Date  . Carpal tunnel syndrome of right wrist   . Coronary artery disease    per cath 11-29-2013  mild diffuse CAD and calcification in proximal LAD  . Depression   . Diverticulosis   . GERD (gastroesophageal reflux disease)   . History of endometrial cancer 05/2003   FIGO, Grade 1  s/p  TAG w/ BSO  . HTN (hypertension)   . Hyperlipidemia   . OA (osteoarthritis)   . OSA on CPAP    followed by dr Elsworth Soho-- per study 03-26-2012  mod. to severe OSA , AHI 37/hr  . Type II diabetes mellitus ([place])    followed by pcp  . Urinary incontinence in female   . Vitamin D deficiency     Past Surgical History:  Procedure Laterality Date  . CARDIAC CATHETERIZATION  11/29/2013   dr Einar Gip   mild diffuse noncritial CAD and mild calcification involving the proximal LAD,  LVEF 55-60%  . CARPAL TUNNEL RELEASE Right 07/17/2017   Procedure: CARPAL TUNNEL  RELEASE;  Surgeon: Dorna Leitz, MD;  Location: [place];  Service: Orthopedics;  Laterality: Right;  . CORONARY ATHERECTOMY N/A 01/18/2019   Procedure: CORONARY ATHERECTOMY;  Surgeon: Adrian Prows, MD;  Location: [place] [facility];  Service: Cardiovascular;  Laterality: N/A;  . CORONARY STENT INTERVENTION N/A 01/18/2019   Procedure: CORONARY STENT INTERVENTION;  Surgeon: Adrian Prows, MD;  Location: [place] [facility];  Service: Cardiovascular;  Laterality: N/A;  . KNEE ARTHROSCOPY Left 10/ 2004;  05-17-2008   dr graves  . KNEE ARTHROSCOPY Right 11/2012  . LAPAROSCOPIC CHOLECYSTECTOMY  11-19-2006  dr Zella Richer  [facility]  . LEFT HEART CATH AND CORONARY ANGIOGRAPHY N/A 01/18/2019   Procedure: LEFT HEART CATH AND CORONARY ANGIOGRAPHY;  Surgeon: Adrian Prows, MD;  Location: [place] [facility];  Service: Cardiovascular;  Laterality: N/A;  . LEFT HEART CATHETERIZATION WITH CORONARY ANGIOGRAM N/A 11/29/2013   Procedure: LEFT HEART CATHETERIZATION WITH CORONARY ANGIOGRAM;  Surgeon: Laverda Page, MD;  Location: [facility];  Service: Cardiovascular;  Laterality: N/A;  . TOTAL ABDOMINAL HYSTERECTOMY W/ BILATERAL SALPINGOOPHORECTOMY  06-20-2003    dr Mohammed Kindle  [facility]   endometrial cancer  . TOTAL HIP ARTHROPLASTY Right 09/02/2013   Procedure: RIGHT TOTAL HIP ARTHROPLASTY ANTERIOR APPROACH;  Surgeon: Alta Corning, MD;  Location: [place];  Service: Orthopedics;  Laterality: Right;  . TOTAL KNEE ARTHROPLASTY Left 08/02/2012   Procedure: TOTAL KNEE ARTHROPLASTY;  Surgeon: Alta Corning, MD;  Location: [place];  Service: Orthopedics;  Laterality: Left;  left total knee arthroplasty  . TUBAL LIGATION  yrs ago   Social History   Tobacco Use  . Smoking status: Never Smoker  . Smokeless tobacco: Never Used  Substance Use Topics  . Alcohol use: No     Review of Systems  Cardiovascular: Negative for chest pain, claudication, dyspnea on exertion, leg swelling, orthopnea, palpitations and syncope.    Hematologic/Lymphatic: Negative for bleeding problem.  Neurological: Negative for dizziness, focal weakness and headaches.  All other systems reviewed and are negative.     Objective:  Blood pressure 127/66, pulse 65, temperature 97.8 F (36.6 C), temperature source Temporal, resp. rate 16, height 5\' 3"  (1.6 m), weight 255 lb (115.7 kg), last menstrual period 11/25/2002, SpO2 97 %. Body mass index is 45.17 kg/m.    Physical Exam  Constitutional: She is oriented to person, place, and time. Vital signs are normal. She appears well-developed and well-nourished.  HENT:  Head: Normocephalic and atraumatic.  Cardiovascular: Normal rate, regular rhythm and intact distal pulses.  Murmur heard.  Low-pitched early systolic murmur is present with a grade of 1/6 at the upper right sternal border. Pulses:      Radial pulses are 2+ on the right side.       Femoral pulses are 1+ on the right side and 1+ on the left side.      Popliteal pulses are 1+ on the right side and 1+ on the left side.       Dorsalis pedis pulses are 2+ on the right side and 2+ on the left side.       Posterior tibial pulses are 2+ on the right side and 2+ on the left side.  No hematoma or evidence of infection to right radial access site.   1+ pitting edema bilateral  Pulses difficult to feel due to bodily habitus  Pulmonary/Chest: Effort normal and breath sounds normal. No accessory muscle usage. No respiratory distress.  Abdominal: Soft. Bowel sounds are normal.  Musculoskeletal:        General: Normal range of motion.     Cervical back: Normal range of motion.  Neurological: She is alert and oriented to person, place, and time.  Skin: Skin is warm and dry.  Vitals reviewed.  Radiology: No results found.  Laboratory examination:    CMP Latest Ref Rng & Units 01/14/2019 11/17/2018 10/27/2018  Glucose 65 - 99 mg/dL 167(H) 147(H) 109(H)  BUN 8 - 27 mg/dL 25 34(H) 27  Creatinine 0.57 - 1.00 mg/dL 1.07(H) 0.92 0.88   Sodium 134 - 144 mmol/L 141 137 140  Potassium 3.5 - 5.2 mmol/L 5.2 4.8 4.2  Chloride 96 - 106 mmol/L 100 100 99  CO2 20 - 29 mmol/L 23 21 24   Calcium 8.7 - 10.3 mg/dL 10.1 9.9 9.5  Total Protein 6.0 - 8.5 g/dL - - 6.7  Total Bilirubin 0.0 - 1.2 mg/dL - - 0.3  Alkaline Phos 39 - 117 IU/L - - 53  AST 0 - 40 IU/L - - 18  ALT 0 - 32 IU/L - - 27   CBC Latest Ref Rng & Units 01/14/2019 07/09/2018 07/07/2018  WBC 3.4 - 10.8 x10E3/uL 7.9 15.7(H) 17.1(H)  Hemoglobin 11.1 - 15.9 g/dL 13.1 13.3 12.9  Hematocrit 34.0 - 46.6 % 39.6 41.4 38.7  Platelets 150 -  450 x10E3/uL 282 334 318   Lipid Panel     Component Value Date/Time   CHOL 214 (H) 10/27/2018 0945   TRIG 392 (H) 10/27/2018 0945   HDL 35 (L) 10/27/2018 0945   CHOLHDL 5.9 (H) 07/16/2015 1339   VLDL NOT CALC 07/16/2015 1339   LDLCALC 111 (H) 10/27/2018 0945   HEMOGLOBIN A1C Lab Results  Component Value Date   HGBA1C 7.4 (H) 10/27/2018   MPG 154 07/16/2015   TSH Recent Labs    10/27/18 0945  TSH 2.300    PRN Meds:. Medications Discontinued During This Encounter  Medication Reason  . benazepril (LOTENSIN) 40 MG tablet Discontinued by provider   Current Meds  Medication Sig  . ACCU-CHEK AVIVA PLUS test strip U UTD  . ACCU-CHEK SOFTCLIX LANCETS lancets U UTD  . amLODipine (NORVASC) 10 MG tablet Take 1 tablet (10 mg total) by mouth daily. (Patient taking differently: Take 10 mg by mouth every morning. )  . aspirin EC 81 MG tablet Take 81 mg by mouth daily.  Marland Kitchen atorvastatin (LIPITOR) 80 MG tablet Take 80 mg by mouth every evening.   . bisoprolol (ZEBETA) 10 MG tablet TAKE 1 TABLET BY MOUTH EVERY DAY  . cetirizine (ZYRTEC) 10 MG tablet Take 10 mg by mouth at bedtime.   . chlorthalidone (HYGROTON) 25 MG tablet Take 50 mg by mouth every morning. 2 table once daily  . clopidogrel (PLAVIX) 75 MG tablet Take 1 tablet (75 mg total) by mouth daily.  Marland Kitchen escitalopram (LEXAPRO) 20 MG tablet Take 20 mg by mouth every evening.   Marland Kitchen  glimepiride (AMARYL) 4 MG tablet Take 2 mg by mouth 2 (two) times daily. 1/2 tablet daily  . linagliptin (TRADJENTA) 5 MG TABS tablet Take 1 tablet (5 mg total) by mouth every morning.  . meloxicam (MOBIC) 15 MG tablet Take 15 mg by mouth daily.  . metFORMIN (GLUCOPHAGE) 1000 MG tablet Take 1 tablet (1,000 mg total) by mouth 2 (two) times daily with a meal.  . Misc Natural Products (LUTEIN VISION BLEND PO) Take 1 capsule by mouth daily.  . Multiple Vitamin (MULTIVITAMIN) tablet Take 1 tablet by mouth daily.  . nitroGLYCERIN (NITROSTAT) 0.4 MG SL tablet Place 1 tablet (0.4 mg total) under the tongue every 5 (five) minutes as needed for chest pain.  . Omega-3 Fatty Acids (FISH OIL) 1200 MG CAPS Take 1,200 mg by mouth daily.  Marland Kitchen omeprazole (PRILOSEC) 20 MG capsule Take 20 mg by mouth in the morning and at bedtime.  Marland Kitchen REPATHA SURECLICK [id number] MG/ML SOAJ ADMINISTER 1 ML UNDER THE SKIN EVERY 14 DAYS  . spironolactone (ALDACTONE) 25 MG tablet Take 1 tablet (25 mg total) by mouth daily.  . Vitamin D, Ergocalciferol, (DRISDOL) 1.25 MG (50000 UT) CAPS capsule Take 1 capsule (50,000 Units total) by mouth once a week.  . [DISCONTINUED] benazepril (LOTENSIN) 40 MG tablet Take 40 mg by mouth every evening.     Cardiac Studies:   Left heart catheterization 01/18/2019: Normal LV systolic function, normal LVEDP.  No pressure gradient across the aortic valve. RCA is dominant and has mild disease.  Circumflex coronary artery is moderate-sized vessel with mild disease. LAD is a large-caliber vessel, with high-grade 95% proximal stenosis followed by tandem 70 to 80% stenosis.  Moderate amount of calcification evident. Successful orbital atherectomy followed by stenting with 3.5 x 30 mm resolute [place] at 12 atmospheric pressure for 60 seconds followed by postdilatation with 3.5 x 18 mm [place] balloon at 16  atmospheric pressure x2.  Stenosis reduced to 0% with TIMI-3 to TIMI-3 flow.  Recommendation: Patient will be  discharged home today with outpatient follow-up.  She will need aspirin indefinitely and Plavix for at least a period of 6 months.  Omeprazole will be changed over to pantoprazole in view of interaction.  120 mL contrast utilized  The [employer] Stress Test 12/27/2018: 1.  Resting EKG normal sinus rhythm, stress EKG nondiagnostic due to pharmacologic stress. 2.  Left ventricle is dilated both in rest and stress images, LV end-diastolic volume 123456 mL. Perfusion images reveal a small sized mild to moderate amount of ischemia in the apical inferior and periapical region.  LVEF elevated at 47%.  This is an intermediate risk study. No previous exam available for comparison  Echocardiogram 11/16/2018:  Normal LV systolic function with EF 66%. Left ventricle cavity is normal in size. Moderate concentric hypertrophy of the left ventricle. Normal global wall motion. Doppler evidence of grade I (impaired) diastolic dysfunction, normal LAP. Calculated EF 66%. Left atrial cavity is moderately dilated at 4.5 cm. Right ventricle cavity is mildly dilated. Normal right ventricular function. Compared to the study done on 12/21/2013, no significant change.  Carotid Doppler [12/27/2013]: No evidence of hemodynamically significant stenosis in the bilateral carotid bifurcation vessels.  Assessment:   Coronary artery disease involving native coronary artery of native heart without angina pectoris  Mixed hyperlipidemia - Plan: Lipid Profile, Comprehensive Metabolic Panel (CMET)  Essential hypertension  ACE-inhibitor cough  EKG 11/02/2018: Sinus bradycardia at 54 bpm, normal axis, low-voltage complexes, no evidence of ischemia.  EKG 01/28/2019: Sinus bradycardia at 59 bpm, normal axis, T wave abnormality in anteroseptal leads, cannot exclude ischemia.    Recommendations:   [name] [name]  is a 69 y.o. female  with diabetes mellitus which is recently uncontrolled, morbid obesity with obstructive sleep  apnea recently established and is presently on CPAP since 2018, mixed hyperlipidemia, chronic palpitations with history of PACs and PVCs that have remained stable, and CAD s/p stent to LAD on 01/18/19. She now presents for 3 month follow up.  Patient is doing well since last seen by me 3 months ago.  She has not had any recurrence of angina.  She is on appropriate medical therapy including dual antiplatelet therapy.  We will continue the same.  No clinical evidence of heart failure.  Her blood pressure remains well controlled.  She is complaining of dry hacking cough, likely related to benazepril.  We will discontinue and change to losartan 50 mg daily.  Encouraged her to continue to monitor her blood pressure at home. Will arrange for nurse BP check in 3-4 weeks to ensure that blood pressure is staying well controlled.   She has not had follow-up labs since being on Repatha.  Will obtain lipid panel along with CMP for surveillance of this.  We will notify her of results.  I have encouraged her to continue to work on efforts towards weight loss.  I will plan to see her back in 56-month or follow-up, but encouraged her to contact me sooner if needed.  Miquel Dunn, MSN, APRN, FNP-C [facility] [place] Office: [phone] Fax: [phone]

## 2019-05-04 ENCOUNTER — Encounter: Payer: Self-pay | Admitting: Cardiology

## 2019-05-17 ENCOUNTER — Encounter: Payer: Self-pay | Admitting: Certified Nurse Midwife

## 2019-05-26 ENCOUNTER — Other Ambulatory Visit: Payer: Self-pay

## 2019-05-26 ENCOUNTER — Ambulatory Visit: Payer: Self-pay | Admitting: Cardiology

## 2019-05-26 VITALS — BP 144/68 | HR 61 | Temp 98.0°F | Ht 63.0 in | Wt 252.5 lb

## 2019-05-26 DIAGNOSIS — I10 Essential (primary) hypertension: Secondary | ICD-10-CM

## 2019-06-03 ENCOUNTER — Telehealth: Payer: Self-pay

## 2019-06-03 ENCOUNTER — Other Ambulatory Visit: Payer: Self-pay

## 2019-06-03 MED ORDER — NITROGLYCERIN 0.4 MG SL SUBL: 0.4000 mg | SUBLINGUAL_TABLET | SUBLINGUAL | 3 refills | Status: DC | PRN

## 2019-06-03 NOTE — Telephone Encounter (Signed)
Error

## 2019-06-28 ENCOUNTER — Other Ambulatory Visit: Payer: Self-pay | Admitting: Cardiology

## 2019-07-22 ENCOUNTER — Other Ambulatory Visit: Payer: Self-pay | Admitting: Pharmacist

## 2019-08-26 ENCOUNTER — Other Ambulatory Visit: Payer: Self-pay | Admitting: Cardiology

## 2019-09-14 ENCOUNTER — Other Ambulatory Visit: Payer: Self-pay | Admitting: Gastroenterology

## 2019-09-14 DIAGNOSIS — R1310 Dysphagia, unspecified: Secondary | ICD-10-CM

## 2019-09-15 ENCOUNTER — Ambulatory Visit
Admission: RE | Admit: 2019-09-15 | Discharge: 2019-09-15 | Disposition: A | Discharge status: Home / Self Care | Payer: Medicare Other | Source: Ambulatory Visit | Attending: Gastroenterology | Admitting: Gastroenterology

## 2019-09-15 DIAGNOSIS — R1310 Dysphagia, unspecified: Secondary | ICD-10-CM

## 2019-10-06 ENCOUNTER — Other Ambulatory Visit: Payer: Self-pay | Admitting: Cardiology

## 2019-10-10 ENCOUNTER — Other Ambulatory Visit: Payer: Self-pay | Admitting: Cardiology

## 2019-10-14 ENCOUNTER — Other Ambulatory Visit: Payer: Self-pay | Admitting: Cardiology

## 2019-11-03 ENCOUNTER — Ambulatory Visit: Payer: Medicare Other | Admitting: Cardiology

## 2019-11-03 ENCOUNTER — Encounter: Payer: Self-pay | Admitting: Cardiology

## 2019-11-03 ENCOUNTER — Other Ambulatory Visit: Payer: Self-pay

## 2019-11-03 VITALS — BP 147/72 | HR 62 | Resp 16 | Ht 63.0 in | Wt 249.0 lb

## 2019-11-03 DIAGNOSIS — I2510 Atherosclerotic heart disease of native coronary artery without angina pectoris: Secondary | ICD-10-CM

## 2019-11-03 DIAGNOSIS — Z8616 Personal history of COVID-19: Secondary | ICD-10-CM

## 2019-11-03 DIAGNOSIS — T464X5A Adverse effect of angiotensin-converting-enzyme inhibitors, initial encounter: Secondary | ICD-10-CM

## 2019-11-03 DIAGNOSIS — Z95820 Peripheral vascular angioplasty status with implants and grafts: Secondary | ICD-10-CM

## 2019-11-03 DIAGNOSIS — E119 Type 2 diabetes mellitus without complications: Secondary | ICD-10-CM

## 2019-11-03 DIAGNOSIS — I5032 Chronic diastolic (congestive) heart failure: Secondary | ICD-10-CM

## 2019-11-03 DIAGNOSIS — I10 Essential (primary) hypertension: Secondary | ICD-10-CM

## 2019-11-03 DIAGNOSIS — E782 Mixed hyperlipidemia: Secondary | ICD-10-CM

## 2019-11-03 NOTE — Progress Notes (Signed)
[name] [name] Date of Birth: 1950/08/04 MRN: [id number] [place], [name], [place] Former Cardiology Providers: Jeri Lager, APRN, FNP-C Primary Cardiologist: Rex Kras, DO, [facility] (established care 11/03/2019)  Date: 11/03/2019 Last Visit: 05/03/2019  Chief Complaint  Patient presents with  . Coronary Artery Disease  . Hypertension  . Hyperlipidemia  . Follow-up    6 month    HPI  [name] [name] is a 69 y.o.  female who presents to the office with a chief complaint of " 43-monthfollow-up on management of heart disease." Patient's past medical history and cardiovascular risk factors include: Diabetes mellitus type 2, morbid obesity, obstructive sleep apnea on CPAP, mixed hyperlipidemia, chronic palpitations with history of PACs and PVCs, CAD status post PCI to the LAD 01/18/2019, postmenopausal female, advanced age.  Prior seen by AMiquel Dunnand now here to reestablish care with myself.  I am seeing her for the first time for the above-mentioned chief complaint.  Patient is seeing u[place] the management of her underlying coronary artery disease.  Since last office visit patient states that she has not had any chest pain or needed to use sublingual nitroglycerin tablets.  Patient continues to have effort related dyspnea which is chronic and stable.  Patient states that symptoms usually resolve shortly after resting.  She also recently was diagnosed with COVID-19 infection back in May 2020 and is recovering well.  Patient is currently on RPlainsfor the management of her underlying hyperlipidemia.  No recent lipid profile to review.  At last office visit patient was transitioned from ACE inhibitor to ARB since she is tolerating therapies well without any side effects or intolerances.  FUNCTIONAL STATUS: No structured exercise program or daily routine.   ALLERGIES: Allergies  Allergen Reactions  . Codeine Palpitations   MEDICATION LIST PRIOR TO VISIT: Current  Outpatient Medications on File Prior to Visit  Medication Sig Dispense Refill  . ACCU-CHEK AVIVA PLUS test strip U UTD  5  . ACCU-CHEK SOFTCLIX LANCETS lancets U UTD  11  . amLODipine (NORVASC) 10 MG tablet Take 1 tablet (10 mg total) by mouth daily. (Patient taking differently: Take 10 mg by mouth every morning. ) 30 tablet 2  . aspirin EC 81 MG tablet Take 81 mg by mouth daily.    .Marland Kitchenatorvastatin (LIPITOR) 80 MG tablet Take 80 mg by mouth every evening.   5  . bisoprolol (ZEBETA) 10 MG tablet TAKE 1 TABLET BY MOUTH EVERY DAY 90 tablet 1  . cetirizine (ZYRTEC) 10 MG tablet Take 10 mg by mouth at bedtime.     . chlorthalidone (HYGROTON) 25 MG tablet Take 50 mg by mouth every morning. 2 table once daily  3  . clopidogrel (PLAVIX) 75 MG tablet Take 1 tablet (75 mg total) by mouth daily. 30 tablet 11  . Dulaglutide 0.75 MG/0.5ML SOPN Inject into the skin.    .Marland Kitchenescitalopram (LEXAPRO) 20 MG tablet Take 20 mg by mouth every evening.     .Marland Kitchenglimepiride (AMARYL) 4 MG tablet Take 2 mg by mouth 2 (two) times daily. 1/2 tablet daily    . losartan (COZAAR) 50 MG tablet TAKE 1 TABLET(50 MG) BY MOUTH DAILY 30 tablet 3  . meloxicam (MOBIC) 15 MG tablet Take 15 mg by mouth daily.    . metFORMIN (GLUCOPHAGE) 1000 MG tablet Take 1 tablet (1,000 mg total) by mouth 2 (two) times daily with a meal. 60 tablet 0  . Misc Natural Products (LUTEIN VISION BLEND PO) Take  1 capsule by mouth daily.    . Multiple Vitamin (MULTIVITAMIN) tablet Take 1 tablet by mouth daily.    . nitroGLYCERIN (NITROSTAT) 0.4 MG SL tablet PLACE 1 TABLET UNDER THE TONGUE EVERY 5 MINUTES AS NEEDED FOR CHEST PAIN 25 tablet 3  . Omega-3 Fatty Acids (FISH OIL) 1200 MG CAPS Take 1,200 mg by mouth daily.    . pantoprazole (PROTONIX) 40 MG tablet Take 1 tablet (40 mg total) by mouth daily before breakfast. 30 tablet 2  . REPATHA SURECLICK 130 MG/ML SOAJ ADMINISTER 1 ML UNDER THE SKIN EVERY 14 DAYS 2 mL 5  . spironolactone (ALDACTONE) 25 MG tablet TAKE 1  TABLET(25 MG) BY MOUTH DAILY 90 tablet 1  . Vitamin D, Ergocalciferol, (DRISDOL) 1.25 MG (50000 UT) CAPS capsule Take 1 capsule (50,000 Units total) by mouth once a week. 4 capsule 0   No current facility-administered medications on file prior to visit.    PAST MEDICAL HISTORY: Past Medical History:  Diagnosis Date  . Carpal tunnel syndrome of right wrist   . Coronary artery disease    per cath 11-29-2013  mild diffuse CAD and calcification in proximal LAD  . Depression   . Diverticulosis   . GERD (gastroesophageal reflux disease)   . History of endometrial cancer 05/2003   FIGO, Grade 1  s/p  TAG w/ BSO  . HTN (hypertension)   . Hyperlipidemia   . OA (osteoarthritis)   . OSA on CPAP    followed by dr Elsworth Soho-- per study 03-26-2012  mod. to severe OSA , AHI 37/hr  . Type II diabetes mellitus ([place])    followed by pcp  . Urinary incontinence in female   . Vitamin D deficiency     PAST SURGICAL HISTORY: Past Surgical History:  Procedure Laterality Date  . CARDIAC CATHETERIZATION  11/29/2013   dr Einar Gip   mild diffuse noncritial CAD and mild calcification involving the proximal LAD,  LVEF 55-60%  . CARPAL TUNNEL RELEASE Right 07/17/2017   Procedure: CARPAL TUNNEL RELEASE;  Surgeon: Dorna Leitz, MD;  Location: [place];  Service: Orthopedics;  Laterality: Right;  . CORONARY ATHERECTOMY N/A 01/18/2019   Procedure: CORONARY ATHERECTOMY;  Surgeon: Adrian Prows, MD;  Location: [place] CV [facility];  Service: Cardiovascular;  Laterality: N/A;  . CORONARY STENT INTERVENTION N/A 01/18/2019   Procedure: CORONARY STENT INTERVENTION;  Surgeon: Adrian Prows, MD;  Location: [place] [facility];  Service: Cardiovascular;  Laterality: N/A;  . KNEE ARTHROSCOPY Left 10/ 2004;  05-17-2008   dr graves  . KNEE ARTHROSCOPY Right 11/2012  . LAPAROSCOPIC CHOLECYSTECTOMY  11-19-2006  dr Zella Richer  [facility]  . LEFT HEART CATH AND CORONARY ANGIOGRAPHY N/A 01/18/2019   Procedure: LEFT HEART CATH AND  CORONARY ANGIOGRAPHY;  Surgeon: Adrian Prows, MD;  Location: [place] [facility];  Service: Cardiovascular;  Laterality: N/A;  . LEFT HEART CATHETERIZATION WITH CORONARY ANGIOGRAM N/A 11/29/2013   Procedure: LEFT HEART CATHETERIZATION WITH CORONARY ANGIOGRAM;  Surgeon: Laverda Page, MD;  Location: [facility] CATH LAB;  Service: Cardiovascular;  Laterality: N/A;  . TOTAL ABDOMINAL HYSTERECTOMY W/ BILATERAL SALPINGOOPHORECTOMY  06-20-2003    dr Mohammed Kindle  [facility]   endometrial cancer  . TOTAL HIP ARTHROPLASTY Right 09/02/2013   Procedure: RIGHT TOTAL HIP ARTHROPLASTY ANTERIOR APPROACH;  Surgeon: Alta Corning, MD;  Location: [place];  Service: Orthopedics;  Laterality: Right;  . TOTAL KNEE ARTHROPLASTY Left 08/02/2012   Procedure: TOTAL KNEE ARTHROPLASTY;  Surgeon: Alta Corning, MD;  Location: [place];  Service: Orthopedics;  Laterality: Left;  left total knee arthroplasty  . TUBAL LIGATION  yrs ago    FAMILY HISTORY: The patient's family history includes Cancer in her sister; Diabetes in her brother, father, mother, and sister; Diabetes (age of onset: 35) in her sister; Diabetes type II in her father; Heart disease (age of onset: 6) in her brother and father; Heart disease (age of onset: 24) in her sister; High Cholesterol in her father and mother; Hypertension in her brother, father, mother, and sister; Lung cancer (age of onset: 34) in her father; Lupus in an other family member; Obesity in her mother; Pancreatic cancer in an other family member; Sudden death in her mother; Thyroid disease in her mother.   SOCIAL HISTORY:  The patient  reports that she has never smoked. She has never used smokeless tobacco. She reports that she does not drink alcohol and does not use drugs.  Review of Systems  Constitutional: Negative for chills and fever.  HENT: Negative for hoarse voice and nosebleeds.   Eyes: Negative for discharge, double vision and pain.  Cardiovascular: Positive for dyspnea on exertion. Negative for  chest pain, claudication, leg swelling, near-syncope, orthopnea, palpitations, paroxysmal nocturnal dyspnea and syncope.  Respiratory: Negative for hemoptysis and shortness of breath.   Musculoskeletal: Negative for muscle cramps and myalgias.  Gastrointestinal: Negative for abdominal pain, constipation, diarrhea, hematemesis, hematochezia, melena, nausea and vomiting.  Neurological: Negative for dizziness and light-headedness.    PHYSICAL EXAM: Vitals with BMI 11/03/2019 05/26/2019 05/03/2019  Height _0  _1  _2   Weight 249 lbs 252 lbs 8 oz 255 lbs  BMI 44.12 75.91 63.84  Systolic 665 993 570  Diastolic 72 68 66  Pulse 62 61 65    CONSTITUTIONAL: Well-developed and well-nourished. No acute distress.  SKIN: Skin is warm and dry. No rash noted. No cyanosis. No pallor. No jaundice HEAD: Normocephalic and atraumatic.  EYES: No scleral icterus MOUTH/THROAT: Moist oral membranes.  NECK: No JVD present. No thyromegaly noted. No carotid bruits  LYMPHATIC: No visible cervical adenopathy.  CHEST Normal respiratory effort. No intercostal retractions  LUNGS: Clear to auscultation bilaterally.  No stridor. No wheezes. No rales.  CARDIOVASCULAR: Regular rate and rhythm, positive S1-S2, no murmurs rubs or gallops appreciated. ABDOMINAL: No apparent ascites.  EXTREMITIES: Trace bilateral peripheral edema, warm to touch. HEMATOLOGIC: No significant bruising NEUROLOGIC: Oriented to person, place, and time. Nonfocal. Normal muscle tone.  PSYCHIATRIC: Normal mood and affect. Normal behavior. Cooperative  CARDIAC DATABASE: EKG: 01/28/2019: Sinus bradycardia at 59 bpm, normal axis, T wave abnormality in anteroseptal leads, cannot exclude ischemia. 11/03/2019: Normal sinus rhythm, 61 bpm, nonspecific T wave abnormality consider old anterior infarct, without underlying injury pattern.   Echocardiogram: PCV ECHOCARDIOGRAM COMPLETE 11/16/2018 Echocardiogram 11/16/2018: Normal LV systolic function with  EF 66%. Left ventricle cavity is normal in size. Moderate concentric hypertrophy of the left ventricle. Normal global wall motion. Doppler evidence of grade I (impaired) diastolic dysfunction, normal LAP. Calculated EF 66%. Left atrial cavity is moderately dilated at 4.5 cm. Right ventricle cavity is mildly dilated. Normal right ventricular function. Compared to the study done on 12/21/2013, no significant change.    Stress Testing:  PCV MYOCARDIAL PERFUSION WITH LEXISCAN 12/27/2018 Lexiscan Myoview Stress Test 12/27/2018: 1.  Resting EKG normal sinus rhythm, stress EKG nondiagnostic due to pharmacologic stress. 2.  Left ventricle is dilated both in rest and stress images, LV end-diastolic volume 177 mL. Perfusion images reveal a small sized mild  to moderate amount of ischemia in the apical inferior and periapical region.  LVEF elevated at 47%.  This is an intermediate risk study. No previous exam available for comparison.  Heart Catheterization: 01/18/2019: Normal LV systolic function, normal LVEDP. No pressure gradient across the aortic valve. RCA is dominant and has mild disease. Circumflex coronary artery is moderate-sized vessel with mild disease. LAD is a large-caliber vessel, with high-grade 95% proximal stenosis followed by tandem 70 to 80% stenosis. Moderate amount of calcification evident. Successful orbital atherectomy followed by stenting with 3.5 x 30 mm resolute Onyx at 12 atmospheric pressure for 60 seconds followed by postdilatation with 3.5 x 18 mm [place] balloon at 16 atmospheric pressure x2. Stenosis reduced to 0% with TIMI-3 to TIMI-3 flow.  Carotid duplex: [12/27/2013]: No evidence of hemodynamically significant stenosis in the bilateral carotid bifurcation vessels.  LABORATORY DATA: CBC Latest Ref Rng & Units 01/14/2019 07/09/2018 07/07/2018  WBC 3.4 - 10.8 x10E3/uL 7.9 15.7(H) 17.1(H)  Hemoglobin 11.1 - 15.9 g/dL 13.1 13.3 12.9  Hematocrit 34.0 - 46.6 % 39.6 41.4 38.7   Platelets 150 - 450 x10E3/uL 282 334 318    CMP Latest Ref Rng & Units 11/04/2019 01/14/2019 11/17/2018  Glucose 65 - 99 mg/dL 102(H) 167(H) 147(H)  BUN 8 - 27 mg/dL 21 25 34(H)  Creatinine 0.57 - 1.00 mg/dL 0.95 1.07(H) 0.92  Sodium 134 - 144 mmol/L 140 141 137  Potassium 3.5 - 5.2 mmol/L 4.4 5.2 4.8  Chloride 96 - 106 mmol/L 98 100 100  CO2 20 - 29 mmol/L _0 Calcium 8.7 - 10.3 mg/dL 9.9 10.1 9.9  Total Protein 6.0 - 8.5 g/dL 7.3 - -  Total Bilirubin 0.0 - 1.2 mg/dL 0.4 - -  Alkaline Phos 48 - 121 IU/L 63 - -  AST 0 - 40 IU/L 20 - -  ALT 0 - 32 IU/L 29 - -    Lipid Panel     Component Value Date/Time   CHOL 141 11/04/2019 1202   TRIG 316 (H) 11/04/2019 1202   HDL 39 (L) 11/04/2019 1202   CHOLHDL 5.9 (H) 07/16/2015 1339   VLDL NOT CALC 07/16/2015 1339   LDLCALC 53 11/04/2019 1202   LDLDIRECT 53 11/04/2019 1202   LABVLDL 49 (H) 11/04/2019 1202    Lab Results  Component Value Date   HGBA1C 7.4 (H) 10/27/2018   HGBA1C 6.7 (H) 02/01/2018   HGBA1C 6.7 (H) 10/06/2017   No components found for: NTPROBNP Lab Results  Component Value Date   TSH 2.300 10/27/2018   TSH 3.010 04/29/2017   TSH 3.96 07/16/2015    Cardiac Panel (last 3 results) No results for input(s): CKTOTAL, CKMB, TROPONINIHS, RELINDX in the last 72 hours.  IMPRESSION:    ICD-10-CM   1. Coronary artery disease involving native coronary artery of native heart without angina pectoris  I25.10 EKG 12-Lead  2. Status post angioplasty with stent  Z95.820   3. Mixed hyperlipidemia  E78.2 Lipid Panel With LDL/HDL Ratio    LDL cholesterol, direct  4. Essential hypertension  I10   5. ACE-inhibitor cough  R05    T46.4X5A   6. Chronic diastolic (congestive) heart failure (HCC)  I50.32 Pro b natriuretic peptide (BNP)    CMP14+EGFR    Magnesium  7. Type 2 diabetes mellitus without complication, without long-term current use of insulin (HCC)  E11.9      RECOMMENDATIONS: [name] [name] is a 69 y.o.  female whose past medical history and cardiovascular risk  factors include: Diabetes mellitus type 2, morbid obesity, obstructive sleep apnea on CPAP, mixed hyperlipidemia, chronic palpitations with history of PACs and PVCs, CAD status post PCI to the LAD 01/18/2019, postmenopausal female, advanced age.  Established coronary artery disease status post PCI to the LAD without angina pectoris:  Since last office visit patient denies any chest pain or use of sublingual nitroglycerin tablets.  Patient has chronic dyspnea on exertion which is stable.  Last echocardiogram and stress test results reviewed.  Medications reconciled.  Patient tolerated the transition from benazepril to losartan well without any side effects or intolerances.  Currently on high intensity statin therapy and Repatha.  No recent lipid profile to review her blood work.  Check CMP, BNP, fasting lipid profile, direct LDL.  Benign essential hypertension:  Patient is currently on, diuretic therapy.  Recheck kidney function and electrolytes.  If kidney function stable may consider decreasing chlorthalidone 25 mg p.o. daily and increasing losartan to 100 mg p.o. every morning.  Low-salt diet recommended.  Mixed hyperlipidemia: Continue statin therapy and Repatha.  Check fasting lipid profile.  FINAL MEDICATION LIST END OF ENCOUNTER: No orders of the defined types were placed in this encounter.    Current Outpatient Medications:  .  ACCU-CHEK AVIVA PLUS test strip, U UTD, Disp: , Rfl: 5 .  ACCU-CHEK SOFTCLIX LANCETS lancets, U UTD, Disp: , Rfl: 11 .  amLODipine (NORVASC) 10 MG tablet, Take 1 tablet (10 mg total) by mouth daily. (Patient taking differently: Take 10 mg by mouth every morning. ), Disp: 30 tablet, Rfl: 2 .  aspirin EC 81 MG tablet, Take 81 mg by mouth daily., Disp: , Rfl:  .  atorvastatin (LIPITOR) 80 MG tablet, Take 80 mg by mouth every evening. , Disp: , Rfl: 5 .  bisoprolol (ZEBETA) 10 MG tablet,  TAKE 1 TABLET BY MOUTH EVERY DAY, Disp: 90 tablet, Rfl: 1 .  cetirizine (ZYRTEC) 10 MG tablet, Take 10 mg by mouth at bedtime. , Disp: , Rfl:  .  chlorthalidone (HYGROTON) 25 MG tablet, Take 50 mg by mouth every morning. 2 table once daily, Disp: , Rfl: 3 .  clopidogrel (PLAVIX) 75 MG tablet, Take 1 tablet (75 mg total) by mouth daily., Disp: 30 tablet, Rfl: 11 .  Dulaglutide 0.75 MG/0.5ML SOPN, Inject into the skin., Disp: , Rfl:  .  escitalopram (LEXAPRO) 20 MG tablet, Take 20 mg by mouth every evening. , Disp: , Rfl:  .  glimepiride (AMARYL) 4 MG tablet, Take 2 mg by mouth 2 (two) times daily. 1/2 tablet daily, Disp: , Rfl:  .  losartan (COZAAR) 50 MG tablet, TAKE 1 TABLET(50 MG) BY MOUTH DAILY, Disp: 30 tablet, Rfl: 3 .  meloxicam (MOBIC) 15 MG tablet, Take 15 mg by mouth daily., Disp: , Rfl:  .  metFORMIN (GLUCOPHAGE) 1000 MG tablet, Take 1 tablet (1,000 mg total) by mouth 2 (two) times daily with a meal., Disp: 60 tablet, Rfl: 0 .  Misc Natural Products (LUTEIN VISION BLEND PO), Take 1 capsule by mouth daily., Disp: , Rfl:  .  Multiple Vitamin (MULTIVITAMIN) tablet, Take 1 tablet by mouth daily., Disp: , Rfl:  .  nitroGLYCERIN (NITROSTAT) 0.4 MG SL tablet, PLACE 1 TABLET UNDER THE TONGUE EVERY 5 MINUTES AS NEEDED FOR CHEST PAIN, Disp: 25 tablet, Rfl: 3 .  Omega-3 Fatty Acids (FISH OIL) 1200 MG CAPS, Take 1,200 mg by mouth daily., Disp: , Rfl:  .  pantoprazole (PROTONIX) 40 MG tablet, Take 1 tablet (40 mg total)  by mouth daily before breakfast., Disp: 30 tablet, Rfl: 2 .  REPATHA SURECLICK 173 MG/ML SOAJ, ADMINISTER 1 ML UNDER THE SKIN EVERY 14 DAYS, Disp: 2 mL, Rfl: 5 .  spironolactone (ALDACTONE) 25 MG tablet, TAKE 1 TABLET(25 MG) BY MOUTH DAILY, Disp: 90 tablet, Rfl: 1 .  Vitamin D, Ergocalciferol, (DRISDOL) 1.25 MG (50000 UT) CAPS capsule, Take 1 capsule (50,000 Units total) by mouth once a week., Disp: 4 capsule, Rfl: 0  Orders Placed This Encounter  Procedures  . Lipid Panel With  LDL/HDL Ratio  . LDL cholesterol, direct  . Pro b natriuretic peptide (BNP)  . CMP14+EGFR  . Magnesium  . EKG 12-Lead   --Continue cardiac medications as reconciled in final medication list. --Return in about 6 months (around 05/02/2020) for Reevaluation of, CAD, heart failure management.. Or sooner if needed. --Continue follow-up with your primary care physician regarding the management of your other chronic comorbid conditions.  Patient's questions and concerns were addressed to her satisfaction. She voices understanding of the instructions provided during this encounter.   This note was created using a voice recognition software as a result there may be grammatical errors inadvertently enclosed that do not reflect the nature of this encounter. Every attempt is made to correct such errors.  Rex Kras, [place], [facility]  Pager: [phone] Office: [phone]

## 2019-11-05 LAB — LIPID PANEL WITH LDL/HDL RATIO
Cholesterol, Total: 141 mg/dL (ref 100–199)
HDL: 39 mg/dL — ABNORMAL LOW (ref >39)
LDL Chol Calc (NIH): 53 mg/dL (ref 0–99)
LDL/HDL Ratio: 1.4 ratio (ref 0.0–3.2)
Triglycerides: 316 mg/dL — ABNORMAL HIGH (ref 0–149)
VLDL Cholesterol Cal: 49 mg/dL — ABNORMAL HIGH (ref 5–40)

## 2019-11-05 LAB — CMP14+EGFR
ALT: 29 IU/L (ref 0–32)
AST: 20 IU/L (ref 0–40)
Albumin/Globulin Ratio: 1.8 (ref 1.2–2.2)
Albumin: 4.7 g/dL (ref 3.8–4.8)
Alkaline Phosphatase: 63 IU/L (ref 48–121)
BUN/Creatinine Ratio: 22 (ref 12–28)
BUN: 21 mg/dL (ref 8–27)
Bilirubin Total: 0.4 mg/dL (ref 0.0–1.2)
CO2: 27 mmol/L (ref 20–29)
Calcium: 9.9 mg/dL (ref 8.7–10.3)
Chloride: 98 mmol/L (ref 96–106)
Creatinine, Ser: 0.95 mg/dL (ref 0.57–1.00)
GFR calc Af Amer: 71 mL/min/{1.73_m2} (ref >59)
GFR calc non Af Amer: 62 mL/min/{1.73_m2} (ref >59)
Globulin, Total: 2.6 g/dL (ref 1.5–4.5)
Glucose: 102 mg/dL — ABNORMAL HIGH (ref 65–99)
Potassium: 4.4 mmol/L (ref 3.5–5.2)
Sodium: 140 mmol/L (ref 134–144)
Total Protein: 7.3 g/dL (ref 6.0–8.5)

## 2019-11-05 LAB — PRO B NATRIURETIC PEPTIDE: NT-Pro BNP: 114 pg/mL (ref 0–301)

## 2019-11-05 LAB — MAGNESIUM: Magnesium: 1.4 mg/dL — ABNORMAL LOW (ref 1.6–2.3)

## 2019-11-05 LAB — LDL CHOLESTEROL, DIRECT: LDL Direct: 53 mg/dL (ref 0–99)

## 2019-11-07 ENCOUNTER — Other Ambulatory Visit: Payer: Self-pay | Admitting: Cardiology

## 2019-11-07 DIAGNOSIS — I10 Essential (primary) hypertension: Secondary | ICD-10-CM

## 2019-11-07 DIAGNOSIS — E781 Pure hyperglyceridemia: Secondary | ICD-10-CM

## 2019-11-07 DIAGNOSIS — I2510 Atherosclerotic heart disease of native coronary artery without angina pectoris: Secondary | ICD-10-CM

## 2019-11-07 DIAGNOSIS — Z95820 Peripheral vascular angioplasty status with implants and grafts: Secondary | ICD-10-CM

## 2019-11-07 MED ORDER — VASCEPA 1 G PO CAPS: 2.0000 g | ORAL_CAPSULE | Freq: Two times a day (BID) | ORAL | 5 refills | Status: DC

## 2019-11-07 MED ORDER — CHLORTHALIDONE 25 MG PO TABS: 25.0000 mg | ORAL_TABLET | Freq: Every morning | ORAL | 1 refills | Status: DC

## 2019-11-07 MED ORDER — LOSARTAN POTASSIUM 100 MG PO TABS: 100.0000 mg | ORAL_TABLET | Freq: Every day | ORAL | 1 refills | Status: DC

## 2019-11-07 MED ORDER — MAGNESIUM OXIDE 400 MG PO CAPS: 400.0000 mg | ORAL_CAPSULE | Freq: Three times a day (TID) | ORAL | 1 refills | Status: DC

## 2019-11-07 NOTE — Progress Notes (Signed)
Results reviewed.Serum potassium  within normal limits.Kidney function is relatively at baseline. We will prescribe magnesium oxide 400 mg p.o. 3 times daily.  Her magnesium level is 1.4 and it should be greater than or equal to 1.6. Please tell the patient to decrease her chlorthalidone to 25 mg p.o. every morning. Increase losartan to 100 mg p.o. daily.  We will start Vascepa as her triglyceride levels are not at goal.  She will need repeat blood work in 1 week to evaluate kidney function as we are decreasing chlorthalidone and going up on losartan.  She will also need fasting blood work in 6 weeks to reevaluate lipid profile.

## 2019-11-07 NOTE — Progress Notes (Signed)
Called pt to inform her about her lab results and the changes to medication. Pt understood

## 2019-11-16 ENCOUNTER — Other Ambulatory Visit: Payer: Self-pay | Admitting: Cardiology

## 2019-11-16 DIAGNOSIS — E781 Pure hyperglyceridemia: Secondary | ICD-10-CM

## 2019-11-16 LAB — LIPID PANEL WITH LDL/HDL RATIO
Cholesterol, Total: 101 mg/dL (ref 100–199)
HDL: 38 mg/dL — ABNORMAL LOW (ref >39)
LDL Chol Calc (NIH): 26 mg/dL (ref 0–99)
LDL/HDL Ratio: 0.7 ratio (ref 0.0–3.2)
Triglycerides: 241 mg/dL — ABNORMAL HIGH (ref 0–149)
VLDL Cholesterol Cal: 37 mg/dL (ref 5–40)

## 2019-11-16 LAB — BASIC METABOLIC PANEL
BUN/Creatinine Ratio: 26 (ref 12–28)
BUN: 24 mg/dL (ref 8–27)
CO2: 24 mmol/L (ref 20–29)
Calcium: 10.1 mg/dL (ref 8.7–10.3)
Chloride: 100 mmol/L (ref 96–106)
Creatinine, Ser: 0.91 mg/dL (ref 0.57–1.00)
GFR calc Af Amer: 75 mL/min/{1.73_m2} (ref >59)
GFR calc non Af Amer: 65 mL/min/{1.73_m2} (ref >59)
Glucose: 143 mg/dL — ABNORMAL HIGH (ref 65–99)
Potassium: 4.7 mmol/L (ref 3.5–5.2)
Sodium: 140 mmol/L (ref 134–144)

## 2019-11-16 LAB — MAGNESIUM: Magnesium: 1.6 mg/dL (ref 1.6–2.3)

## 2019-11-16 NOTE — Progress Notes (Signed)
Spoke with patient. Patient voiced understanding.

## 2019-11-24 ENCOUNTER — Ambulatory Visit: Payer: Medicare Other | Attending: Internal Medicine

## 2019-11-24 DIAGNOSIS — Z23 Encounter for immunization: Secondary | ICD-10-CM

## 2019-11-24 NOTE — Progress Notes (Signed)
   Covid-19 Vaccination Clinic  Name:  KACY HEGNA    MRN: [id number] DOB: November 18, 1950  11/24/2019  Ms. Racz was observed post Covid-19 immunization for   Covid-19 Vaccination Clinic  Name:  TAMRAH VICTORINO    MRN: [id number] DOB: December 29, 1950  11/24/2019  Ms. Marshman was observed post Covid-19 immunization for 15 minutes without incident. She was provided with Vaccine Information Sheet and instruction to access the V-Safe system.   Ms. Didonato was instructed to call 911 with any severe reactions post vaccine: Marland Kitchen Difficulty breathing  . Swelling of face and throat  . A fast heartbeat  . A bad rash all over body  . Dizziness and weakness     without incident. She was provided with Vaccine Information Sheet and instruction to access the V-Safe system.   Ms. Carles was instructed to call 911 with any severe reactions post vaccine: Marland Kitchen Difficulty breathing  . Swelling of face and throat  . A fast heartbeat  . A bad rash all over body  . Dizziness and weakness

## 2019-12-19 ENCOUNTER — Other Ambulatory Visit: Payer: Self-pay | Admitting: Cardiology

## 2019-12-27 ENCOUNTER — Other Ambulatory Visit: Payer: Self-pay | Admitting: Cardiology

## 2020-01-18 ENCOUNTER — Other Ambulatory Visit: Payer: Self-pay

## 2020-01-18 ENCOUNTER — Encounter (HOSPITAL_COMMUNITY): Payer: Self-pay | Admitting: Gastroenterology

## 2020-01-27 ENCOUNTER — Other Ambulatory Visit (HOSPITAL_COMMUNITY)
Admission: RE | Admit: 2020-01-27 | Discharge: 2020-01-27 | Disposition: A | Discharge status: Home / Self Care | Payer: Medicare Other | Source: Ambulatory Visit | Attending: Gastroenterology | Admitting: Gastroenterology

## 2020-01-27 DIAGNOSIS — Z20822 Contact with and (suspected) exposure to covid-19: Secondary | ICD-10-CM | POA: Insufficient documentation

## 2020-01-27 DIAGNOSIS — Z01812 Encounter for preprocedural laboratory examination: Secondary | ICD-10-CM | POA: Insufficient documentation

## 2020-01-27 LAB — SARS CORONAVIRUS 2 (TAT 6-24 HRS): SARS Coronavirus 2: NEGATIVE

## 2020-01-31 ENCOUNTER — Encounter (HOSPITAL_COMMUNITY)
Admission: RE | Disposition: A | Discharge status: Home / Self Care | Payer: Self-pay | Source: Home / Self Care | Attending: Gastroenterology

## 2020-01-31 ENCOUNTER — Ambulatory Visit (HOSPITAL_COMMUNITY): Payer: Medicare Other | Admitting: Certified Registered Nurse Anesthetist

## 2020-01-31 ENCOUNTER — Encounter (HOSPITAL_COMMUNITY): Payer: Self-pay | Admitting: Gastroenterology

## 2020-01-31 ENCOUNTER — Ambulatory Visit (HOSPITAL_COMMUNITY)
Admission: RE | Admit: 2020-01-31 | Discharge: 2020-01-31 | Disposition: A | Discharge status: Home / Self Care | Payer: Medicare Other | Attending: Gastroenterology | Admitting: Gastroenterology

## 2020-01-31 ENCOUNTER — Other Ambulatory Visit: Payer: Self-pay

## 2020-01-31 DIAGNOSIS — Z7982 Long term (current) use of aspirin: Secondary | ICD-10-CM | POA: Diagnosis not present

## 2020-01-31 DIAGNOSIS — K5730 Diverticulosis of large intestine without perforation or abscess without bleeding: Secondary | ICD-10-CM | POA: Diagnosis not present

## 2020-01-31 DIAGNOSIS — Z7984 Long term (current) use of oral hypoglycemic drugs: Secondary | ICD-10-CM | POA: Insufficient documentation

## 2020-01-31 DIAGNOSIS — Z79899 Other long term (current) drug therapy: Secondary | ICD-10-CM | POA: Insufficient documentation

## 2020-01-31 DIAGNOSIS — Z96653 Presence of artificial knee joint, bilateral: Secondary | ICD-10-CM | POA: Insufficient documentation

## 2020-01-31 DIAGNOSIS — Z791 Long term (current) use of non-steroidal anti-inflammatories (NSAID): Secondary | ICD-10-CM | POA: Diagnosis not present

## 2020-01-31 DIAGNOSIS — Z86010 Personal history of colonic polyps: Secondary | ICD-10-CM | POA: Insufficient documentation

## 2020-01-31 DIAGNOSIS — Z1211 Encounter for screening for malignant neoplasm of colon: Secondary | ICD-10-CM | POA: Diagnosis not present

## 2020-01-31 HISTORY — PX: COLONOSCOPY WITH PROPOFOL: SHX5780

## 2020-01-31 LAB — GLUCOSE, CAPILLARY: Glucose-Capillary: 92 mg/dL (ref 70–99)

## 2020-01-31 SURGERY — COLONOSCOPY WITH PROPOFOL
Anesthesia: Monitor Anesthesia Care

## 2020-01-31 MED ORDER — LACTATED RINGERS IV SOLN: INTRAVENOUS | Status: DC | PRN

## 2020-01-31 MED ORDER — LACTATED RINGERS IV SOLN: INTRAVENOUS | Status: DC

## 2020-01-31 MED ORDER — PROPOFOL 500 MG/50ML IV EMUL
INTRAVENOUS | Status: AC
  Filled 2020-01-31: qty 50

## 2020-01-31 MED ORDER — PROPOFOL 10 MG/ML IV BOLUS
INTRAVENOUS | Status: DC | PRN
  Administered 2020-01-31 (×5): 30 mg via INTRAVENOUS

## 2020-01-31 MED ORDER — PROPOFOL 500 MG/50ML IV EMUL
INTRAVENOUS | Status: DC | PRN
  Administered 2020-01-31: 100 ug/kg/min via INTRAVENOUS

## 2020-01-31 MED ORDER — PROPOFOL 1000 MG/100ML IV EMUL
INTRAVENOUS | Status: AC
  Filled 2020-01-31: qty 200

## 2020-01-31 SURGICAL SUPPLY — 21 items

## 2020-01-31 NOTE — Anesthesia Preprocedure Evaluation (Addendum)
Anesthesia Evaluation  Patient identified by MRN, date of birth, ID band Patient awake    Reviewed: Allergy & Precautions, NPO status , Patient's Chart, lab work & pertinent test results  History of Anesthesia Complications Negative for: history of anesthetic complications  Airway Mallampati: III  TM Distance: >3 FB Neck ROM: Full    Dental  (+) Dental Advisory Given   Pulmonary neg shortness of breath, sleep apnea , neg COPD, neg recent URI,  Covid-19 Nucleic Acid Test Results Lab Results      Component                Value               Date                      [place]              NEGATIVE            01/27/2020                [place]              NOT DETECTED        01/14/2019                [place]              Not Detected        08/02/2018              breath sounds clear to auscultation       Cardiovascular hypertension, Pt. on medications and Pt. on home beta blockers (-) angina+ CAD and + Cardiac Stents  (-) CHF (-) dysrhythmias  Rhythm:Regular  Normal LV systolic function, normal LVEDP.  No pressure gradient across the aortic valve. RCA is dominant and has mild disease.  Circumflex coronary artery is moderate-sized vessel with mild disease. LAD is a large-caliber vessel, with high-grade 95% proximal stenosis followed by tandem 70 to 80% stenosis.  Moderate amount of calcification evident. Successful orbital atherectomy followed by stenting with 3.5 x 30 mm resolute Onyx at 12 atmospheric pressure for 60 seconds followed by postdilatation with 3.5 x 18 mm Florence balloon at 16 atmospheric pressure x2.  Stenosis reduced to 0% with TIMI-3 to TIMI-3 flow.  Recommendation: Patient will be discharged home today with outpatient follow-up.  She will need aspirin indefinitely and Plavix for at least a period of 6 months.  Omeprazole will be changed over to pantoprazole in view of interaction.  120 mL contrast utilized.     Neuro/Psych PSYCHIATRIC DISORDERS Depression  Neuromuscular disease    GI/Hepatic Neg liver ROS, GERD  Medicated and Controlled,Lab Results      Component                Value               Date                      ALT                      29                  11/04/2019                AST  20                  11/04/2019                ALKPHOS                  63                  11/04/2019                BILITOT                  0.4                 11/04/2019              Endo/Other  diabetes, Type 2Morbid obesityLab Results      Component                Value               Date                      HGBA1C                   7.4 (H)             10/27/2018             Renal/GU negative Renal ROSLab Results      Component                Value               Date                      CREATININE               0.91                11/15/2019                Musculoskeletal  (+) Arthritis ,   Abdominal   Peds  Hematology Lab Results      Component                Value               Date                      WBC                      7.9                 01/14/2019                HGB                      13.1                01/14/2019                HCT                      39.6                01/14/2019                MCV  89                  01/14/2019                PLT                      282                 01/14/2019            plavix for cardiac stent   Anesthesia Other Findings Echocardiogram: PCV ECHOCARDIOGRAM COMPLETE 11/16/2018 Echocardiogram 11/16/2018: Normal LV systolic function with EF 66%. Left ventricle cavity is normal in size. Moderate concentric hypertrophy of the left ventricle. Normal global wall motion. Doppler evidence of grade I (impaired) diastolic dysfunction, normal LAP. Calculated EF 66%. Left atrial cavity is moderately dilated at 4.5 cm. Right ventricle cavity is mildly dilated. Normal right ventricular  function. Compared to the study done on 12/21/2013, no significant change.    Stress Testing:  PCV MYOCARDIAL PERFUSION WITH LEXISCAN 12/27/2018 Lexiscan Myoview Stress Test 12/27/2018: 1.  Resting EKG normal sinus rhythm, stress EKG nondiagnostic due to pharmacologic stress. 2.  Left ventricle is dilated both in rest and stress images, LV end-diastolic volume 537 mL. Perfusion images reveal a small sized mild to moderate amount of ischemia in the apical inferior and periapical region.  LVEF elevated at 47%.  This is an intermediate risk study. No previous exam available for comparison  Reproductive/Obstetrics                           Anesthesia Physical Anesthesia Plan  ASA: III  Anesthesia Plan: MAC   Post-op Pain Management:    Induction:   PONV Risk Score and Plan: 2 and Propofol infusion and Treatment may vary due to age or medical condition  Airway Management Planned: Nasal Cannula  Additional Equipment: None  Intra-op Plan:   Post-operative Plan:   Informed Consent: I have reviewed the patients History and Physical, chart, labs and discussed the procedure including the risks, benefits and alternatives for the proposed anesthesia with the patient or authorized representative who has indicated his/her understanding and acceptance.     Dental advisory given  Plan Discussed with: CRNA and Surgeon  Anesthesia Plan Comments:         Anesthesia Quick Evaluation

## 2020-01-31 NOTE — Transfer of Care (Signed)
Immediate Anesthesia Transfer of Care Note  Patient: [name] [name]  Procedure(s) Performed: COLONOSCOPY WITH PROPOFOL (N/A )  Patient Location: Endoscopy Unit  Anesthesia Type:MAC  Level of Consciousness: drowsy and responds to stimulation  Airway & Oxygen Therapy: Patient Spontanous Breathing and Patient connected to face mask oxygen  Post-op Assessment: Report given to RN, Post -op Vital signs reviewed and stable and Patient moving all extremities  Post vital signs: Reviewed and stable  Last Vitals:  Vitals Value Taken Time  BP    Temp    Pulse    Resp    SpO2      Last Pain:  Vitals:   01/31/20 0658  TempSrc: Oral  PainSc: 0-No pain         Complications: No complications documented.

## 2020-01-31 NOTE — Op Note (Signed)
[facility] Patient Name: [name] [name] Procedure Date: 01/31/2020 MRN: [id number] Attending MD: Juanita Craver , MD Date of Birth: 17-Oct-1950 CSN: [id number] Age: 69 Admit Type: Outpatient Procedure:                Screening colonoscopy. Indications:              Personal history of colonic polyps; CRC screening                            for colorectal malignant neoplasm. Providers:                Juanita Craver, MD, Cleda Daub, RN, Lesia Sago, Technician, Ladona Ridgel, Technician,                            Caryl Pina CRNA. Referring MD:             Vicenta Aly, NP Medicines:                Monitored Anesthesia Care. Complications:            No immediate complications. Estimated Blood Loss:     Estimated blood loss: none. Procedure:                Pre-Anesthesia Assessment: - Prior to the                            procedure, a history and physical was performed,                            and patient medications and allergies were                            reviewed. The patient's tolerance of previous                            anesthesia was also reviewed. The risks and                            benefits of the procedure and the sedation options                            and risks were discussed with the patient. All                            questions were answered, and informed consent was                            obtained. Prior Anticoagulants: The patient has                            taken no previous anticoagulant or antiplatelet  agents except for aspirin. ASA Grade Assessment:                            III - A patient with severe systemic disease. After                            reviewing the risks and benefits, the patient was                            deemed in satisfactory condition to undergo the                            procedure. After obtaining informed consent, the                             colonoscope was passed under direct vision.                            Throughout the procedure, the patient's blood                            pressure, pulse, and oxygen saturations were                            monitored continuously. The CF-HQ190L ([id number])                            Olympus colonoscope was introduced through the anus                            and advanced to the the cecum, identified by                            appendiceal orifice and ileocecal valve. The                            colonoscopy was performed without difficulty. The                            patient tolerated the procedure well. The quality                            of the bowel preparation was good. The ileocecal                            valve, the appendiceal orifice and the rectum were                            photographed. The bowel preparation used was                            Clenpiq via split dose instruction. Scope In: 7:30:57 AM Scope Out: 7:44:40 AM Scope Withdrawal Time: 0 hours 6 minutes 6 seconds  Total  Procedure Duration: 0 hours 13 minutes 43 seconds  Findings:      A few small-mouthed diverticula were found in the sigmoid colon.      The exam was otherwise without abnormality on direct and retroflexion       views. Impression:               - A few scattered diverticula in the sigmoid colon.                           - The examination was otherwise normal on direct                            and retroflexion views.                           - No specimens collected. Moderate Sedation:      MAC used. Recommendation:           - High fiber, low fat diet with augmented water                            consumption daily.                           - Continue present medications.                           - Repeat colonoscopy in 5 years for surveillance.                           - Return to GI office PRN.                           - If the patient has  any abnormal GI symptoms in                            the interim, she has been advised to call the                            office ASAP for further recommendations. Procedure Code(s):        --- Professional ---                           [id number], Colonoscopy, flexible; diagnostic, including                            collection of specimen(s) by brushing or washing,                            when performed (separate procedure) Diagnosis Code(s):        --- Professional ---                           K57.30, Diverticulosis of large intestine without  perforation or abscess without bleeding                           Z86.010, Personal history of colonic polyps                           Z12.11, Encounter for screening for malignant                            neoplasm of colon CPT copyright 2019 [employer]. All rights reserved. The codes documented in this report are preliminary and upon coder review may  be revised to meet current compliance requirements. Juanita Craver, MD Juanita Craver, MD 01/31/2020 7:55:55 AM This report has been signed electronically. Number of Addenda: 0

## 2020-01-31 NOTE — H&P (Signed)
[name] [name] is an 69 y.o. female.   Chief Complaint: Colorectal cancer screening. HPI: [name] [name] is a 69 year old white female with multiple medical problems listed below, who presents to [facility] today, for screening colonoscopy.  Please see office notes for further details.  Past Medical History:  Diagnosis Date  . Carpal tunnel syndrome of right wrist   . Coronary artery disease    per cath 11-29-2013  mild diffuse CAD and calcification in proximal LAD  . Anxiety/Depression   . Diverticulosis/colonic polyps-TVA   . GERD (gastroesophageal reflux disease)   . History of endometrial cancer 05/2003   FIGO, Grade 1  s/p  TAG w/ BSO  . HTN/Hyperlipidemia   . Covid 06/2018   . OA (osteoarthritis)   . OSA on CPAP    followed by Dr. Elsworth Soho-- per study 03-26-2012  mod. to severe OSA , AHI 37/hr  . Type II diabetes mellitus (HCC)    Nephrolithiasis  . Morbid obesity   . Vitamin D deficiency    Past Surgical History:  Procedure Laterality Date  . CARDIAC CATHETERIZATION  11/29/2013   dr Einar Gip   mild diffuse noncritial CAD and mild calcification involving the proximal LAD,  LVEF 55-60%  . CARPAL TUNNEL RELEASE Right 07/17/2017   Procedure: CARPAL TUNNEL RELEASE;  Surgeon: Dorna Leitz, MD;  Location: [place];  Service: Orthopedics;  Laterality: Right;  . CORONARY ATHERECTOMY N/A 01/18/2019   Procedure: CORONARY ATHERECTOMY;  Surgeon: Adrian Prows, MD;  Location: [place] CV LAB;  Service: Cardiovascular;  Laterality: N/A;  . CORONARY STENT INTERVENTION N/A 01/18/2019   Procedure: CORONARY STENT INTERVENTION;  Surgeon: Adrian Prows, MD;  Location: [place] CV LAB;  Service: Cardiovascular;  Laterality: N/A;  . KNEE ARTHROSCOPY Left 10/ 2004;  05-17-2008   dr graves  . KNEE ARTHROSCOPY Right 11/2012  . LAPAROSCOPIC CHOLECYSTECTOMY  11-19-2006  dr Zella Richer  [facility]  . LEFT HEART CATH AND CORONARY ANGIOGRAPHY N/A 01/18/2019   Procedure: LEFT HEART CATH AND  CORONARY ANGIOGRAPHY;  Surgeon: Adrian Prows, MD;  Location: [place] CV LAB;  Service: Cardiovascular;  Laterality: N/A;  . LEFT HEART CATHETERIZATION WITH CORONARY ANGIOGRAM N/A 11/29/2013   Procedure: LEFT HEART CATHETERIZATION WITH CORONARY ANGIOGRAM;  Surgeon: Laverda Page, MD;  Location: [facility] CATH LAB;  Service: Cardiovascular;  Laterality: N/A;  . TOTAL ABDOMINAL HYSTERECTOMY W/ BILATERAL SALPINGOOPHORECTOMY  06-20-2003    dr Mohammed Kindle  [facility]   endometrial cancer  . TOTAL HIP ARTHROPLASTY Right 09/02/2013   Procedure: RIGHT TOTAL HIP ARTHROPLASTY ANTERIOR APPROACH;  Surgeon: Alta Corning, MD;  Location: [place];  Service: Orthopedics;  Laterality: Right;  . TOTAL KNEE ARTHROPLASTY Left 08/02/2012   Procedure: TOTAL KNEE ARTHROPLASTY;  Surgeon: Alta Corning, MD;  Location: [place];  Service: Orthopedics;  Laterality: Left;  left total knee arthroplasty  . TUBAL LIGATION  yrs ago    Family History  Problem Relation Age of Onset  . Heart disease Father 43       CABG  . Lung cancer Father 22       lung  . Hypertension Father   . Diabetes Father   . High Cholesterol Father   . Diabetes type II Father   . Heart disease Sister 41       CABG  . Diabetes Sister 86  . Heart disease Brother 20       MI with stents  . Hypertension Mother   . Diabetes Mother   .  High Cholesterol Mother   . Sudden death Mother   . Thyroid disease Mother   . Obesity Mother   . Hypertension Sister        x2   . Diabetes Sister   . Hypertension Brother   . Diabetes Brother   . Cancer Sister        high grade Neuroendocrine carcinoma right axilla  . Pancreatic cancer Other        x 3 - 2 MAunts and 1 P Aunts.   . Lupus Other        Pat Aunt   Social History:  reports that she has never smoked. She has never used smokeless tobacco. She reports that she does not drink alcohol and does not use drugs.  Allergies:  Allergies  Allergen Reactions  . Codeine Palpitations    Medications Prior to  Admission  Medication Sig Dispense Refill  . ACCU-CHEK AVIVA PLUS test strip U UTD  5  . ACCU-CHEK SOFTCLIX LANCETS lancets U UTD  11  . acetaminophen (TYLENOL) 500 MG tablet Take 1,000-1,500 mg by mouth 2 (two) times daily as needed (pain).    Marland Kitchen aspirin EC 81 MG tablet Take 81 mg by mouth at bedtime.     Marland Kitchen atorvastatin (LIPITOR) 80 MG tablet Take 80 mg by mouth every evening.   5  . bisoprolol (ZEBETA) 10 MG tablet TAKE 1 TABLET BY MOUTH EVERY DAY (Patient taking differently: Take 10 mg by mouth daily. ) 90 tablet 1  . cetirizine (ZYRTEC) 10 MG tablet Take 10 mg by mouth at bedtime.     . chlorthalidone (HYGROTON) 25 MG tablet Take 1 tablet (25 mg total) by mouth in the morning for 180 doses. 90 tablet 1  . Cholecalciferol (VITAMIN D) 50 MCG (2000 UT) tablet Take 2,000 Units by mouth daily.    Marland Kitchen CLENPIQ 10-3.5-12 MG-GM -GM/160ML SOLN Take 1 kit by mouth as directed.     . clopidogrel (PLAVIX) 75 MG tablet Take 1 tablet (75 mg total) by mouth daily. 30 tablet 11  . escitalopram (LEXAPRO) 20 MG tablet Take 20 mg by mouth every evening.     Marland Kitchen glimepiride (AMARYL) 4 MG tablet Take 4 mg by mouth daily with breakfast.     . losartan (COZAAR) 100 MG tablet Take 1 tablet (100 mg total) by mouth daily. 90 tablet 1  . MAGNESIUM-OXIDE 400 (241.3 Mg) MG tablet Take 400 mg by mouth 3 (three) times daily.     . meloxicam (MOBIC) 15 MG tablet Take 15 mg by mouth daily.    . metFORMIN (GLUCOPHAGE) 1000 MG tablet Take 1 tablet (1,000 mg total) by mouth 2 (two) times daily with a meal. 60 tablet 0  . Misc Natural Products (LUTEIN 20 PO) Take 20 mg by mouth daily.    . Multiple Vitamin (MULTIVITAMIN WITH MINERALS) TABS tablet Take 1 tablet by mouth daily.    Marland Kitchen omeprazole (PRILOSEC) 20 MG capsule Take 20 mg by mouth 2 (two) times daily.    Marland Kitchen REPATHA SURECLICK 570 MG/ML SOAJ ADMINISTER 1 ML UNDER THE SKIN EVERY 14 DAYS (Patient taking differently: Inject 140 mg into the skin every 14 (fourteen) days. ) 2 mL 5  .  TRULICITY 3 VX/7.9TJ SOPN Inject 3 mg into the skin every Friday.     Marland Kitchen VASCEPA 1 g capsule Take 2 capsules (2 g total) by mouth 2 (two) times daily. 120 capsule 5  . amLODipine (NORVASC) 10 MG tablet Take 1 tablet (  10 mg total) by mouth daily. (Patient not taking: Reported on 01/24/2020) 30 tablet 2  . nitroGLYCERIN (NITROSTAT) 0.4 MG SL tablet PLACE 1 TABLET UNDER THE TONGUE EVERY 5 MINUTES AS NEEDED FOR CHEST PAIN (Patient taking differently: Place 0.4 mg under the tongue every 5 (five) minutes x 3 doses as needed for chest pain. ) 25 tablet 3  . pantoprazole (PROTONIX) 40 MG tablet Take 1 tablet (40 mg total) by mouth daily before breakfast. (Patient not taking: Reported on 01/24/2020) 30 tablet 2  . spironolactone (ALDACTONE) 25 MG tablet TAKE 1 TABLET(25 MG) BY MOUTH DAILY (Patient not taking: Reported on 01/24/2020) 90 tablet 1   Review of Systems  Constitutional: Negative for appetite change, chills, diaphoresis, fatigue, fever and unexpected weight change.  HENT: Negative.   Respiratory: Negative.   Cardiovascular: Negative.   Gastrointestinal: Positive for diarrhea. Negative for abdominal distention, abdominal pain, anal bleeding, blood in stool, nausea, rectal pain and vomiting.  Genitourinary: Negative.   Musculoskeletal: Positive for arthralgias. Negative for back pain, joint swelling, myalgias, neck pain and neck stiffness.  Hematological: Negative.   Psychiatric/Behavioral: Positive for dysphoric mood and sleep disturbance. Negative for self-injury and suicidal ideas. The patient is nervous/anxious.    Blood pressure (!) 183/79, pulse 63, temperature 98 F (36.7 C), temperature source Oral, resp. rate 16, height $RemoveBe'5\' 4"'hErUkMYhS$  (1.626 m), weight 112.9 kg, last menstrual period 11/25/2002, SpO2 98 %. Physical Exam Constitutional:      General: She is not in acute distress.    Appearance: Normal appearance. She is obese. She is not diaphoretic.  HENT:     Head: Normocephalic and  atraumatic.  Eyes:     Extraocular Movements: Extraocular movements intact.     Pupils: Pupils are equal, round, and reactive to light.  Cardiovascular:     Rate and Rhythm: Normal rate and regular rhythm.     Pulses: Normal pulses.     Heart sounds: Normal heart sounds.  Pulmonary:     Effort: Pulmonary effort is normal.     Breath sounds: Normal breath sounds.  Abdominal:     General: Bowel sounds are normal.     Palpations: Abdomen is soft.  Musculoskeletal:     Cervical back: Normal range of motion and neck supple.  Skin:    General: Skin is warm and dry.  Neurological:     General: No focal deficit present.     Mental Status: She is alert and oriented to person, place, and time.  Psychiatric:        Mood and Affect: Mood normal.        Behavior: Behavior normal.        Thought Content: Thought content normal.        Judgment: Judgment normal.     Assessment/Plan Colorectal cancer screening-proceed with a colonoscopy at this time.  Juanita Craver, MD 01/31/2020, 7:12 AM

## 2020-01-31 NOTE — Discharge Instructions (Signed)
YOU HAD AN ENDOSCOPIC PROCEDURE TODAY: Refer to the procedure report and other information in the discharge instructions given to you for any specific questions about what was found during the examination. If this information does not answer your questions, please call [facility] at [phone] to clarify.   YOU SHOULD EXPECT: Some feelings of bloating in the abdomen. Passage of more gas than usual. Walking can help get rid of the air that was put into your GI tract during the procedure and reduce the bloating. If you had a lower endoscopy (such as a colonoscopy or flexible sigmoidoscopy) you may notice spotting of blood in your stool or on the toilet paper. Some abdominal soreness may be present for a day or two, also.  DIET: Your first meal following the procedure should be a light meal and then it is ok to progress to your normal diet. A half-sandwich or bowl of soup is an example of a good first meal. Heavy or fried foods are harder to digest and may make you feel nauseous or bloated. Drink plenty of fluids but you should avoid alcoholic beverages for 24 hours. If you had an esophageal dilation, please see attached information for diet.   ACTIVITY: Your care partner should take you home directly after the procedure. You should plan to take it easy, moving slowly for the rest of the day. You can resume normal activity the day after the procedure however YOU SHOULD NOT DRIVE, use power tools, machinery or perform tasks that involve climbing or major physical exertion for 24 hours (because of the sedation medicines used during the test).   SYMPTOMS TO REPORT IMMEDIATELY: A gastroenterologist can be reached at any hour. Please call [phone]  for any of the following symptoms:  Following lower endoscopy (colonoscopy, flexible sigmoidoscopy) Excessive amounts of blood in the stool  Significant tenderness, worsening of abdominal pains  Swelling of the abdomen that is new, acute  Fever of  100 or higher  Please also call with any specific questions about appointments or follow up tests.

## 2020-02-03 ENCOUNTER — Encounter (HOSPITAL_COMMUNITY): Payer: Self-pay | Admitting: Gastroenterology

## 2020-02-03 ENCOUNTER — Other Ambulatory Visit: Payer: Self-pay | Admitting: Cardiology

## 2020-02-03 NOTE — Anesthesia Postprocedure Evaluation (Signed)
Anesthesia Post Note  Patient: [name] [name]  Procedure(s) Performed: COLONOSCOPY WITH PROPOFOL (N/A )     Patient location during evaluation: Endoscopy Anesthesia Type: MAC Level of consciousness: awake and alert Pain management: pain level controlled Vital Signs Assessment: post-procedure vital signs reviewed and stable Respiratory status: spontaneous breathing, nonlabored ventilation, respiratory function stable and patient connected to nasal cannula oxygen Cardiovascular status: stable and blood pressure returned to baseline Postop Assessment: no apparent nausea or vomiting Anesthetic complications: no   No complications documented.  Last Vitals:  Vitals:   01/31/20 0800 01/31/20 0810  BP: (!) 165/39 (!) 181/45  Pulse: 64 63  Resp: 17 18  Temp:    SpO2: 96% 95%    Last Pain:  Vitals:   02/01/20 1344  TempSrc:   PainSc: 0-No pain                 Talon Regala

## 2020-02-18 IMAGING — DX PORTABLE CHEST - 1 VIEW
1 series · 1 of 1 positions shown · non-contrast
Comparison: 11/28/2013

CLINICAL DATA: Shortness of breath

EXAM:
PORTABLE CHEST 1 VIEW

[chest ap]
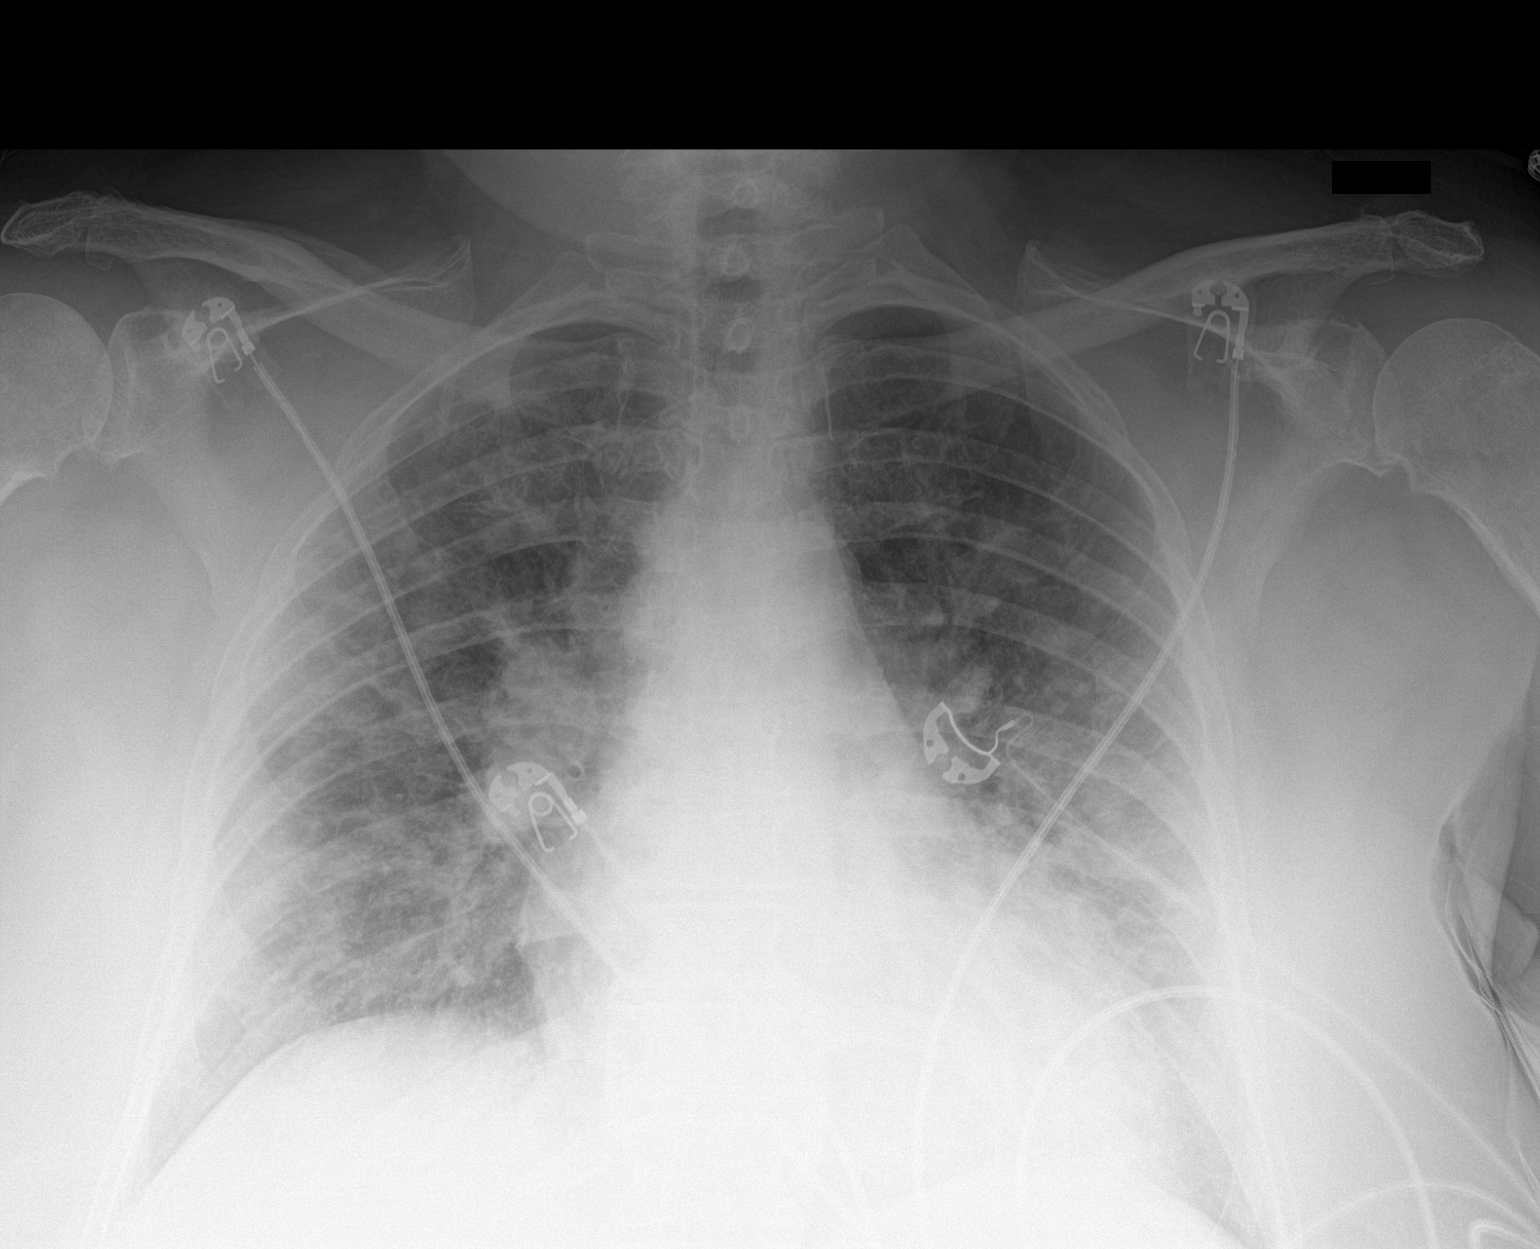

[1 of 1 positions shown; findings below may reference images not displayed]

FINDINGS: The cardiac silhouette is enlarged. There is diffuse bilateral
airspace opacities. No pneumothorax. Trace bilateral pleural
effusions are suspected. There is no definite acute osseous
abnormality.
IMPRESSION: 1. Diffuse bilateral pulmonary airspace opacities concerning for
multifocal pneumonia (viral or bacterial). Pulmonary edema can have
a similar appearance.

2.  Mild cardiomegaly.

## 2020-02-19 IMAGING — DX PORTABLE CHEST - 1 VIEW SAME DAY
1 series · 1 of 1 positions shown · non-contrast
Comparison: 06/30/2018

CLINICAL DATA: 67-year-old female with shortness of breath

EXAM:
PORTABLE CHEST 1 VIEW

[chest ap]
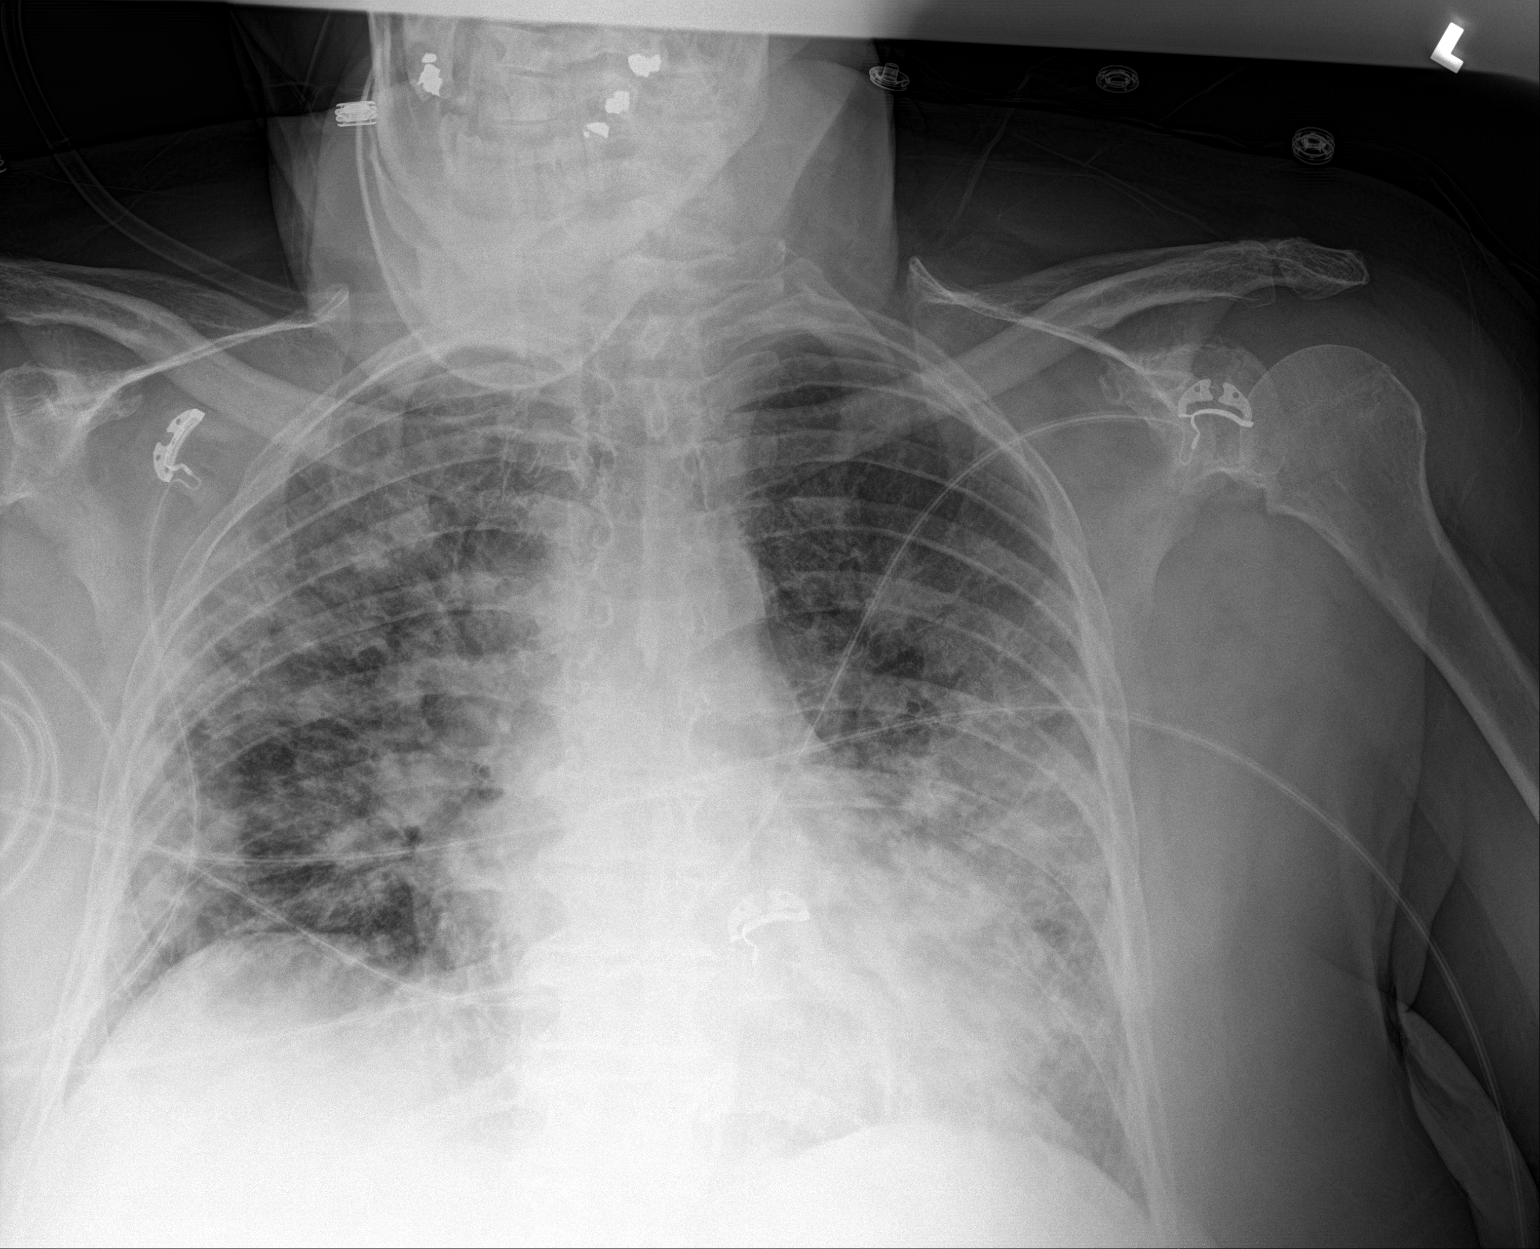

[1 of 1 positions shown; findings below may reference images not displayed]

FINDINGS: Cardiomediastinal silhouette unchanged in size and contour.

Patchy airspace and interstitial opacities the bilateral lungs,
similar to the comparison. No large pleural effusion or
pneumothorax. No displaced fracture.
IMPRESSION: Similar appearance of the chest x-ray with mixed interstitial and
airspace opacities bilaterally.

## 2020-02-21 IMAGING — DX PORTABLE CHEST - 1 VIEW
1 series · 1 of 1 positions shown · non-contrast
Comparison: Radiograph July 01, 2018.

CLINICAL DATA: Acute respiratory failure with hypoxemia.

EXAM:
PORTABLE CHEST 1 VIEW

[chest ap]
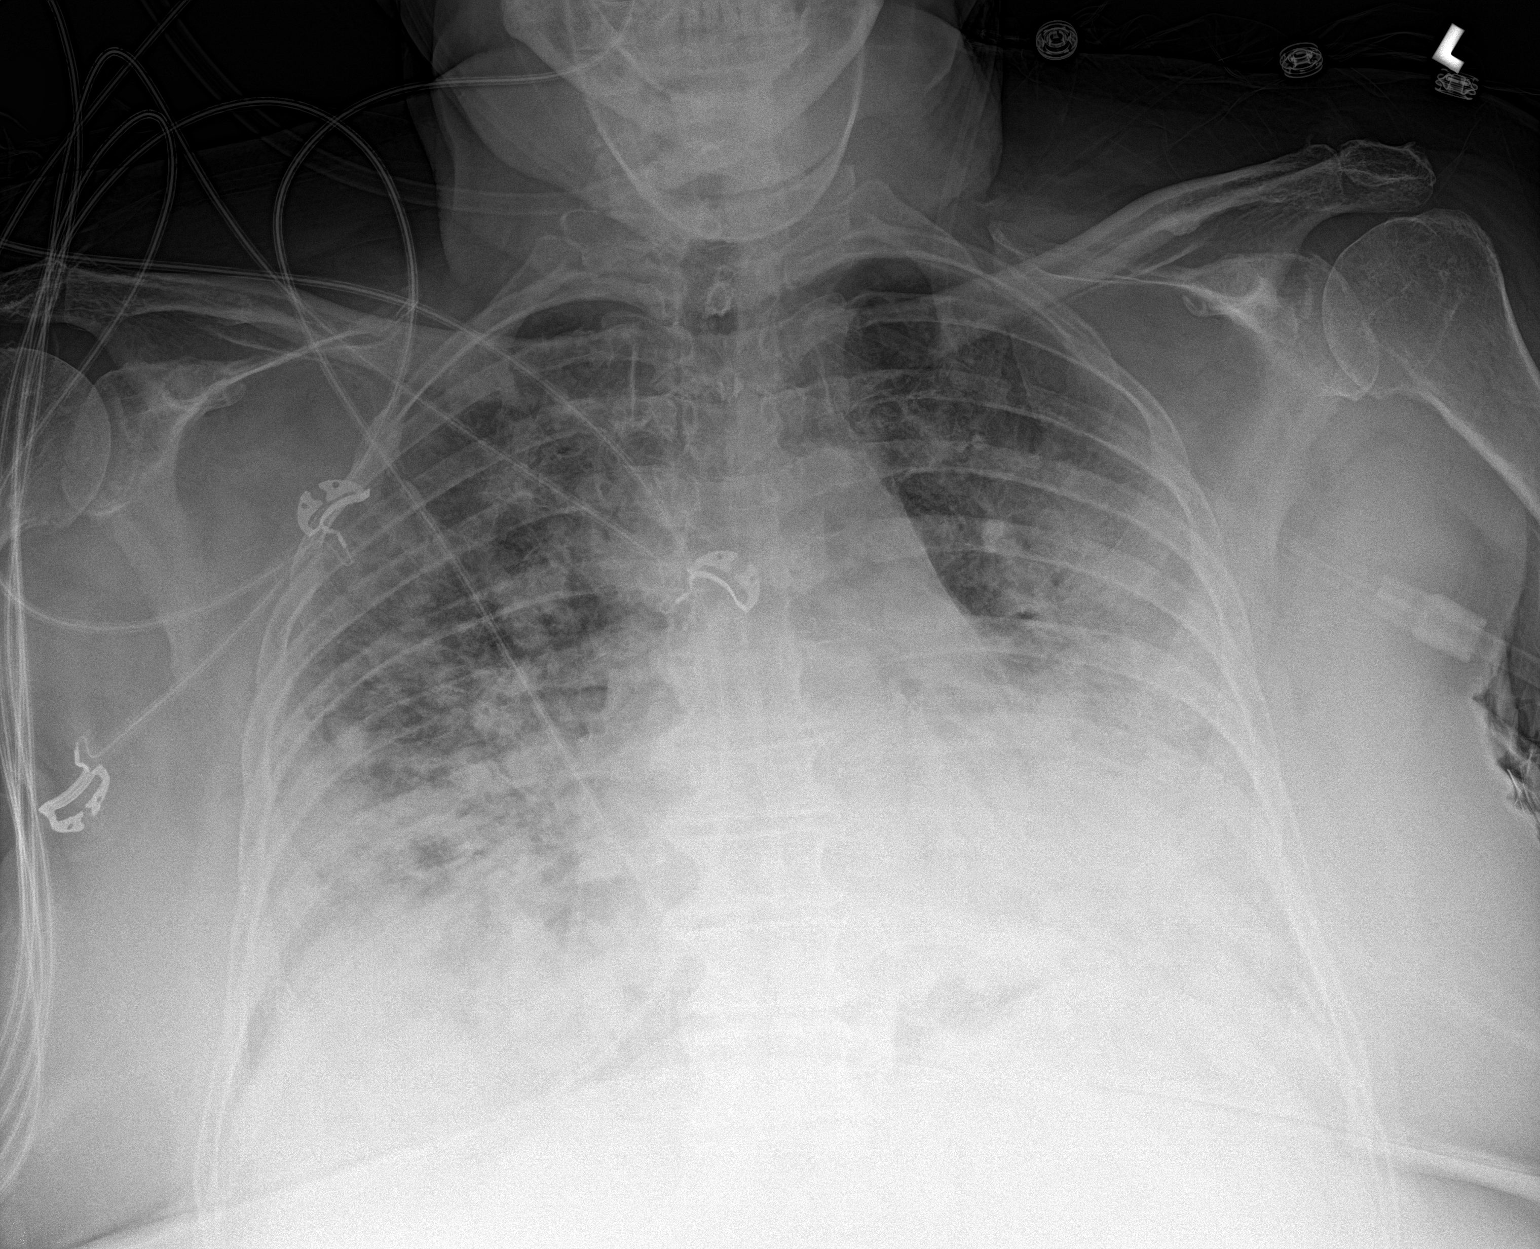

[1 of 1 positions shown; findings below may reference images not displayed]

FINDINGS: Stable cardiomediastinal silhouette. No pneumothorax is noted.
Increased diffuse bilateral lung opacities are noted concerning for
pneumonia. No definite pleural effusion is noted. Bony thorax is
unremarkable.
IMPRESSION: Increased diffuse bilateral lung opacities are noted most consistent
with worsening pneumonia.

## 2020-02-23 IMAGING — DX PORTABLE CHEST - 1 VIEW
1 series · 1 of 1 positions shown · non-contrast
Comparison: Radiographs July 03, 2018.

CLINICAL DATA: Hypoxemia.

EXAM:
PORTABLE CHEST 1 VIEW

[chest ap]
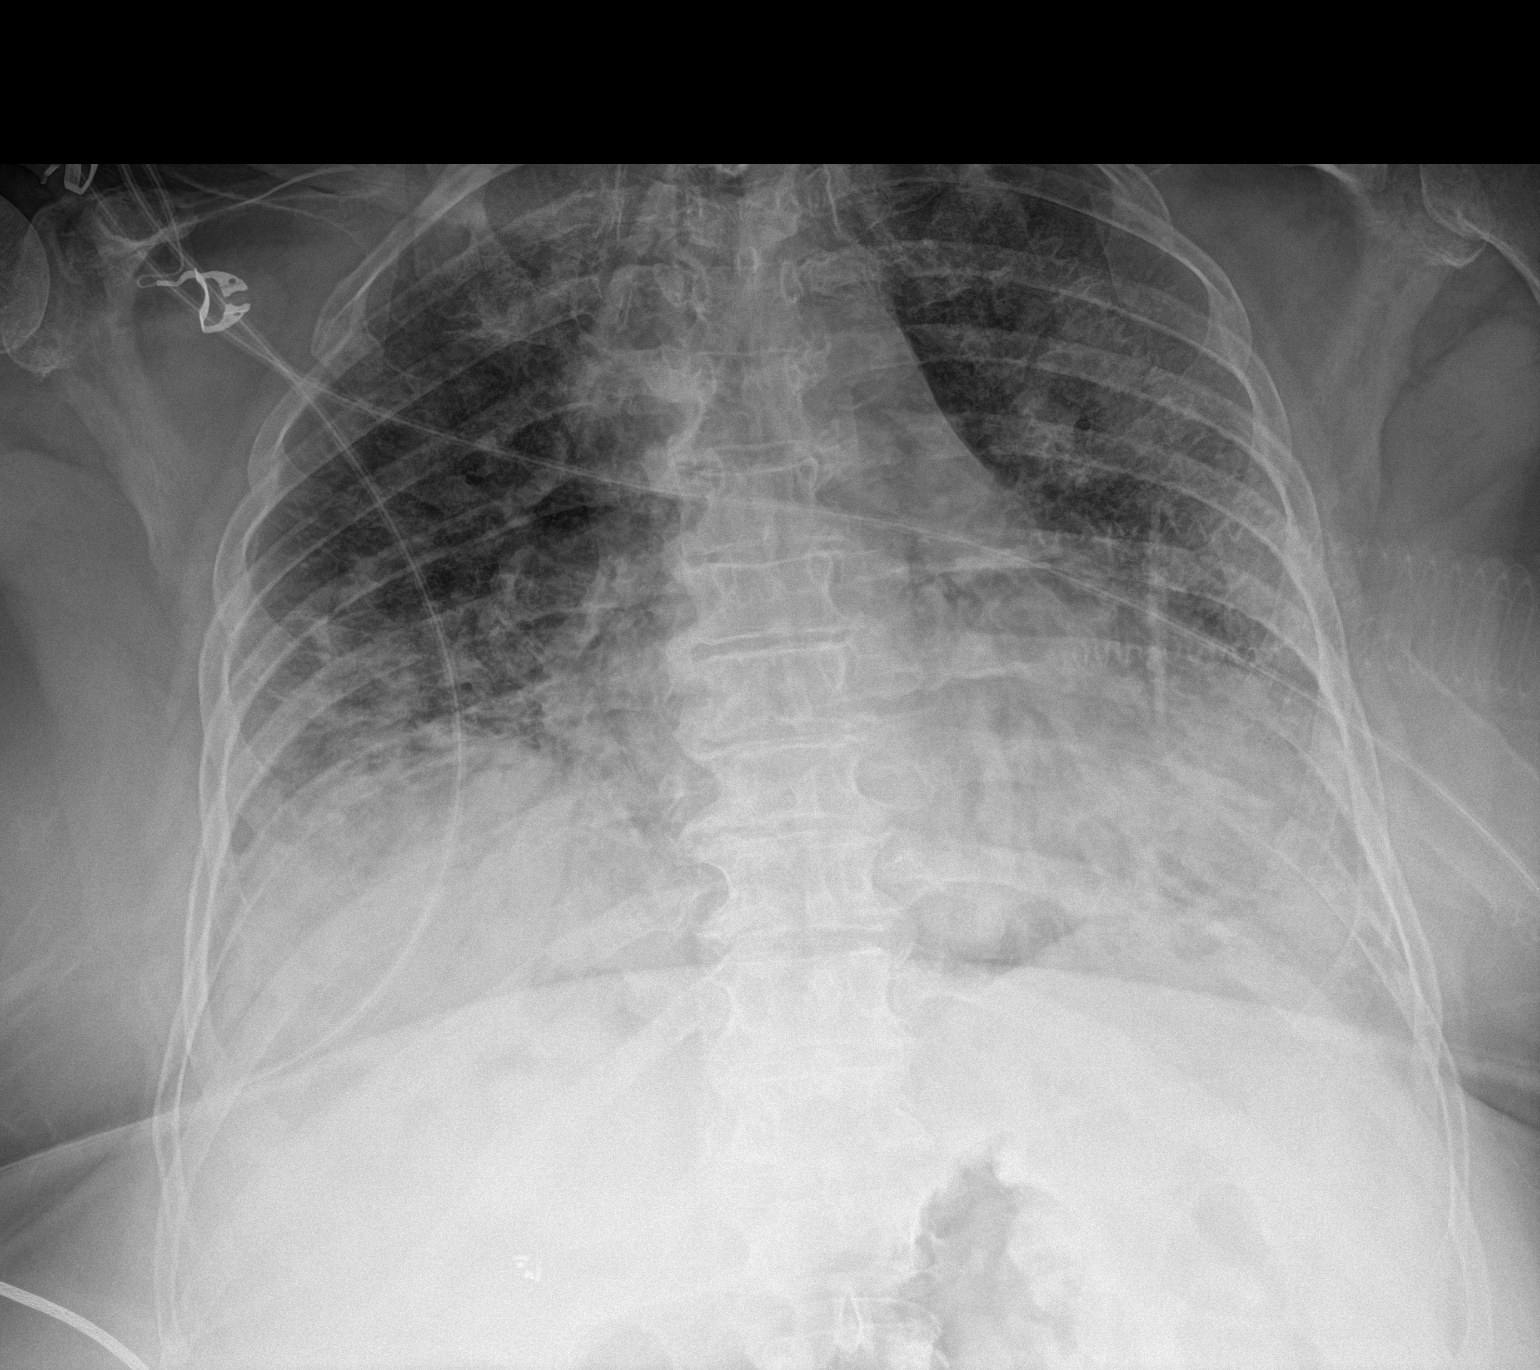

[1 of 1 positions shown; findings below may reference images not displayed]

FINDINGS: Stable cardiomediastinal silhouette. No pneumothorax is noted.
Mildly decreased bilateral lung opacities are noted suggesting
improving pneumonia. Minimal pleural effusions may be present. Bony
thorax is unremarkable.
IMPRESSION: Mildly improved bilateral pneumonia.

## 2020-03-20 NOTE — Progress Notes (Signed)
[facility]    Provider:  Dr Jaynee Eagles Requesting Provider: Vicenta Aly, [place] Primary Care Provider:  Vicenta Aly, FNP  CC:  Memory changes since covid 06/2018  HPI:  [name] [name] is a 70 y.o. female here as requested by Vicenta Aly, [place] for memory changes. PMHx cardiovascular disease, hypertension, GERD, endometrial cancer, hyperlipidemia, morbid obesity with alveolar hypoventilation and obstructive sleep apnea on CPAP, nonalcoholic fatty liver disease without nonalcoholic steatohepatitis (NASH), type 2 diabetes.  I reviewed [name] [name] notes: Patient reports memory changes are started post Covid from 2019, she presented to the ED with respiratory distress and hypoxia and was admitted in May 6 May 19, she has significant hypoxemia requiring high levels of oxygen, found to have bilateral infiltrates on chest x-ray consistent with secondary pneumonia, she was intubated, she was treated aggressive IV antibiotics and steroids, she continues to be somewhat weak although improving although her balance is off she is had a couple of falls by PT, feels like her brain can signal her legs properly, also feels like her brain is in a fog, cannot recall information easily, takes her longer to find words when talking to someone, she is no longer driving and she struggles to remember how to turn care off and drive as well as gets confused, she has not improved since discharge, cooking is limited she can no longer recall how she previously prepared foods, other family members handle household accounts and monies, often repeats her questions and forgets answers, she manages her own medications without difficulty.  I reviewed [name] [name]'s examination which showed normal head, ear, nose, mouth, tongue, eyes, neck, cardiovascular, musculoskeletal, pulmonary, chest, abdomen and normal neurologic exam except unable to perform heel-to-toe due to imbalance.  Mini-Mental status exam was  30 out of 30.   She lost her brother to Covid this year. She had May 2020, hospital for 2 weeks with significantly reduced oxygen levels, she has confusion since then, she got lost a few times and it scared her, her brain feels like a motor and its working when supposed to work. She feels there is disconnect between the brain and the body, can't get the signal to her legs, even in her dreams she gets lost and can;t find her way back home, she is confused when using phone, here with her son who also provides information, her dreams are so real, she uses her cpap consistently every night, she has had some falls. She lives with her husband and her son, son says she will get distracted easily since covid, she sometimes repeats the same questions in the same day, the son has always managed their finances, she cooks and cleans but she is easily distracted, she didn't know her grandparents, her parents did no thave dementia mother died at 77 and father late 82s, she has a sister and no memory issues, more "brain fog", she will be talking and then totally forget what she is aying, no short or long term memory loss more difficulty with tasks, directions, multi-tasking, also a little "wobbly" and perceived difficulty with getting her motor commands from the brain to the legs. Words get stuck, too many thought sin the brain difficult to get one out, sleep pattern is impacted she may not sleep some nights. She worries a lot. No other focal neurologic deficits, associated symptoms, inciting events or modifiable factors.  Reviewed notes, labs and imaging from outside physicians, which showed:  I requested recent labs from patient, I called primary care's office  and discussed that he needed most recent lab tests which included ferritin of 21, magnesium 1.8, B12 405, CBC unremarkable, hemoglobin A1c 7.4 which appears stable over the last year, CMP showed elevated glucose 146, BUN 25, creatinine 0.98, GFR non-African-American  slightly reduced at 59, otherwise normal all labs were collected December 23, 2019.  CT head 02/14/2019: FINDINGS: Brain: No evidence of acute territorial infarction, hemorrhage, hydrocephalus,extra-axial collection or mass lesion/mass effect. There is mild dilatation the ventricles and sulci consistent with age-related atrophy. Low-attenuation changes in the deep white matter consistent with small vessel ischemia.  Vascular: No hyperdense vessel or unexpected calcification.  Skull: The skull is intact. No fracture or focal lesion identified.  Sinuses/Orbits: The visualized paranasal sinuses and mastoid air cells are clear. The orbits and globes intact.  Other: None  Face:  Osseous: No acute fracture or other significant osseous abnormality.The nasal bone, mandibles, zygomatic arches and pterygoid plates are intact.  Orbits: No fracture identified. Unremarkable appearance of globes and orbits.  Sinuses: The visualized paranasal sinuses and mastoid air cells are unremarkable.  Soft tissues: Soft tissue swelling seen over the nasal bridge.  Limited intracranial: No acute findings.  Cervical spine:  Alignment: There is straightening of the normal cervical lordosis.  Skull base and vertebrae: Visualized skull base is intact. No atlanto-occipital dissociation. The vertebral body heights are well maintained. No fracture or pathologic osseous lesion seen.  Soft tissues and spinal canal: The visualized paraspinal soft tissues are unremarkable. No prevertebral soft tissue swelling is seen. The spinal canal is grossly unremarkable, no large epidural collection or significant canal narrowing.  Disc levels: Cervical spine spondylosis is seen most notable at C5-C6 with disc osteophyte complex and uncovertebral osteophytes which causes moderate to severe bilateral neural foraminal narrowing and mild central canal stenosis.  Upper chest: The lung apices are clear.  Thoracic inlet is within normal limits.  Other: None  IMPRESSION: 1.  No acute intracranial abnormality. 2. Findings consistent with mild age related atrophy and chronic small vessel ischemia 3.  No acute fracture or malalignment of the spine. 4. No acute facial fracture 5. Mild soft tissue swelling seen over the nasal bridge 6. Cervical spine spondylosis most notable C5-C6.  Review of Systems: Patient complains of symptoms per HPI as well as the following symptoms: memory changes. Pertinent negatives and positives per HPI. All others negative.   Social History   Socioeconomic History  . Marital status: Married    Spouse name: [name] [name]  . Number of children: 3  . Years of education: Not on file  . Highest education level: 11th grade  Occupational History  . Occupation: housewife  Tobacco Use  . Smoking status: Never Smoker  . Smokeless tobacco: Never Used  Vaping Use  . Vaping Use: Never used  Substance and Sexual Activity  . Alcohol use: Never  . Drug use: Never  . Sexual activity: Not on file  Other Topics Concern  . Not on file  Social History Narrative   Lives at home with husband and son   Right handed   Caffeine: coffee 2 cups/day   Social Determinants of Health   Financial Resource Strain: Not on file  Food Insecurity: Not on file  Transportation Needs: Not on file  Physical Activity: Not on file  Stress: Not on file  Social Connections: Not on file  Intimate Partner Violence: Not on file    Family History  Problem Relation Age of Onset  . Heart disease Father 32  CABG  . Lung cancer Father 36       lung  . Hypertension Father   . Diabetes Father   . High Cholesterol Father   . Diabetes type II Father   . Heart disease Sister 45       CABG  . Diabetes Sister 48  . Heart disease Brother 32       MI with stents  . Hypertension Mother   . Diabetes Mother   . High Cholesterol Mother   . Sudden death Mother   . Thyroid disease  Mother   . Obesity Mother   . Hypertension Sister        x2   . Diabetes Sister   . Hypertension Brother   . Diabetes Brother   . Cancer Sister        high grade Neuroendocrine carcinoma right axilla  . Pancreatic cancer Other        x 3 - 2 MAunts and 1 P Aunts.   . Lupus Other        Pat Aunt  . Dementia Neg Hx   . Alzheimer's disease Neg Hx     Past Medical History:  Diagnosis Date  . Anxiety   . Carpal tunnel syndrome of right wrist   . Coronary artery disease    per cath 11-29-2013  mild diffuse CAD and calcification in proximal LAD  . Depression   . Diverticulosis   . Essential hypertension   . GERD (gastroesophageal reflux disease)   . Heart disease   . History of endometrial cancer 05/2003   FIGO, Grade 1  s/p  TAG w/ BSO  . HTN (hypertension)   . Hyperlipidemia   . OA (osteoarthritis)   . OSA on CPAP    followed by dr Elsworth Soho-- per study 03-26-2012  mod. to severe OSA , AHI 37/hr  . Type II diabetes mellitus ([place])    followed by pcp  . Urinary incontinence in female   . Vitamin D deficiency     Patient Active Problem List   Diagnosis Date Noted  . Coronary artery disease 01/18/2019  . Class 3 severe obesity with serious comorbidity and body mass index (BMI) of 40.0 to 44.9 in adult ([place]) 10/13/2018  . DM (diabetes mellitus), type 2 ([place]) 06/30/2018  . Essential hypertension 06/30/2018  . Right carpal tunnel syndrome 07/17/2017  . Other fatigue 04/29/2017  . Shortness of breath on exertion 04/29/2017  . Vitamin D deficiency 04/29/2017  . Hypersomnolence 06/04/2016  . Nonalcoholic fatty liver disease without nonalcoholic steatohepatitis (NASH) 02/12/2016  . Gastroesophageal reflux disease with esophagitis 11/23/2014  . History of endometrial cancer 08/25/2014  . Mixed hyperlipidemia 08/25/2014  . Morbid obesity with alveolar hypoventilation ([place]) 08/25/2014  . Bilateral carotid bruits 11/30/2013  . Dyspnea on exertion 11/30/2013  . Angina pectoris ([place])  11/29/2013  . Osteoarthritis of right hip 09/02/2013  . Cystocele 07/03/2013  . Vaginal pessary present 04/25/2013  . Prolapse of vaginal vault after hysterectomy 04/25/2013  . Endometrial cancer, grade I ([place]) 04/25/2013  . Unspecified vitamin D deficiency 04/25/2013  . Personal history of other medical treatment 04/25/2013  . Osteoarthritis of left knee 08/02/2012  . Stress incontinence 06/07/2012  . Essential hypertension with goal blood pressure less than 130/85 06/07/2012  . Diabetes ([place]) 06/07/2012  . Obstructive sleep apnea 02/16/2012    Past Surgical History:  Procedure Laterality Date  . CARDIAC CATHETERIZATION  11/29/2013   dr Einar Gip   mild diffuse noncritial CAD and  mild calcification involving the proximal LAD,  LVEF 55-60%  . CARPAL TUNNEL RELEASE Right 07/17/2017   Procedure: CARPAL TUNNEL RELEASE;  Surgeon: Jodi GeraldsGraves, John, MD;  [facility];  Service: Orthopedics;  Laterality: Right;  . CHOLECYSTECTOMY    . COLONOSCOPY WITH PROPOFOL N/A 01/31/2020   Procedure: COLONOSCOPY WITH PROPOFOL;  Surgeon: Charna ElizabethMann, Jyothi, MD;  Location: [facility];  Service: Endoscopy;  Laterality: N/A;  . CORONARY ATHERECTOMY N/A 01/18/2019   Procedure: CORONARY ATHERECTOMY;  Surgeon: Yates DecampGanji, Jay, MD;  Location: [facility];  Service: Cardiovascular;  Laterality: N/A;  . CORONARY STENT INTERVENTION N/A 01/18/2019   Procedure: CORONARY STENT INTERVENTION;  Surgeon: Yates DecampGanji, Jay, MD;  Location: [facility];  Service: Cardiovascular;  Laterality: N/A;  . KNEE ARTHROSCOPY Left 10/ 2004;  05-17-2008   dr graves  . KNEE ARTHROSCOPY Right 11/2012  . LAPAROSCOPIC CHOLECYSTECTOMY  11-19-2006  [facility]  . LEFT HEART CATH AND CORONARY ANGIOGRAPHY N/A 01/18/2019   Procedure: LEFT HEART CATH AND CORONARY ANGIOGRAPHY;  Surgeon: Yates DecampGanji, Jay, MD;  Location: [facility];  Service: Cardiovascular;  Laterality: N/A;  . LEFT HEART CATHETERIZATION WITH CORONARY  ANGIOGRAM N/A 11/29/2013   Procedure: LEFT HEART CATHETERIZATION WITH CORONARY ANGIOGRAM;  Surgeon: Pamella PertJagadeesh R Ganji, MD;  [facility];  Service: Cardiovascular;  Laterality: N/A;  . TOTAL ABDOMINAL HYSTERECTOMY W/ BILATERAL SALPINGOOPHORECTOMY  06-20-2003    dr Maggie [facility]bel. [facility]   endometrial cancer  . TOTAL HIP ARTHROPLASTY Right 09/02/2013   Procedure: RIGHT TOTAL HIP ARTHROPLASTY ANTERIOR APPROACH;  Surgeon: Harvie JuniorJohn L Graves, MD;  Location: [facility];  Service: Orthopedics;  Laterality: Right;  . TOTAL KNEE ARTHROPLASTY Left 08/02/2012   Procedure: TOTAL KNEE ARTHROPLASTY;  Surgeon: Harvie JuniorJohn L Graves, MD;  Location: [facility];  Service: Orthopedics;  Laterality: Left;  left total knee arthroplasty  . TUBAL LIGATION  yrs ago    Current Outpatient Medications  Medication Sig Dispense Refill  . ACCU-CHEK AVIVA PLUS test strip U UTD  5  . ACCU-CHEK SOFTCLIX LANCETS lancets U UTD  11  . acetaminophen (TYLENOL) 500 MG tablet Take 1,000-1,500 mg by mouth 2 (two) times daily as needed (pain).    Marland Kitchen. atorvastatin (LIPITOR) 80 MG tablet Take 80 mg by mouth every evening.   5  . bisoprolol (ZEBETA) 10 MG tablet TAKE 1 TABLET BY MOUTH EVERY DAY (Patient taking differently: Take 10 mg by mouth daily.) 90 tablet 1  . cetirizine (ZYRTEC) 10 MG tablet Take 10 mg by mouth at bedtime.     . chlorthalidone (HYGROTON) 25 MG tablet Take 1 tablet (25 mg total) by mouth in the morning for 180 doses. 90 tablet 1  . Cholecalciferol (VITAMIN D) 50 MCG (2000 UT) tablet Take 2,000 Units by mouth daily.    . clopidogrel (PLAVIX) 75 MG tablet Take 75 mg by mouth daily.    Marland Kitchen. escitalopram (LEXAPRO) 20 MG tablet Take 20 mg by mouth every evening.     Marland Kitchen. glimepiride (AMARYL) 4 MG tablet Take 4 mg by mouth daily with breakfast.     . losartan (COZAAR) 100 MG tablet Take 1 tablet (100 mg total) by mouth daily. 90 tablet 1  . MAGNESIUM-OXIDE 400 (241.3 Mg) MG tablet Take 400 mg by mouth 3 (three) times daily.     . meloxicam (MOBIC) 15  MG tablet Take 15 mg by mouth daily.    . metFORMIN (GLUCOPHAGE) 1000 MG tablet Take 1 tablet (1,000 mg  total) by mouth 2 (two) times daily with a meal. 60 tablet 0  . Misc Natural Products (LUTEIN 20 PO) Take 20 mg by mouth daily.    . Multiple Vitamin (MULTIVITAMIN WITH MINERALS) TABS tablet Take 1 tablet by mouth daily.    . nitroGLYCERIN (NITROSTAT) 0.4 MG SL tablet PLACE 1 TABLET UNDER THE TONGUE EVERY 5 MINUTES AS NEEDED FOR CHEST PAIN 25 tablet 3  . omeprazole (PRILOSEC) 20 MG capsule Take 20 mg by mouth daily.    Marland Kitchen REPATHA SURECLICK 644 MG/ML SOAJ ADMINISTER 1 ML UNDER THE SKIN EVERY 14 DAYS (Patient taking differently: Inject 140 mg into the skin every 14 (fourteen) days.) 2 mL 5  . TRULICITY 3 IH/4.7QQ SOPN Inject 3 mg into the skin every Friday.     Marland Kitchen VASCEPA 1 g capsule Take 2 capsules (2 g total) by mouth 2 (two) times daily. 120 capsule 5   No current facility-administered medications for this visit.    Allergies as of 03/21/2020 - Review Complete 03/21/2020  Allergen Reaction Noted  . Codeine Palpitations 02/16/2012    Vitals: BP (!) 142/68 (BP Location: Right Arm, Patient Position: Sitting)   Pulse 69   Ht 5\' 4"  (1.626 m)   Wt 254 lb (115.2 kg)   LMP 11/25/2002 (Approximate)   SpO2 95%   BMI 43.60 kg/m  Last Weight:  Wt Readings from Last 1 Encounters:  03/21/20 254 lb (115.2 kg)   Last Height:   Ht Readings from Last 1 Encounters:  03/21/20 5\' 4"  (1.626 m)     Physical exam: Exam: Gen: NAD, conversant, well nourised, obese, well groomed                     CV: RRR, no MRG. No Carotid Bruits. No peripheral edema, warm, nontender Eyes: Conjunctivae clear without exudates or hemorrhage  Neuro: Detailed Neurologic Exam  Speech:    Speech is normal; fluent and spontaneous with normal comprehension.  Cognition:  MMSE - Mini Mental State Exam 03/21/2020  Orientation to time 4  Orientation to Place 5  Registration 3  Attention/ Calculation 4  Recall 3   Language- name 2 objects 2  Language- repeat 1  Language- follow 3 step command 3  Language- read & follow direction 1  Write a sentence 1  Copy design 1  Total score 28       The patient is oriented to person, place, and time;     recent and remote memory intact;     language fluent;     normal attention, concentration,     fund of knowledge Cranial Nerves:    The pupils are equal, round, and reactive to light. The fundi areflat. Visual fields are full to finger confrontation. Extraocular movements are intact. Trigeminal sensation is intact and the muscles of mastication are normal. Right eyelid retraction otherwise face is symmetric. The palate elevates in the midline. Hearing intact. Voice is normal. Shoulder shrug is normal. The tongue has normal motion without fasciculations.   Coordination:    Normal finger to nose  Gait:    Imbalance with heel/toe, tandem, slow cautious gait, good stride and arm swing   Motor Observation:    No asymmetry, no atrophy, and no involuntary movements noted. Tone:    Normal muscle tone.    Posture:    Posture is normal. normal erect    Strength: Hip flexion impaired may be due to large body habitus otherwise strength is V/V in the  upper and lower limbs.      Sensation: intact to LT     Reflex Exam:  DTR's:    Deep tendon reflexes in the upper and lower extremities are normal bilaterally.   Toes:    The toes are downgoing bilaterally.   Clonus:    Clonus is absent.    Assessment/Plan: Really lovely 70 y.o. female here as requested by Vicenta Aly, [place] for memory changes. PMHx cardiovascular disease, hypertension, GERD, endometrial cancer, hyperlipidemia, morbid obesity with alveolar hypoventilation and obstructive sleep apnea on CPAP, nonalcoholic fatty liver disease without nonalcoholic steatohepatitis (NASH), type 2 diabetes.  I suspect likely due to inactivity, anxiety, other medical conditions however given her age,  significant hypoxemia in the setting of covid and concerning symptoms such as getting lost (she is not driving due to this) she needs a thorough evaluation   Imbalance and falls: Physical Therapy: In [place] otherwise [place] Blood work MRI Risk analyst Will see if [place] has speech/cognitive therapy or [place]  Discussed:  Recommendations to prevent or slow progression of cognitive decline:   Exercise You should increase exercise 30 to 45 minutes per day at least 3 days a week although 5 to 7 would be preferred. Any type of exercise (including walking) is acceptable although a recumbent bicycle may be best if you are unsteady. Disease related apathy can be a significant roadblock to exercise and the only way to overcome this is to make it a daily routine and perhaps have a reward at the end (something your loved one loves to eat or drink perhaps) or a personal trainer coming to the home can also be very useful. In general a structured, repetitive schedule is best.   Cardiovascular Health: You should optimize all cardiovascular risk factors (blood pressure, sugar, cholesterol) as vascular disease such as strokes and heart attacks can make memory problems much worse.   Diet: Eating a heart healthy (Mediterranean) diet is also a good idea; fish and poultry instead of red meat, nuts (mostly non-peanuts), vegetables, fruits, olive oil or canola oil (instead of butter), minimal salt (use other spices to flavor foods), whole grain rice, bread, cereal and pasta and wine in moderation.  General Health: Any diseases which effect your body will effect your brain such as a pneumonia, urinary infection, blood clot, heart attack or stroke. Keep contact with your primary care doctor for regular follow ups.  Sleep. A good nights sleep is healthy for the brain. Seven hours is recommended. If you have insomnia or poor sleep habits see the  recommendations below  Tips: Structured and consistent daytime and nighttime routine, including regular wake times, bedtimes, and mealtimes, will be important for the patient to avoid confusion. Keeping frequently used items in designated places will help reduce stress from searching. If there are worries about getting lost do not let the patient leave home unaccompanied. They might benefit from wearing an identification bracelet that will help others assist in finding home if they become lost. Information about nationwide safe return services and other helpful resources may be obtained through the [employer] helpline at 1800-(956)635-4287.   Orders Placed This Encounter  Procedures  . MR BRAIN W WO CONTRAST  . Vitamin B1  . Homocysteine  . Basic Metabolic Panel  . TSH  . B12 and Folate Panel  . Methylmalonic acid, serum  . Ambulatory referral to Neuropsychology  . Ambulatory referral to Speech Therapy  . Ambulatory referral to Physical  Therapy   No orders of the defined types were placed in this encounter.   Cc: Vicenta Aly, [place],  Vicenta Aly, [place], [facility] [address] [place],  [address]  Phone [phone] Fax [phone]

## 2020-03-21 ENCOUNTER — Encounter: Payer: Self-pay | Admitting: *Deleted

## 2020-03-21 ENCOUNTER — Encounter: Payer: Self-pay | Admitting: Neurology

## 2020-03-21 ENCOUNTER — Telehealth: Payer: Self-pay | Admitting: Neurology

## 2020-03-21 ENCOUNTER — Ambulatory Visit (INDEPENDENT_AMBULATORY_CARE_PROVIDER_SITE_OTHER): Payer: Medicare Other | Admitting: Neurology

## 2020-03-21 VITALS — BP 142/68 | HR 69 | Ht 64.0 in | Wt 254.0 lb

## 2020-03-21 DIAGNOSIS — R5383 Other fatigue: Secondary | ICD-10-CM

## 2020-03-21 DIAGNOSIS — R4189 Other symptoms and signs involving cognitive functions and awareness: Secondary | ICD-10-CM | POA: Diagnosis not present

## 2020-03-21 DIAGNOSIS — W19XXXA Unspecified fall, initial encounter: Secondary | ICD-10-CM

## 2020-03-21 DIAGNOSIS — R4701 Aphasia: Secondary | ICD-10-CM

## 2020-03-21 DIAGNOSIS — R413 Other amnesia: Secondary | ICD-10-CM | POA: Diagnosis not present

## 2020-03-21 DIAGNOSIS — R269 Unspecified abnormalities of gait and mobility: Secondary | ICD-10-CM

## 2020-03-21 NOTE — Telephone Encounter (Signed)
Medicare/medicaid order sent to GI. No auth they will reach out the patient to schedule.

## 2020-03-21 NOTE — Patient Instructions (Signed)
Blood work MRI Surveyor, mining Physical Therapy Speech/Cognitive Therapy  Recommendations to prevent or slow progression of cognitive decline:   Exercise You should increase exercise 30 to 45 minutes per day at least 3 days a week although 5 to 7 would be preferred. Any type of exercise (including walking) is acceptable although a recumbent bicycle may be best if you are unsteady. Disease related apathy can be a significant roadblock to exercise and the only way to overcome this is to make it a daily routine and perhaps have a reward at the end (something your loved one loves to eat or drink perhaps) or a personal trainer coming to the home can also be very useful. In general a structured, repetitive schedule is best.   Cardiovascular Health: You should optimize all cardiovascular risk factors (blood pressure, sugar, cholesterol) as vascular disease such as strokes and heart attacks can make memory problems much worse.   Diet: Eating a heart healthy (Mediterranean) diet is also a good idea; fish and poultry instead of red meat, nuts (mostly non-peanuts), vegetables, fruits, olive oil or canola oil (instead of butter), minimal salt (use other spices to flavor foods), whole grain rice, bread, cereal and pasta and wine in moderation.  General Health: Any diseases which effect your body will effect your brain such as a pneumonia, urinary infection, blood clot, heart attack or stroke. Keep contact with your primary care doctor for regular follow ups.  Sleep. A good nights sleep is healthy for the brain. Seven hours is recommended. If you have insomnia or poor sleep habits see the recommendations below  Tips: Structured and consistent daytime and nighttime routine, including regular wake times, bedtimes, and mealtimes, will be important for the patient to avoid confusion. Keeping frequently used items in designated places will help reduce stress from searching. If there are worries about  getting lost do not let the patient leave home unaccompanied. They might benefit from wearing an identification bracelet that will help others assist in finding home if they become lost. Information about nationwide safe return services and other helpful resources may be obtained through the [employer] helpline at [phone].

## 2020-03-22 ENCOUNTER — Encounter: Payer: Self-pay | Admitting: Counselor

## 2020-03-30 ENCOUNTER — Other Ambulatory Visit: Payer: Self-pay | Admitting: Cardiology

## 2020-03-30 LAB — TSH: TSH: 1.96 u[IU]/mL (ref 0.450–4.500)

## 2020-03-30 LAB — B12 AND FOLATE PANEL
Folate: 13.7 ng/mL (ref >3.0)
Vitamin B-12: 347 pg/mL (ref 232–1245)

## 2020-03-30 LAB — VITAMIN B1: Thiamine: 145.6 nmol/L (ref 66.5–200.0)

## 2020-03-30 LAB — BASIC METABOLIC PANEL
BUN/Creatinine Ratio: 24 (ref 12–28)
BUN: 22 mg/dL (ref 8–27)
CO2: 24 mmol/L (ref 20–29)
Calcium: 9.8 mg/dL (ref 8.7–10.3)
Chloride: 98 mmol/L (ref 96–106)
Creatinine, Ser: 0.9 mg/dL (ref 0.57–1.00)
GFR calc Af Amer: 76 mL/min/{1.73_m2} (ref >59)
GFR calc non Af Amer: 66 mL/min/{1.73_m2} (ref >59)
Glucose: 169 mg/dL — ABNORMAL HIGH (ref 65–99)
Potassium: 4.7 mmol/L (ref 3.5–5.2)
Sodium: 140 mmol/L (ref 134–144)

## 2020-03-30 LAB — HOMOCYSTEINE: Homocysteine: 11.4 umol/L (ref 0.0–17.2)

## 2020-03-30 LAB — METHYLMALONIC ACID, SERUM: Methylmalonic Acid: 226 nmol/L (ref 0–378)

## 2020-04-02 ENCOUNTER — Ambulatory Visit (HOSPITAL_COMMUNITY): Payer: Medicare Other | Admitting: Speech Pathology

## 2020-04-03 ENCOUNTER — Telehealth: Payer: Self-pay

## 2020-04-03 ENCOUNTER — Other Ambulatory Visit: Payer: Self-pay | Admitting: Cardiology

## 2020-04-03 NOTE — Telephone Encounter (Signed)
Insurance has denied her repatha , they may cover praluent can I change it?

## 2020-04-03 NOTE — Telephone Encounter (Signed)
Yes

## 2020-04-04 ENCOUNTER — Ambulatory Visit
Admission: RE | Admit: 2020-04-04 | Discharge: 2020-04-04 | Disposition: A | Discharge status: Home / Self Care | Payer: Medicare Other | Source: Ambulatory Visit | Attending: Neurology | Admitting: Neurology

## 2020-04-04 ENCOUNTER — Other Ambulatory Visit: Payer: Self-pay

## 2020-04-04 DIAGNOSIS — R4189 Other symptoms and signs involving cognitive functions and awareness: Secondary | ICD-10-CM

## 2020-04-04 DIAGNOSIS — R4701 Aphasia: Secondary | ICD-10-CM

## 2020-04-04 DIAGNOSIS — R5383 Other fatigue: Secondary | ICD-10-CM

## 2020-04-04 DIAGNOSIS — R413 Other amnesia: Secondary | ICD-10-CM

## 2020-04-04 DIAGNOSIS — R269 Unspecified abnormalities of gait and mobility: Secondary | ICD-10-CM

## 2020-04-04 DIAGNOSIS — W19XXXA Unspecified fall, initial encounter: Secondary | ICD-10-CM

## 2020-04-04 MED ORDER — PRALUENT 150 MG/ML ~~LOC~~ SOAJ
1.0000 mL | SUBCUTANEOUS | 6 refills | Status: DC
Start: 2020-04-04 — End: 2020-06-14

## 2020-04-04 MED ORDER — GADOBENATE DIMEGLUMINE 529 MG/ML IV SOLN
20.0000 mL | Freq: Once | INTRAVENOUS | Status: AC | PRN
  Administered 2020-04-04: 20 mL via INTRAVENOUS

## 2020-04-11 ENCOUNTER — Other Ambulatory Visit: Payer: Self-pay | Admitting: Cardiology

## 2020-04-11 DIAGNOSIS — I2510 Atherosclerotic heart disease of native coronary artery without angina pectoris: Secondary | ICD-10-CM

## 2020-04-11 DIAGNOSIS — Z95820 Peripheral vascular angioplasty status with implants and grafts: Secondary | ICD-10-CM

## 2020-04-30 ENCOUNTER — Other Ambulatory Visit: Payer: Self-pay

## 2020-04-30 ENCOUNTER — Encounter: Payer: Self-pay | Admitting: Cardiology

## 2020-04-30 ENCOUNTER — Ambulatory Visit: Payer: Medicare Other | Admitting: Cardiology

## 2020-04-30 VITALS — BP 135/68 | HR 69 | Temp 97.8°F | Resp 16 | Ht 64.0 in | Wt 258.0 lb

## 2020-04-30 DIAGNOSIS — E782 Mixed hyperlipidemia: Secondary | ICD-10-CM

## 2020-04-30 DIAGNOSIS — R0602 Shortness of breath: Secondary | ICD-10-CM

## 2020-04-30 DIAGNOSIS — I2510 Atherosclerotic heart disease of native coronary artery without angina pectoris: Secondary | ICD-10-CM

## 2020-04-30 DIAGNOSIS — Z8616 Personal history of COVID-19: Secondary | ICD-10-CM

## 2020-04-30 DIAGNOSIS — Z95820 Peripheral vascular angioplasty status with implants and grafts: Secondary | ICD-10-CM

## 2020-04-30 DIAGNOSIS — I10 Essential (primary) hypertension: Secondary | ICD-10-CM

## 2020-04-30 DIAGNOSIS — E781 Pure hyperglyceridemia: Secondary | ICD-10-CM

## 2020-04-30 DIAGNOSIS — E119 Type 2 diabetes mellitus without complications: Secondary | ICD-10-CM

## 2020-04-30 NOTE — Progress Notes (Signed)
[name] [name] Date of Birth: 1950-12-08 MRN: [id number] [place], [name], [place] Former Cardiology Providers: Jeri Lager, APRN, FNP-C Primary Cardiologist: Rex Kras, DO, [facility] (established care 11/03/2019)  Date: 04/30/20 Last Office Visit: 11/03/2019  Chief Complaint  Patient presents with  . Coronary Artery Disease  . Hypertension  . Follow-up    HPI  [name] [name] is a 70 y.o.  female who presents to the office with a chief complaint of " 8-month follow-up on management of heart disease." Patient's past medical history and cardiovascular risk factors include: Diabetes mellitus type 2, morbid obesity, obstructive sleep apnea on CPAP, mixed hyperlipidemia, chronic palpitations with history of PACs and PVCs, CAD status post PCI to the LAD 01/18/2019, postmenopausal female, advanced age.  Patient presents for 62-month follow-up with regards to her underlying coronary artery disease with prior interventions and multiple cardiovascular risk factors.  Since last office visit patient states that she is doing well from a cardiovascular standpoint.  She denies any chest pain or anginal equivalent.  Since last visit we had decreased her chlorthalidone due to hypomagnesemia and uptitrated losartan.  In addition, her Repatha was no longer approved by her insurance company and therefore was transitioned to [employer].  And given her hypertriglyceridemia and coronary artery disease she was also initiated on Vascepa.  She was recommended to have repeat blood work to reevaluate lipid profile due to the change in PCSK9 inhibitors as well as the initiation of Vascepa.  However, patient forgot to do so.  Recently patient states that she went on a vacation and indulged into foods that she should not have been there for has been experiencing shortness of breath over the last couple days.  She states that with stricter salt restrictions in her diet and resuming her medications her  symptoms should resolve.  FUNCTIONAL STATUS: No structured exercise program or daily routine.   ALLERGIES: Allergies  Allergen Reactions  . Codeine Palpitations   MEDICATION LIST PRIOR TO VISIT: Current Outpatient Medications on File Prior to Visit  Medication Sig Dispense Refill  . ACCU-CHEK AVIVA PLUS test strip U UTD  5  . ACCU-CHEK SOFTCLIX LANCETS lancets U UTD  11  . acetaminophen (TYLENOL) 500 MG tablet Take 1,000-1,500 mg by mouth 2 (two) times daily as needed (pain).    . Alirocumab (PRALUENT) 150 MG/ML SOAJ Inject 1 mL into the skin every 14 (fourteen) days. 2 mL 6  . atorvastatin (LIPITOR) 80 MG tablet Take 80 mg by mouth every evening.   5  . bisoprolol (ZEBETA) 10 MG tablet TAKE 1 TABLET BY MOUTH EVERY DAY 90 tablet 1  . cetirizine (ZYRTEC) 10 MG tablet Take 10 mg by mouth at bedtime.     . chlorthalidone (HYGROTON) 25 MG tablet Take 1 tablet (25 mg total) by mouth in the morning for 180 doses. 90 tablet 1  . Cholecalciferol (VITAMIN D) 50 MCG (2000 UT) tablet Take 2,000 Units by mouth daily.    . clopidogrel (PLAVIX) 75 MG tablet Take 75 mg by mouth daily.    Marland Kitchen escitalopram (LEXAPRO) 20 MG tablet Take 20 mg by mouth every evening.     Marland Kitchen glimepiride (AMARYL) 4 MG tablet Take 4 mg by mouth daily with breakfast.     . losartan (COZAAR) 100 MG tablet Take 1 tablet (100 mg total) by mouth daily. 90 tablet 1  . MAGNESIUM-OXIDE 400 (241.3 Mg) MG tablet Take 400 mg by mouth 3 (three) times daily.     Marland Kitchen  meloxicam (MOBIC) 15 MG tablet Take 15 mg by mouth daily.    . metFORMIN (GLUCOPHAGE) 1000 MG tablet Take 1 tablet (1,000 mg total) by mouth 2 (two) times daily with a meal. 60 tablet 0  . Misc Natural Products (LUTEIN 20 PO) Take 20 mg by mouth daily.    . Multiple Vitamin (MULTIVITAMIN WITH MINERALS) TABS tablet Take 1 tablet by mouth daily.    . nitroGLYCERIN (NITROSTAT) 0.4 MG SL tablet PLACE 1 TABLET UNDER THE TONGUE EVERY 5 MINUTES AS NEEDED FOR CHEST PAIN 25 tablet 3  .  omeprazole (PRILOSEC) 20 MG capsule Take 20 mg by mouth daily.    Marland Kitchen spironolactone (ALDACTONE) 25 MG tablet Take 25 mg by mouth daily.    . TRULICITY 3 TI/4.5YK SOPN Inject 3 mg into the skin every Friday.     Marland Kitchen VASCEPA 1 g capsule TAKE 2 CAPSULES(2 GRAMS) BY MOUTH TWICE DAILY 120 capsule 5   No current facility-administered medications on file prior to visit.    PAST MEDICAL HISTORY: Past Medical History:  Diagnosis Date  . Anxiety   . Carpal tunnel syndrome of right wrist   . Coronary artery disease    per cath 11-29-2013  mild diffuse CAD and calcification in proximal LAD  . Depression   . Diverticulosis   . Essential hypertension   . GERD (gastroesophageal reflux disease)   . Heart disease   . History of endometrial cancer 05/2003   FIGO, Grade 1  s/p  TAG w/ BSO  . HTN (hypertension)   . Hyperlipidemia   . OA (osteoarthritis)   . OSA on CPAP    followed by dr Elsworth Soho-- per study 03-26-2012  mod. to severe OSA , AHI 37/hr  . Type II diabetes mellitus ([place])    followed by pcp  . Urinary incontinence in female   . Vitamin D deficiency     PAST SURGICAL HISTORY: Past Surgical History:  Procedure Laterality Date  . CARDIAC CATHETERIZATION  11/29/2013   dr Einar Gip   mild diffuse noncritial CAD and mild calcification involving the proximal LAD,  LVEF 55-60%  . CARPAL TUNNEL RELEASE Right 07/17/2017   Procedure: CARPAL TUNNEL RELEASE;  Surgeon: Dorna Leitz, MD;  Location: [place];  Service: Orthopedics;  Laterality: Right;  . CHOLECYSTECTOMY    . COLONOSCOPY WITH PROPOFOL N/A 01/31/2020   Procedure: COLONOSCOPY WITH PROPOFOL;  Surgeon: Juanita Craver, MD;  Location: [facility];  Service: Endoscopy;  Laterality: N/A;  . CORONARY ATHERECTOMY N/A 01/18/2019   Procedure: CORONARY ATHERECTOMY;  Surgeon: Adrian Prows, MD;  Location: [place] [facility];  Service: Cardiovascular;  Laterality: N/A;  . CORONARY STENT INTERVENTION N/A 01/18/2019   Procedure: CORONARY STENT  INTERVENTION;  Surgeon: Adrian Prows, MD;  Location: [place] [facility];  Service: Cardiovascular;  Laterality: N/A;  . KNEE ARTHROSCOPY Left 10/ 2004;  05-17-2008   dr graves  . KNEE ARTHROSCOPY Right 11/2012  . LAPAROSCOPIC CHOLECYSTECTOMY  11-19-2006  dr Zella Richer  [facility]  . LEFT HEART CATH AND CORONARY ANGIOGRAPHY N/A 01/18/2019   Procedure: LEFT HEART CATH AND CORONARY ANGIOGRAPHY;  Surgeon: Adrian Prows, MD;  Location: [place] [facility];  Service: Cardiovascular;  Laterality: N/A;  . LEFT HEART CATHETERIZATION WITH CORONARY ANGIOGRAM N/A 11/29/2013   Procedure: LEFT HEART CATHETERIZATION WITH CORONARY ANGIOGRAM;  Surgeon: Laverda Page, MD;  Location: [facility] LAB;  Service: Cardiovascular;  Laterality: N/A;  . TOTAL ABDOMINAL HYSTERECTOMY W/ BILATERAL SALPINGOOPHORECTOMY  06-20-2003    dr  lTamala Julian  [facility]   endometrial cancer  . TOTAL HIP ARTHROPLASTY Right 09/02/2013   Procedure: RIGHT TOTAL HIP ARTHROPLASTY ANTERIOR APPROACH;  Surgeon: Alta Corning, MD;  Location: [place];  Service: Orthopedics;  Laterality: Right;  . TOTAL KNEE ARTHROPLASTY Left 08/02/2012   Procedure: TOTAL KNEE ARTHROPLASTY;  Surgeon: Alta Corning, MD;  Location: [place];  Service: Orthopedics;  Laterality: Left;  left total knee arthroplasty  . TUBAL LIGATION  yrs ago    FAMILY HISTORY: The patient's family history includes Cancer in her sister; Diabetes in her brother, father, mother, and sister; Diabetes (age of onset: 16) in her sister; Diabetes type II in her father; Heart disease (age of onset: 27) in her brother and father; Heart disease (age of onset: 34) in her sister; High Cholesterol in her father and mother; Hypertension in her brother, father, mother, and sister; Lung cancer (age of onset: 50) in her father; Lupus in an other family member; Obesity in her mother; Pancreatic cancer in an other family member; Sudden death in her mother; Thyroid disease in her mother.   SOCIAL HISTORY:  The patient  reports that she  has never smoked. She has never used smokeless tobacco. She reports that she does not drink alcohol and does not use drugs.  Review of Systems  Constitutional: Negative for chills and fever.  HENT: Negative for hoarse voice and nosebleeds.   Eyes: Negative for discharge, double vision and pain.  Cardiovascular: Positive for dyspnea on exertion. Negative for chest pain, claudication, leg swelling, near-syncope, orthopnea, palpitations, paroxysmal nocturnal dyspnea and syncope.  Respiratory: Negative for hemoptysis and shortness of breath.   Musculoskeletal: Negative for muscle cramps and myalgias.  Gastrointestinal: Negative for abdominal pain, constipation, diarrhea, hematemesis, hematochezia, melena, nausea and vomiting.  Neurological: Negative for dizziness and light-headedness.    PHYSICAL EXAM: Vitals with BMI 04/30/2020 03/21/2020 01/31/2020  Height $Remov'5\' 4"'KzekDO$  $Remove'5\' 4"'PDHHAlb$  -  Weight 258 lbs 254 lbs -  BMI 86.57 84.69 -  Systolic 629 528 413  Diastolic 68 68 45  Pulse 69 69 63    CONSTITUTIONAL: Well-developed and well-nourished. No acute distress.  SKIN: Skin is warm and dry. No rash noted. No cyanosis. No pallor. No jaundice HEAD: Normocephalic and atraumatic.  EYES: No scleral icterus MOUTH/THROAT: Moist oral membranes.  NECK: No JVD present. No thyromegaly noted. No carotid bruits  LYMPHATIC: No visible cervical adenopathy.  CHEST Normal respiratory effort. No intercostal retractions  LUNGS: Clear to auscultation bilaterally.  No stridor. No wheezes. No rales.  CARDIOVASCULAR: Regular rate and rhythm, positive S1-S2, no murmurs rubs or gallops appreciated. ABDOMINAL: No apparent ascites.  EXTREMITIES: Trace bilateral peripheral edema, warm to touch. HEMATOLOGIC: No significant bruising NEUROLOGIC: Oriented to person, place, and time. Nonfocal. Normal muscle tone.  PSYCHIATRIC: Normal mood and affect. Normal behavior. Cooperative  CARDIAC DATABASE: EKG: 04/30/2020: Normal sinus rhythm,  consider old anterior septal infarct, without underlying injury pattern.    Echocardiogram: PCV ECHOCARDIOGRAM COMPLETE 11/16/2018 Echocardiogram 11/16/2018: Normal LV systolic function with EF 66%. Left ventricle cavity is normal in size. Moderate concentric hypertrophy of the left ventricle. Normal global wall motion. Doppler evidence of grade I (impaired) diastolic dysfunction, normal LAP. Calculated EF 66%. Left atrial cavity is moderately dilated at 4.5 cm. Right ventricle cavity is mildly dilated. Normal right ventricular function. Compared to the study done on 12/21/2013, no significant change.    Stress Testing:  PCV MYOCARDIAL PERFUSION WITH LEXISCAN 12/27/2018 Lexiscan Myoview Stress Test 12/27/2018: 1.  Resting  EKG normal sinus rhythm, stress EKG nondiagnostic due to pharmacologic stress. 2.  Left ventricle is dilated both in rest and stress images, LV end-diastolic volume 466 mL. Perfusion images reveal a small sized mild to moderate amount of ischemia in the apical inferior and periapical region.  LVEF elevated at 47%.  This is an intermediate risk study. No previous exam available for comparison.  Heart Catheterization: 01/18/2019: Normal LV systolic function, normal LVEDP. No pressure gradient across the aortic valve. RCA is dominant and has mild disease. Circumflex coronary artery is moderate-sized vessel with mild disease. LAD is a large-caliber vessel, with high-grade 95% proximal stenosis followed by tandem 70 to 80% stenosis. Moderate amount of calcification evident. Successful orbital atherectomy followed by stenting with 3.5 x 30 mm resolute Onyx at 12 atmospheric pressure for 60 seconds followed by postdilatation with 3.5 x 18 mm Zeba balloon at 16 atmospheric pressure x2. Stenosis reduced to 0% with TIMI-3 to TIMI-3 flow.  Carotid duplex: [12/27/2013]: No evidence of hemodynamically significant stenosis in the bilateral carotid bifurcation vessels.  LABORATORY  DATA: CBC Latest Ref Rng & Units 01/14/2019 07/09/2018 07/07/2018  WBC 3.4 - 10.8 x10E3/uL 7.9 15.7(H) 17.1(H)  Hemoglobin 11.1 - 15.9 g/dL 13.1 13.3 12.9  Hematocrit 34.0 - 46.6 % 39.6 41.4 38.7  Platelets 150 - 450 x10E3/uL 282 334 318    CMP Latest Ref Rng & Units 03/21/2020 11/15/2019 11/04/2019  Glucose 65 - 99 mg/dL 169(H) 143(H) 102(H)  BUN 8 - 27 mg/dL $Remove'22 24 21  'yxOjfzF$ Creatinine 0.57 - 1.00 mg/dL 0.90 0.91 0.95  Sodium 134 - 144 mmol/L 140 140 140  Potassium 3.5 - 5.2 mmol/L 4.7 4.7 4.4  Chloride 96 - 106 mmol/L 98 100 98  CO2 20 - 29 mmol/L $RemoveB'24 24 27  'aZejJYdu$ Calcium 8.7 - 10.3 mg/dL 9.8 10.1 9.9  Total Protein 6.0 - 8.5 g/dL - - 7.3  Total Bilirubin 0.0 - 1.2 mg/dL - - 0.4  Alkaline Phos 48 - 121 IU/L - - 63  AST 0 - 40 IU/L - - 20  ALT 0 - 32 IU/L - - 29    Lipid Panel     Component Value Date/Time   CHOL 101 11/15/2019 0923   TRIG 241 (H) 11/15/2019 0923   HDL 38 (L) 11/15/2019 0923   CHOLHDL 5.9 (H) 07/16/2015 1339   VLDL NOT CALC 07/16/2015 1339   LDLCALC 26 11/15/2019 0923   LDLDIRECT 53 11/04/2019 1202   LABVLDL 37 11/15/2019 0923    Lab Results  Component Value Date   HGBA1C 7.4 (H) 10/27/2018   HGBA1C 6.7 (H) 02/01/2018   HGBA1C 6.7 (H) 10/06/2017   No components found for: NTPROBNP Lab Results  Component Value Date   TSH 1.960 03/21/2020   TSH 2.300 10/27/2018   TSH 3.010 04/29/2017    Cardiac Panel (last 3 results) No results for input(s): CKTOTAL, CKMB, TROPONINIHS, RELINDX in the last 72 hours.  IMPRESSION:    ICD-10-CM   1. Coronary artery disease involving native coronary artery of native heart without angina pectoris  I25.10 EKG 12-Lead    CMP14+EGFR    LDL cholesterol, direct    Lipid Panel With LDL/HDL Ratio    Magnesium  2. Essential hypertension  I10   3. Hypertriglyceridemia  E78.1 Lipid Panel With LDL/HDL Ratio  4. Status post angioplasty with stent  Z95.820 Lipid Panel With LDL/HDL Ratio  5. Mixed hyperlipidemia  E78.2 LDL cholesterol,  direct    Lipid Panel With LDL/HDL Ratio  6.  Type 2 diabetes mellitus without complication, without long-term current use of insulin (HCC)  E11.9   7. History of COVID-19  Z86.16   8. Class 3 severe obesity with serious comorbidity and body mass index (BMI) of 40.0 to 44.9 in adult, unspecified obesity type ([place])  E66.01    Z68.41   9. Shortness of breath  R06.02 Pro b natriuretic peptide (BNP)     RECOMMENDATIONS: [name] [name] is a 70 y.o. female whose past medical history and cardiovascular risk factors include: Diabetes mellitus type 2, morbid obesity, obstructive sleep apnea on CPAP, mixed hyperlipidemia, chronic palpitations with history of PACs and PVCs, CAD status post PCI to the LAD 01/18/2019, postmenopausal female, advanced age.  Established coronary artery disease status post PCI to the LAD without angina pectoris:  No chest pain or angina pectoris.  Continue current medical therapy.  Most recent echocardiogram and nuclear stress test results reviewed during today's encounter.  Independently reviewed labs from January 2022.  Repeat blood work to evaluate fasting lipid profile, CMP, magnesium level.    EKG performed and reviewed results noted above.  Shortness of breath:  Most likely secondary to increased salt in her diet due to her recent vacation.  Patient will call the office back if her symptoms do not improve in the next week.  In the meantime we will also check a BNP as a part of her lab work  Benign essential hypertension:  Office blood pressure is within acceptable range.  Recent blood work from January 2022 independently reviewed.  Continue principal care management for ambulatory blood pressure monitoring.  Blood pressure log reviewed.    Low-salt diet recommended.  Mixed hyperlipidemia: Continue statin therapy and Praluent.  Check fasting lipid profile and direct LDL.  FINAL MEDICATION LIST END OF ENCOUNTER: No orders of the defined types  were placed in this encounter.    Current Outpatient Medications:  .  ACCU-CHEK AVIVA PLUS test strip, U UTD, Disp: , Rfl: 5 .  ACCU-CHEK SOFTCLIX LANCETS lancets, U UTD, Disp: , Rfl: 11 .  acetaminophen (TYLENOL) 500 MG tablet, Take 1,000-1,500 mg by mouth 2 (two) times daily as needed (pain)., Disp: , Rfl:  .  Alirocumab (PRALUENT) 150 MG/ML SOAJ, Inject 1 mL into the skin every 14 (fourteen) days., Disp: 2 mL, Rfl: 6 .  atorvastatin (LIPITOR) 80 MG tablet, Take 80 mg by mouth every evening. , Disp: , Rfl: 5 .  bisoprolol (ZEBETA) 10 MG tablet, TAKE 1 TABLET BY MOUTH EVERY DAY, Disp: 90 tablet, Rfl: 1 .  cetirizine (ZYRTEC) 10 MG tablet, Take 10 mg by mouth at bedtime. , Disp: , Rfl:  .  chlorthalidone (HYGROTON) 25 MG tablet, Take 1 tablet (25 mg total) by mouth in the morning for 180 doses., Disp: 90 tablet, Rfl: 1 .  Cholecalciferol (VITAMIN D) 50 MCG (2000 UT) tablet, Take 2,000 Units by mouth daily., Disp: , Rfl:  .  clopidogrel (PLAVIX) 75 MG tablet, Take 75 mg by mouth daily., Disp: , Rfl:  .  escitalopram (LEXAPRO) 20 MG tablet, Take 20 mg by mouth every evening. , Disp: , Rfl:  .  glimepiride (AMARYL) 4 MG tablet, Take 4 mg by mouth daily with breakfast. , Disp: , Rfl:  .  losartan (COZAAR) 100 MG tablet, Take 1 tablet (100 mg total) by mouth daily., Disp: 90 tablet, Rfl: 1 .  MAGNESIUM-OXIDE 400 (241.3 Mg) MG tablet, Take 400 mg by mouth 3 (three) times daily. , Disp: , Rfl:  .  meloxicam (MOBIC) 15 MG tablet, Take 15 mg by mouth daily., Disp: , Rfl:  .  metFORMIN (GLUCOPHAGE) 1000 MG tablet, Take 1 tablet (1,000 mg total) by mouth 2 (two) times daily with a meal., Disp: 60 tablet, Rfl: 0 .  Misc Natural Products (LUTEIN 20 PO), Take 20 mg by mouth daily., Disp: , Rfl:  .  Multiple Vitamin (MULTIVITAMIN WITH MINERALS) TABS tablet, Take 1 tablet by mouth daily., Disp: , Rfl:  .  nitroGLYCERIN (NITROSTAT) 0.4 MG SL tablet, PLACE 1 TABLET UNDER THE TONGUE EVERY 5 MINUTES AS NEEDED FOR  CHEST PAIN, Disp: 25 tablet, Rfl: 3 .  omeprazole (PRILOSEC) 20 MG capsule, Take 20 mg by mouth daily., Disp: , Rfl:  .  spironolactone (ALDACTONE) 25 MG tablet, Take 25 mg by mouth daily., Disp: , Rfl:  .  TRULICITY 3 DS/2.8JG SOPN, Inject 3 mg into the skin every Friday. , Disp: , Rfl:  .  VASCEPA 1 g capsule, TAKE 2 CAPSULES(2 GRAMS) BY MOUTH TWICE DAILY, Disp: 120 capsule, Rfl: 5  Orders Placed This Encounter  Procedures  . CMP14+EGFR  . LDL cholesterol, direct  . Lipid Panel With LDL/HDL Ratio  . Magnesium  . Pro b natriuretic peptide (BNP)  . EKG 12-Lead   --Continue cardiac medications as reconciled in final medication list. --Return in about 6 months (around 10/31/2020) for Follow up, CAD, Lipid/TAG. Or sooner if needed. --Continue follow-up with your primary care physician regarding the management of your other chronic comorbid conditions.  Patient's questions and concerns were addressed to her satisfaction. She voices understanding of the instructions provided during this encounter.   This note was created using a voice recognition software as a result there may be grammatical errors inadvertently enclosed that do not reflect the nature of this encounter. Every attempt is made to correct such errors.  Rex Kras, [place], [facility]  Pager: [phone] Office: [phone]

## 2020-05-02 ENCOUNTER — Encounter: Payer: Self-pay | Admitting: Counselor

## 2020-05-02 ENCOUNTER — Ambulatory Visit (INDEPENDENT_AMBULATORY_CARE_PROVIDER_SITE_OTHER): Payer: Medicare Other | Admitting: Counselor

## 2020-05-02 ENCOUNTER — Other Ambulatory Visit: Payer: Self-pay

## 2020-05-02 ENCOUNTER — Ambulatory Visit: Payer: Medicare Other | Admitting: Psychology

## 2020-05-02 ENCOUNTER — Ambulatory Visit: Payer: Medicare Other | Admitting: Cardiology

## 2020-05-02 DIAGNOSIS — Z87898 Personal history of other specified conditions: Secondary | ICD-10-CM | POA: Diagnosis not present

## 2020-05-02 DIAGNOSIS — G3184 Mild cognitive impairment, so stated: Secondary | ICD-10-CM

## 2020-05-02 DIAGNOSIS — Z8616 Personal history of COVID-19: Secondary | ICD-10-CM

## 2020-05-02 DIAGNOSIS — F09 Unspecified mental disorder due to known physiological condition: Secondary | ICD-10-CM

## 2020-05-02 DIAGNOSIS — F0670 Mild neurocognitive disorder due to known physiological condition without behavioral disturbance: Secondary | ICD-10-CM

## 2020-05-02 LAB — LIPID PANEL WITH LDL/HDL RATIO
Cholesterol, Total: 91 mg/dL — ABNORMAL LOW (ref 100–199)
HDL: 39 mg/dL — ABNORMAL LOW (ref >39)
LDL Chol Calc (NIH): 12 mg/dL (ref 0–99)
LDL/HDL Ratio: 0.3 ratio (ref 0.0–3.2)
Triglycerides: 280 mg/dL — ABNORMAL HIGH (ref 0–149)
VLDL Cholesterol Cal: 40 mg/dL (ref 5–40)

## 2020-05-02 LAB — CMP14+EGFR
ALT: 35 IU/L — ABNORMAL HIGH (ref 0–32)
AST: 24 IU/L (ref 0–40)
Albumin/Globulin Ratio: 1.6 (ref 1.2–2.2)
Albumin: 4.2 g/dL (ref 3.8–4.8)
Alkaline Phosphatase: 60 IU/L (ref 44–121)
BUN/Creatinine Ratio: 17 (ref 12–28)
BUN: 17 mg/dL (ref 8–27)
Bilirubin Total: 0.3 mg/dL (ref 0.0–1.2)
CO2: 23 mmol/L (ref 20–29)
Calcium: 9.7 mg/dL (ref 8.7–10.3)
Chloride: 102 mmol/L (ref 96–106)
Creatinine, Ser: 1 mg/dL (ref 0.57–1.00)
Globulin, Total: 2.6 g/dL (ref 1.5–4.5)
Glucose: 134 mg/dL — ABNORMAL HIGH (ref 65–99)
Potassium: 5.8 mmol/L — ABNORMAL HIGH (ref 3.5–5.2)
Sodium: 143 mmol/L (ref 134–144)
Total Protein: 6.8 g/dL (ref 6.0–8.5)
eGFR: 61 mL/min/{1.73_m2} (ref >59)

## 2020-05-02 LAB — PRO B NATRIURETIC PEPTIDE: NT-Pro BNP: 182 pg/mL (ref 0–301)

## 2020-05-02 LAB — MAGNESIUM: Magnesium: 1.8 mg/dL (ref 1.6–2.3)

## 2020-05-02 LAB — LDL CHOLESTEROL, DIRECT: LDL Direct: 15 mg/dL (ref 0–99)

## 2020-05-02 NOTE — Progress Notes (Signed)
[facility]  Patient Name: [name] [name] MRN: [id number] Date of Birth: 12-01-1950 Age: 70 y.o. Education: 11 years  Referral Circumstances and Background Information  Ms. Asuka Dusseau is a 70 y.o., right-hand dominant, married woman with a history of COVID, HTN, DMII, HLD, CAD, endometrial cancer, and obesity. She was referred by Dr. Jaynee Eagles for full and complete workup of cognitive problems after complaining of some memory loss during a recent visit. My thanks to Dr. Jaynee Eagles for her thorough and organized notes, which were reviewed.    On interview, the patient reported that she appreciated changes mainly after having COVID in May, 2020. She was quite sick and was in the hospital. Review of notes suggests that she was admitted with acute hypoxemic respiratory failure due to SARS CoV-2 pneumonia and required 15L of oxygen. She was eventually weaned from oxygen but was desaturating to 75% on ambulation the day of her discharge. She was eventually discharged to home on 2L of O2 after 13 days. The changes she noticed were "confusion," and by that she means "I couldn't trust my brain, I couldn't trust my reflexes, or maybe I was afraid." Those difficulties did improve, however, and she feels as though she is about 70% back to normal. Her son was able to participate by phone and generally corroborated the history she provided.   At present, she stated that she still "doesn't trust my brain," particularly when driving. She is also nervous about going anywhere since she contracted COVID. It sounds like she does not like to go to stores and doesn't go to stores and she doesn't like having company because "I don't know what they'll bring in." On specific review of symptoms, she is also repeating herself, she will say the same thing several times within the span of a few hours. She hasn't forgotten entire events, this is mostly details. She is generally fairly good with  orientation (she may lose the specific date but not the month or year), she has word finding problems, she also has difficulties focusing and will start one thing and then not finish it. She misplaces things more than in the past. With respect to mood, the patient reported that "some days, I just want to sit with my eyes closed." She denied feeling sad but she does endorse a certain lassitude and lack of enjoyment and pleasure in life. She feels bad at times about her cognitive problems, like she is "dumb." She reported that her energy is variable, it is lower in general than in the past. She reported that she has a hard time getting much more than 6 hours of sleep during the night, because "my mind is working on things" but then she is up only a couple of hours and will want to go back to sleep. She has OSA and is using a CPAP. She said that she sleeps more during the day than she does at night in fact and that she will sleep 8 or so hours in addition to the sleep she gets at night. She has no dream enactment behavior but does have disturbing dreams where she is on a walk and "I get lost, every time." These dreams seem very real to her and she will think about it all day. She doesn't have any flashbacks to her hospitalization, but she described scenes and experiences that she had that she experienced as traumatic, likely the result of delirium. She thought the doctors were trying to hypnotize her  or steal her identity and reprogram her smart phone. She also recalls seeing a globe with people who were still living and dead relatives and "begging god not to take me." She described these images as very vivid in her mind.   With respect to functioning, the patient's son has always managed money for her and her husband, she reported that she has never been good at it. She is nervous about driving, she is afraid she will get lost and did get lost twice when walking about a year ago, although she was driving the other  day and did adequately. She reported that she has a hard time cooking complex things, because she can't focus or can't pay attention to the recipe, although she has not tried to do it since right after she got out of the hospital. She is able to do other things around the house like cleaning up, doing dishes, etc. She does have some mobility limitations in terms of bending over. The patient has never been good with smart phones or a computer and she is able to do those things as well as she did in the past. She is reportedly able to manage her own medications and her son believes that she does so correctly. She has quite a complicated regimen. She has no changes in basic self care. Her son thinks that there are subtle changes in her ability to make decisions and solve problems, although she is still capable of making sound decisions.   Past Medical History and Review of Relevant Studies   Patient Active Problem List   Diagnosis Date Noted  . Coronary artery disease 01/18/2019  . Class 3 severe obesity with serious comorbidity and body mass index (BMI) of 40.0 to 44.9 in adult ([place]) 10/13/2018  . DM (diabetes mellitus), type 2 ([place]) 06/30/2018  . Essential hypertension 06/30/2018  . Right carpal tunnel syndrome 07/17/2017  . Other fatigue 04/29/2017  . Shortness of breath on exertion 04/29/2017  . Vitamin D deficiency 04/29/2017  . Hypersomnolence 06/04/2016  . Nonalcoholic fatty liver disease without nonalcoholic steatohepatitis ([facility]) 02/12/2016  . Gastroesophageal reflux disease with esophagitis 11/23/2014  . History of endometrial cancer 08/25/2014  . Mixed hyperlipidemia 08/25/2014  . Morbid obesity with alveolar hypoventilation ([place]) 08/25/2014  . Bilateral carotid bruits 11/30/2013  . Dyspnea on exertion 11/30/2013  . Angina pectoris ([place]) 11/29/2013  . Osteoarthritis of right hip 09/02/2013  . Cystocele 07/03/2013  . Vaginal pessary present 04/25/2013  . Prolapse of vaginal vault  after hysterectomy 04/25/2013  . Endometrial cancer, grade I ([place]) 04/25/2013  . Unspecified vitamin D deficiency 04/25/2013  . Personal history of other medical treatment 04/25/2013  . Osteoarthritis of left knee 08/02/2012  . Stress incontinence 06/07/2012  . Essential hypertension with goal blood pressure less than 130/85 06/07/2012  . Diabetes ([place]) 06/07/2012  . Obstructive sleep apnea 02/16/2012   Review of Neuroimaging and Relevant Medical History: The patient denied any history of significant head injuries (did have a fall and sustain soft tissue injuries recently), she has no history of strokes, seizures, or neurological surgeries.   Patient and family denied noticing any changes after her angioplasty/stenting in 01/18/2019.   The patient has an MRI brain from 04/04/2020 that shows mild parenchymal and cerebellar volume loss in a nonspecific, generalized distribution. There is a mild burden of leukoaraiosis, with small confluent endcap hyperintensities around the frontal horns of the lateral ventricles and small punctate changes elsewhere. There is a fair amount of disease in  the pons.   Current Outpatient Medications  Medication Sig Dispense Refill  . ACCU-CHEK AVIVA PLUS test strip U UTD  5  . ACCU-CHEK SOFTCLIX LANCETS lancets U UTD  11  . acetaminophen (TYLENOL) 500 MG tablet Take 1,000-1,500 mg by mouth 2 (two) times daily as needed (pain).    . Alirocumab (PRALUENT) 150 MG/ML SOAJ Inject 1 mL into the skin every 14 (fourteen) days. 2 mL 6  . atorvastatin (LIPITOR) 80 MG tablet Take 80 mg by mouth every evening.   5  . bisoprolol (ZEBETA) 10 MG tablet TAKE 1 TABLET BY MOUTH EVERY DAY 90 tablet 1  . cetirizine (ZYRTEC) 10 MG tablet Take 10 mg by mouth at bedtime.     . chlorthalidone (HYGROTON) 25 MG tablet Take 1 tablet (25 mg total) by mouth in the morning for 180 doses. 90 tablet 1  . Cholecalciferol (VITAMIN D) 50 MCG (2000 UT) tablet Take 2,000 Units by mouth daily.    .  clopidogrel (PLAVIX) 75 MG tablet Take 75 mg by mouth daily.    Marland Kitchen escitalopram (LEXAPRO) 20 MG tablet Take 20 mg by mouth every evening.     Marland Kitchen glimepiride (AMARYL) 4 MG tablet Take 4 mg by mouth daily with breakfast.     . losartan (COZAAR) 100 MG tablet Take 1 tablet (100 mg total) by mouth daily. 90 tablet 1  . MAGNESIUM-OXIDE 400 (241.3 Mg) MG tablet Take 400 mg by mouth 3 (three) times daily.     . meloxicam (MOBIC) 15 MG tablet Take 15 mg by mouth daily.    . metFORMIN (GLUCOPHAGE) 1000 MG tablet Take 1 tablet (1,000 mg total) by mouth 2 (two) times daily with a meal. 60 tablet 0  . Misc Natural Products (LUTEIN 20 PO) Take 20 mg by mouth daily.    . Multiple Vitamin (MULTIVITAMIN WITH MINERALS) TABS tablet Take 1 tablet by mouth daily.    . nitroGLYCERIN (NITROSTAT) 0.4 MG SL tablet PLACE 1 TABLET UNDER THE TONGUE EVERY 5 MINUTES AS NEEDED FOR CHEST PAIN 25 tablet 3  . omeprazole (PRILOSEC) 20 MG capsule Take 20 mg by mouth daily.    Marland Kitchen spironolactone (ALDACTONE) 25 MG tablet Take 25 mg by mouth daily.    . TRULICITY 3 GY/6.5LD SOPN Inject 3 mg into the skin every Friday.     Marland Kitchen VASCEPA 1 g capsule TAKE 2 CAPSULES(2 GRAMS) BY MOUTH TWICE DAILY 120 capsule 5   No current facility-administered medications for this visit.   Family History  Problem Relation Age of Onset  . Heart disease Father 23       CABG  . Lung cancer Father 48       lung  . Hypertension Father   . Diabetes Father   . High Cholesterol Father   . Diabetes type II Father   . Heart disease Sister 75       CABG  . Diabetes Sister 108  . Heart disease Brother 41       MI with stents  . Hypertension Mother   . Diabetes Mother   . High Cholesterol Mother   . Sudden death Mother   . Thyroid disease Mother   . Obesity Mother   . Hypertension Sister        x2   . Diabetes Sister   . Hypertension Brother   . Diabetes Brother   . Cancer Sister        high grade Neuroendocrine carcinoma right axilla  .  Pancreatic  cancer Other        x 3 - 2 MAunts and 1 P Aunts.   . Lupus Other        Pat Aunt  . Dementia Neg Hx   . Alzheimer's disease Neg Hx    There is no family history of dementia, her paternal grandfather had a stroke (significant stroke, fatal). There is no  family history of psychiatric illness.  Psychosocial History  Developmental, Educational and Employment History: The patient reported that "I hated" school, she was bullied and reported that she still has nightmares about that sometime. She reported that she did well, nonetheless, and got "A's and B's at least" but she couldn't stand in front of the class to do things like book reports. She left school in the 11th grade, because she "hated school bigtime and had no desire to go any longer." She also got married. 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   Psychiatric History: The patient reported that "I have a little depression." That started in 2005 when she had endometrial cancer and was placed on Lexapro. She reported that her main symptom was "I couldn't turn my brain off, it just keeps going and going, and I couldn't feel either way. There weren't not feelings at all." The patient is currently taking Lexapro, she denied trying other medications, and she has never seen a psychiatrist. She has no history of counseling. No hospitalizations.   Substance Use History: The patient denied any current regular alcohol use, and tobacco or nicotine use, and she doesn't use illicit drugs.   Relationship History and Living Cimcumstances: The patient and her husband have been married for 11 years. That was described as a supportive relationship. They have two sons and one daughter. She has 5 grandchildren and two great grandchildren.    Mental Status and Behavioral Observations  Sensorium/Arousal: The patient's level of arousal was awake and alert. Hearing and vision were  adequate for testing purposes. Orientation: The patient was alert and fully oriented to person, place, time, and situation. Aware of current and past president.  Appearance: Dressed in appropriate, casual clothing with reasonable grooming and hygiene.  Behavior: Appropriate, pleasant, was quite slow in responding to test items I administered with her but presented as giving good effrt.  Speech/language: Speech was normal in rate, rhythm, volume, and prosody.  Gait/Posture: Gait was a bit unsteady, perhaps mildly wide based, with some improvement after she got going.  Movement: No concerning findings suggestive of movement disorder with Dr. Jaynee Eagles. No tremor, bradykinesia, hypokinesia noted today on observation.  Social Comportment: Pleasant and appropriate Mood: "Sometimes, I just want to sit with my eyes closed."  Affect: Stoic Thought process/content: The patient's thought process was logical, linear, and goal-directed. She had a reasonable grasp of her personal history and timeline.  Safety: No thoughts of harming self or others on direct questioning.  Insight: Fair.   Short Blessed Test: Total Weighted Errors Score: 4 (Normal to minimally impaired)  Clock Drawing 9 Normal  Animals:  14 in 1 minute  Serial 3's: 4/6 (Mild impairment)  Some minor difficulties on competing instructions testing.    Test Procedures  Wide Range Achievement Test - 4   Word Reading Reynolds Intellectual Screening Test Wechsler Adult Intelligence Scale - IV  Digit Span  Arithmetic  Symbol Search  Coding Repeatable Battery for the Assessment of Neuropsychological Status (Form  A) ACS Word Choice The Dot Counting Test Controlled Oral Word Association (F-A-S) Semantic Fluency (Animals) Trail Making Test A & B Wisconsin Card Sorting Test [phone] Patient [place] was seen for a psychiatric diagnostic evaluation and neuropsychological testing. She is a  pleasant, 69 year old, right-hand dominant woman referred by my colleague Dr. Sarina Ill at [facility] who is working her up for memory loss. My thanks to Dr. Jaynee Eagles for her detailed note. Briefly, the patient has a history of COVID pneumonia with acute hypoxemic respiratory failure requiring 15L oxygen and was in the hospital for about 13 days. She likely had a delirium from her description of her memory of the events. Since then, she has had some residual brain fog, although she feels as though she is about "70%" better than she was immediately after the hospital stay. On neurobehaviora/mental status testing she is scoring fairly well, her MMSE was also well preserved with Dr. Jaynee Eagles, my sense is that a lot of her "impairment" is due to avoidance and general poor health status but testing will be helpful. Also question premorbid issues given limited academic involvement. Full and complete note with impressions, recommendations, and interpretation of test data to follow.   Viviano Simas Nicole Kindred, PsyD, [facility] Clinical Neuropsychologist  Informed Consent  Risks and benefits of the evaluation were discussed with the patient prior to all testing procedures. I conducted a clinical interview and neuropsychological testing (at least two tests) with [name] and Milana Kidney, B.S. (Technician) administered additional test procedures. The patient was able to tolerate the testing procedures and the patient (and/or family if applicable) is likely to benefit from further follow up to receive the diagnosis and treatment recommendations, which will be rendered at the next encounter.

## 2020-05-02 NOTE — Progress Notes (Signed)
   Psychometrist Note   Cognitive testing was administered to [name] [name] by Milana Kidney, B.S. (Technician) under the supervision of Alphonzo Severance, Psy.D., ABN. [name] was able to tolerate all test procedures. Dr. Nicole Kindred met with the patient as needed to manage any emotional reactions to the testing procedures. Rest breaks were offered.    The battery of tests administered was selected by Dr. Nicole Kindred with consideration to the patient's current level of functioning, the nature of her symptoms, emotional and behavioral responses during the interview, level of literacy, observed level of motivation/effort, and the nature of the referral question. This battery was communicated to the psychometrist. Communication between Dr. Nicole Kindred and the psychometrist was ongoing throughout the evaluation and Dr. Nicole Kindred was immediately accessible at all times. Dr. Nicole Kindred provided supervision to the technician on the date of this service, to the extent necessary to assure the quality of all services provided.    [name] will return in approximately one week for an interactive feedback session with Dr. Nicole Kindred, at which time test performance, clinical impressions, and treatment recommendations will be reviewed in detail. The patient understands she can contact our office should she require our assistance before this time.   A total of 105 minutes of billable time were spent with [name] [name] by the technician, including test administration and scoring time. Billing for these services is reflected in Dr. Les Pou note.   This note reflects time spent with the psychometrician and does not include test scores, clinical history, or any interpretations made by Dr. Nicole Kindred. The full report will follow in a separate note.

## 2020-05-03 NOTE — Progress Notes (Signed)
[facility]  Patient Name: HEDY GARRO MRN: [id number] Date of Birth: [birth year] Age: 70 y.o. Education: 11 years  Measurement properties of test scores: IQ, Index, and Standard Scores (SS): Mean = 100; Standard Deviation = 15 Scaled Scores (Ss): Mean = 10; Standard Deviation = 3 Z scores (Z): Mean = 0; Standard Deviation = 1 T scores (T); Mean = 50; Standard Deviation = 10  TEST SCORES:    Note: This summary of test scores accompanies the interpretive report and should not be interpreted by unqualified individuals or in isolation without reference to the report. Test scores are relative to age, gender, and educational history as available and appropriate.   Performance Validity        ACS: Raw Descriptor      Word Choice: 50 Within Expectation      The Dot Counting Test: Raw Descriptor      E-Score 11 Within Expectation      Embedded Measures: Raw Descriptor      RBANS Effort Index: 3 Within Expectation      WAIS-IV Reliable Digit Span 7 Within Expectation      WAIS-IV Reliable Digit Span Revised 11 Within Expectation      Expected Functioning        Wide Range Achievement Test (Word Reading): Standard/Scaled Score Percentile       Word Reading 88 21      Reynolds Intellectual Screening Test Standard/T-score Percentile      Guess What 49 46      Odd Item Out 48 42  RIST Index 98 45      Cognitive Testing        RBANS, Form : Standard/Scaled Score Percentile  Total Score 84 14  Immediate Memory 87 19      List Learning 7 16      Story Memory 9 37  Visuospatial/Constructional 92 30      Figure Copy   (19) 11 63      Judgment of Line Orientation   (14) --- 17-25  Language 96 39      Picture Naming --- 51-75      Semantic Fluency 8 25  Attention 64 1      Digit Span 4 2      Coding 5 5  Delayed Memory 102 55      List Recall   (8) --- >75      List Recognition   (19) --- 26-50      Story Recall   (8) 9 37       Figure Recall   (13) 10 50      Wechsler Adult Intelligence Scale - IV: Standard/Scaled Score Percentile  Working Memory Index 83 13      Digit Span 7 16          Digit Span Forward 5 5          Digit Span Backward 9 37          Digit Span Sequencing 9 37      Arithmetic 7 16  Processing Speed Index 84 14      Symbol Search 9 37      Coding 5 5      Neuropsychological Assessment Battery (Language Module): T-score Percentile      Naming   (31) 58 79      Verbal Fluency: T-score Percentile      Controlled Oral Word Association (F-A-S) 44 27  Semantic Fluency (Animals) 41 18      Trail Making Test: T-Score Percentile      Part A 51 54      Part B 42 21      Modified [place] Card Sorting Test (MWCST): Standard/T-Score Percentile      Number of Categories Correct 63 91      Number of Perseverative Errors 96 75      Number of Total Errors 64 92      Percent Perseverative Errors 65 93  Executive Function Composite 126 96      Boston Diagnostic Aphasia Exam: Raw Score Scaled Score      Complex Ideational Material 12 12      Clock Drawing Raw Score Descriptor      Command 9 WNL      Rating Scales         Raw Score Descriptor  Patient Health Questionnaire - 9 18 Moderately-Severe  GAD-7 11 Moderate   Avalyn Molino V. Nicole Kindred PsyD, [place] Clinical Neuropsychologist

## 2020-05-04 ENCOUNTER — Other Ambulatory Visit: Payer: Self-pay

## 2020-05-04 DIAGNOSIS — I2510 Atherosclerotic heart disease of native coronary artery without angina pectoris: Secondary | ICD-10-CM

## 2020-05-04 DIAGNOSIS — I10 Essential (primary) hypertension: Secondary | ICD-10-CM

## 2020-05-04 DIAGNOSIS — E781 Pure hyperglyceridemia: Secondary | ICD-10-CM

## 2020-05-04 DIAGNOSIS — E782 Mixed hyperlipidemia: Secondary | ICD-10-CM

## 2020-05-04 NOTE — Progress Notes (Signed)
[facility]  Patient Name: [name] [name] MRN: [id number] Date of Birth: 1950-04-16 Age: 70 y.o. Education: 52 years  Clinical Impressions  [name] [name] is a 70 y.o., right-hand dominant, married woman with a history of COVID-19 pneumonia associated with acute hypoxemic respiratory failure and resulting in a hospitalization in May, 2020. She has a past medical history of HTN, DMII, HLD, CAD, endometrial cancer and obesity. From her description, it sounds as though she was quite delirious during the hospitalization, concerned about doctors stealing her identity on her iPhone or trying to hypnotize her. Since that time, she feels like she hasn't been as sharp and has minor day-to-day cognitive problems. She also has significant avoidance behavior and doesn't trust herself to drive because she got lost when walking a year ago, right after her hospitalization. She has anxiety and doesn't sleep more than 6 hours, sleeping 8 hours thereafter in the day. She has some affective issues and significant anhedonia.   On neuropsychological testing, [name] [name] demonstrated test performance falling largely within expectation for her. She did generate scattered low scores on several measures of attention and processing speed despite reasonable if not modest levels of performance in those domains as a whole. Performance on memory testing was generally good with very good retention of information across time. She has no naming or other language problems and did well on dedicated measures of executive ability. She reported very significant levels of psychiatric symptoms including moderately severe depressive symptoms and anxiety symptoms. She indicated that she has felt "down, depressed, or hopeless" more than half the days over the past 2 weeks and that she feels "afraid, as if something awful might happen" the same amount.   [name] [name] is thus demonstrating significant  psychiatric disturbance and perhaps some very mild cognitive decline. While there is a fair amount of literature suggesting that people with a history of delirium can continue to demonstrate mild cognitive problems, her insomnia and affective issues could just as likely be explanatory. There could be a minor vascular contribution as well. Regardless of the cause,. I think that avoidance conditioning and anxiety are preventing her from fully re-engaging with her life and should be prioritized. I will counsel her about sleep hygiene. She may also benefit from antidepressant medication titration and/or adjuvent psychotherapy.  Diagnostic Impressions: Mild neurocognitive disorder Depression with anxiety  Recommendations to be discussed with patient  Your performance and presentation on neuropsychological assessment were, by and large, within normal limits for you. This is positive and suggests that under ideal, controlled circumstances, you are able to perform well cognitively. You did have scattered low scores on a couple measures of attention and processing speed that may be picking up on the issues you notice day-to-day. In lieu of your report that you have noticed a definite and significant change, a diagnosis of mild neurocognitive disorder seems reasonable.   Mild neurocognitive disorder is a term for memory and thinking problems that can be noticed on testing and by the individual affected (and perhaps others) that is not severe enough to appreciably interfere with their day-to-day functioning.   In your case, there are several possible etiologies for your mild neurocognitive disorder. It is known that people who experience periods of acute illness presenting with alteration in mental status can and do often have some degree of lingering cognitive problem, and that would certainly go well with the timeline you report (you noticed your symptoms after being hospitalized with COVID). On  the other hand,  poor sleep, depression, and anxiety are also frequent causes of cognitive impairment and those could just as easily be the cause. The positive thing is that if that were the case, then you may not have "true" MCI and you may be able to improve your memory and thinking once you get those issues under control.   Depression can affect cognitive functioning in several ways. For one, there are neurobiological changes in depression that can contribute to attention, concentration, and memory problems. These changes are so common they are part of the criteria we use to diagnose depressive disorders. Depression can also negatively impact your own appraisal of your cognitive abilities, leading you to feel like you are performing more poorly than is objectively warranted. My suggestion would be that you discuss medication titration with [name] [name] or consider adding psychotherapy. Therapy can be particularly helpful when your depression is the result of difficult life circumstances, such as loss of a loved one or other difficult transitions.   Avoid overfocusing on cognitive performance. Memory and cognition are notoriously fallible and if you are looking for cognitive problems, you are bound to find them. Once someone gets worried about their memory and thinking, they may overfocus on how they are doing day-to-day, and then when normal day-to-day cognitive errors are made, this becomes a cause for more concern. This concern and anxiety then decreases focus from the task at hand, reducing concentration, causing more cognitive problems, and creating a vicious cycle. Rather than critiquing your performance, I would encourage you to remain present minded and focus on the task at hand. Perhaps most importantly, have reasonable expectations for yourself.  Healthy people forget things, lose focus, and do not perform 100% correctly all the time. Some cognitive errors are normal and are not necessarily a sign that there is  something wrong with your brain.   There are few things as disruptive to brain functioning as not getting a good night's sleep. For sleep, I recommend against using medications, which can have lingering sedating effects on the brain and rob your brain of restful REM sleep. Instead, consider trying some of the following sleep hygiene recommendations. They may not work at once and may take effort, but the effort you spend is likely to be rewarded with better sleep eventually:  . Stick to a sleep schedule of the same bedtime and wake time even on the weekends, which can help to regulate your body's internal clock so that you fall asleep and stay asleep.  . Practice a relaxing bedtime ritual (conducted away from bright lights) which will help separate your sleep from stimulating activities and prepare your body to fall asleep when you go to bed.  . Avoid naps, especially in the afternoon.  . Evaluate your room and create conditions that will promote sleep such as keeping it cool (between 60 - 67 degrees), quiet, and free from any lights. Consider using blackout curtains, a "white noise" generator, or fan that will help mask any noises that might prevent you from going to sleep or awaken you during the night.  . Sleep on a comfortable mattress and pillows.  . Avoid bright light in the evening and excessive use of portable electronic devices right before bed that may contain light frequencies that can contribute to sleep problems.  . Avoid alcohol, cigarettes, or heavy meals in the evening. If you must eat, consume a light snack 45 minutes before bed.  . Use your bed only for sleep to  strengthen the association between your bed and sleep.  . If you can't go to sleep within 30 minutes, go into another room and do something relaxing until you feel tired. Then, come back and try to go to sleep again for 30 minutes and repeat until sleep is achieved.  . Some people find over the counter melatonin to be helpful  for sleep, which you could discuss with a pharmacist or prescribing provider.   You also have numerous ongoing health conditions, including diabetes, that can be risk factors for cognitive impairment and/or contribute to cognitive decline. Therefore I suggest that you work with your medical treatment providers to assertively manage those conditions. This may include things such as lifestyle changes (e.g., dietary changes, increasing exercise, losing weight, etc) in addition to medications. You can follow up with your PCP.   Most importantly, do not let the fear of negative outcomes ensure that you cannot participate fully in your life. I do not see any reason why you should be unable to drive and do the things that you did previously, so long as you transition back gradually, as per your test data and presentation. Often times once routines are disrupted, they become harder to reestablish and it will take some work up front for you. This avoidance is self-reinforcing and often can create a cycle of more avoidance, relief that the feared outcome (e.g., getting lost, getting COVID, having something bad happy) didn't occur, leading to more avoidance behavior.   There is a significant research base and evidence of effectiveness for something called "behavioral activation," which is a fancy way of saying that you should increase your activity level. In general, people do not feel as happy or do as well when they are not doing much. This can include things like getting out for walks, re-engaging in hobbies, spending time with family or friends, or learning a new hobby. It's not so important what you do as that you enjoy it and stick with it. Depression can start a vicious cycle where you are not doing a lot because you don't feel well, which leads to less things to be excited and happy about, and thus more depression and behavioral avoidance. It can be hard to change this pattern once it has started but most people  find that they feel better when they start doing more even if they don't enjoy it at first.   Test Findings  Test scores are summarized in additional documentation associated with this encounter. Test scores are relative to age, gender, and educational history as available and appropriate. There were no concerns about performance validity as all findings fell within normal expectations.   General Intellectual Functioning/Achievement:  Performance on single word reading was low average, which may be due to limited academic enrichment. Her overall ability level measure in the middle of the average range on the RIST index, with comparable average range scores on its visually and verbally oriented subtests.   Attention and Processing Efficiency: Performance on indicators of attention and working memory showed a low average score overall on the WAIS-IV; however, on additional testing, there were scattered low scores suggesting the possibility of some decline. Digit repetition forward was extremely low on the RBANS and unusually low on the WAIS-IV. She did better on digit repetition backward and digit resequencing in ascending order, which were average. Mental solving of arithmetical word problems was low average.   Performance on indicators of processing speed once again fell at a reasonable low average level  on the WAIS-IV index but there were scattered low scores, with unusually low performance on two different measures of timed number-symbol coding. Performance on a symbol matching to sample task emphasizing efficient visual scanning and efficient visual matching was average.   Language: Performance was generally good in this domain with normal visual object confrontation naming and average to low average scores on fluency measures.   Visuospatial Function: Visuospatial and constructional functioning fell at an average level on the overall index from the RBANS. Copy of a modestly complex figure was  average. Judgment of angular line orientations was low average to average.   Learning and Memory: Performance on measures of memory and learning was reasonable, albeit with somewhat weaker encoding than delayed free recall. Overall, the profile is reassuring that she has good storage ability and is able to acquire and retain information over time.   In the verbal realm, immediate recall for a 10-item word list was low average, followed by high average delayed recall. Recognition of the words versus foils was also good, falling at an average level. Memory for a short story was average followed by comparable average range recall.   Delayed recall for a modestly complex geometric figure was average.   Executive Functions: Performance on executive measures was generally good with a strong, superior range score on the EF Composite of the [employer]. Alternating sequencing of numbers and letters of the alphabet was low average. Reasoning with verbal information was errorless. Clock drawing was within normal limits with good formation of the face, number placement, and hand position.   Rating Scale(s): Performance on measures of self-reported psychiatric symptoms suggested fairly significant affective distress. [name] [name] reported moderately severe levels of depressive symptoms and moderate levels of anxiety symptoms.   [name] Simas Nicole Kindred, PsyD, ABN Clinical Neuropsychologist  Coding and Compliance  Billing below reflects technician time, my direct face-to-face time with the patient, time spent in test administration, and time spent in professional activities including but not limited to: neuropsychological test interpretation, integration of neuropsychological test data with clinical history, report preparation, treatment planning, care coordination, and review of diagnostically pertinent medical history or studies.   Services associated with this encounter: Clinical Interview  [phone]) plus 160 minutes (96132/96133; Neuropsychological Evaluation by Professional)  23 minutes (96136/96137; Test Administration by Professional) 105 minutes (96138/96139; Neuropsychological Testing by Technician)

## 2020-05-04 NOTE — Progress Notes (Signed)
Patient is on med I told her to hold it

## 2020-05-09 ENCOUNTER — Other Ambulatory Visit: Payer: Self-pay

## 2020-05-09 ENCOUNTER — Encounter: Payer: Self-pay | Admitting: Counselor

## 2020-05-09 ENCOUNTER — Ambulatory Visit (INDEPENDENT_AMBULATORY_CARE_PROVIDER_SITE_OTHER): Payer: Medicare Other | Admitting: Counselor

## 2020-05-09 DIAGNOSIS — G3184 Mild cognitive impairment, so stated: Secondary | ICD-10-CM | POA: Insufficient documentation

## 2020-05-09 DIAGNOSIS — F418 Other specified anxiety disorders: Secondary | ICD-10-CM | POA: Insufficient documentation

## 2020-05-09 NOTE — Progress Notes (Signed)
[facility]  Feedback Note: I met with [name] to review the findings resulting from her neuropsychological evaluation. Since the last appointment, she has been about the same.Time was spent reviewing the impressions and recommendations that are detailed in the evaluation report. We discussed impression of perhaps very mild neurocognitive disorder, although I am not entirely convinced that this is necessarily an "organic" problem and think her sleep difficulties, mood issues and the like may be just as much to blame. We had a long discussion about avoidance learning, the need to re-engage in abandoned behaviors. She brought her grand daughter who admitted that she is a bit phobic about things and has substantial avoidance, she will help her to reengage. Other topics of discussion as reflected in the patient instructions. I took time to explain the findings and answer all the patient's questions. I encouraged Ms. Henk to contact me should she have any further questions or if further follow up is desired.   Current Medications and Medical History   Current Outpatient Medications  Medication Sig Dispense Refill  . ACCU-CHEK AVIVA PLUS test strip U UTD  5  . ACCU-CHEK SOFTCLIX LANCETS lancets U UTD  11  . acetaminophen (TYLENOL) 500 MG tablet Take 1,000-1,500 mg by mouth 2 (two) times daily as needed (pain).    . Alirocumab (PRALUENT) 150 MG/ML SOAJ Inject 1 mL into the skin every 14 (fourteen) days. 2 mL 6  . atorvastatin (LIPITOR) 80 MG tablet Take 80 mg by mouth every evening.   5  . bisoprolol (ZEBETA) 10 MG tablet TAKE 1 TABLET BY MOUTH EVERY DAY 90 tablet 1  . cetirizine (ZYRTEC) 10 MG tablet Take 10 mg by mouth at bedtime.     . chlorthalidone (HYGROTON) 25 MG tablet Take 1 tablet (25 mg total) by mouth in the morning for 180 doses. 90 tablet 1  . Cholecalciferol (VITAMIN D) 50 MCG (2000 UT) tablet Take 2,000 Units by mouth daily.    .  clopidogrel (PLAVIX) 75 MG tablet Take 75 mg by mouth daily.    Marland Kitchen escitalopram (LEXAPRO) 20 MG tablet Take 20 mg by mouth every evening.     Marland Kitchen glimepiride (AMARYL) 4 MG tablet Take 4 mg by mouth daily with breakfast.     . losartan (COZAAR) 100 MG tablet Take 1 tablet (100 mg total) by mouth daily. 90 tablet 1  . MAGNESIUM-OXIDE 400 (241.3 Mg) MG tablet Take 400 mg by mouth 3 (three) times daily.     . meloxicam (MOBIC) 15 MG tablet Take 15 mg by mouth daily.    . metFORMIN (GLUCOPHAGE) 1000 MG tablet Take 1 tablet (1,000 mg total) by mouth 2 (two) times daily with a meal. 60 tablet 0  . Misc Natural Products (LUTEIN 20 PO) Take 20 mg by mouth daily.    . Multiple Vitamin (MULTIVITAMIN WITH MINERALS) TABS tablet Take 1 tablet by mouth daily.    . nitroGLYCERIN (NITROSTAT) 0.4 MG SL tablet PLACE 1 TABLET UNDER THE TONGUE EVERY 5 MINUTES AS NEEDED FOR CHEST PAIN 25 tablet 3  . omeprazole (PRILOSEC) 20 MG capsule Take 20 mg by mouth daily.    Marland Kitchen spironolactone (ALDACTONE) 25 MG tablet Take 25 mg by mouth daily.    . TRULICITY 3 MG/8.6PY SOPN Inject 3 mg into the skin every Friday.     Marland Kitchen VASCEPA 1 g capsule TAKE 2 CAPSULES(2 GRAMS) BY MOUTH TWICE DAILY 120 capsule 5   No current facility-administered medications  for this visit.    Patient Active Problem List   Diagnosis Date Noted  . Coronary artery disease 01/18/2019  . Class 3 severe obesity with serious comorbidity and body mass index (BMI) of 40.0 to 44.9 in adult ([place]) 10/13/2018  . DM (diabetes mellitus), type 2 ([place]) 06/30/2018  . Essential hypertension 06/30/2018  . Right carpal tunnel syndrome 07/17/2017  . Other fatigue 04/29/2017  . Shortness of breath on exertion 04/29/2017  . Vitamin D deficiency 04/29/2017  . Hypersomnolence 06/04/2016  . Nonalcoholic fatty liver disease without nonalcoholic steatohepatitis ([facility]) 02/12/2016  . Gastroesophageal reflux disease with esophagitis 11/23/2014  . History of endometrial cancer  08/25/2014  . Mixed hyperlipidemia 08/25/2014  . Morbid obesity with alveolar hypoventilation ([place]) 08/25/2014  . Bilateral carotid bruits 11/30/2013  . Dyspnea on exertion 11/30/2013  . Angina pectoris ([place]) 11/29/2013  . Osteoarthritis of right hip 09/02/2013  . Cystocele 07/03/2013  . Vaginal pessary present 04/25/2013  . Prolapse of vaginal vault after hysterectomy 04/25/2013  . Endometrial cancer, grade I ([place]) 04/25/2013  . Unspecified vitamin D deficiency 04/25/2013  . Personal history of other medical treatment 04/25/2013  . Osteoarthritis of left knee 08/02/2012  . Stress incontinence 06/07/2012  . Essential hypertension with goal blood pressure less than 130/85 06/07/2012  . Diabetes ([place]) 06/07/2012  . Obstructive sleep apnea 02/16/2012    Mental Status and Behavioral Observations  LACHELL ROCHETTE presented on time to the present encounter and was alert and generally oriented. Speech was normal in rate, rhythm, volume, and prosody. Self-reported mood was "good" and affect was euthymic, she was happy with the news that this is no more than a mild cognitive problem if it is a cognitive problem at all. Thought process was logical and goal oriented and thought content was appropriate to the topics discussed. There were no safety concerns identified at today's encounter, such as thoughts of harming self or others.   Plan  Feedback provided regarding the patient's neuropsychological evaluation. I encouraged her to initiate behavioral activation, work with her PCP on medication for mood and also diabetes management. For some reason, she has a taste for potatoes since COVID. Explained that these have a higher glycemic index than sugar and she will work on eating more healthily. She presented as very reassured regarding the findings. LACHELLE RISSLER was encouraged to contact me if any questions arise or if further follow up is desired.   Viviano Simas Nicole Kindred, PsyD, ABN Clinical  Neuropsychologist  Service(s) Provided at This Encounter: 27 minutes [phone]; Conjoint therapy with patient present)

## 2020-05-09 NOTE — Patient Instructions (Signed)
Your performance and presentation on neuropsychological assessment were, by and large, within normal limits for you. This is positive and suggests that under ideal, controlled circumstances, you are able to perform well cognitively. You did have scattered low scores on a couple measures of attention and processing speed that may be picking up on the issues you notice day-to-day. In lieu of your report that you have noticed a definite and significant change, a diagnosis of mild neurocognitive disorder seems reasonable.   Mild neurocognitive disorder is a term for memory and thinking problems that can be noticed on testing and by the individual affected (and perhaps others) that is not severe enough to appreciably interfere with their day-to-day functioning.   In your case, there are several possible etiologies for your mild neurocognitive disorder. It is known that people who experience periods of acute illness presenting with alteration in mental status can and do often have some degree of lingering cognitive problem, and that would certainly go well with the timeline you report (you noticed your symptoms after being hospitalized with COVID). On the other hand, poor sleep, depression, and anxiety are also frequent causes of cognitive impairment and those could just as easily be the cause. The positive thing is that if that were the case, then you may not have "true" MCI and you may be able to improve your memory and thinking once you get those issues under control.   Depression can affect cognitive functioning in several ways. For one, there are neurobiological changes in depression that can contribute to attention, concentration, and memory problems. These changes are so common they are part of the criteria we use to diagnose depressive disorders. Depression can also negatively impact your own appraisal of your cognitive abilities, leading you to feel like you are performing more poorly than is objectively  warranted. My suggestion would be that you discuss medication titration with Dr. Ouida Sills or consider adding psychotherapy. Therapy can be particularly helpful when your depression is the result of difficult life circumstances, such as loss of a loved one or other difficult transitions.   Avoid overfocusing on cognitive performance. Memory and cognition are notoriously fallible and if you are looking for cognitive problems, you are bound to find them. Once someone gets worried about their memory and thinking, they may overfocus on how they are doing day-to-day, and then when normal day-to-day cognitive errors are made, this becomes a cause for more concern. This concern and anxiety then decreases focus from the task at hand, reducing concentration, causing more cognitive problems, and creating a vicious cycle. Rather than critiquing your performance, I would encourage you to remain present minded and focus on the task at hand. Perhaps most importantly, have reasonable expectations for yourself.  Healthy people forget things, lose focus, and do not perform 100% correctly all the time. Some cognitive errors are normal and are not necessarily a sign that there is something wrong with your brain.   There are few things as disruptive to brain functioning as not getting a good night's sleep. For sleep, I recommend against using medications, which can have lingering sedating effects on the brain and rob your brain of restful REM sleep. Instead, consider trying some of the following sleep hygiene recommendations. They may not work at once and may take effort, but the effort you spend is likely to be rewarded with better sleep eventually:   Stick to a sleep schedule of the same bedtime and wake time even on the weekends, which can help  to regulate your body's internal clock so that you fall asleep and stay asleep.   Practice a relaxing bedtime ritual (conducted away from bright lights) which will help separate  your sleep from stimulating activities and prepare your body to fall asleep when you go to bed.   Avoid naps, especially in the afternoon.   Evaluate your room and create conditions that will promote sleep such as keeping it cool (between 60 - 67 degrees), quiet, and free from any lights. Consider using blackout curtains, a "white noise" generator, or fan that will help mask any noises that might prevent you from going to sleep or awaken you during the night.   Sleep on a comfortable mattress and pillows.   Avoid bright light in the evening and excessive use of portable electronic devices right before bed that may contain light frequencies that can contribute to sleep problems.   Avoid alcohol, cigarettes, or heavy meals in the evening. If you must eat, consume a light snack 45 minutes before bed.   Use your bed only for sleep to strengthen the association between your bed and sleep.   If you can't go to sleep within 30 minutes, go into another room and do something relaxing until you feel tired. Then, come back and try to go to sleep again for 30 minutes and repeat until sleep is achieved.   Some people find over the counter melatonin to be helpful for sleep, which you could discuss with a pharmacist or prescribing provider.   You also have numerous ongoing health conditions, including diabetes, that can be risk factors for cognitive impairment and/or contribute to cognitive decline. Therefore I suggest that you work with your medical treatment providers to assertively manage those conditions. This may include things such as lifestyle changes (e.g., dietary changes, increasing exercise, losing weight, etc) in addition to medications. You can follow up with your PCP.   Most importantly, do not let the fear of negative outcomes ensure that you cannot participate fully in your life. I do not see any reason why you should be unable to drive and do the things that you did previously, so long as you  transition back gradually, as per your test data and presentation. Often times once routines are disrupted, they become harder to reestablish and it will take some work up front for you. This avoidance is self-reinforcing and often can create a cycle of more avoidance, relief that the feared outcome (e.g., getting lost, getting COVID, having something bad happy) didn't occur, leading to more avoidance behavior.   There is a significant research base and evidence of effectiveness for something called "behavioral activation," which is a fancy way of saying that you should increase your activity level. In general, people do not feel as happy or do as well when they are not doing much. This can include things like getting out for walks, re-engaging in hobbies, spending time with family or friends, or learning a new hobby. It's not so important what you do as that you enjoy it and stick with it. Depression can start a vicious cycle where you are not doing a lot because you don't feel well, which leads to less things to be excited and happy about, and thus more depression and behavioral avoidance. It can be hard to change this pattern once it has started but most people find that they feel better when they start doing more even if they don't enjoy it at first.

## 2020-05-16 LAB — BASIC METABOLIC PANEL
BUN/Creatinine Ratio: 24 (ref 12–28)
BUN: 20 mg/dL (ref 8–27)
CO2: 24 mmol/L (ref 20–29)
Calcium: 9.5 mg/dL (ref 8.7–10.3)
Chloride: 101 mmol/L (ref 96–106)
Creatinine, Ser: 0.84 mg/dL (ref 0.57–1.00)
Glucose: 146 mg/dL — ABNORMAL HIGH (ref 65–99)
Potassium: 4.6 mmol/L (ref 3.5–5.2)
Sodium: 142 mmol/L (ref 134–144)
eGFR: 76 mL/min/{1.73_m2} (ref >59)

## 2020-05-16 LAB — MAGNESIUM: Magnesium: 1.5 mg/dL — ABNORMAL LOW (ref 1.6–2.3)

## 2020-05-16 NOTE — Progress Notes (Signed)
Called and spoke to pt regarding lab results. Pt voiced understanding. Pt is switching appointment to April.

## 2020-06-01 NOTE — Progress Notes (Signed)
[name] [name] Date of Birth: [birth year] MRN: [id number] [place], [name], [place] Former Cardiology Providers: Jeri Lager, APRN, FNP-C Primary Cardiologist: Rex Kras, DO, [facility] (established care 11/03/2019)  Date: 06/04/20 Last Office Visit: 04/30/2020  Chief Complaint  Patient presents with  . Coronary artery disease involving native coronary artery of  . Hyperlipidemia  . Follow-up    HPI  [name] [name] is a 70 y.o.  female who presents to the office with a chief complaint of " follow-up for triglyceride management, hyperkalemia, recently discontinued spironolactone, and her underlying CAD." Patient's past medical history and cardiovascular risk factors include: Diabetes mellitus type 2, morbid obesity, obstructive sleep apnea on CPAP, mixed hyperlipidemia, chronic palpitations with history of PACs and PVCs, CAD status post PCI to the LAD 01/18/2019, postmenopausal female, advanced age.  She is being followed in clinic given her underlying coronary artery disease requiring prior coronary intervention and multiple cardiovascular risk factors.  She is here earlier than her scheduled office visit for lipid management.  Patient had lipid profile performed in March 2022 which notes elevated triglyceride levels.  Most recent triglyceride level was 280 mg/dL.  LDL very well optimized at 15 mg/dL.  Since last office visit patient has reduced her potato intake.  Unfortunately, for the last 2 weeks has been off of Vascepa due to medication shortage at her pharmacy.  And she has made meaningful efforts with lifestyle modification and has lost 3 pounds since last office visit.  The shared decision is to not uptitrate her hypertriglyceridemia medications at this time.  She also complains of being more tired, fatigue, at times she prefers to lay in bed for prolonged period of time.  She denies any chest pain or shortness of breath at rest or with effort late  activities.  Since last visit we have also stopped spironolactone due to hyperkalemia at a potassium level of 5.8.  Her blood pressure today is within acceptable range.   FUNCTIONAL STATUS: No structured exercise program or daily routine.   ALLERGIES: Allergies  Allergen Reactions  . Codeine Palpitations   MEDICATION LIST PRIOR TO VISIT: Current Outpatient Medications on File Prior to Visit  Medication Sig Dispense Refill  . ACCU-CHEK AVIVA PLUS test strip U UTD  5  . ACCU-CHEK SOFTCLIX LANCETS lancets U UTD  11  . acetaminophen (TYLENOL) 500 MG tablet Take 1,000-1,500 mg by mouth 2 (two) times daily as needed (pain).    . Alirocumab (PRALUENT) 150 MG/ML SOAJ Inject 1 mL into the skin every 14 (fourteen) days. 2 mL 6  . atorvastatin (LIPITOR) 80 MG tablet Take 80 mg by mouth every evening.   5  . cetirizine (ZYRTEC) 10 MG tablet Take 10 mg by mouth at bedtime.     . chlorthalidone (HYGROTON) 25 MG tablet Take 1 tablet (25 mg total) by mouth in the morning for 180 doses. 90 tablet 1  . Cholecalciferol (VITAMIN D) 50 MCG (2000 UT) tablet Take 2,000 Units by mouth daily.    . clopidogrel (PLAVIX) 75 MG tablet Take 75 mg by mouth daily.    Marland Kitchen escitalopram (LEXAPRO) 20 MG tablet Take 20 mg by mouth every evening.     Marland Kitchen glimepiride (AMARYL) 4 MG tablet Take 4 mg by mouth daily with breakfast.     . losartan (COZAAR) 100 MG tablet Take 1 tablet (100 mg total) by mouth daily. 90 tablet 1  . MAGNESIUM-OXIDE 400 (241.3 Mg) MG tablet Take 400 mg by mouth 3 (three) times  daily.     . meloxicam (MOBIC) 15 MG tablet Take 15 mg by mouth daily.    . metFORMIN (GLUCOPHAGE) 1000 MG tablet Take 1 tablet (1,000 mg total) by mouth 2 (two) times daily with a meal. 60 tablet 0  . Misc Natural Products (LUTEIN 20 PO) Take 20 mg by mouth daily.    . Multiple Vitamin (MULTIVITAMIN WITH MINERALS) TABS tablet Take 1 tablet by mouth daily.    . nitroGLYCERIN (NITROSTAT) 0.4 MG SL tablet PLACE 1 TABLET UNDER THE  TONGUE EVERY 5 MINUTES AS NEEDED FOR CHEST PAIN 25 tablet 3  . TRULICITY 3 HK/7.4QV SOPN Inject 3 mg into the skin every Friday.     Marland Kitchen VASCEPA 1 g capsule TAKE 2 CAPSULES(2 GRAMS) BY MOUTH TWICE DAILY 120 capsule 5  . pantoprazole (PROTONIX) 40 MG tablet Take 1 tablet by mouth daily.     No current facility-administered medications on file prior to visit.    PAST MEDICAL HISTORY: Past Medical History:  Diagnosis Date  . Anxiety   . Carpal tunnel syndrome of right wrist   . Coronary artery disease    per cath 11-29-2013  mild diffuse CAD and calcification in proximal LAD  . Depression   . Diverticulosis   . Essential hypertension   . GERD (gastroesophageal reflux disease)   . Heart disease   . History of endometrial cancer 05/2003   FIGO, Grade 1  s/p  TAG w/ BSO  . HTN (hypertension)   . Hyperlipidemia   . OA (osteoarthritis)   . OSA on CPAP    followed by dr Elsworth Soho-- per study 03-26-2012  mod. to severe OSA , AHI 37/hr  . Type II diabetes mellitus ([place])    followed by pcp  . Urinary incontinence in female   . Vitamin D deficiency     PAST SURGICAL HISTORY: Past Surgical History:  Procedure Laterality Date  . CARDIAC CATHETERIZATION  11/29/2013   dr Einar Gip   mild diffuse noncritial CAD and mild calcification involving the proximal LAD,  LVEF 55-60%  . CARPAL TUNNEL RELEASE Right 07/17/2017   Procedure: CARPAL TUNNEL RELEASE;  Surgeon: Dorna Leitz, MD;  Location: [place];  Service: Orthopedics;  Laterality: Right;  . CHOLECYSTECTOMY    . COLONOSCOPY WITH PROPOFOL N/A 01/31/2020   Procedure: COLONOSCOPY WITH PROPOFOL;  Surgeon: Juanita Craver, MD;  Location: [facility];  Service: Endoscopy;  Laterality: N/A;  . CORONARY ATHERECTOMY N/A 01/18/2019   Procedure: CORONARY ATHERECTOMY;  Surgeon: Adrian Prows, MD;  Location: [place] [facility];  Service: Cardiovascular;  Laterality: N/A;  . CORONARY STENT INTERVENTION N/A 01/18/2019   Procedure: CORONARY STENT  INTERVENTION;  Surgeon: Adrian Prows, MD;  Location: [place] [facility];  Service: Cardiovascular;  Laterality: N/A;  . KNEE ARTHROSCOPY Left 10/ 2004;  05-17-2008   dr graves  . KNEE ARTHROSCOPY Right 11/2012  . LAPAROSCOPIC CHOLECYSTECTOMY  11-19-2006  dr Zella Richer  [facility]  . LEFT HEART CATH AND CORONARY ANGIOGRAPHY N/A 01/18/2019   Procedure: LEFT HEART CATH AND CORONARY ANGIOGRAPHY;  Surgeon: Adrian Prows, MD;  Location: [place] [facility];  Service: Cardiovascular;  Laterality: N/A;  . LEFT HEART CATHETERIZATION WITH CORONARY ANGIOGRAM N/A 11/29/2013   Procedure: LEFT HEART CATHETERIZATION WITH CORONARY ANGIOGRAM;  Surgeon: Laverda Page, MD;  Location: [facility];  Service: Cardiovascular;  Laterality: N/A;  . TOTAL ABDOMINAL HYSTERECTOMY W/ BILATERAL SALPINGOOPHORECTOMY  06-20-2003    dr Mohammed Kindle  [facility]   endometrial cancer  .  TOTAL HIP ARTHROPLASTY Right 09/02/2013   Procedure: RIGHT TOTAL HIP ARTHROPLASTY ANTERIOR APPROACH;  Surgeon: Alta Corning, MD;  Location: [place];  Service: Orthopedics;  Laterality: Right;  . TOTAL KNEE ARTHROPLASTY Left 08/02/2012   Procedure: TOTAL KNEE ARTHROPLASTY;  Surgeon: Alta Corning, MD;  Location: [place];  Service: Orthopedics;  Laterality: Left;  left total knee arthroplasty  . TUBAL LIGATION  yrs ago    FAMILY HISTORY: The patient's family history includes Cancer in her sister; Diabetes in her brother, father, mother, and sister; Diabetes (age of onset: 74) in her sister; Diabetes type II in her father; Heart disease (age of onset: 27) in her brother and father; Heart disease (age of onset: 66) in her sister; High Cholesterol in her father and mother; Hypertension in her brother, father, mother, and sister; Lung cancer (age of onset: 25) in her father; Lupus in an other family member; Obesity in her mother; Pancreatic cancer in an other family member; Sudden death in her mother; Thyroid disease in her mother.   SOCIAL HISTORY:  The patient  reports that she  has never smoked. She has never used smokeless tobacco. She reports that she does not drink alcohol and does not use drugs.  Review of Systems  Constitutional: Positive for malaise/fatigue. Negative for chills and fever.  HENT: Negative for hoarse voice and nosebleeds.   Eyes: Negative for discharge, double vision and pain.  Cardiovascular: Positive for dyspnea on exertion. Negative for chest pain, claudication, leg swelling, near-syncope, orthopnea, palpitations, paroxysmal nocturnal dyspnea and syncope.  Respiratory: Negative for hemoptysis and shortness of breath.   Musculoskeletal: Negative for muscle cramps and myalgias.  Gastrointestinal: Negative for abdominal pain, constipation, diarrhea, hematemesis, hematochezia, melena, nausea and vomiting.  Neurological: Negative for dizziness and light-headedness.    PHYSICAL EXAM: Vitals with BMI 06/04/2020 04/30/2020 03/21/2020  Height 5\' 4"  5\' 4"  5\' 4"   Weight 255 lbs 6 oz 258 lbs 254 lbs  BMI 43.82 27.25 36.64  Systolic 403 474 259  Diastolic 74 68 68  Pulse 68 69 69    CONSTITUTIONAL: Well-developed and well-nourished. No acute distress.  SKIN: Skin is warm and dry. No rash noted. No cyanosis. No pallor. No jaundice HEAD: Normocephalic and atraumatic.  EYES: No scleral icterus MOUTH/THROAT: Moist oral membranes.  NECK: No JVD present. No thyromegaly noted. No carotid bruits  LYMPHATIC: No visible cervical adenopathy.  CHEST Normal respiratory effort. No intercostal retractions  LUNGS: Clear to auscultation bilaterally.  No stridor. No wheezes. No rales.  CARDIOVASCULAR: Regular rate and rhythm, positive S1-S2, no murmurs rubs or gallops appreciated. ABDOMINAL: No apparent ascites.  EXTREMITIES: Trace bilateral peripheral edema, warm to touch. HEMATOLOGIC: No significant bruising NEUROLOGIC: Oriented to person, place, and time. Nonfocal. Normal muscle tone.  PSYCHIATRIC: Normal mood and affect. Normal behavior. Cooperative  CARDIAC  DATABASE: EKG: 04/30/2020: Normal sinus rhythm, consider old anterior septal infarct, without underlying injury pattern.   06/04/2020: Normal sinus rhythm, 66 bpm, normal axis, without underlying injury pattern.  Echocardiogram: 56/38/7564 Normal LV systolic function with EF 66%. Left ventricle cavity is normal in size. Moderate concentric hypertrophy of the left ventricle. Normal global wall motion. Doppler evidence of grade I (impaired) diastolic dysfunction, normal LAP. Calculated EF 66%. Left atrial cavity is moderately dilated at 4.5 cm. Right ventricle cavity is mildly dilated. Normal right ventricular function. Compared to the study done on 12/21/2013, no significant change.    Stress Testing:  Leane Call Stress Test 12/27/2018: 1.  Resting EKG normal  sinus rhythm, stress EKG nondiagnostic due to pharmacologic stress. 2.  Left ventricle is dilated both in rest and stress images, LV end-diastolic volume 268 mL. Perfusion images reveal a small sized mild to moderate amount of ischemia in the apical inferior and periapical region.  LVEF elevated at 47%.  This is an intermediate risk study. No previous exam available for comparison.  Heart Catheterization: 01/18/2019: Normal LV systolic function, normal LVEDP. No pressure gradient across the aortic valve. RCA is dominant and has mild disease. Circumflex coronary artery is moderate-sized vessel with mild disease. LAD is a large-caliber vessel, with high-grade 95% proximal stenosis followed by tandem 70 to 80% stenosis. Moderate amount of calcification evident. Successful orbital atherectomy followed by stenting with 3.5 x 30 mm resolute Onyx at 12 atmospheric pressure for 60 seconds followed by postdilatation with 3.5 x 18 mm Sarpy balloon at 16 atmospheric pressure x2. Stenosis reduced to 0% with TIMI-3 to TIMI-3 flow.  Carotid duplex: [12/27/2013]: No evidence of hemodynamically significant stenosis in the bilateral carotid  bifurcation vessels.  LABORATORY DATA: CBC Latest Ref Rng & Units 01/14/2019 07/09/2018 07/07/2018  WBC 3.4 - 10.8 x10E3/uL 7.9 15.7(H) 17.1(H)  Hemoglobin 11.1 - 15.9 g/dL 13.1 13.3 12.9  Hematocrit 34.0 - 46.6 % 39.6 41.4 38.7  Platelets 150 - 450 x10E3/uL 282 334 318    CMP Latest Ref Rng & Units 05/15/2020 05/01/2020 03/21/2020  Glucose 65 - 99 mg/dL 146(H) 134(H) 169(H)  BUN 8 - 27 mg/dL 20 17 22   Creatinine 0.57 - 1.00 mg/dL 0.84 1.00 0.90  Sodium 134 - 144 mmol/L 142 143 140  Potassium 3.5 - 5.2 mmol/L 4.6 5.8(H) 4.7  Chloride 96 - 106 mmol/L 101 102 98  CO2 20 - 29 mmol/L 24 23 24   Calcium 8.7 - 10.3 mg/dL 9.5 9.7 9.8  Total Protein 6.0 - 8.5 g/dL - 6.8 -  Total Bilirubin 0.0 - 1.2 mg/dL - 0.3 -  Alkaline Phos 44 - 121 IU/L - 60 -  AST 0 - 40 IU/L - 24 -  ALT 0 - 32 IU/L - 35(H) -    Lipid Panel     Component Value Date/Time   CHOL 91 (L) 05/01/2020 1145   TRIG 280 (H) 05/01/2020 1145   HDL 39 (L) 05/01/2020 1145   CHOLHDL 5.9 (H) 07/16/2015 1339   VLDL NOT CALC 07/16/2015 1339   LDLCALC 12 05/01/2020 1145   LDLDIRECT 15 05/01/2020 1145   LABVLDL 40 05/01/2020 1145    Lab Results  Component Value Date   HGBA1C 7.4 (H) 10/27/2018   HGBA1C 6.7 (H) 02/01/2018   HGBA1C 6.7 (H) 10/06/2017   No components found for: NTPROBNP Lab Results  Component Value Date   TSH 1.960 03/21/2020   TSH 2.300 10/27/2018   TSH 3.010 04/29/2017    Cardiac Panel (last 3 results) No results for input(s): CKTOTAL, CKMB, TROPONINIHS, RELINDX in the last 72 hours.  IMPRESSION:    ICD-10-CM   1. Coronary artery disease involving native coronary artery of native heart without angina pectoris  I25.10 EKG 12-Lead    eplerenone (INSPRA) 25 MG tablet    carvedilol (COREG) 6.25 MG tablet    PCV ECHOCARDIOGRAM COMPLETE  2. Essential hypertension  T41 Basic metabolic panel    Pro b natriuretic peptide (BNP)    Magnesium  3. Hypertriglyceridemia  E78.1 Lipid Panel With LDL/HDL Ratio     LDL cholesterol, direct  4. Shortness of breath  R06.02   5. Status post angioplasty with  stent  Z95.820   6. Type 2 diabetes mellitus without complication, without long-term current use of insulin (HCC)  E11.9   7. Class 3 severe obesity with serious comorbidity and body mass index (BMI) of 40.0 to 44.9 in adult, unspecified obesity type [facility])  E66.01    Z68.41      RECOMMENDATIONS: [name] [name] is a 70 y.o. female whose past medical history and cardiovascular risk factors include: Diabetes mellitus type 2, morbid obesity, obstructive sleep apnea on CPAP, mixed hyperlipidemia, chronic palpitations with history of PACs and PVCs, CAD status post PCI to the LAD 01/18/2019, postmenopausal female, advanced age.  Hypertriglyceridemia:  Patient states that since March 2022 she has reduced her intake of carbohydrates such as potatoes.  She has not been on Vascepa for the last 2 weeks as well.  Instead of starting fenofibrate the shared decision was to restart Vascepa for now.  We will discontinue bisoprolol as it can sometimes contribute to hypertriglyceridemia.  We will transition her to carvedilol.  Established coronary artery disease status post PCI to the LAD without angina pectoris:  No chest pain or angina pectoris.  Continue current medical therapy.  Most recent echocardiogram and nuclear stress test results reviewed.  Independently reviewed labs from March 2022.  Patient is complaining of being tired and fatigue and at times sleeping throughout the day.  I will recheck an echocardiogram to reevaluate LVEF and regional wall motion abnormalities.  Shortness of breath:   Shortness of breath has improved since last office visit.  She is also lost 3 pounds and has been mindful of her salt intake.  We will continue to monitor.  Benign essential hypertension:  Office blood pressure is within acceptable range.  Recent blood work from March 2022 independently  reviewed.  Continue principal care management for ambulatory blood pressure monitoring.  Since last visit she has discontinued spironolactone due to hyperkalemia.  We will start eplerenone.  Change bisoprolol to carvedilol as discussed above  Blood pressure log reviewed.    Low-salt diet recommended.  Mixed hyperlipidemia: Continue statin therapy and Praluent.  LDL levels are very well controlled.  The plan of care care as discussed at today's office is to: #1 stop metoprolol and start carvedilol #2 start taking eplerenone and Vascepa in 2 weeks later get blood work done to evaluate her lipids, electrolytes, renal function, and BNP.  FINAL MEDICATION LIST END OF ENCOUNTER: Meds ordered this encounter  Medications  . eplerenone (INSPRA) 25 MG tablet    Sig: Take 1 tablet (25 mg total) by mouth every morning.    Dispense:  90 tablet    Refill:  0  . carvedilol (COREG) 6.25 MG tablet    Sig: Take 1 tablet (6.25 mg total) by mouth 2 (two) times daily.    Dispense:  60 tablet    Refill:  2     Current Outpatient Medications:  .  ACCU-CHEK AVIVA PLUS test strip, U UTD, Disp: , Rfl: 5 .  ACCU-CHEK SOFTCLIX LANCETS lancets, U UTD, Disp: , Rfl: 11 .  acetaminophen (TYLENOL) 500 MG tablet, Take 1,000-1,500 mg by mouth 2 (two) times daily as needed (pain)., Disp: , Rfl:  .  Alirocumab (PRALUENT) 150 MG/ML SOAJ, Inject 1 mL into the skin every 14 (fourteen) days., Disp: 2 mL, Rfl: 6 .  atorvastatin (LIPITOR) 80 MG tablet, Take 80 mg by mouth every evening. , Disp: , Rfl: 5 .  carvedilol (COREG) 6.25 MG tablet, Take 1 tablet (6.25 mg total)  by mouth 2 (two) times daily., Disp: 60 tablet, Rfl: 2 .  cetirizine (ZYRTEC) 10 MG tablet, Take 10 mg by mouth at bedtime. , Disp: , Rfl:  .  chlorthalidone (HYGROTON) 25 MG tablet, Take 1 tablet (25 mg total) by mouth in the morning for 180 doses., Disp: 90 tablet, Rfl: 1 .  Cholecalciferol (VITAMIN D) 50 MCG (2000 UT) tablet, Take 2,000 Units by  mouth daily., Disp: , Rfl:  .  clopidogrel (PLAVIX) 75 MG tablet, Take 75 mg by mouth daily., Disp: , Rfl:  .  eplerenone (INSPRA) 25 MG tablet, Take 1 tablet (25 mg total) by mouth every morning., Disp: 90 tablet, Rfl: 0 .  escitalopram (LEXAPRO) 20 MG tablet, Take 20 mg by mouth every evening. , Disp: , Rfl:  .  glimepiride (AMARYL) 4 MG tablet, Take 4 mg by mouth daily with breakfast. , Disp: , Rfl:  .  losartan (COZAAR) 100 MG tablet, Take 1 tablet (100 mg total) by mouth daily., Disp: 90 tablet, Rfl: 1 .  MAGNESIUM-OXIDE 400 (241.3 Mg) MG tablet, Take 400 mg by mouth 3 (three) times daily. , Disp: , Rfl:  .  meloxicam (MOBIC) 15 MG tablet, Take 15 mg by mouth daily., Disp: , Rfl:  .  metFORMIN (GLUCOPHAGE) 1000 MG tablet, Take 1 tablet (1,000 mg total) by mouth 2 (two) times daily with a meal., Disp: 60 tablet, Rfl: 0 .  Misc Natural Products (LUTEIN 20 PO), Take 20 mg by mouth daily., Disp: , Rfl:  .  Multiple Vitamin (MULTIVITAMIN WITH MINERALS) TABS tablet, Take 1 tablet by mouth daily., Disp: , Rfl:  .  nitroGLYCERIN (NITROSTAT) 0.4 MG SL tablet, PLACE 1 TABLET UNDER THE TONGUE EVERY 5 MINUTES AS NEEDED FOR CHEST PAIN, Disp: 25 tablet, Rfl: 3 .  TRULICITY 3 ZO/1.0RU SOPN, Inject 3 mg into the skin every Friday. , Disp: , Rfl:  .  VASCEPA 1 g capsule, TAKE 2 CAPSULES(2 GRAMS) BY MOUTH TWICE DAILY, Disp: 120 capsule, Rfl: 5 .  pantoprazole (PROTONIX) 40 MG tablet, Take 1 tablet by mouth daily., Disp: , Rfl:   Orders Placed This Encounter  Procedures  . Basic metabolic panel  . Pro b natriuretic peptide (BNP)  . Magnesium  . Lipid Panel With LDL/HDL Ratio  . LDL cholesterol, direct  . EKG 12-Lead  . PCV ECHOCARDIOGRAM COMPLETE   --Continue cardiac medications as reconciled in final medication list. --No follow-ups on file. Or sooner if needed. --Continue follow-up with your primary care physician regarding the management of your other chronic comorbid conditions.  Patient's  questions and concerns were addressed to her satisfaction. She voices understanding of the instructions provided during this encounter.   This note was created using a voice recognition software as a result there may be grammatical errors inadvertently enclosed that do not reflect the nature of this encounter. Every attempt is made to correct such errors.  Rex Kras, [place], [facility]  Pager: [phone] Office: [phone]

## 2020-06-04 ENCOUNTER — Encounter: Payer: Self-pay | Admitting: Cardiology

## 2020-06-04 ENCOUNTER — Other Ambulatory Visit: Payer: Self-pay

## 2020-06-04 ENCOUNTER — Ambulatory Visit: Payer: Medicare Other | Admitting: Cardiology

## 2020-06-04 VITALS — BP 133/74 | HR 68 | Temp 98.3°F | Resp 16 | Ht 64.0 in | Wt 255.4 lb

## 2020-06-04 DIAGNOSIS — E119 Type 2 diabetes mellitus without complications: Secondary | ICD-10-CM

## 2020-06-04 DIAGNOSIS — R0602 Shortness of breath: Secondary | ICD-10-CM

## 2020-06-04 DIAGNOSIS — Z95820 Peripheral vascular angioplasty status with implants and grafts: Secondary | ICD-10-CM

## 2020-06-04 DIAGNOSIS — E781 Pure hyperglyceridemia: Secondary | ICD-10-CM

## 2020-06-04 DIAGNOSIS — I10 Essential (primary) hypertension: Secondary | ICD-10-CM

## 2020-06-04 DIAGNOSIS — I2510 Atherosclerotic heart disease of native coronary artery without angina pectoris: Secondary | ICD-10-CM

## 2020-06-04 MED ORDER — EPLERENONE 25 MG PO TABS
25.0000 mg | ORAL_TABLET | Freq: Every morning | ORAL | 0 refills | Status: DC
Start: 2020-06-04 — End: 2020-09-04

## 2020-06-04 MED ORDER — CARVEDILOL 6.25 MG PO TABS: 6.2500 mg | ORAL_TABLET | Freq: Two times a day (BID) | ORAL | 2 refills | Status: DC

## 2020-06-05 ENCOUNTER — Other Ambulatory Visit: Payer: Self-pay | Admitting: Cardiology

## 2020-06-12 ENCOUNTER — Other Ambulatory Visit: Payer: Self-pay

## 2020-06-12 DIAGNOSIS — E781 Pure hyperglyceridemia: Secondary | ICD-10-CM

## 2020-06-12 DIAGNOSIS — I10 Essential (primary) hypertension: Secondary | ICD-10-CM

## 2020-06-13 ENCOUNTER — Other Ambulatory Visit: Payer: Self-pay

## 2020-06-13 ENCOUNTER — Ambulatory Visit: Payer: Medicare Other

## 2020-06-13 DIAGNOSIS — I2510 Atherosclerotic heart disease of native coronary artery without angina pectoris: Secondary | ICD-10-CM

## 2020-06-14 ENCOUNTER — Other Ambulatory Visit: Payer: Self-pay

## 2020-06-14 MED ORDER — PRALUENT 150 MG/ML ~~LOC~~ SOAJ: 1.0000 mL | SUBCUTANEOUS | 6 refills | Status: DC

## 2020-06-24 ENCOUNTER — Other Ambulatory Visit: Payer: Self-pay | Admitting: Cardiology

## 2020-06-28 LAB — BASIC METABOLIC PANEL
BUN/Creatinine Ratio: 19 (ref 12–28)
BUN: 19 mg/dL (ref 8–27)
CO2: 25 mmol/L (ref 20–29)
Calcium: 9.9 mg/dL (ref 8.7–10.3)
Chloride: 97 mmol/L (ref 96–106)
Creatinine, Ser: 0.99 mg/dL (ref 0.57–1.00)
Glucose: 130 mg/dL — ABNORMAL HIGH (ref 65–99)
Potassium: 4.9 mmol/L (ref 3.5–5.2)
Sodium: 140 mmol/L (ref 134–144)
eGFR: 62 mL/min/{1.73_m2} (ref >59)

## 2020-06-28 LAB — LIPID PANEL WITH LDL/HDL RATIO
Cholesterol, Total: 100 mg/dL (ref 100–199)
HDL: 41 mg/dL (ref >39)
LDL Chol Calc (NIH): 15 mg/dL (ref 0–99)
LDL/HDL Ratio: 0.4 ratio (ref 0.0–3.2)
Triglycerides: 306 mg/dL — ABNORMAL HIGH (ref 0–149)
VLDL Cholesterol Cal: 44 mg/dL — ABNORMAL HIGH (ref 5–40)

## 2020-06-28 LAB — LDL CHOLESTEROL, DIRECT: LDL Direct: 19 mg/dL (ref 0–99)

## 2020-06-28 LAB — MAGNESIUM: Magnesium: 1.4 mg/dL — ABNORMAL LOW (ref 1.6–2.3)

## 2020-06-28 LAB — PRO B NATRIURETIC PEPTIDE: NT-Pro BNP: 56 pg/mL (ref 0–301)

## 2020-07-02 NOTE — Progress Notes (Signed)
Patient is aware she is going to try eating foods that contain mag rather then increasing medication. Do you want her to go redo labs?

## 2020-07-03 ENCOUNTER — Ambulatory Visit: Payer: Medicare Other | Admitting: Cardiology

## 2020-07-03 ENCOUNTER — Encounter: Payer: Self-pay | Admitting: Cardiology

## 2020-07-03 ENCOUNTER — Other Ambulatory Visit: Payer: Self-pay

## 2020-07-03 VITALS — BP 142/81 | HR 72 | Temp 98.0°F | Resp 16 | Ht 64.0 in | Wt 255.4 lb

## 2020-07-03 DIAGNOSIS — E782 Mixed hyperlipidemia: Secondary | ICD-10-CM

## 2020-07-03 DIAGNOSIS — E781 Pure hyperglyceridemia: Secondary | ICD-10-CM

## 2020-07-03 DIAGNOSIS — I10 Essential (primary) hypertension: Secondary | ICD-10-CM

## 2020-07-03 DIAGNOSIS — Z8616 Personal history of COVID-19: Secondary | ICD-10-CM

## 2020-07-03 DIAGNOSIS — Z95820 Peripheral vascular angioplasty status with implants and grafts: Secondary | ICD-10-CM

## 2020-07-03 DIAGNOSIS — E119 Type 2 diabetes mellitus without complications: Secondary | ICD-10-CM

## 2020-07-03 DIAGNOSIS — I2510 Atherosclerotic heart disease of native coronary artery without angina pectoris: Secondary | ICD-10-CM

## 2020-07-03 MED ORDER — CARVEDILOL 12.5 MG PO TABS: 12.5000 mg | ORAL_TABLET | Freq: Two times a day (BID) | ORAL | 0 refills | Status: DC

## 2020-07-03 MED ORDER — MAGNESIUM OXIDE 400 MG PO CAPS: 800.0000 mg | ORAL_CAPSULE | Freq: Two times a day (BID) | ORAL | 0 refills | Status: DC

## 2020-07-03 NOTE — Progress Notes (Signed)
[name] [name] Date of Birth: 10-29-1950 MRN: [id number] [name] [name], [name] [name], [name] [name] Former Cardiology Providers: Jeri Lager, APRN, FNP-C Primary Cardiologist: Rex Kras, DO, [facility] (established care 11/03/2019)  Date: 07/03/20 Last Office Visit: 06/04/2020  Chief Complaint  Patient presents with  . Coronary artery disease involving native coronary artery of  . Hypertension  . Hyperlipidemia  . Follow-up    HPI  [name] [name] is a 70 y.o.  female who presents to the office with a chief complaint of " follow-up for CAD, blood pressure, and triglyceride management." Patient's past medical history and cardiovascular risk factors include: Diabetes mellitus type 2, morbid obesity, obstructive sleep apnea on CPAP, mixed hyperlipidemia, hypertriglyceridemia, chronic palpitations with history of PACs and PVCs, CAD status post PCI to the LAD 01/18/2019, postmenopausal female, advanced age.  She is being followed in clinic given her underlying coronary artery disease requiring prior coronary intervention and multiple cardiovascular risk factors.  Patient is currently being followed by principal care management for blood pressure management.  Patient is average blood pressure is within acceptable range but not at goal.  Medications reconciled.  Patient states that she does consume a low-salt diet.  Most recent lab work from 06/27/2020 independently reviewed with during today's office visit.  Patient's hyperkalemia has resolved after stopping spironolactone.  Lipid profile notes very acceptable LDL levels given her pharmacological therapy.  However, her triglyceride levels are still not well controlled.  Given her underlying CAD with prior coronary stenting recommended the importance of low carbohydrate diet.  Recommended that she request a consult with dietitian when she sees her PCP next.  Patient was also noted to have hypomagnesemia.  She takes 400 mg of magnesium oxide 3  times daily.  FUNCTIONAL STATUS: No structured exercise program or daily routine.   ALLERGIES: Allergies  Allergen Reactions  . Codeine Palpitations   MEDICATION LIST PRIOR TO VISIT: Current Outpatient Medications on File Prior to Visit  Medication Sig Dispense Refill  . ACCU-CHEK AVIVA PLUS test strip U UTD  5  . ACCU-CHEK SOFTCLIX LANCETS lancets U UTD  11  . acetaminophen (TYLENOL) 500 MG tablet Take 1,000-1,500 mg by mouth 2 (two) times daily as needed (pain).    . Alirocumab (PRALUENT) 150 MG/ML SOAJ Inject 1 mL into the skin every 14 (fourteen) days. 2 mL 6  . atorvastatin (LIPITOR) 80 MG tablet Take 80 mg by mouth every evening.   5  . cetirizine (ZYRTEC) 10 MG tablet Take 10 mg by mouth at bedtime.     . chlorthalidone (HYGROTON) 25 MG tablet Take 1 tablet (25 mg total) by mouth in the morning for 180 doses. 90 tablet 1  . Cholecalciferol (VITAMIN D) 50 MCG (2000 UT) tablet Take 2,000 Units by mouth daily.    . clopidogrel (PLAVIX) 75 MG tablet Take 75 mg by mouth daily.    Marland Kitchen eplerenone (INSPRA) 25 MG tablet Take 1 tablet (25 mg total) by mouth every morning. 90 tablet 0  . escitalopram (LEXAPRO) 20 MG tablet Take 20 mg by mouth every evening.     Marland Kitchen glimepiride (AMARYL) 4 MG tablet Take 4 mg by mouth daily with breakfast.     . losartan (COZAAR) 100 MG tablet Take 1 tablet (100 mg total) by mouth daily. 90 tablet 1  . meloxicam (MOBIC) 15 MG tablet Take 15 mg by mouth daily.    . metFORMIN (GLUCOPHAGE) 1000 MG tablet Take 1 tablet (1,000 mg total) by mouth 2 (two) times daily  with a meal. 60 tablet 0  . Misc Natural Products (LUTEIN 20 PO) Take 20 mg by mouth daily.    . Multiple Vitamin (MULTIVITAMIN WITH MINERALS) TABS tablet Take 1 tablet by mouth daily.    . nitroGLYCERIN (NITROSTAT) 0.4 MG SL tablet PLACE 1 TABLET UNDER THE TOUNGUE EVERY 5 MINUTES AS NEEDED FOR CHEST PAIN 25 tablet 3  . pantoprazole (PROTONIX) 40 MG tablet Take 1 tablet by mouth daily.    . TRULICITY 3  0000000 SOPN Inject 3 mg into the skin every Friday.     Marland Kitchen VASCEPA 1 g capsule TAKE 2 CAPSULES(2 GRAMS) BY MOUTH TWICE DAILY 120 capsule 5   No current facility-administered medications on file prior to visit.    PAST MEDICAL HISTORY: Past Medical History:  Diagnosis Date  . Anxiety   . Carpal tunnel syndrome of right wrist   . Coronary artery disease    per cath 11-29-2013  mild diffuse CAD and calcification in proximal LAD  . Depression   . Diverticulosis   . Essential hypertension   . GERD (gastroesophageal reflux disease)   . Heart disease   . History of endometrial cancer 05/2003   FIGO, Grade 1  s/p  TAG w/ BSO  . HTN (hypertension)   . Hyperlipidemia   . OA (osteoarthritis)   . OSA on CPAP    followed by dr Elsworth Soho-- per study 03-26-2012  mod. to severe OSA , AHI 37/hr  . Type II diabetes mellitus ([place])    followed by pcp  . Urinary incontinence in female   . Vitamin D deficiency     PAST SURGICAL HISTORY: Past Surgical History:  Procedure Laterality Date  . CARDIAC CATHETERIZATION  11/29/2013   dr Einar Gip   mild diffuse noncritial CAD and mild calcification involving the proximal LAD,  LVEF 55-60%  . CARPAL TUNNEL RELEASE Right 07/17/2017   Procedure: CARPAL TUNNEL RELEASE;  Surgeon: Dorna Leitz, MD;  Location: [place];  Service: Orthopedics;  Laterality: Right;  . CHOLECYSTECTOMY    . COLONOSCOPY WITH PROPOFOL N/A 01/31/2020   Procedure: COLONOSCOPY WITH PROPOFOL;  Surgeon: Juanita Craver, MD;  Location: [facility];  Service: Endoscopy;  Laterality: N/A;  . CORONARY ATHERECTOMY N/A 01/18/2019   Procedure: CORONARY ATHERECTOMY;  Surgeon: Adrian Prows, MD;  Location: [place] CV [facility];  Service: Cardiovascular;  Laterality: N/A;  . CORONARY STENT INTERVENTION N/A 01/18/2019   Procedure: CORONARY STENT INTERVENTION;  Surgeon: Adrian Prows, MD;  Location: [place]  CV LAB;  Service: Cardiovascular;  Laterality: N/A;  . KNEE ARTHROSCOPY Left 10/ 2004;   05-17-2008   dr graves  . KNEE ARTHROSCOPY Right 11/2012  . LAPAROSCOPIC CHOLECYSTECTOMY  11-19-2006  dr Zella Richer  [facility]  . LEFT HEART CATH AND CORONARY ANGIOGRAPHY N/A 01/18/2019   Procedure: LEFT HEART CATH AND CORONARY ANGIOGRAPHY;  Surgeon: Adrian Prows, MD;  Location: [place] [facility];  Service: Cardiovascular;  Laterality: N/A;  . LEFT HEART CATHETERIZATION WITH CORONARY ANGIOGRAM N/A 11/29/2013   Procedure: LEFT HEART CATHETERIZATION WITH CORONARY ANGIOGRAM;  Surgeon: Laverda Page, MD;  [facility];  Service: Cardiovascular;  Laterality: N/A;  . TOTAL ABDOMINAL HYSTERECTOMY W/ BILATERAL SALPINGOOPHORECTOMY  06-20-2003    dr Mohammed Kindle  [facility]   endometrial cancer  . TOTAL HIP ARTHROPLASTY Right 09/02/2013   Procedure: RIGHT TOTAL HIP ARTHROPLASTY ANTERIOR APPROACH;  Surgeon: Alta Corning, MD;  Location: [place];  Service: Orthopedics;  Laterality: Right;  . TOTAL KNEE ARTHROPLASTY Left 08/02/2012  Procedure: TOTAL KNEE ARTHROPLASTY;  Surgeon: Alta Corning, MD;  Location: [place];  Service: Orthopedics;  Laterality: Left;  left total knee arthroplasty  . TUBAL LIGATION  yrs ago    FAMILY HISTORY: The patient's family history includes Cancer in her sister; Diabetes in her brother, father, mother, and sister; Diabetes (age of onset: 70) in her sister; Diabetes type II in her father; Heart disease (age of onset: 2) in her brother and father; Heart disease (age of onset: 35) in her sister; High Cholesterol in her father and mother; Hypertension in her brother, father, mother, and sister; Lung cancer (age of onset: 31) in her father; Lupus in an other family member; Obesity in her mother; Pancreatic cancer in an other family member; Sudden death in her mother; Thyroid disease in her mother.   SOCIAL HISTORY:  The patient  reports that she has never smoked. She has never used smokeless tobacco. She reports that she does not drink alcohol and does not use drugs.  Review of Systems   Constitutional: Negative for chills, fever and malaise/fatigue.  HENT: Negative for hoarse voice and nosebleeds.   Eyes: Negative for discharge, double vision and pain.  Cardiovascular: Positive for dyspnea on exertion. Negative for chest pain, claudication, leg swelling, near-syncope, orthopnea, palpitations, paroxysmal nocturnal dyspnea and syncope.  Respiratory: Negative for hemoptysis and shortness of breath.   Musculoskeletal: Negative for muscle cramps and myalgias.  Gastrointestinal: Negative for abdominal pain, constipation, diarrhea, hematemesis, hematochezia, melena, nausea and vomiting.  Neurological: Negative for dizziness and light-headedness.    PHYSICAL EXAM: Vitals with BMI 07/03/2020 06/04/2020 04/30/2020  Height 5\' 4"  5\' 4"  5\' 4"   Weight 255 lbs 6 oz 255 lbs 6 oz 258 lbs  BMI 43.82 [id number] [id number]  Systolic A999333 [id number] A999333  Diastolic 81 74 68  Pulse 72 68 69    CONSTITUTIONAL: Well-developed and well-nourished. No acute distress.  SKIN: Skin is warm and dry. No rash noted. No cyanosis. No pallor. No jaundice HEAD: Normocephalic and atraumatic.  EYES: No scleral icterus MOUTH/THROAT: Moist oral membranes.  NECK: No JVD present. No thyromegaly noted. No carotid bruits  LYMPHATIC: No visible cervical adenopathy.  CHEST Normal respiratory effort. No intercostal retractions  LUNGS: Clear to auscultation bilaterally.  No stridor. No wheezes. No rales.  CARDIOVASCULAR: Regular rate and rhythm, positive S1-S2, no murmurs rubs or gallops appreciated. ABDOMINAL: No apparent ascites.  EXTREMITIES: Trace bilateral peripheral edema, warm to touch. HEMATOLOGIC: No significant bruising NEUROLOGIC: Oriented to person, place, and time. Nonfocal. Normal muscle tone.  PSYCHIATRIC: Normal mood and affect. Normal behavior. Cooperative  CARDIAC DATABASE: EKG: 04/30/2020: Normal sinus rhythm, consider old anterior septal infarct, without underlying injury pattern.   06/04/2020: Normal sinus  rhythm, 66 bpm, normal axis, without underlying injury pattern.  Echocardiogram: 06/13/2020:   Poor echo window. Normal LV systolic function with EF 70%. Left ventricle cavity is normal in size. Mild concentric remodeling of the left  ventricle. Normal global wall motion. Doppler evidence of grade I (impaired) diastolic dysfunction, normal LAP. Calculated EF 70%.  Left atrial cavity is mildly dilated at 3.9 cm.  Right ventricle cavity is slightly dilated. Normal right ventricular function.  No significant change from 11/16/2018.  Stress Testing:  Leane Call Stress Test 12/27/2018: 1.  Resting EKG normal sinus rhythm, stress EKG nondiagnostic due to pharmacologic stress. 2.  Left ventricle is dilated both in rest and stress images, LV end-diastolic volume 123456 mL. Perfusion images reveal a small sized mild to moderate amount of  ischemia in the apical inferior and periapical region.  LVEF elevated at 47%.  This is an intermediate risk study. No previous exam available for comparison.  Heart Catheterization: 01/18/2019: Normal LV systolic function, normal LVEDP. No pressure gradient across the aortic valve. RCA is dominant and has mild disease. Circumflex coronary artery is moderate-sized vessel with mild disease. LAD is a large-caliber vessel, with high-grade 95% proximal stenosis followed by tandem 70 to 80% stenosis. Moderate amount of calcification evident. Successful orbital atherectomy followed by stenting with 3.5 x 30 mm resolute [place]yx at 12 atmospheric pressure for 60 seconds followed by postdilatation with 3.5 x 18 mm [place] balloon at 16 atmospheric pressure x2. Stenosis reduced to 0% with TIMI-3 to TIMI-3 flow.  Carotid duplex: [12/27/2013]: No evidence of hemodynamically significant stenosis in the bilateral carotid bifurcation vessels.  LABORATORY DATA: CBC Latest Ref Rng & Units 01/14/2019 07/09/2018 07/07/2018  WBC 3.4 - 10.8 x10E3/uL 7.9 15.7(H) 17.1(H)  Hemoglobin 11.1 -  15.9 g/dL 13.1 13.3 12.9  Hematocrit 34.0 - 46.6 % 39.6 41.4 38.7  Platelets 150 - 450 x10E3/uL 282 334 318    CMP Latest Ref Rng & Units 06/27/2020 05/15/2020 05/01/2020  Glucose 65 - 99 mg/dL 130(H) 146(H) 134(H)  BUN 8 - 27 mg/dL 19 20 17   Creatinine 0.57 - 1.00 mg/dL 0.99 0.84 1.00  Sodium 134 - 144 mmol/L 140 142 143  Potassium 3.5 - 5.2 mmol/L 4.9 4.6 5.8(H)  Chloride 96 - 106 mmol/L 97 101 102  CO2 20 - 29 mmol/L 25 24 23   Calcium 8.7 - 10.3 mg/dL 9.9 9.5 9.7  Total Protein 6.0 - 8.5 g/dL - - 6.8  Total Bilirubin 0.0 - 1.2 mg/dL - - 0.3  Alkaline Phos 44 - 121 IU/L - - 60  AST 0 - 40 IU/L - - 24  ALT 0 - 32 IU/L - - 35(H)    Lipid Panel     Component Value Date/Time   CHOL 100 06/27/2020 0933   TRIG 306 (H) 06/27/2020 0933   HDL 41 06/27/2020 0933   CHOLHDL 5.9 (H) 07/16/2015 1339   VLDL NOT CALC 07/16/2015 1339   LDLCALC 15 06/27/2020 0933   LDLDIRECT 19 06/27/2020 0933   LABVLDL 44 (H) 06/27/2020 0933    Lab Results  Component Value Date   HGBA1C 7.4 (H) 10/27/2018   HGBA1C 6.7 (H) 02/01/2018   HGBA1C 6.7 (H) 10/06/2017   No components found for: NTPROBNP Lab Results  Component Value Date   TSH 1.960 03/21/2020   TSH 2.300 10/27/2018   TSH 3.010 04/29/2017    Cardiac Panel (last 3 results) No results for input(s): CKTOTAL, CKMB, TROPONINIHS, RELINDX in the last 72 hours.  IMPRESSION:    ICD-10-CM   1. Essential hypertension  I10 carvedilol (COREG) 12.5 MG tablet  2. Coronary artery disease involving native coronary artery of native heart without angina pectoris  I25.10   3. Status post angioplasty with stent  Z95.820   4. Hypertriglyceridemia  E78.1   5. Type 2 diabetes mellitus without complication, without long-term current use of insulin (HCC)  E11.9   6. Mixed hyperlipidemia  E78.2   7. History of COVID-19  Z86.16   8. Hypomagnesemia  E83.42 Magnesium Oxide 400 MG CAPS     RECOMMENDATIONS: XIAMARA HULET is a 70 y.o. female whose past medical  history and cardiovascular risk factors include: Diabetes mellitus type 2, morbid obesity, obstructive sleep apnea on CPAP, mixed hyperlipidemia, chronic palpitations with history of PACs and  PVCs, CAD status post PCI to the LAD 01/18/2019, postmenopausal female, advanced age.  Hypertriglyceridemia:  Patient is currently on Vascepa given her underlying CAD and prior stenting.  Repeat fasting lipid profile in May 2022 notes the triglyceride levels to be not well controlled.  I suspect the majority of this is due to dietary indiscretion.  Instead of additional pharmacological therapy I requested that she follows up with her PCP and request a dietitian consult for further guidance.    Also discontinued bisoprolol which can sometimes contribute to hypertriglyceridemia and transition her to carvedilol which she is tolerating well.    We will repeat fasting lipid profile prior to her next visit.  Established coronary artery disease status post PCI to the LAD without angina pectoris:  No chest pain or angina pectoris.  Continue current medical therapy.  Most recent echocardiogram and nuclear stress test results reviewed.  Independently reviewed labs from May 2022.  Repeat echocardiogram notes preserved LVEF without any significant valvular heart disease.  Hypomagnesemia:  Most likely secondary to diuretic induced.  Currently on magnesium oxide 400 mg p.o. 3 times daily.  Recommended magnesium oxide 400 mg 2 tablets p.o. 3 times daily  Encouraged her to increase magnesium rich foods in her diet.  Benign essential hypertension:  Office blood pressure is within acceptable range.  Recent blood work from May 2022 independently reviewed.  Continue principal care management for ambulatory blood pressure monitoring.  Discontinued spironolactone due to hyperkalemia.  Tolerated eplerenone well without any side effects or intolerances  Tolerated transition from bisoprolol to carvedilol.  We  will increase carvedilol to 12.5 mg p.o. twice daily  Blood pressure log reviewed.    Low-salt diet recommended.  Mixed hyperlipidemia: Continue statin therapy and Praluent.  LDL levels are very well controlled.  FINAL MEDICATION LIST END OF ENCOUNTER: Meds ordered this encounter  Medications  . carvedilol (COREG) 12.5 MG tablet    Sig: Take 1 tablet (12.5 mg total) by mouth 2 (two) times daily.    Dispense:  180 tablet    Refill:  0  . Magnesium Oxide 400 MG CAPS    Sig: Take 2 capsules (800 mg total) by mouth 2 (two) times daily.    Dispense:  360 capsule    Refill:  0     Current Outpatient Medications:  .  ACCU-CHEK AVIVA PLUS test strip, U UTD, Disp: , Rfl: 5 .  ACCU-CHEK SOFTCLIX LANCETS lancets, U UTD, Disp: , Rfl: 11 .  acetaminophen (TYLENOL) 500 MG tablet, Take 1,000-1,500 mg by mouth 2 (two) times daily as needed (pain)., Disp: , Rfl:  .  Alirocumab (PRALUENT) 150 MG/ML SOAJ, Inject 1 mL into the skin every 14 (fourteen) days., Disp: 2 mL, Rfl: 6 .  atorvastatin (LIPITOR) 80 MG tablet, Take 80 mg by mouth every evening. , Disp: , Rfl: 5 .  carvedilol (COREG) 12.5 MG tablet, Take 1 tablet (12.5 mg total) by mouth 2 (two) times daily., Disp: 180 tablet, Rfl: 0 .  cetirizine (ZYRTEC) 10 MG tablet, Take 10 mg by mouth at bedtime. , Disp: , Rfl:  .  chlorthalidone (HYGROTON) 25 MG tablet, Take 1 tablet (25 mg total) by mouth in the morning for 180 doses., Disp: 90 tablet, Rfl: 1 .  Cholecalciferol (VITAMIN D) 50 MCG (2000 UT) tablet, Take 2,000 Units by mouth daily., Disp: , Rfl:  .  clopidogrel (PLAVIX) 75 MG tablet, Take 75 mg by mouth daily., Disp: , Rfl:  .  eplerenone (INSPRA) 25 MG  tablet, Take 1 tablet (25 mg total) by mouth every morning., Disp: 90 tablet, Rfl: 0 .  escitalopram (LEXAPRO) 20 MG tablet, Take 20 mg by mouth every evening. , Disp: , Rfl:  .  glimepiride (AMARYL) 4 MG tablet, Take 4 mg by mouth daily with breakfast. , Disp: , Rfl:  .  losartan (COZAAR)  100 MG tablet, Take 1 tablet (100 mg total) by mouth daily., Disp: 90 tablet, Rfl: 1 .  Magnesium Oxide 400 MG CAPS, Take 2 capsules (800 mg total) by mouth 2 (two) times daily., Disp: 360 capsule, Rfl: 0 .  meloxicam (MOBIC) 15 MG tablet, Take 15 mg by mouth daily., Disp: , Rfl:  .  metFORMIN (GLUCOPHAGE) 1000 MG tablet, Take 1 tablet (1,000 mg total) by mouth 2 (two) times daily with a meal., Disp: 60 tablet, Rfl: 0 .  Misc Natural Products (LUTEIN 20 PO), Take 20 mg by mouth daily., Disp: , Rfl:  .  Multiple Vitamin (MULTIVITAMIN WITH MINERALS) TABS tablet, Take 1 tablet by mouth daily., Disp: , Rfl:  .  nitroGLYCERIN (NITROSTAT) 0.4 MG SL tablet, PLACE 1 TABLET UNDER THE TOUNGUE EVERY 5 MINUTES AS NEEDED FOR CHEST PAIN, Disp: 25 tablet, Rfl: 3 .  pantoprazole (PROTONIX) 40 MG tablet, Take 1 tablet by mouth daily., Disp: , Rfl:  .  TRULICITY 3 0000000 SOPN, Inject 3 mg into the skin every Friday. , Disp: , Rfl:  .  VASCEPA 1 g capsule, TAKE 2 CAPSULES(2 GRAMS) BY MOUTH TWICE DAILY, Disp: 120 capsule, Rfl: 5  No orders of the defined types were placed in this encounter.  --Continue cardiac medications as reconciled in final medication list. --Return in about 6 months (around 01/03/2021) for Follow up, Lipid & TAGs. Or sooner if needed. --Continue follow-up with your primary care physician regarding the management of your other chronic comorbid conditions.  Patient's questions and concerns were addressed to her satisfaction. She voices understanding of the instructions provided during this encounter.   This note was created using a voice recognition software as a result there may be grammatical errors inadvertently enclosed that do not reflect the nature of this encounter. Every attempt is made to correct such errors.  Rex Kras, [place], [facility]  Pager: [phone] Office: [phone]

## 2020-07-06 ENCOUNTER — Other Ambulatory Visit: Payer: Self-pay

## 2020-07-06 DIAGNOSIS — Z95820 Peripheral vascular angioplasty status with implants and grafts: Secondary | ICD-10-CM

## 2020-07-06 DIAGNOSIS — I2510 Atherosclerotic heart disease of native coronary artery without angina pectoris: Secondary | ICD-10-CM

## 2020-07-06 DIAGNOSIS — I10 Essential (primary) hypertension: Secondary | ICD-10-CM

## 2020-07-14 ENCOUNTER — Telehealth: Payer: Medicare Other | Admitting: Physician Assistant

## 2020-07-14 DIAGNOSIS — J019 Acute sinusitis, unspecified: Secondary | ICD-10-CM

## 2020-07-14 DIAGNOSIS — B9689 Other specified bacterial agents as the cause of diseases classified elsewhere: Secondary | ICD-10-CM | POA: Diagnosis not present

## 2020-07-14 MED ORDER — AMOXICILLIN-POT CLAVULANATE 875-125 MG PO TABS: 1.0000 | ORAL_TABLET | Freq: Two times a day (BID) | ORAL | 0 refills | Status: DC

## 2020-07-14 NOTE — Progress Notes (Signed)
We are sorry that you are not feeling well.  Here is how we plan to help!  Based on what you have shared with me it looks like you have sinusitis.  Sinusitis is inflammation and infection in the sinus cavities of the head.  Based on your presentation I believe you most likely have Acute Bacterial Sinusitis.  This is an infection caused by bacteria and is treated with antibiotics. I have prescribed Augmentin 875mg/125mg one tablet twice daily with food, for 7 days. You may use an oral decongestant such as Mucinex D or if you have glaucoma or high blood pressure use plain Mucinex. Saline nasal spray help and can safely be used as often as needed for congestion.  If you develop worsening sinus pain, fever or notice severe headache and vision changes, or if symptoms are not better after completion of antibiotic, please schedule an appointment with a health care provider.    Sinus infections are not as easily transmitted as other respiratory infection, however we still recommend that you avoid close contact with loved ones, especially the very young and elderly.  Remember to wash your hands thoroughly throughout the day as this is the number one way to prevent the spread of infection!  Home Care:  Only take medications as instructed by your medical team.  Complete the entire course of an antibiotic.  Do not take these medications with alcohol.  A steam or ultrasonic humidifier can help congestion.  You can place a towel over your head and breathe in the steam from hot water coming from a faucet.  Avoid close contacts especially the very young and the elderly.  Cover your mouth when you cough or sneeze.  Always remember to wash your hands.  Get Help Right Away If:  You develop worsening fever or sinus pain.  You develop a severe head ache or visual changes.  Your symptoms persist after you have completed your treatment plan.  Make sure you  Understand these instructions.  Will watch your  condition.  Will get help right away if you are not doing well or get worse.  Your e-visit answers were reviewed by a board certified advanced clinical practitioner to complete your personal care plan.  Depending on the condition, your plan could have included both over the counter or prescription medications.  If there is a problem please reply  once you have received a response from your provider.  Your safety is important to us.  If you have drug allergies check your prescription carefully.    You can use MyChart to ask questions about today's visit, request a non-urgent call back, or ask for a work or school excuse for 24 hours related to this e-Visit. If it has been greater than 24 hours you will need to follow up with your provider, or enter a new e-Visit to address those concerns.  You will get an e-mail in the next two days asking about your experience.  I hope that your e-visit has been valuable and will speed your recovery. Thank you for using e-visits.  I provided 5 minutes of non face-to-face time during this encounter for chart review and documentation.   

## 2020-07-18 ENCOUNTER — Ambulatory Visit: Payer: Medicare Other | Attending: Internal Medicine

## 2020-07-18 DIAGNOSIS — Z20822 Contact with and (suspected) exposure to covid-19: Secondary | ICD-10-CM

## 2020-07-19 LAB — NOVEL CORONAVIRUS, NAA: SARS-CoV-2, NAA: NOT DETECTED

## 2020-07-19 LAB — SARS-COV-2, NAA 2 DAY TAT

## 2020-08-07 ENCOUNTER — Other Ambulatory Visit: Payer: Self-pay | Admitting: Cardiology

## 2020-08-07 DIAGNOSIS — I2510 Atherosclerotic heart disease of native coronary artery without angina pectoris: Secondary | ICD-10-CM

## 2020-08-12 ENCOUNTER — Telehealth: Payer: Medicare Other | Admitting: Family

## 2020-08-12 DIAGNOSIS — H109 Unspecified conjunctivitis: Secondary | ICD-10-CM | POA: Diagnosis not present

## 2020-08-12 MED ORDER — POLYMYXIN B-TRIMETHOPRIM 10000-0.1 UNIT/ML-% OP SOLN: 1.0000 [drp] | Freq: Four times a day (QID) | OPHTHALMIC | 0 refills | Status: DC

## 2020-08-12 NOTE — Progress Notes (Signed)
E-Visit for Pink Eye   We are sorry that you are not feeling well.  Here is how we plan to help!  Based on what you have shared with me it looks like you have conjunctivitis.  Conjunctivitis is a common inflammatory or infectious condition of the eye that is often referred to as "pink eye".  In most cases it is contagious (viral or bacterial). However, not all conjunctivitis requires antibiotics (ex. Allergic).  We have made appropriate suggestions for you based upon your presentation.  I have prescribed Polytrim Ophthalmic drops 1-2 drops 4 times a day times 5 days  Pink eye can be highly contagious.  It is typically spread through direct contact with secretions, or contaminated objects or surfaces that one may have touched.  Strict handwashing is suggested with soap and water is urged.  If not available, use alcohol based had sanitizer.  Avoid unnecessary touching of the eye.  If you wear contact lenses, you will need to refrain from wearing them until you see no white discharge from the eye for at least 24 hours after being on medication.  You should see symptom improvement in 1-2 days after starting the medication regimen.  Call us if symptoms are not improved in 1-2 days.  Home Care:  Wash your hands often!  Do not wear your contacts until you complete your treatment plan.  Avoid sharing towels, bed linen, personal items with a person who has pink eye.  See attention for anyone in your home with similar symptoms.  Get Help Right Away If:  Your symptoms do not improve.  You develop blurred or loss of vision.  Your symptoms worsen (increased discharge, pain or redness)  Your e-visit answers were reviewed by a board certified advanced clinical practitioner to complete your personal care plan.  Depending on the condition, your plan could have included both over the counter or prescription medications.  If there is a problem please reply  once you have received a response from your  provider.  Your safety is important to us.  If you have drug allergies check your prescription carefully.    You can use MyChart to ask questions about today's visit, request a non-urgent call back, or ask for a work or school excuse for 24 hours related to this e-Visit. If it has been greater than 24 hours you will need to follow up with your provider, or enter a new e-Visit to address those concerns.   You will get an e-mail in the next two days asking about your experience.  I hope that your e-visit has been valuable and will speed your recovery. Thank you for using e-visits.  Approximately 5 minutes was spent documenting and reviewing patient's chart.      

## 2020-09-03 ENCOUNTER — Other Ambulatory Visit: Payer: Self-pay | Admitting: Cardiology

## 2020-09-03 DIAGNOSIS — I2510 Atherosclerotic heart disease of native coronary artery without angina pectoris: Secondary | ICD-10-CM

## 2020-09-11 ENCOUNTER — Emergency Department (HOSPITAL_BASED_OUTPATIENT_CLINIC_OR_DEPARTMENT_OTHER): Payer: Medicare Other

## 2020-09-11 ENCOUNTER — Emergency Department (HOSPITAL_BASED_OUTPATIENT_CLINIC_OR_DEPARTMENT_OTHER)
Admission: EM | Admit: 2020-09-11 | Discharge: 2020-09-11 | Disposition: A | Discharge status: Home / Self Care | Payer: Medicare Other | Attending: Emergency Medicine | Admitting: Emergency Medicine

## 2020-09-11 ENCOUNTER — Encounter (HOSPITAL_BASED_OUTPATIENT_CLINIC_OR_DEPARTMENT_OTHER): Payer: Self-pay

## 2020-09-11 ENCOUNTER — Other Ambulatory Visit: Payer: Self-pay

## 2020-09-11 DIAGNOSIS — Z79899 Other long term (current) drug therapy: Secondary | ICD-10-CM | POA: Diagnosis not present

## 2020-09-11 DIAGNOSIS — M25572 Pain in left ankle and joints of left foot: Secondary | ICD-10-CM | POA: Diagnosis present

## 2020-09-11 DIAGNOSIS — Z96641 Presence of right artificial hip joint: Secondary | ICD-10-CM | POA: Diagnosis not present

## 2020-09-11 DIAGNOSIS — I2510 Atherosclerotic heart disease of native coronary artery without angina pectoris: Secondary | ICD-10-CM | POA: Insufficient documentation

## 2020-09-11 DIAGNOSIS — Z96652 Presence of left artificial knee joint: Secondary | ICD-10-CM | POA: Insufficient documentation

## 2020-09-11 DIAGNOSIS — Z7984 Long term (current) use of oral hypoglycemic drugs: Secondary | ICD-10-CM | POA: Diagnosis not present

## 2020-09-11 DIAGNOSIS — I10 Essential (primary) hypertension: Secondary | ICD-10-CM | POA: Insufficient documentation

## 2020-09-11 DIAGNOSIS — Z8542 Personal history of malignant neoplasm of other parts of uterus: Secondary | ICD-10-CM | POA: Diagnosis not present

## 2020-09-11 DIAGNOSIS — E119 Type 2 diabetes mellitus without complications: Secondary | ICD-10-CM | POA: Insufficient documentation

## 2020-09-11 MED ORDER — OXYCODONE-ACETAMINOPHEN 5-325 MG PO TABS: 2.0000 | ORAL_TABLET | ORAL | 0 refills | Status: DC | PRN

## 2020-09-11 MED ORDER — INDOMETHACIN 25 MG PO CAPS: 25.0000 mg | ORAL_CAPSULE | Freq: Three times a day (TID) | ORAL | 0 refills | Status: DC | PRN

## 2020-09-11 MED ORDER — OXYCODONE-ACETAMINOPHEN 5-325 MG PO TABS
2.0000 | ORAL_TABLET | Freq: Once | ORAL | Status: AC
  Administered 2020-09-11: 2 via ORAL
  Filled 2020-09-11: qty 2

## 2020-09-11 MED ORDER — INDOMETHACIN 25 MG PO CAPS
25.0000 mg | ORAL_CAPSULE | Freq: Once | ORAL | Status: AC
  Administered 2020-09-11: 25 mg via ORAL
  Filled 2020-09-11: qty 1

## 2020-09-11 NOTE — Discharge Instructions (Addendum)
Begin taking indomethacin as prescribed.  Take Percocet as prescribed as needed for pain.  Follow-up with primary doctor if not improving in the next 3 to 4 days, and return to the ER if you develop worsening pain, redness of the skin overlying her ankle, high fever, or other new and concerning symptoms.

## 2020-09-11 NOTE — ED Triage Notes (Signed)
Patient here POV from Home with Husband for Foot Pain (Left).  Patient does not recall any recent trauma or Injury. Pain began yesterday prior to bed and the patient took PO Tylenol and pain only worsened.  Ambulatory but Painful. GCS 15. A&Ox4.

## 2020-09-11 NOTE — ED Provider Notes (Signed)
[facility] EMERGENCY DEPT Provider Note   CSN: [id number] Arrival date & time: 09/11/20  0301     History Chief Complaint  Patient presents with   Foot Pain    Left    [name] [name] is a 70 y.o. female.  Patient is a 70 year old female with history of hypertension, diabetes, GERD.  Patient presenting today for evaluation of left ankle pain.  This started earlier this evening in the absence of any injury or trauma.  She describes severe pain when she attempts to bear weight and move.  She denies any fevers or chills.  She denies history of gout.  The history is provided by the patient.  Foot Pain This is a new problem. The current episode started 3 to 5 hours ago. The problem occurs constantly. The problem has been rapidly worsening. The symptoms are aggravated by walking. Nothing relieves the symptoms. She has tried acetaminophen for the symptoms. The treatment provided no relief.      Past Medical History:  Diagnosis Date   Anxiety    Carpal tunnel syndrome of right wrist    Coronary artery disease    per cath 11-29-2013  mild diffuse CAD and calcification in proximal LAD   Depression    Diverticulosis    Essential hypertension    GERD (gastroesophageal reflux disease)    Heart disease    History of endometrial cancer 05/2003   FIGO, Grade 1  s/p  TAG w/ BSO   HTN (hypertension)    Hyperlipidemia    OA (osteoarthritis)    OSA on CPAP    followed by dr Elsworth Soho-- per study 03-26-2012  mod. to severe OSA , AHI 37/hr   Type II diabetes mellitus ([place])    followed by pcp   Urinary incontinence in female    Vitamin D deficiency     Patient Active Problem List   Diagnosis Date Noted   Depression with anxiety 05/09/2020   Mild neurocognitive disorder 05/09/2020   Coronary artery disease 01/18/2019   Class 3 severe obesity with serious comorbidity and body mass index (BMI) of 40.0 to 44.9 in adult ([place]) 10/13/2018   DM (diabetes mellitus), type 2 ([place])  06/30/2018   Essential hypertension 06/30/2018   Right carpal tunnel syndrome 07/17/2017   Other fatigue 04/29/2017   Shortness of breath on exertion 04/29/2017   Vitamin D deficiency 04/29/2017   Hypersomnolence 28/31/5176   Nonalcoholic fatty liver disease without nonalcoholic steatohepatitis ([facility]) 02/12/2016   Gastroesophageal reflux disease with esophagitis 11/23/2014   History of endometrial cancer 08/25/2014   Mixed hyperlipidemia 08/25/2014   Morbid obesity with alveolar hypoventilation ([place]) 08/25/2014   Bilateral carotid bruits 11/30/2013   Dyspnea on exertion 11/30/2013   Angina pectoris ([place]) 11/29/2013   Osteoarthritis of right hip 09/02/2013   Cystocele 07/03/2013   Vaginal pessary present 04/25/2013   Prolapse of vaginal vault after hysterectomy 04/25/2013   Endometrial cancer, grade I ([place]) 04/25/2013   Unspecified vitamin D deficiency 04/25/2013   Personal history of other medical treatment 04/25/2013   Osteoarthritis of left knee 08/02/2012   Stress incontinence 06/07/2012   Essential hypertension with goal blood pressure less than 130/85 06/07/2012   Diabetes ([place]) 06/07/2012   Obstructive sleep apnea 02/16/2012    Past Surgical History:  Procedure Laterality Date   CARDIAC CATHETERIZATION  11/29/2013   dr Einar Gip   mild diffuse noncritial CAD and mild calcification involving the proximal LAD,  LVEF 55-60%   CARPAL TUNNEL RELEASE Right 07/17/2017  Procedure: CARPAL TUNNEL RELEASE;  Surgeon: Dorna Leitz, MD;  Location: [facility];  Service: Orthopedics;  Laterality: Right;   CHOLECYSTECTOMY     COLONOSCOPY WITH PROPOFOL N/A 01/31/2020   Procedure: COLONOSCOPY WITH PROPOFOL;  Surgeon: Juanita Craver, MD;  Location: [facility];  Service: Endoscopy;  Laterality: N/A;   CORONARY ATHERECTOMY N/A 01/18/2019   Procedure: CORONARY ATHERECTOMY;  Surgeon: Adrian Prows, MD;  Location: [facility];  Service: Cardiovascular;  Laterality: N/A;   CORONARY  STENT INTERVENTION N/A 01/18/2019   Procedure: CORONARY STENT INTERVENTION;  Surgeon: Adrian Prows, MD;  Location: [place] [facility];  Service: Cardiovascular;  Laterality: N/A;   KNEE ARTHROSCOPY Left 10/ 2004;  05-17-2008   dr graves   KNEE ARTHROSCOPY Right 11/2012   LAPAROSCOPIC CHOLECYSTECTOMY  11-19-2006  dr Zella Richer  [facility]   LEFT HEART CATH AND CORONARY ANGIOGRAPHY N/A 01/18/2019   Procedure: LEFT HEART CATH AND CORONARY ANGIOGRAPHY;  Surgeon: Adrian Prows, MD;  Location:  CV LAB;  Service: Cardiovascular;  Laterality: N/A;   LEFT HEART CATHETERIZATION WITH CORONARY ANGIOGRAM N/A 11/29/2013   Procedure: LEFT HEART CATHETERIZATION WITH CORONARY ANGIOGRAM;  Surgeon: Laverda Page, MD;  Location: [facility] CATH LAB;  Service: Cardiovascular;  Laterality: N/A;   TOTAL ABDOMINAL HYSTERECTOMY W/ BILATERAL SALPINGOOPHORECTOMY  06-20-2003    dr Mohammed Kindle  [facility]   endometrial cancer   TOTAL HIP ARTHROPLASTY Right 09/02/2013   Procedure: RIGHT TOTAL HIP ARTHROPLASTY ANTERIOR APPROACH;  Surgeon: Alta Corning, MD;  Location: [facility];  Service: Orthopedics;  Laterality: Right;   TOTAL KNEE ARTHROPLASTY Left 08/02/2012   Procedure: TOTAL KNEE ARTHROPLASTY;  Surgeon: Alta Corning, MD;  Location: [facility];  Service: Orthopedics;  Laterality: Left;  left total knee arthroplasty   TUBAL LIGATION  yrs ago     OB History     Gravida  3   Para  3   Term  3   Preterm  0   AB  0   Living  3      SAB  0   IAB  0   Ectopic  0   Multiple  0   Live Births  3           Family History  Problem Relation Age of Onset   Heart disease Father 24       CABG   Lung cancer Father 46       lung   Hypertension Father    Diabetes Father    High Cholesterol Father    Diabetes type II Father    Heart disease Sister 66       CABG   Diabetes Sister 69   Heart disease Brother 41       MI with stents   Hypertension Mother    Diabetes Mother    High Cholesterol Mother    Sudden death Mother     Thyroid disease Mother    Obesity Mother    Hypertension Sister        x2    Diabetes Sister    Hypertension Brother    Diabetes Brother    Cancer Sister        high grade Neuroendocrine carcinoma right axilla   Pancreatic cancer Other        x 3 - 2 MAunts and 1 P Aunts.    Lupus Other        Pat Aunt   Dementia Neg Hx    Alzheimer's  disease Neg Hx     Social History   Tobacco Use   Smoking status: Never   Smokeless tobacco: Never  Vaping Use   Vaping Use: Never used  Substance Use Topics   Alcohol use: Never   Drug use: Never    Home Medications Prior to Admission medications   Medication Sig Start Date End Date Taking? Authorizing Provider  ACCU-CHEK AVIVA PLUS test strip U UTD 06/12/17   [provider]  ACCU-CHEK SOFTCLIX LANCETS lancets U UTD 06/12/17   [provider]  acetaminophen (TYLENOL) 500 MG tablet Take 1,000-1,500 mg by mouth 2 (two) times daily as needed (pain).    [provider]  Alirocumab (PRALUENT) 150 MG/ML SOAJ Inject 1 mL into the skin every 14 (fourteen) days. 06/14/20   Tolia, Sunit, DO  amoxicillin-clavulanate (AUGMENTIN) 875-125 MG tablet Take 1 tablet by mouth 2 (two) times daily. 07/14/20   Mar Daring, PA-C  atorvastatin (LIPITOR) 80 MG tablet Take 80 mg by mouth every evening.  04/11/15   [provider]  carvedilol (COREG) 12.5 MG tablet Take 1 tablet (12.5 mg total) by mouth 2 (two) times daily. 07/03/20 10/01/20  Tolia, Sunit, DO  cetirizine (ZYRTEC) 10 MG tablet Take 10 mg by mouth at bedtime.     [provider]  chlorthalidone (HYGROTON) 25 MG tablet Take 1 tablet (25 mg total) by mouth in the morning for 180 doses. 11/07/19 05/05/20  Tolia, Sunit, DO  Cholecalciferol (VITAMIN D) 50 MCG (2000 UT) tablet Take 2,000 Units by mouth daily.    [provider]  clopidogrel (PLAVIX) 75 MG tablet Take 75 mg by mouth daily.    [provider]  eplerenone (INSPRA) 25 MG tablet TAKE  1 TABLET(25 MG) BY MOUTH EVERY MORNING 09/04/20   Tolia, Sunit, DO  escitalopram (LEXAPRO) 20 MG tablet Take 20 mg by mouth every evening.     [provider]  glimepiride (AMARYL) 4 MG tablet Take 4 mg by mouth daily with breakfast.  02/14/18   [provider]  losartan (COZAAR) 100 MG tablet Take 1 tablet (100 mg total) by mouth daily. 11/07/19 05/05/20  Tolia, Sunit, DO  Magnesium Oxide 400 MG CAPS Take 2 capsules (800 mg total) by mouth 2 (two) times daily. 07/03/20 10/01/20  Tolia, Sunit, DO  meloxicam (MOBIC) 15 MG tablet Take 15 mg by mouth daily.    [provider]  metFORMIN (GLUCOPHAGE) 1000 MG tablet Take 1 tablet (1,000 mg total) by mouth 2 (two) times daily with a meal. 01/20/19   Adrian Prows, MD  Misc Natural Products (LUTEIN 20 PO) Take 20 mg by mouth daily.    [provider]  Multiple Vitamin (MULTIVITAMIN WITH MINERALS) TABS tablet Take 1 tablet by mouth daily.    [provider]  nitroGLYCERIN (NITROSTAT) 0.4 MG SL tablet PLACE 1 TABLET UNDER THE TOUNGUE EVERY 5 MINUTES AS NEEDED FOR CHEST PAIN 06/05/20   Tolia, Sunit, DO  pantoprazole (PROTONIX) 40 MG tablet Take 1 tablet by mouth daily. 05/05/20   [provider]  trimethoprim-polymyxin b (POLYTRIM) ophthalmic solution Place 1 drop into the left eye every 6 (six) hours. 08/12/20   Sharion Balloon, FNP  TRULICITY 3 UM/3.5TI SOPN Inject 3 mg into the skin every Friday.  01/06/20   [provider]  VASCEPA 1 g capsule TAKE 2 CAPSULES(2 GRAMS) BY MOUTH TWICE DAILY 04/11/20   Tolia, Sunit, DO    Allergies    Codeine  Review of  Systems   Review of Systems  All other systems reviewed and are negative.  Physical Exam Updated Vital Signs BP (!) 173/87 (BP Location: Right Arm)   Pulse 72   Temp 99.1 F (37.3 C) (Oral)   Resp 20   Ht 5\' 4"  (1.626 m)   Wt 111.6 kg   LMP 11/25/2002 (Approximate)   SpO2 98%   BMI 42.23 kg/m   Physical Exam Vitals and nursing note  reviewed.  Constitutional:      General: She is not in acute distress.    Appearance: Normal appearance. She is not ill-appearing.  HENT:     Head: Normocephalic and atraumatic.  Pulmonary:     Effort: Pulmonary effort is normal.  Musculoskeletal:     Comments: The left ankle is grossly normal in appearance.  There is no erythema or warmth.  DP pulses are easily palpable and motor and sensation are intact throughout the entire foot.  She does have pain with range of motion of the ankle.  Skin:    General: Skin is warm and dry.  Neurological:     Mental Status: She is alert.    ED Results / Procedures / Treatments   Labs (all labs ordered are listed, but only abnormal results are displayed) Labs Reviewed - No data to display  EKG None  Radiology No results found.  Procedures Procedures   Medications Ordered in ED Medications  oxyCODONE-acetaminophen (PERCOCET/ROXICET) 5-325 MG per tablet 2 tablet (has no administration in time range)    ED Course  I have reviewed the triage vital signs and the nursing notes.  Pertinent labs & imaging results that were available during my care of the patient were reviewed by me and considered in my medical decision making (see chart for details).    MDM Rules/Calculators/A&P  Patient presenting with nontraumatic left ankle pain.  She has pain with range of motion and examines like this is gout.  Patient will be treated with indomethacin, pain medication, and follow-up if not improving.  I have considered a septic joint, but I doubt this to be the case.  She has no fever and no reason for this.    Final Clinical Impression(s) / ED Diagnoses Final diagnoses:  None    Rx / DC Orders ED Discharge Orders     None        Veryl Speak, MD 09/11/20 [phone]

## 2020-09-27 ENCOUNTER — Other Ambulatory Visit: Payer: Self-pay | Admitting: Cardiology

## 2020-09-28 ENCOUNTER — Ambulatory Visit: Payer: Medicare Other

## 2020-10-05 ENCOUNTER — Ambulatory Visit: Payer: Medicare Other | Attending: Internal Medicine

## 2020-10-05 ENCOUNTER — Other Ambulatory Visit: Payer: Self-pay | Admitting: Cardiology

## 2020-10-05 DIAGNOSIS — Z23 Encounter for immunization: Secondary | ICD-10-CM

## 2020-10-05 NOTE — Progress Notes (Signed)
   Covid-19 Vaccination Clinic  Name:  [name] [name]    MRN: [id number] DOB: 10-11-1950  10/05/2020  [name] [name] was observed post Covid-19 immunization for 15 minutes without incident. She was provided with Vaccine Information Sheet and instruction to access the V-Safe system.   [name] [name] was instructed to call 911 with any severe reactions post vaccine: Difficulty breathing  Swelling of face and throat  A fast heartbeat  A bad rash all over body  Dizziness and weakness   Immunizations Administered     Name Date Dose VIS Date Route   PFIZER Comrnaty(Gray TOP) Covid-19 Vaccine 10/05/2020 11:41 AM 0.3 mL 02/02/2020 Intramuscular   Manufacturer: [employer]   Lot: [id number]   NDC: [phone]

## 2020-10-15 ENCOUNTER — Other Ambulatory Visit (HOSPITAL_BASED_OUTPATIENT_CLINIC_OR_DEPARTMENT_OTHER): Payer: Self-pay

## 2020-10-15 MED ORDER — COVID-19 MRNA VAC-TRIS(PFIZER) 30 MCG/0.3ML IM SUSP
INTRAMUSCULAR | 0 refills | Status: DC
  Filled 2020-10-15: qty 0.3, 1d supply, fill #0

## 2020-10-25 LAB — CMP14+EGFR
ALT: 26 IU/L (ref 0–32)
AST: 18 IU/L (ref 0–40)
Albumin/Globulin Ratio: 1.8 (ref 1.2–2.2)
Albumin: 4.4 g/dL (ref 3.8–4.8)
Alkaline Phosphatase: 67 IU/L (ref 44–121)
BUN/Creatinine Ratio: 18 (ref 12–28)
BUN: 20 mg/dL (ref 8–27)
Bilirubin Total: 0.3 mg/dL (ref 0.0–1.2)
CO2: 25 mmol/L (ref 20–29)
Calcium: 10.1 mg/dL (ref 8.7–10.3)
Chloride: 99 mmol/L (ref 96–106)
Creatinine, Ser: 1.09 mg/dL — ABNORMAL HIGH (ref 0.57–1.00)
Globulin, Total: 2.5 g/dL (ref 1.5–4.5)
Glucose: 141 mg/dL — ABNORMAL HIGH (ref 65–99)
Potassium: 4.9 mmol/L (ref 3.5–5.2)
Sodium: 141 mmol/L (ref 134–144)
Total Protein: 6.9 g/dL (ref 6.0–8.5)
eGFR: 55 mL/min/{1.73_m2} — ABNORMAL LOW (ref >59)

## 2020-10-25 LAB — LIPID PANEL WITH LDL/HDL RATIO
Cholesterol, Total: 108 mg/dL (ref 100–199)
HDL: 40 mg/dL (ref >39)
LDL Chol Calc (NIH): 24 mg/dL (ref 0–99)
LDL/HDL Ratio: 0.6 ratio (ref 0.0–3.2)
Triglycerides: 300 mg/dL — ABNORMAL HIGH (ref 0–149)
VLDL Cholesterol Cal: 44 mg/dL — ABNORMAL HIGH (ref 5–40)

## 2020-10-25 LAB — MAGNESIUM: Magnesium: 1.5 mg/dL — ABNORMAL LOW (ref 1.6–2.3)

## 2020-10-25 LAB — LDL CHOLESTEROL, DIRECT: LDL Direct: 19 mg/dL (ref 0–99)

## 2020-10-26 ENCOUNTER — Other Ambulatory Visit: Payer: Self-pay

## 2020-10-26 MED ORDER — MAGNESIUM OXIDE -MG SUPPLEMENT 400 (240 MG) MG PO TABS
2.0000 | ORAL_TABLET | Freq: Three times a day (TID) | ORAL | 0 refills | Status: DC
Start: 2020-10-26 — End: 2021-04-16

## 2020-10-26 NOTE — Progress Notes (Signed)
Pt aware.

## 2020-10-31 ENCOUNTER — Ambulatory Visit: Payer: Medicare Other | Admitting: Cardiology

## 2020-11-07 ENCOUNTER — Other Ambulatory Visit: Payer: Self-pay | Admitting: Cardiology

## 2020-11-07 DIAGNOSIS — I10 Essential (primary) hypertension: Secondary | ICD-10-CM

## 2020-11-22 ENCOUNTER — Other Ambulatory Visit: Payer: Self-pay | Admitting: Pharmacist

## 2020-11-22 ENCOUNTER — Ambulatory Visit: Payer: Medicare Other | Admitting: Cardiology

## 2020-11-22 ENCOUNTER — Other Ambulatory Visit: Payer: Self-pay

## 2020-11-22 ENCOUNTER — Encounter: Payer: Self-pay | Admitting: Cardiology

## 2020-11-22 VITALS — BP 134/61 | HR 68 | Temp 97.8°F | Resp 16 | Ht 64.0 in | Wt 247.6 lb

## 2020-11-22 DIAGNOSIS — I10 Essential (primary) hypertension: Secondary | ICD-10-CM

## 2020-11-22 DIAGNOSIS — E782 Mixed hyperlipidemia: Secondary | ICD-10-CM

## 2020-11-22 DIAGNOSIS — Z95820 Peripheral vascular angioplasty status with implants and grafts: Secondary | ICD-10-CM

## 2020-11-22 DIAGNOSIS — I2510 Atherosclerotic heart disease of native coronary artery without angina pectoris: Secondary | ICD-10-CM

## 2020-11-22 DIAGNOSIS — Z8616 Personal history of COVID-19: Secondary | ICD-10-CM

## 2020-11-22 DIAGNOSIS — E119 Type 2 diabetes mellitus without complications: Secondary | ICD-10-CM

## 2020-11-22 DIAGNOSIS — E781 Pure hyperglyceridemia: Secondary | ICD-10-CM

## 2020-11-22 NOTE — Progress Notes (Signed)
[name] [name] Date of Birth: 08-13-1950 MRN: [id number] Primary Care Provider:Anderson, Rosey Bath, FNP Former Cardiology Providers: [place], APRN, FNP-C Primary Cardiologist: Tessa Lerner, DO, [facility] (established care 11/03/2019)  Date: 11/22/20 Last Office Visit: 07/03/2020  Chief Complaint  Patient presents with    TAGs   Hyperlipidemia   Follow-up    HPI  [name] [name] is a 70 y.o.  female who presents to the office with a chief complaint of " blood pressure and triglyceride management." Patient's past medical history and cardiovascular risk factors include: Diabetes mellitus type 2, morbid obesity, obstructive sleep apnea on CPAP, mixed hyperlipidemia, hypertriglyceridemia, chronic palpitations with history of PACs and PVCs, CAD status post PCI to the LAD 01/18/2019, postmenopausal female, advanced age.  Patient is being followed by the practice due to her underlying coronary disease status post PCI to the LAD and managing her modifiable cardiovascular risk factors.  She has been enrolled into principal care management and her systolic blood pressures are very well controlled over the last 2 weeks average SBP is 134 mmHg and diastolic blood pressure 69 mmHg.  Clinically she denies any chest pain or shortness of breath at rest or with effort related activities.  She does have baseline dyspnea which is chronic and stable.  No hospitalizations or urgent care visits for cardiovascular symptoms since last office encounter.  She had recent labs done in August 2022 which notes stable renal function, hypomagnesemia and she has uptitrated her magnesium dose, LDL is 19 mg/dL, but triglyceride levels remain elevated at 300 mg/dL.  FUNCTIONAL STATUS: No structured exercise program or daily routine.   ALLERGIES: Allergies  Allergen Reactions   Codeine Palpitations   MEDICATION LIST PRIOR TO VISIT: Current Outpatient Medications on File Prior to Visit  Medication Sig Dispense  Refill   ACCU-CHEK AVIVA PLUS test strip U UTD  5   ACCU-CHEK SOFTCLIX LANCETS lancets U UTD  11   acetaminophen (TYLENOL) 500 MG tablet Take 1,000-1,500 mg by mouth 2 (two) times daily as needed (pain).     Alirocumab (PRALUENT) 150 MG/ML SOAJ Inject 1 mL into the skin every 14 (fourteen) days. 2 mL 6   atorvastatin (LIPITOR) 80 MG tablet Take 80 mg by mouth every evening.   5   carvedilol (COREG) 12.5 MG tablet TAKE 1 TABLET(12.5 MG) BY MOUTH TWICE DAILY 180 tablet 0   cetirizine (ZYRTEC) 10 MG tablet Take 10 mg by mouth at bedtime.      chlorthalidone (HYGROTON) 25 MG tablet Take 1 tablet (25 mg total) by mouth in the morning for 180 doses. 90 tablet 1   Cholecalciferol (VITAMIN D) 50 MCG (2000 UT) tablet Take 2,000 Units by mouth daily.     clopidogrel (PLAVIX) 75 MG tablet Take 75 mg by mouth daily.     COVID-19 mRNA Vac-TriS, Pfizer, SUSP injection Inject into the muscle. 0.3 mL 0   eplerenone (INSPRA) 25 MG tablet TAKE 1 TABLET(25 MG) BY MOUTH EVERY MORNING 90 tablet 0   escitalopram (LEXAPRO) 20 MG tablet Take 20 mg by mouth every evening.      glimepiride (AMARYL) 4 MG tablet Take 4 mg by mouth daily with breakfast.      indomethacin (INDOCIN) 25 MG capsule Take 1 capsule (25 mg total) by mouth 3 (three) times daily as needed. 20 capsule 0   losartan (COZAAR) 100 MG tablet Take 1 tablet (100 mg total) by mouth daily. 90 tablet 1   MAGnesium-Oxide 400 (240 Mg) MG tablet Take 2  tablets (800 mg total) by mouth 3 (three) times daily. 360 tablet 0   meloxicam (MOBIC) 15 MG tablet Take 15 mg by mouth daily.     metFORMIN (GLUCOPHAGE) 1000 MG tablet Take 1 tablet (1,000 mg total) by mouth 2 (two) times daily with a meal. 60 tablet 0   Misc Natural Products (LUTEIN 20 PO) Take 20 mg by mouth daily.     Multiple Vitamin (MULTIVITAMIN WITH MINERALS) TABS tablet Take 1 tablet by mouth daily.     nitroGLYCERIN (NITROSTAT) 0.4 MG SL tablet PLACE 1 TABLET UNDER THE TONGUE EVERY 5 MINUTES AS NEEDED  FOR CHEST PAIN 25 tablet 3   pantoprazole (PROTONIX) 40 MG tablet Take 1 tablet by mouth daily.     TRULICITY 3 EQ/6.8TM SOPN Inject 3 mg into the skin every Friday.      VASCEPA 1 g capsule TAKE 2 CAPSULES(2 GRAMS) BY MOUTH TWICE DAILY 120 capsule 5   No current facility-administered medications on file prior to visit.    PAST MEDICAL HISTORY: Past Medical History:  Diagnosis Date   Anxiety    Carpal tunnel syndrome of right wrist    Coronary artery disease    per cath 11-29-2013  mild diffuse CAD and calcification in proximal LAD   Depression    Diverticulosis    Essential hypertension    GERD (gastroesophageal reflux disease)    Heart disease    History of endometrial cancer 05/2003   FIGO, Grade 1  s/p  TAG w/ BSO   HTN (hypertension)    Hyperlipidemia    OA (osteoarthritis)    OSA on CPAP    followed by dr Elsworth Soho-- per study 03-26-2012  mod. to severe OSA , AHI 37/hr   Type II diabetes mellitus ([place])    followed by pcp   Urinary incontinence in female    Vitamin D deficiency     PAST SURGICAL HISTORY: Past Surgical History:  Procedure Laterality Date   CARDIAC CATHETERIZATION  11/29/2013   dr Einar Gip   mild diffuse noncritial CAD and mild calcification involving the proximal LAD,  LVEF 55-60%   CARPAL TUNNEL RELEASE Right 07/17/2017   Procedure: CARPAL TUNNEL RELEASE;  Surgeon: Dorna Leitz, MD;  Location: [facility];  Service: Orthopedics;  Laterality: Right;   CHOLECYSTECTOMY     COLONOSCOPY WITH PROPOFOL N/A 01/31/2020   Procedure: COLONOSCOPY WITH PROPOFOL;  Surgeon: Juanita Craver, MD;  Location: [facility];  Service: Endoscopy;  Laterality: N/A;   CORONARY ATHERECTOMY N/A 01/18/2019   Procedure: CORONARY ATHERECTOMY;  Surgeon: Adrian Prows, MD;  Location: [place] [facility];  Service: Cardiovascular;  Laterality: N/A;   CORONARY STENT INTERVENTION N/A 01/18/2019   Procedure: CORONARY STENT INTERVENTION;  Surgeon: Adrian Prows, MD;  Location: [place]  [facility];  Service: Cardiovascular;  Laterality: N/A;   KNEE ARTHROSCOPY Left 10/ 2004;  05-17-2008   dr graves   KNEE ARTHROSCOPY Right 11/2012   LAPAROSCOPIC CHOLECYSTECTOMY  11-19-2006  dr Zella Richer  [facility]   LEFT HEART CATH AND CORONARY ANGIOGRAPHY N/A 01/18/2019   Procedure: LEFT HEART CATH AND CORONARY ANGIOGRAPHY;  Surgeon: Adrian Prows, MD;  Location: [place] [facility];  Service: Cardiovascular;  Laterality: N/A;   LEFT HEART CATHETERIZATION WITH CORONARY ANGIOGRAM N/A 11/29/2013   Procedure: LEFT HEART CATHETERIZATION WITH CORONARY ANGIOGRAM;  Surgeon: Laverda Page, MD;  Location: [facility] C[facility] LAB;  Service: Cardiovascular;  Laterality: N/A;   TOTAL ABDOMINAL HYSTERECTOMY W/ BILATERAL SALPINGOOPHORECTOMY  06-20-2003    dr  lTamala Julian  [facility]   endometrial cancer   TOTAL HIP ARTHROPLASTY Right 09/02/2013   Procedure: RIGHT TOTAL HIP ARTHROPLASTY ANTERIOR APPROACH;  Surgeon: Alta Corning, MD;  Location: [place];  Service: Orthopedics;  Laterality: Right;   TOTAL KNEE ARTHROPLASTY Left 08/02/2012   Procedure: TOTAL KNEE ARTHROPLASTY;  Surgeon: Alta Corning, MD;  Location: [place];  Service: Orthopedics;  Laterality: Left;  left total knee arthroplasty   TUBAL LIGATION  yrs ago    FAMILY HISTORY: The patient's family history includes Cancer in her sister; Diabetes in her brother, father, mother, and sister; Diabetes (age of onset: 46) in her sister; Diabetes type II in her father; Heart disease (age of onset: 31) in her brother and father; Heart disease (age of onset: 32) in her sister; High Cholesterol in her father and mother; Hypertension in her brother, father, mother, and sister; Lung cancer (age of onset: 48) in her father; Lupus in an other family member; Obesity in her mother; Pancreatic cancer in an other family member; Sudden death in her mother; Thyroid disease in her mother.   SOCIAL HISTORY:  The patient  reports that she has never smoked. She has never used smokeless tobacco. She reports  that she does not drink alcohol and does not use drugs.  Review of Systems  Constitutional: Negative for chills, fever and malaise/fatigue.  HENT:  Negative for hoarse voice and nosebleeds.   Eyes:  Negative for discharge, double vision and pain.  Cardiovascular:  Positive for dyspnea on exertion. Negative for chest pain, claudication, leg swelling, near-syncope, orthopnea, palpitations, paroxysmal nocturnal dyspnea and syncope.  Respiratory:  Negative for hemoptysis and shortness of breath.   Musculoskeletal:  Negative for muscle cramps and myalgias.  Gastrointestinal:  Negative for abdominal pain, constipation, diarrhea, hematemesis, hematochezia, melena, nausea and vomiting.  Neurological:  Negative for dizziness and light-headedness.   PHYSICAL EXAM: Vitals with BMI 11/22/2020 09/11/2020 07/03/2020  Height $Remov'5\' 4"'VFqCUG$  $Remove'5\' 4"'hbcgFFH$  $RemoveB'5\' 4"'UqkucZPj$   Weight 247 lbs 10 oz 246 lbs 255 lbs 6 oz  BMI 42.48 35.70 17.79  Systolic 390 300 923  Diastolic 61 87 81  Pulse 68 72 72    CONSTITUTIONAL: Well-developed and well-nourished. No acute distress.  SKIN: Skin is warm and dry. No rash noted. No cyanosis. No pallor. No jaundice HEAD: Normocephalic and atraumatic.  EYES: No scleral icterus MOUTH/THROAT: Moist oral membranes.  NECK: No JVD present. No thyromegaly noted. No carotid bruits  LYMPHATIC: No visible cervical adenopathy.  CHEST Normal respiratory effort. No intercostal retractions  LUNGS: Clear to auscultation bilaterally.  No stridor. No wheezes. No rales.  CARDIOVASCULAR: Regular rate and rhythm, positive S1-S2, no murmurs rubs or gallops appreciated. ABDOMINAL: Obese, soft, nontender, nondistended, positive bowel sounds in all 4 quadrants, no apparent ascites.  EXTREMITIES: Trace bilateral peripheral edema, warm to touch. HEMATOLOGIC: No significant bruising NEUROLOGIC: Oriented to person, place, and time. Nonfocal. Normal muscle tone.  PSYCHIATRIC: Normal mood and affect. Normal behavior.  Cooperative  CARDIAC DATABASE: EKG: 04/30/2020: Normal sinus rhythm, consider old anterior septal infarct, without underlying injury pattern.   06/04/2020: Normal sinus rhythm, 66 bpm, normal axis, without underlying injury pattern.  Echocardiogram: 06/13/2020:    Poor echo window. Normal LV systolic function with EF 70%. Left ventricle cavity is normal in size. Mild concentric remodeling of the left  ventricle. Normal global wall motion. Doppler evidence of grade I (impaired) diastolic dysfunction, normal LAP. Calculated EF 70%.  Left atrial cavity is mildly dilated at 3.9 cm.  Right ventricle cavity is slightly dilated. Normal right ventricular function.  No significant change from 11/16/2018.  Stress Testing:  Leane Call Stress Test 12/27/2018: 1.  Resting EKG normal sinus rhythm, stress EKG nondiagnostic due to pharmacologic stress. 2.  Left ventricle is dilated both in rest and stress images, LV end-diastolic volume 372 mL. Perfusion images reveal a small sized mild to moderate amount of ischemia in the apical inferior and periapical region.  LVEF elevated at 47%.  This is an intermediate risk study. No previous exam available for comparison.  Heart Catheterization: 01/18/2019: Normal LV systolic function, normal LVEDP.  No pressure gradient across the aortic valve. RCA is dominant and has mild disease.  Circumflex coronary artery is moderate-sized vessel with mild disease. LAD is a large-caliber vessel, with high-grade 95% proximal stenosis followed by tandem 70 to 80% stenosis.  Moderate amount of calcification evident. Successful orbital atherectomy followed by stenting with 3.5 x 30 mm resolute Onyx at 12 atmospheric pressure for 60 seconds followed by postdilatation with 3.5 x 18 mm [place] balloon at 16 atmospheric pressure x2.  Stenosis reduced to 0% with TIMI-3 to TIMI-3 flow.  Carotid duplex: [12/27/2013]: No evidence of hemodynamically significant stenosis in the bilateral  carotid bifurcation vessels.  LABORATORY DATA: CBC Latest Ref Rng & Units 01/14/2019 07/09/2018 07/07/2018  WBC 3.4 - 10.8 x10E3/uL 7.9 15.7(H) 17.1(H)  Hemoglobin 11.1 - 15.9 g/dL 13.1 13.3 12.9  Hematocrit 34.0 - 46.6 % 39.6 41.4 38.7  Platelets 150 - 450 x10E3/uL 282 334 318    CMP Latest Ref Rng & Units 10/24/2020 06/27/2020 05/15/2020  Glucose 65 - 99 mg/dL 141(H) 130(H) 146(H)  BUN 8 - 27 mg/dL $Remove'20 19 20  'fkxLdfF$ Creatinine 0.57 - 1.00 mg/dL 1.09(H) 0.99 0.84  Sodium 134 - 144 mmol/L 141 140 142  Potassium 3.5 - 5.2 mmol/L 4.9 4.9 4.6  Chloride 96 - 106 mmol/L 99 97 101  CO2 20 - 29 mmol/L $RemoveB'25 25 24  'UeHxfJwL$ Calcium 8.7 - 10.3 mg/dL 10.1 9.9 9.5  Total Protein 6.0 - 8.5 g/dL 6.9 - -  Total Bilirubin 0.0 - 1.2 mg/dL 0.3 - -  Alkaline Phos 44 - 121 IU/L 67 - -  AST 0 - 40 IU/L 18 - -  ALT 0 - 32 IU/L 26 - -    Lipid Panel  Lab Results  Component Value Date   CHOL 108 10/24/2020   HDL 40 10/24/2020   LDLCALC 24 10/24/2020   LDLDIRECT 19 10/24/2020   TRIG 300 (H) 10/24/2020   CHOLHDL 5.9 (H) 07/16/2015    Lab Results  Component Value Date   HGBA1C 7.4 (H) 10/27/2018   HGBA1C 6.7 (H) 02/01/2018   HGBA1C 6.7 (H) 10/06/2017   No components found for: NTPROBNP Lab Results  Component Value Date   TSH 1.960 03/21/2020   TSH 2.300 10/27/2018   TSH 3.010 04/29/2017    Cardiac Panel (last 3 results) No results for input(s): CKTOTAL, CKMB, TROPONINIHS, RELINDX in the last 72 hours.  IMPRESSION:    ICD-10-CM   1. Hypertriglyceridemia  E78.1 Lipid Panel With LDL/HDL Ratio    LDL cholesterol, direct    CMP14+EGFR    2. Coronary artery disease involving native coronary artery of native heart without angina pectoris  I25.10     3. Status post angioplasty with stent  Z95.820     4. Type 2 diabetes mellitus without complication, without long-term current use of insulin (HCC)  E11.9     5. Essential hypertension  I10  6. Mixed hyperlipidemia  E78.2     7. History of COVID-19  Z86.16      8. Hypomagnesemia  E83.42        RECOMMENDATIONS: [name] [name] is a 70 y.o. female whose past medical history and cardiovascular risk factors include: Diabetes mellitus type 2, morbid obesity, obstructive sleep apnea on CPAP, mixed hyperlipidemia, chronic palpitations with history of PACs and PVCs, CAD status post PCI to the LAD 01/18/2019, postmenopausal female, advanced age.  Hypertriglyceridemia Most recent labs 10/24/2020 independently reviewed. Patient's fasting triglyceride level still are above the recommended goal. Currently on Vascepa. We discussed initiation of fenofibrate today; however, patient would like to implement additional lifestyle changes and even consider seeing a dietitian.  Patient will speak to her PCP regarding this. Will recheck fasting lipid profile in 3 months.  Coronary artery disease involving native coronary artery of native heart without angina pectoris status post PCI Denies anginal discomfort. Medications reconciled. Most recent ischemic evaluation reviewed as a part of today's encounter.  Type 2 diabetes mellitus without complication, without long-term current use of insulin (HCC) Not aware of the most recent hemoglobin A1c levels. Currently on ARB. Currently on statin therapy as well as PCSK9 inhibitors. Educated on the importance of glycemic control and improving her other modifiable cardiovascular risk factors  Essential hypertension Office blood pressures are within acceptable range. Reviewed the most recent blood pressure log as part of principal care management during today's encounter. Medications reconciled Low-salt diet recommended Continue current medical therapy.  Mixed hyperlipidemia Currently on maximally tolerated statin therapy. Currently on PCSK9 inhibitor. Most recent lipid profile from August 2022 independently reviewed. LDL 19 mg/dL.  Hypomagnesemia Currently on magnesium supplement.   FINAL MEDICATION LIST END  OF ENCOUNTER: No orders of the defined types were placed in this encounter.    Current Outpatient Medications:    ACCU-CHEK AVIVA PLUS test strip, U UTD, Disp: , Rfl: 5   ACCU-CHEK SOFTCLIX LANCETS lancets, U UTD, Disp: , Rfl: 11   acetaminophen (TYLENOL) 500 MG tablet, Take 1,000-1,500 mg by mouth 2 (two) times daily as needed (pain)., Disp: , Rfl:    Alirocumab (PRALUENT) 150 MG/ML SOAJ, Inject 1 mL into the skin every 14 (fourteen) days., Disp: 2 mL, Rfl: 6   atorvastatin (LIPITOR) 80 MG tablet, Take 80 mg by mouth every evening. , Disp: , Rfl: 5   carvedilol (COREG) 12.5 MG tablet, TAKE 1 TABLET(12.5 MG) BY MOUTH TWICE DAILY, Disp: 180 tablet, Rfl: 0   cetirizine (ZYRTEC) 10 MG tablet, Take 10 mg by mouth at bedtime. , Disp: , Rfl:    chlorthalidone (HYGROTON) 25 MG tablet, Take 1 tablet (25 mg total) by mouth in the morning for 180 doses., Disp: 90 tablet, Rfl: 1   Cholecalciferol (VITAMIN D) 50 MCG (2000 UT) tablet, Take 2,000 Units by mouth daily., Disp: , Rfl:    clopidogrel (PLAVIX) 75 MG tablet, Take 75 mg by mouth daily., Disp: , Rfl:    COVID-19 mRNA Vac-TriS, Pfizer, SUSP injection, Inject into the muscle., Disp: 0.3 mL, Rfl: 0   eplerenone (INSPRA) 25 MG tablet, TAKE 1 TABLET(25 MG) BY MOUTH EVERY MORNING, Disp: 90 tablet, Rfl: 0   escitalopram (LEXAPRO) 20 MG tablet, Take 20 mg by mouth every evening. , Disp: , Rfl:    glimepiride (AMARYL) 4 MG tablet, Take 4 mg by mouth daily with breakfast. , Disp: , Rfl:    indomethacin (INDOCIN) 25 MG capsule, Take 1 capsule (25 mg total) by mouth 3 (  three) times daily as needed., Disp: 20 capsule, Rfl: 0   losartan (COZAAR) 100 MG tablet, Take 1 tablet (100 mg total) by mouth daily., Disp: 90 tablet, Rfl: 1   MAGnesium-Oxide 400 (240 Mg) MG tablet, Take 2 tablets (800 mg total) by mouth 3 (three) times daily., Disp: 360 tablet, Rfl: 0   meloxicam (MOBIC) 15 MG tablet, Take 15 mg by mouth daily., Disp: , Rfl:    metFORMIN (GLUCOPHAGE) 1000  MG tablet, Take 1 tablet (1,000 mg total) by mouth 2 (two) times daily with a meal., Disp: 60 tablet, Rfl: 0   Misc Natural Products (LUTEIN 20 PO), Take 20 mg by mouth daily., Disp: , Rfl:    Multiple Vitamin (MULTIVITAMIN WITH MINERALS) TABS tablet, Take 1 tablet by mouth daily., Disp: , Rfl:    nitroGLYCERIN (NITROSTAT) 0.4 MG SL tablet, PLACE 1 TABLET UNDER THE TONGUE EVERY 5 MINUTES AS NEEDED FOR CHEST PAIN, Disp: 25 tablet, Rfl: 3   pantoprazole (PROTONIX) 40 MG tablet, Take 1 tablet by mouth daily., Disp: , Rfl:    TRULICITY 3 OE/7.0JJ SOPN, Inject 3 mg into the skin every Friday. , Disp: , Rfl:    VASCEPA 1 g capsule, TAKE 2 CAPSULES(2 GRAMS) BY MOUTH TWICE DAILY, Disp: 120 capsule, Rfl: 5  Orders Placed This Encounter  Procedures   Lipid Panel With LDL/HDL Ratio   LDL cholesterol, direct   CMP14+EGFR    --Continue cardiac medications as reconciled in final medication list. --Return in about 3 months (around 02/21/2021) for Follow up TAG and labs prior to visit . Or sooner if needed. --Continue follow-up with your primary care physician regarding the management of your other chronic comorbid conditions.  Patient's questions and concerns were addressed to her satisfaction. She voices understanding of the instructions provided during this encounter.   This note was created using a voice recognition software as a result there may be grammatical errors inadvertently enclosed that do not reflect the nature of this encounter. Every attempt is made to correct such errors.  Rex Kras, [place], [facility]  Pager: [phone] Office: [phone]

## 2020-12-02 ENCOUNTER — Other Ambulatory Visit: Payer: Self-pay | Admitting: Cardiology

## 2020-12-02 DIAGNOSIS — I2510 Atherosclerotic heart disease of native coronary artery without angina pectoris: Secondary | ICD-10-CM

## 2020-12-02 DIAGNOSIS — Z95820 Peripheral vascular angioplasty status with implants and grafts: Secondary | ICD-10-CM

## 2020-12-12 ENCOUNTER — Other Ambulatory Visit: Payer: Self-pay

## 2020-12-12 DIAGNOSIS — E781 Pure hyperglyceridemia: Secondary | ICD-10-CM

## 2020-12-13 ENCOUNTER — Emergency Department (HOSPITAL_BASED_OUTPATIENT_CLINIC_OR_DEPARTMENT_OTHER)
Admission: EM | Admit: 2020-12-13 | Discharge: 2020-12-13 | Disposition: A | Discharge status: Home / Self Care | Payer: Medicare Other | Attending: Emergency Medicine | Admitting: Emergency Medicine

## 2020-12-13 ENCOUNTER — Encounter (HOSPITAL_BASED_OUTPATIENT_CLINIC_OR_DEPARTMENT_OTHER): Payer: Self-pay | Admitting: Emergency Medicine

## 2020-12-13 ENCOUNTER — Other Ambulatory Visit: Payer: Self-pay

## 2020-12-13 DIAGNOSIS — L03031 Cellulitis of right toe: Secondary | ICD-10-CM | POA: Insufficient documentation

## 2020-12-13 DIAGNOSIS — E119 Type 2 diabetes mellitus without complications: Secondary | ICD-10-CM | POA: Insufficient documentation

## 2020-12-13 DIAGNOSIS — M79671 Pain in right foot: Secondary | ICD-10-CM | POA: Diagnosis present

## 2020-12-13 DIAGNOSIS — I2510 Atherosclerotic heart disease of native coronary artery without angina pectoris: Secondary | ICD-10-CM | POA: Diagnosis not present

## 2020-12-13 DIAGNOSIS — I119 Hypertensive heart disease without heart failure: Secondary | ICD-10-CM | POA: Insufficient documentation

## 2020-12-13 DIAGNOSIS — Z96641 Presence of right artificial hip joint: Secondary | ICD-10-CM | POA: Insufficient documentation

## 2020-12-13 DIAGNOSIS — Z7984 Long term (current) use of oral hypoglycemic drugs: Secondary | ICD-10-CM | POA: Insufficient documentation

## 2020-12-13 DIAGNOSIS — Z8542 Personal history of malignant neoplasm of other parts of uterus: Secondary | ICD-10-CM | POA: Insufficient documentation

## 2020-12-13 DIAGNOSIS — Z79899 Other long term (current) drug therapy: Secondary | ICD-10-CM | POA: Insufficient documentation

## 2020-12-13 DIAGNOSIS — Z96652 Presence of left artificial knee joint: Secondary | ICD-10-CM | POA: Diagnosis not present

## 2020-12-13 MED ORDER — DOXYCYCLINE HYCLATE 100 MG PO CAPS: 100.0000 mg | ORAL_CAPSULE | Freq: Two times a day (BID) | ORAL | 0 refills | Status: DC

## 2020-12-13 NOTE — ED Triage Notes (Signed)
Reports right foot pain and swelling since Sunday.  Toes noted to be red and reported to be tender.  Hx of diabetes.

## 2020-12-13 NOTE — ED Provider Notes (Signed)
[facility] EMERGENCY DEPT Provider Note   CSN: [id number] Arrival date & time: 12/13/20  1619     History Chief Complaint  Patient presents with   Foot Pain    [name] [name] is a 70 y.o. female.   Foot Pain Pertinent negatives include no chest pain, no abdominal pain and no shortness of breath.   70 year old female with a history of DM2, HTN, HLD presenting to the emergency department with right foot pain and swelling since this past Sunday.  She states that her right second digit has had some redness.  She endorses swelling of her right foot.  She is concerned for infection and is concerned because she is a diabetic.  She denies any fevers or chills.  She denies any difficulty with range of motion of her ankle.  She has had some difficulty ambulating due to the discomfort.  Past Medical History:  Diagnosis Date   Anxiety    Carpal tunnel syndrome of right wrist    Coronary artery disease    per cath 11-29-2013  mild diffuse CAD and calcification in proximal LAD   Depression    Diverticulosis    Essential hypertension    GERD (gastroesophageal reflux disease)    Heart disease    History of endometrial cancer 05/2003   FIGO, Grade 1  s/p  TAG w/ BSO   HTN (hypertension)    Hyperlipidemia    OA (osteoarthritis)    OSA on CPAP    followed by dr Elsworth Soho-- per study 03-26-2012  mod. to severe OSA , AHI 37/hr   Type II diabetes mellitus ([place])    followed by pcp   Urinary incontinence in female    Vitamin D deficiency     Patient Active Problem List   Diagnosis Date Noted   Depression with anxiety 05/09/2020   Mild neurocognitive disorder 05/09/2020   Coronary artery disease 01/18/2019   Class 3 severe obesity with serious comorbidity and body mass index (BMI) of 40.0 to 44.9 in adult ([place]) 10/13/2018   DM (diabetes mellitus), type 2 ([place]) 06/30/2018   Essential hypertension 06/30/2018   Right carpal tunnel syndrome 07/17/2017   Other fatigue 04/29/2017    Shortness of breath on exertion 04/29/2017   Vitamin D deficiency 04/29/2017   Hypersomnolence 70/96/2836   Nonalcoholic fatty liver disease without nonalcoholic steatohepatitis ([facility]) 02/12/2016   Gastroesophageal reflux disease with esophagitis 11/23/2014   History of endometrial cancer 08/25/2014   Mixed hyperlipidemia 08/25/2014   Morbid obesity with alveolar hypoventilation ([place]) 08/25/2014   Bilateral carotid bruits 11/30/2013   Dyspnea on exertion 11/30/2013   Angina pectoris ([place]) 11/29/2013   Osteoarthritis of right hip 09/02/2013   Cystocele 07/03/2013   Vaginal pessary present 04/25/2013   Prolapse of vaginal vault after hysterectomy 04/25/2013   Endometrial cancer, grade I ([place]) 04/25/2013   Unspecified vitamin D deficiency 04/25/2013   Personal history of other medical treatment 04/25/2013   Osteoarthritis of left knee 08/02/2012   Stress incontinence 06/07/2012   Essential hypertension with goal blood pressure less than 130/85 06/07/2012   Diabetes ([place]) 06/07/2012   Obstructive sleep apnea 02/16/2012    Past Surgical History:  Procedure Laterality Date   CARDIAC CATHETERIZATION  11/29/2013   dr Einar Gip   mild diffuse noncritial CAD and mild calcification involving the proximal LAD,  LVEF 55-60%   CARPAL TUNNEL RELEASE Right 07/17/2017   Procedure: CARPAL TUNNEL RELEASE;  Surgeon: Dorna Leitz, MD;  Location: [facility];  Service: Orthopedics;  Laterality: Right;   CHOLECYSTECTOMY     COLONOSCOPY WITH PROPOFOL N/A 01/31/2020   Procedure: COLONOSCOPY WITH PROPOFOL;  Surgeon: Juanita Craver, MD;  Location: [facility];  Service: Endoscopy;  Laterality: N/A;   CORONARY ATHERECTOMY N/A 01/18/2019   Procedure: CORONARY ATHERECTOMY;  Surgeon: Adrian Prows, MD;  Location: [place];  Service: Cardiovascular;  Laterality: N/A;   CORONARY STENT INTERVENTION N/A 01/18/2019   Procedure: CORONARY STENT INTERVENTION;  Surgeon: Adrian Prows, MD;  Location: [place] [facility];  Service: Cardiovascular;  Laterality: N/A;   KNEE ARTHROSCOPY Left 10/ 2004;  05-17-2008   dr graves   KNEE ARTHROSCOPY Right 11/2012   LAPAROSCOPIC CHOLECYSTECTOMY  11-19-2006  dr Zella Richer  [facility]   LEFT HEART CATH AND CORONARY ANGIOGRAPHY N/A 01/18/2019   Procedure: LEFT HEART CATH AND CORONARY ANGIOGRAPHY;  Surgeon: Adrian Prows, MD;  Location: [place] CV LAB;  Service: Cardiovascular;  Laterality: N/A;   LEFT HEART CATHETERIZATION WITH CORONARY ANGIOGRAM N/A 11/29/2013   Procedure: LEFT HEART CATHETERIZATION WITH CORONARY ANGIOGRAM;  Surgeon: Laverda Page, MD;  Location: [facility] CATH LAB;  Service: Cardiovascular;  Laterality: N/A;   TOTAL ABDOMINAL HYSTERECTOMY W/ BILATERAL SALPINGOOPHORECTOMY  06-20-2003    dr Mohammed Kindle  [facility]   endometrial cancer   TOTAL HIP ARTHROPLASTY Right 09/02/2013   Procedure: RIGHT TOTAL HIP ARTHROPLASTY ANTERIOR APPROACH;  Surgeon: Alta Corning, MD;  Location: [place];  Service: Orthopedics;  Laterality: Right;   TOTAL KNEE ARTHROPLASTY Left 08/02/2012   Procedure: TOTAL KNEE ARTHROPLASTY;  Surgeon: Alta Corning, MD;  Location: [place];  Service: Orthopedics;  Laterality: Left;  left total knee arthroplasty   TUBAL LIGATION  yrs ago     OB History     Gravida  3   Para  3   Term  3   Preterm  0   AB  0   Living  3      SAB  0   IAB  0   Ectopic  0   Multiple  0   Live Births  3           Family History  Problem Relation Age of Onset   Heart disease Father 86       CABG   Lung cancer Father 41       lung   Hypertension Father    Diabetes Father    High Cholesterol Father    Diabetes type II Father    Heart disease Sister 79       CABG   Diabetes Sister 65   Heart disease Brother 107       MI with stents   Hypertension Mother    Diabetes Mother    High Cholesterol Mother    Sudden death Mother    Thyroid disease Mother    Obesity Mother    Hypertension Sister        x2    Diabetes Sister    Hypertension  Brother    Diabetes Brother    Cancer Sister        high grade Neuroendocrine carcinoma right axilla   Pancreatic cancer Other        x 3 - 2 MAunts and 1 P Aunts.    Lupus Other        Pat Aunt   Dementia Neg Hx    Alzheimer's disease Neg Hx     Social History   Tobacco Use   Smoking status: Never  Smokeless tobacco: Never  Vaping Use   Vaping Use: Never used  Substance Use Topics   Alcohol use: Never   Drug use: Never    Home Medications Prior to Admission medications   Medication Sig Start Date End Date Taking? Authorizing Provider  doxycycline (VIBRAMYCIN) 100 MG capsule Take 1 capsule (100 mg total) by mouth 2 (two) times daily. 12/13/20  Yes Regan Lemming, MD  ACCU-CHEK AVIVA PLUS test strip U UTD 06/12/17   [provider]  ACCU-CHEK SOFTCLIX LANCETS lancets U UTD 06/12/17   [provider]  acetaminophen (TYLENOL) 500 MG tablet Take 1,000-1,500 mg by mouth 2 (two) times daily as needed (pain).    [provider]  Alirocumab (PRALUENT) 150 MG/ML SOAJ Inject 1 mL into the skin every 14 (fourteen) days. 06/14/20   Tolia, Sunit, DO  atorvastatin (LIPITOR) 80 MG tablet Take 80 mg by mouth every evening.  04/11/15   [provider]  carvedilol (COREG) 12.5 MG tablet TAKE 1 TABLET(12.5 MG) BY MOUTH TWICE DAILY 11/07/20   Tolia, Sunit, DO  cetirizine (ZYRTEC) 10 MG tablet Take 10 mg by mouth at bedtime.     [provider]  chlorthalidone (HYGROTON) 25 MG tablet Take 1 tablet (25 mg total) by mouth in the morning for 180 doses. 11/07/19 11/22/20  Tolia, Sunit, DO  Cholecalciferol (VITAMIN D) 50 MCG (2000 UT) tablet Take 2,000 Units by mouth daily.    [provider]  clopidogrel (PLAVIX) 75 MG tablet Take 75 mg by mouth daily.    [provider]  COVID-19 mRNA Vac-TriS, Pfizer, SUSP injection Inject into the muscle. 10/05/20   Carlyle Basques, MD  eplerenone (INSPRA) 25 MG tablet TAKE 1 TABLET(25 MG) BY MOUTH EVERY  MORNING 12/03/20   Tolia, Sunit, DO  escitalopram (LEXAPRO) 20 MG tablet Take 20 mg by mouth every evening.     [provider]  glimepiride (AMARYL) 4 MG tablet Take 4 mg by mouth daily with breakfast.  02/14/18   [provider]  indomethacin (INDOCIN) 25 MG capsule Take 1 capsule (25 mg total) by mouth 3 (three) times daily as needed. 09/11/20   Veryl Speak, MD  losartan (COZAAR) 100 MG tablet Take 1 tablet (100 mg total) by mouth daily. 11/07/19 11/22/20  Tolia, Sunit, DO  MAGnesium-Oxide 400 (240 Mg) MG tablet Take 2 tablets (800 mg total) by mouth 3 (three) times daily. 10/26/20   Tolia, Sunit, DO  meloxicam (MOBIC) 15 MG tablet Take 15 mg by mouth daily.    [provider]  metFORMIN (GLUCOPHAGE) 1000 MG tablet Take 1 tablet (1,000 mg total) by mouth 2 (two) times daily with a meal. 01/20/19   Adrian Prows, MD  Misc Natural Products (LUTEIN 20 PO) Take 20 mg by mouth daily.    [provider]  Multiple Vitamin (MULTIVITAMIN WITH MINERALS) TABS tablet Take 1 tablet by mouth daily.    [provider]  nitroGLYCERIN (NITROSTAT) 0.4 MG SL tablet PLACE 1 TABLET UNDER THE TONGUE EVERY 5 MINUTES AS NEEDED FOR CHEST PAIN 10/05/20   Tolia, Sunit, DO  pantoprazole (PROTONIX) 40 MG tablet Take 1 tablet by mouth daily. 05/05/20   [provider]  TRULICITY 3 GB/1.5VV SOPN Inject 3 mg into the skin every Friday.  01/06/20   [provider]  VASCEPA 1 g capsule TAKE 2 CAPSULES(2 GRAMS) BY MOUTH TWICE DAILY 12/03/20   Tolia, Sunit, DO    Allergies    Codeine  Review of Systems  Review of Systems  Constitutional:  Negative for chills and fever.  HENT:  Negative for ear pain and sore throat.   Eyes:  Negative for pain and visual disturbance.  Respiratory:  Negative for cough and shortness of breath.   Cardiovascular:  Negative for chest pain and palpitations.  Gastrointestinal:  Negative for abdominal pain and vomiting.  Genitourinary:   Negative for dysuria and hematuria.  Musculoskeletal:  Negative for arthralgias and back pain.  Skin:  Positive for rash. Negative for color change.  Neurological:  Negative for seizures and syncope.  All other systems reviewed and are negative.  Physical Exam Updated Vital Signs BP (!) 154/67 (BP Location: Right Arm)   Pulse 77   Temp 99.6 F (37.6 C) (Oral)   Resp 18   Ht 5\' 4"  (1.626 m)   Wt 111.1 kg   LMP 11/25/2002 (Approximate)   SpO2 99%   BMI 42.05 kg/m   Physical Exam Vitals and nursing note reviewed.  Constitutional:      General: She is not in acute distress.    Appearance: She is well-developed.  HENT:     Head: Normocephalic and atraumatic.  Eyes:     Conjunctiva/sclera: Conjunctivae normal.     Pupils: Pupils are equal, round, and reactive to light.  Cardiovascular:     Rate and Rhythm: Normal rate and regular rhythm.     Heart sounds: No murmur heard. Pulmonary:     Effort: Pulmonary effort is normal. No respiratory distress.     Breath sounds: Normal breath sounds.  Abdominal:     General: There is no distension.     Palpations: Abdomen is soft.     Tenderness: There is no abdominal tenderness. There is no guarding.  Musculoskeletal:        General: No deformity or signs of injury.     Cervical back: Normal range of motion and neck supple.     Comments: Right foot with swelling, erythema noted to the right great toe with no fluctuance noted.  DP pulses 2+ with intact sensation to light touch  Skin:    General: Skin is warm and dry.     Findings: No lesion or rash.  Neurological:     General: No focal deficit present.     Mental Status: She is alert. Mental status is at baseline.    ED Results / Procedures / Treatments   Labs (all labs ordered are listed, but only abnormal results are displayed) Labs Reviewed - No data to display  EKG None  Radiology No results found.  Procedures Procedures   Medications Ordered in ED Medications - No  data to display  ED Course  I have reviewed the triage vital signs and the nursing notes.  Pertinent labs & imaging results that were available during my care of the patient were reviewed by me and considered in my medical decision making (see chart for details).    MDM Rules/Calculators/A&P                            70 year old female with a history of DM2, HTN, HLD presenting to the emergency department with right foot pain and swelling since this past Sunday.  She states that her right second digit has had some redness.  She endorses swelling of her right foot.  She is concerned for infection and is concerned because she is a diabetic.  She denies any fevers or chills.  She denies any difficulty with range of motion of her ankle.  She has had some difficulty ambulating due to the discomfort.  On exam, the patient has cellulitis to the right second toe with some associated swelling in her right foot.  She has been on Augmentin for presumed bacterial sinusitis.  We will cover her for staph aureus infection with doxycycline 100 mg twice daily.  She was advised to follow-up with her PCP or return to the emergency department in the event of worsening symptoms despite outpatient antibiotic management.  Final Clinical Impression(s) / ED Diagnoses Final diagnoses:  Cellulitis of toe of right foot    Rx / DC Orders ED Discharge Orders          Ordered    doxycycline (VIBRAMYCIN) 100 MG capsule  2 times daily        12/13/20 1732             Regan Lemming, MD 12/13/20 1735

## 2021-01-03 ENCOUNTER — Ambulatory Visit: Payer: Medicare Other | Admitting: Cardiology

## 2021-02-15 ENCOUNTER — Other Ambulatory Visit: Payer: Self-pay | Admitting: Cardiology

## 2021-02-15 DIAGNOSIS — I10 Essential (primary) hypertension: Secondary | ICD-10-CM

## 2021-02-21 ENCOUNTER — Ambulatory Visit: Payer: Medicare Other | Admitting: Cardiology

## 2021-03-09 ENCOUNTER — Other Ambulatory Visit: Payer: Self-pay | Admitting: Cardiology

## 2021-03-09 DIAGNOSIS — I2510 Atherosclerotic heart disease of native coronary artery without angina pectoris: Secondary | ICD-10-CM

## 2021-03-18 ENCOUNTER — Encounter: Payer: Self-pay | Admitting: Cardiology

## 2021-03-18 ENCOUNTER — Ambulatory Visit: Payer: Medicare Other | Admitting: Cardiology

## 2021-03-18 ENCOUNTER — Other Ambulatory Visit: Payer: Self-pay

## 2021-03-18 VITALS — BP 144/82 | HR 70 | Temp 97.9°F | Resp 17 | Ht 64.0 in | Wt 248.4 lb

## 2021-03-18 DIAGNOSIS — Z8616 Personal history of COVID-19: Secondary | ICD-10-CM

## 2021-03-18 DIAGNOSIS — E781 Pure hyperglyceridemia: Secondary | ICD-10-CM

## 2021-03-18 DIAGNOSIS — E782 Mixed hyperlipidemia: Secondary | ICD-10-CM

## 2021-03-18 DIAGNOSIS — E119 Type 2 diabetes mellitus without complications: Secondary | ICD-10-CM

## 2021-03-18 DIAGNOSIS — Z95820 Peripheral vascular angioplasty status with implants and grafts: Secondary | ICD-10-CM

## 2021-03-18 DIAGNOSIS — I2510 Atherosclerotic heart disease of native coronary artery without angina pectoris: Secondary | ICD-10-CM

## 2021-03-18 DIAGNOSIS — I10 Essential (primary) hypertension: Secondary | ICD-10-CM

## 2021-03-18 NOTE — Progress Notes (Signed)
[name] [name] Date of Birth: February 16, 1951 MRN: [id number] [name], [name] Kelp, [place] Former Cardiology Providers: Jeri Lager, APRN, FNP-C Primary Cardiologist: Rex Kras, DO, [facility] (established care 11/03/2019)  Date: 03/18/21 Last Office Visit: 11/22/2020  Chief Complaint  Patient presents with   Follow-up    3 MONTH   Hyperlipidemia    HPI  [name] [name] is a 71 y.o.  female who presents to the office with a chief complaint of " 3 month follow up hyperlipidemia." Patient's past medical history and cardiovascular risk factors include: Diabetes mellitus type 2, morbid obesity, obstructive sleep apnea on CPAP, mixed hyperlipidemia, hypertriglyceridemia, chronic palpitations with history of PACs and PVCs, CAD status post PCI to the LAD 01/18/2019, postmenopausal female, advanced age.  Patient is being followed by the practice due to her underlying coronary disease status post PCI to the LAD and managing her modifiable cardiovascular risk factors.  Since last office visit patient states that she is doing well from a cardiovascular standpoint.  She denies angina pectoris or heart failure symptoms.  Currently enrolled into principal care management and her average blood pressure is 136/69 mmHg with a heart rate of 71 bpm.  She is tolerating her medications well without any side effects or intolerances.  In August 2022 patient was noted to have triglyceride levels of 300 mg/dL and was started on Vascepa.  Repeat blood work in October 2022 notes triglyceride level of 219 mg/dL.  Patient is tolerating Vascepa well without any side effects or intolerances.  No significant change in her weight and no change in physical activity level.  No hospitalizations or urgent care visits for cardiovascular symptoms since last office encounter.  FUNCTIONAL STATUS: No structured exercise program or daily routine.   ALLERGIES: Allergies  Allergen Reactions   Codeine Palpitations    MEDICATION LIST PRIOR TO VISIT: Current Outpatient Medications on File Prior to Visit  Medication Sig Dispense Refill   ACCU-CHEK AVIVA PLUS test strip U UTD  5   ACCU-CHEK SOFTCLIX LANCETS lancets U UTD  11   acetaminophen (TYLENOL) 500 MG tablet Take 1,000-1,500 mg by mouth 2 (two) times daily as needed (pain).     Alirocumab (PRALUENT) 150 MG/ML SOAJ Inject 1 mL into the skin every 14 (fourteen) days. 2 mL 6   atorvastatin (LIPITOR) 80 MG tablet Take 80 mg by mouth every evening.   5   carvedilol (COREG) 12.5 MG tablet TAKE 1 TABLET(12.5 MG) BY MOUTH TWICE DAILY 180 tablet 0   cetirizine (ZYRTEC) 10 MG tablet Take 10 mg by mouth at bedtime.      chlorthalidone (HYGROTON) 25 MG tablet Take 1 tablet (25 mg total) by mouth in the morning for 180 doses. 90 tablet 1   Cholecalciferol (VITAMIN D) 50 MCG (2000 UT) tablet Take 2,000 Units by mouth daily.     clopidogrel (PLAVIX) 75 MG tablet Take 75 mg by mouth daily.     COVID-19 mRNA Vac-TriS, Pfizer, SUSP injection Inject into the muscle. 0.3 mL 0   empagliflozin (JARDIANCE) 10 MG TABS tablet Take 10 mg by mouth daily.     eplerenone (INSPRA) 25 MG tablet TAKE 1 TABLET(25 MG) BY MOUTH EVERY MORNING 90 tablet 0   escitalopram (LEXAPRO) 20 MG tablet Take 20 mg by mouth every evening.      fluticasone (FLONASE) 50 MCG/ACT nasal spray Place 1 spray into both nostrils as needed.     glimepiride (AMARYL) 4 MG tablet Take 4 mg by mouth daily with breakfast.  losartan (COZAAR) 100 MG tablet Take 1 tablet (100 mg total) by mouth daily. 90 tablet 1   MAGnesium-Oxide 400 (240 Mg) MG tablet Take 2 tablets (800 mg total) by mouth 3 (three) times daily. 360 tablet 0   meloxicam (MOBIC) 15 MG tablet Take 15 mg by mouth daily.     metFORMIN (GLUCOPHAGE) 1000 MG tablet Take 1 tablet (1,000 mg total) by mouth 2 (two) times daily with a meal. 60 tablet 0   Multiple Vitamin (MULTIVITAMIN WITH MINERALS) TABS tablet Take 1 tablet by mouth daily.      nitroGLYCERIN (NITROSTAT) 0.4 MG SL tablet PLACE 1 TABLET UNDER THE TONGUE EVERY 5 MINUTES AS NEEDED FOR CHEST PAIN 25 tablet 3   ondansetron (ZOFRAN) 4 MG tablet Take 1 tablet by mouth daily as needed.     pantoprazole (PROTONIX) 40 MG tablet Take 1 tablet by mouth daily.     TRULICITY 3 VO/1.6WV SOPN Inject 3 mg into the skin every Friday.      VASCEPA 1 g capsule TAKE 2 CAPSULES(2 GRAMS) BY MOUTH TWICE DAILY 120 capsule 5   No current facility-administered medications on file prior to visit.    PAST MEDICAL HISTORY: Past Medical History:  Diagnosis Date   Anxiety    Carpal tunnel syndrome of right wrist    Coronary artery disease    per cath 11-29-2013  mild diffuse CAD and calcification in proximal LAD   Depression    Diverticulosis    Essential hypertension    GERD (gastroesophageal reflux disease)    Heart disease    History of endometrial cancer 05/2003   FIGO, Grade 1  s/p  TAG w/ BSO   HTN (hypertension)    Hyperlipidemia    OA (osteoarthritis)    OSA on CPAP    followed by dr Elsworth Soho-- per study 03-26-2012  mod. to severe OSA , AHI 37/hr   Type II diabetes mellitus ([place])    followed by pcp   Urinary incontinence in female    Vitamin D deficiency     PAST SURGICAL HISTORY: Past Surgical History:  Procedure Laterality Date   CARDIAC CATHETERIZATION  11/29/2013   dr Einar Gip   mild diffuse noncritial CAD and mild calcification involving the proximal LAD,  LVEF 55-60%   CARPAL TUNNEL RELEASE Right 07/17/2017   Procedure: CARPAL TUNNEL RELEASE;  Surgeon: Dorna Leitz, MD;  Location: [facility];  Service: Orthopedics;  Laterality: Right;   CHOLECYSTECTOMY     COLONOSCOPY WITH PROPOFOL N/A 01/31/2020   Procedure: COLONOSCOPY WITH PROPOFOL;  Surgeon: Juanita Craver, MD;  Location: [facility];  Service: Endoscopy;  Laterality: N/A;   CORONARY ATHERECTOMY N/A 01/18/2019   Procedure: CORONARY ATHERECTOMY;  Surgeon: Adrian Prows, MD;  Location: [place] [facility];   Service: Cardiovascular;  Laterality: N/A;   CORONARY STENT INTERVENTION N/A 01/18/2019   Procedure: CORONARY STENT INTERVENTION;  Surgeon: Adrian Prows, MD;  Location: [place] [facility];  Service: Cardiovascular;  Laterality: N/A;   KNEE ARTHROSCOPY Left 10/ 2004;  05-17-2008   dr graves   KNEE ARTHROSCOPY Right 11/2012   LAPAROSCOPIC CHOLECYSTECTOMY  11-19-2006  dr Zella Richer  [facility]   LEFT HEART CATH AND CORONARY ANGIOGRAPHY N/A 01/18/2019   Procedure: LEFT HEART CATH AND CORONARY ANGIOGRAPHY;  Surgeon: Adrian Prows, MD;  Location: [place] [facility];  Service: Cardiovascular;  Laterality: N/A;   LEFT HEART CATHETERIZATION WITH CORONARY ANGIOGRAM N/A 11/29/2013   Procedure: LEFT HEART CATHETERIZATION WITH CORONARY ANGIOGRAM;  Surgeon: Cammy Brochure  Carlynn Herald, MD;  Location: [place];  Service: Cardiovascular;  Laterality: N/A;   TOTAL ABDOMINAL HYSTERECTOMY W/ BILATERAL SALPINGOOPHORECTOMY  06-20-2003    dr Mohammed Kindle  [facility]   endometrial cancer   TOTAL HIP ARTHROPLASTY Right 09/02/2013   Procedure: RIGHT TOTAL HIP ARTHROPLASTY ANTERIOR APPROACH;  Surgeon: Alta Corning, MD;  Location: [place];  Service: Orthopedics;  Laterality: Right;   TOTAL KNEE ARTHROPLASTY Left 08/02/2012   Procedure: TOTAL KNEE ARTHROPLASTY;  Surgeon: Alta Corning, MD;  Location: [place];  Service: Orthopedics;  Laterality: Left;  left total knee arthroplasty   TUBAL LIGATION  yrs ago    FAMILY HISTORY: The patient's family history includes Breast cancer in her sister; Cancer in her sister; Diabetes in her brother, father, mother, and sister; Diabetes (age of onset: 30) in her sister; Diabetes type II in her father; Heart disease (age of onset: 67) in her brother and father; Heart disease (age of onset: 105) in her sister; High Cholesterol in her father and mother; Hypertension in her brother, father, mother, and sister; Lung cancer (age of onset: 57) in her father; Lupus in an other family member; Obesity in her mother; Pancreatic cancer  in an other family member; Sudden death in her mother; Thyroid disease in her mother and sister.   SOCIAL HISTORY:  The patient  reports that she has never smoked. She has never used smokeless tobacco. She reports that she does not drink alcohol and does not use drugs.  Review of Systems  Constitutional: Negative for chills, fever and malaise/fatigue.  HENT:  Negative for hoarse voice and nosebleeds.   Eyes:  Negative for discharge, double vision and pain.  Cardiovascular:  Negative for chest pain, claudication, dyspnea on exertion, leg swelling, near-syncope, orthopnea, palpitations, paroxysmal nocturnal dyspnea and syncope.  Respiratory:  Negative for hemoptysis and shortness of breath.   Musculoskeletal:  Negative for muscle cramps and myalgias.  Gastrointestinal:  Negative for abdominal pain, constipation, diarrhea, hematemesis, hematochezia, melena, nausea and vomiting.  Neurological:  Negative for dizziness and light-headedness.   PHYSICAL EXAM: Vitals with BMI 03/18/2021 12/13/2020 12/13/2020  Height 5\' 4"  - 5\' 4"   Weight 248 lbs 6 oz - 245 lbs  BMI 02.40 - 97.35  Systolic 329 924 268  Diastolic 82 61 67  Pulse 70 67 77    CONSTITUTIONAL: Well-developed and well-nourished. No acute distress.  SKIN: Skin is warm and dry. No rash noted. No cyanosis. No pallor. No jaundice HEAD: Normocephalic and atraumatic.  EYES: No scleral icterus MOUTH/THROAT: Moist oral membranes.  NECK: No JVD present. No thyromegaly noted. No carotid bruits  LYMPHATIC: No visible cervical adenopathy.  CHEST Normal respiratory effort. No intercostal retractions  LUNGS: Clear to auscultation bilaterally.  No stridor. No wheezes. No rales.  CARDIOVASCULAR: Regular rate and rhythm, positive S1-S2, no murmurs rubs or gallops appreciated. ABDOMINAL: Obese, soft, nontender, nondistended, positive bowel sounds in all 4 quadrants, no apparent ascites.  EXTREMITIES: Trace bilateral peripheral edema, warm to  touch. HEMATOLOGIC: No significant bruising NEUROLOGIC: Oriented to person, place, and time. Nonfocal. Normal muscle tone.  PSYCHIATRIC: Normal mood and affect. Normal behavior. Cooperative  CARDIAC DATABASE: EKG: 04/30/2020: Normal sinus rhythm, consider old anterior septal infarct, without underlying injury pattern.   06/04/2020: Normal sinus rhythm, 66 bpm, normal axis, without underlying injury pattern.  Echocardiogram: 06/13/2020:    Poor echo window. Normal LV systolic function with EF 70%. Left ventricle cavity is normal in size. Mild concentric remodeling of the left  ventricle. Normal global wall motion. Doppler evidence of grade I (impaired) diastolic dysfunction, normal LAP. Calculated EF 70%.  Left atrial cavity is mildly dilated at 3.9 cm.  Right ventricle cavity is slightly dilated. Normal right ventricular function.  No significant change from 11/16/2018.  Stress Testing:  Leane Call Stress Test 12/27/2018: 1.  Resting EKG normal sinus rhythm, stress EKG nondiagnostic due to pharmacologic stress. 2.  Left ventricle is dilated both in rest and stress images, LV end-diastolic volume 382 mL. Perfusion images reveal a small sized mild to moderate amount of ischemia in the apical inferior and periapical region.  LVEF elevated at 47%.  This is an intermediate risk study. No previous exam available for comparison.  Heart Catheterization: 01/18/2019: Normal LV systolic function, normal LVEDP.  No pressure gradient across the aortic valve. RCA is dominant and has mild disease.  Circumflex coronary artery is moderate-sized vessel with mild disease. LAD is a large-caliber vessel, with high-grade 95% proximal stenosis followed by tandem 70 to 80% stenosis.  Moderate amount of calcification evident. Successful orbital atherectomy followed by stenting with 3.5 x 30 mm resolute Onyx at 12 atmospheric pressure for 60 seconds followed by postdilatation with 3.5 x 18 mm Clayton balloon at 16  atmospheric pressure x2.  Stenosis reduced to 0% with TIMI-3 to TIMI-3 flow.  Carotid duplex: [12/27/2013]: No evidence of hemodynamically significant stenosis in the bilateral carotid bifurcation vessels.  LABORATORY DATA: CBC Latest Ref Rng & Units 01/14/2019 07/09/2018 07/07/2018  WBC 3.4 - 10.8 x10E3/uL 7.9 15.7(H) 17.1(H)  Hemoglobin 11.1 - 15.9 g/dL 13.1 13.3 12.9  Hematocrit 34.0 - 46.6 % 39.6 41.4 38.7  Platelets 150 - 450 x10E3/uL 282 334 318    CMP Latest Ref Rng & Units 10/24/2020 06/27/2020 05/15/2020  Glucose 65 - 99 mg/dL 141(H) 130(H) 146(H)  BUN 8 - 27 mg/dL $Remove'20 19 20  'amtwJxN$ Creatinine 0.57 - 1.00 mg/dL 1.09(H) 0.99 0.84  Sodium 134 - 144 mmol/L 141 140 142  Potassium 3.5 - 5.2 mmol/L 4.9 4.9 4.6  Chloride 96 - 106 mmol/L 99 97 101  CO2 20 - 29 mmol/L $RemoveB'25 25 24  'JUAUfxMG$ Calcium 8.7 - 10.3 mg/dL 10.1 9.9 9.5  Total Protein 6.0 - 8.5 g/dL 6.9 - -  Total Bilirubin 0.0 - 1.2 mg/dL 0.3 - -  Alkaline Phos 44 - 121 IU/L 67 - -  AST 0 - 40 IU/L 18 - -  ALT 0 - 32 IU/L 26 - -    Lipid Panel  Lab Results  Component Value Date   CHOL 108 10/24/2020   HDL 40 10/24/2020   LDLCALC 24 10/24/2020   LDLDIRECT 19 10/24/2020   TRIG 300 (H) 10/24/2020   CHOLHDL 5.9 (H) 07/16/2015    Lab Results  Component Value Date   HGBA1C 7.4 (H) 10/27/2018   HGBA1C 6.7 (H) 02/01/2018   HGBA1C 6.7 (H) 10/06/2017   No components found for: NTPROBNP Lab Results  Component Value Date   TSH 1.960 03/21/2020   TSH 2.300 10/27/2018   TSH 3.010 04/29/2017    Cardiac Panel (last 3 results) No results for input(s): CKTOTAL, CKMB, TROPONINIHS, RELINDX in the last 72 hours.  IMPRESSION:    ICD-10-CM   1. Hypertriglyceridemia  E78.1 Lipid Panel With LDL/HDL Ratio    LDL cholesterol, direct    CMP14+EGFR    2. Coronary artery disease involving native coronary artery of native heart without angina pectoris  I25.10 EKG 12-Lead    Lipid Panel With LDL/HDL Ratio  LDL cholesterol, direct    CMP14+EGFR     3. Status post angioplasty with stent  Z95.820 Lipid Panel With LDL/HDL Ratio    LDL cholesterol, direct    CMP14+EGFR    4. Essential hypertension  I10 EKG 12-Lead    5. Type 2 diabetes mellitus without complication, without long-term current use of insulin (HCC)  E11.9     6. Mixed hyperlipidemia  E78.2     7. History of COVID-19  Z86.16     8. Class 3 severe obesity with serious comorbidity and body mass index (BMI) of 40.0 to 44.9 in adult, unspecified obesity type [facility])  E66.01    Z68.41        RECOMMENDATIONS: TEIRRA CARAPIA is a 71 y.o. female whose past medical history and cardiovascular risk factors include: Diabetes mellitus type 2, morbid obesity, obstructive sleep apnea on CPAP, mixed hyperlipidemia, chronic palpitations with history of PACs and PVCs, CAD status post PCI to the LAD 01/18/2019, postmenopausal female, advanced age.  Hypertriglyceridemia Triglycerides in August 2022 were approximately 300 mg/dL. Since then she is implemented lifestyle changes and also started Vascepa. Thereafter the triglycerides as of October 2022 had improved to 219 mg/dL. We will recheck fasting lipid profile. If her triglycerides are still not well controlled we will consider adding a low-dose fenofibrate. If we add fenofibrate to her medical regimen she will need to have repeat labs and follow-up in the clinic in 7 to 8 weeks after initiation of therapy.  Otherwise we will continue our 64-month follow-up visits.  Coronary artery disease involving native coronary artery of native heart without angina pectoris status post PCI Denies anginal discomfort. Medications reconciled. Most recent ischemic evaluation reviewed as a part of today's encounter.  Type 2 diabetes mellitus without complication, without long-term current use of insulin (HCC) Currently on ARB. Currently on statin and PCSK9 inhibitors. Educated on the importance of glycemic control and improving her other modifiable  cardiovascular risk factors  Essential hypertension Office blood pressures are within acceptable range. Reviewed the most recent blood pressure log as part of principal care management during today's encounter. Medications reconciled Low-salt diet recommended Continue current medical therapy.  Mixed hyperlipidemia Currently on maximally tolerated statin therapy. Currently on PCSK9 inhibitor. We will recheck a fasting lipid profile and CMP   FINAL MEDICATION LIST END OF ENCOUNTER: No orders of the defined types were placed in this encounter.     Current Outpatient Medications:    ACCU-CHEK AVIVA PLUS test strip, U UTD, Disp: , Rfl: 5   ACCU-CHEK SOFTCLIX LANCETS lancets, U UTD, Disp: , Rfl: 11   acetaminophen (TYLENOL) 500 MG tablet, Take 1,000-1,500 mg by mouth 2 (two) times daily as needed (pain)., Disp: , Rfl:    Alirocumab (PRALUENT) 150 MG/ML SOAJ, Inject 1 mL into the skin every 14 (fourteen) days., Disp: 2 mL, Rfl: 6   atorvastatin (LIPITOR) 80 MG tablet, Take 80 mg by mouth every evening. , Disp: , Rfl: 5   carvedilol (COREG) 12.5 MG tablet, TAKE 1 TABLET(12.5 MG) BY MOUTH TWICE DAILY, Disp: 180 tablet, Rfl: 0   cetirizine (ZYRTEC) 10 MG tablet, Take 10 mg by mouth at bedtime. , Disp: , Rfl:    chlorthalidone (HYGROTON) 25 MG tablet, Take 1 tablet (25 mg total) by mouth in the morning for 180 doses., Disp: 90 tablet, Rfl: 1   Cholecalciferol (VITAMIN D) 50 MCG (2000 UT) tablet, Take 2,000 Units by mouth daily., Disp: , Rfl:    clopidogrel (PLAVIX) 75  MG tablet, Take 75 mg by mouth daily., Disp: , Rfl:    COVID-19 mRNA Vac-TriS, Pfizer, SUSP injection, Inject into the muscle., Disp: 0.3 mL, Rfl: 0   empagliflozin (JARDIANCE) 10 MG TABS tablet, Take 10 mg by mouth daily., Disp: , Rfl:    eplerenone (INSPRA) 25 MG tablet, TAKE 1 TABLET(25 MG) BY MOUTH EVERY MORNING, Disp: 90 tablet, Rfl: 0   escitalopram (LEXAPRO) 20 MG tablet, Take 20 mg by mouth every evening. , Disp: , Rfl:     fluticasone (FLONASE) 50 MCG/ACT nasal spray, Place 1 spray into both nostrils as needed., Disp: , Rfl:    glimepiride (AMARYL) 4 MG tablet, Take 4 mg by mouth daily with breakfast. , Disp: , Rfl:    losartan (COZAAR) 100 MG tablet, Take 1 tablet (100 mg total) by mouth daily., Disp: 90 tablet, Rfl: 1   MAGnesium-Oxide 400 (240 Mg) MG tablet, Take 2 tablets (800 mg total) by mouth 3 (three) times daily., Disp: 360 tablet, Rfl: 0   meloxicam (MOBIC) 15 MG tablet, Take 15 mg by mouth daily., Disp: , Rfl:    metFORMIN (GLUCOPHAGE) 1000 MG tablet, Take 1 tablet (1,000 mg total) by mouth 2 (two) times daily with a meal., Disp: 60 tablet, Rfl: 0   Multiple Vitamin (MULTIVITAMIN WITH MINERALS) TABS tablet, Take 1 tablet by mouth daily., Disp: , Rfl:    nitroGLYCERIN (NITROSTAT) 0.4 MG SL tablet, PLACE 1 TABLET UNDER THE TONGUE EVERY 5 MINUTES AS NEEDED FOR CHEST PAIN, Disp: 25 tablet, Rfl: 3   ondansetron (ZOFRAN) 4 MG tablet, Take 1 tablet by mouth daily as needed., Disp: , Rfl:    pantoprazole (PROTONIX) 40 MG tablet, Take 1 tablet by mouth daily., Disp: , Rfl:    TRULICITY 3 JX/9.1YN SOPN, Inject 3 mg into the skin every Friday. , Disp: , Rfl:    VASCEPA 1 g capsule, TAKE 2 CAPSULES(2 GRAMS) BY MOUTH TWICE DAILY, Disp: 120 capsule, Rfl: 5  Orders Placed This Encounter  Procedures   Lipid Panel With LDL/HDL Ratio   LDL cholesterol, direct   CMP14+EGFR   EKG 12-Lead    --Continue cardiac medications as reconciled in final medication list. --Return in about 6 months (around 09/15/2021) for Follow up, CAD / HLD/ TAG. Or sooner if needed. --Continue follow-up with your primary care physician regarding the management of your other chronic comorbid conditions.  Patient's questions and concerns were addressed to her satisfaction. She voices understanding of the instructions provided during this encounter.   This note was created using a voice recognition software as a result there may be  grammatical errors inadvertently enclosed that do not reflect the nature of this encounter. Every attempt is made to correct such errors.  Rex Kras, [place], [name]  Pager: [phone] Office: [phone]

## 2021-03-22 ENCOUNTER — Other Ambulatory Visit: Payer: Self-pay

## 2021-03-22 ENCOUNTER — Other Ambulatory Visit: Payer: Self-pay | Admitting: Cardiology

## 2021-03-22 ENCOUNTER — Telehealth: Payer: Self-pay

## 2021-03-22 DIAGNOSIS — I2510 Atherosclerotic heart disease of native coronary artery without angina pectoris: Secondary | ICD-10-CM

## 2021-03-22 LAB — LIPID PANEL WITH LDL/HDL RATIO
Cholesterol, Total: 108 mg/dL (ref 100–199)
HDL: 46 mg/dL (ref >39)
LDL Chol Calc (NIH): 20 mg/dL (ref 0–99)
LDL/HDL Ratio: 0.4 ratio (ref 0.0–3.2)
Triglycerides: 290 mg/dL — ABNORMAL HIGH (ref 0–149)
VLDL Cholesterol Cal: 42 mg/dL — ABNORMAL HIGH (ref 5–40)

## 2021-03-22 LAB — CMP14+EGFR
ALT: 23 IU/L (ref 0–32)
AST: 18 IU/L (ref 0–40)
Albumin/Globulin Ratio: 2.1 (ref 1.2–2.2)
Albumin: 4.5 g/dL (ref 3.8–4.8)
Alkaline Phosphatase: 62 IU/L (ref 44–121)
BUN/Creatinine Ratio: 25 (ref 12–28)
BUN: 27 mg/dL (ref 8–27)
Bilirubin Total: 0.4 mg/dL (ref 0.0–1.2)
CO2: 26 mmol/L (ref 20–29)
Calcium: 9.9 mg/dL (ref 8.7–10.3)
Chloride: 95 mmol/L — ABNORMAL LOW (ref 96–106)
Creatinine, Ser: 1.08 mg/dL — ABNORMAL HIGH (ref 0.57–1.00)
Globulin, Total: 2.1 g/dL (ref 1.5–4.5)
Glucose: 161 mg/dL — ABNORMAL HIGH (ref 70–99)
Potassium: 4.9 mmol/L (ref 3.5–5.2)
Sodium: 144 mmol/L (ref 134–144)
Total Protein: 6.6 g/dL (ref 6.0–8.5)
eGFR: 56 mL/min/{1.73_m2} — ABNORMAL LOW (ref >59)

## 2021-03-22 LAB — LDL CHOLESTEROL, DIRECT: LDL Direct: 29 mg/dL (ref 0–99)

## 2021-03-22 MED ORDER — FENOFIBRATE 120 MG PO TABS: 120.0000 mg | ORAL_TABLET | Freq: Every day | ORAL | 0 refills | Status: DC

## 2021-03-22 NOTE — Progress Notes (Signed)
Spoke to patient she voiced understanding rx has been sent and labs have been released

## 2021-03-22 NOTE — Telephone Encounter (Signed)
Error

## 2021-04-04 ENCOUNTER — Other Ambulatory Visit: Payer: Self-pay

## 2021-04-04 DIAGNOSIS — I2510 Atherosclerotic heart disease of native coronary artery without angina pectoris: Secondary | ICD-10-CM

## 2021-04-16 ENCOUNTER — Encounter: Payer: Self-pay | Admitting: Cardiology

## 2021-04-16 ENCOUNTER — Other Ambulatory Visit: Payer: Self-pay

## 2021-04-16 MED ORDER — MAGNESIUM OXIDE -MG SUPPLEMENT 400 (240 MG) MG PO TABS: 2.0000 | ORAL_TABLET | Freq: Three times a day (TID) | ORAL | 0 refills | Status: DC

## 2021-04-29 ENCOUNTER — Telehealth: Payer: Self-pay

## 2021-04-29 NOTE — Telephone Encounter (Signed)
Patient called and stated that she was recently prescribed Fluconazole for 1 month, but was told she had to stop her taking her Atorvastatin during that time. She wants to know if this is safe for her to do? Please advise.  ?

## 2021-05-01 NOTE — Telephone Encounter (Signed)
Please change it to Crestor '40mg'$  po qhs so that she can take both statin and fluconazole.  ? ?ST

## 2021-05-03 ENCOUNTER — Other Ambulatory Visit: Payer: Self-pay

## 2021-05-03 MED ORDER — ROSUVASTATIN CALCIUM 40 MG PO TABS: 40.0000 mg | ORAL_TABLET | Freq: Every day | ORAL | 1 refills | Status: DC

## 2021-05-03 NOTE — Telephone Encounter (Signed)
done

## 2021-05-08 ENCOUNTER — Encounter: Payer: Self-pay | Admitting: Cardiology

## 2021-05-08 NOTE — Telephone Encounter (Signed)
From patient.

## 2021-06-03 ENCOUNTER — Other Ambulatory Visit: Payer: Self-pay | Admitting: Cardiology

## 2021-06-06 ENCOUNTER — Other Ambulatory Visit: Payer: Self-pay | Admitting: Cardiology

## 2021-06-06 DIAGNOSIS — I2510 Atherosclerotic heart disease of native coronary artery without angina pectoris: Secondary | ICD-10-CM

## 2021-07-02 ENCOUNTER — Other Ambulatory Visit: Payer: Self-pay | Admitting: Cardiology

## 2021-07-08 ENCOUNTER — Other Ambulatory Visit: Payer: Self-pay

## 2021-07-08 MED ORDER — PRALUENT 150 MG/ML ~~LOC~~ SOAJ: SUBCUTANEOUS | 6 refills | Status: DC

## 2021-07-16 ENCOUNTER — Other Ambulatory Visit: Payer: Self-pay | Admitting: Cardiology

## 2021-09-03 ENCOUNTER — Other Ambulatory Visit: Payer: Self-pay | Admitting: Cardiology

## 2021-09-03 DIAGNOSIS — I2510 Atherosclerotic heart disease of native coronary artery without angina pectoris: Secondary | ICD-10-CM

## 2021-09-12 LAB — CMP14+EGFR
ALT: 24 IU/L (ref 0–32)
AST: 14 IU/L (ref 0–40)
Albumin/Globulin Ratio: 1.9 (ref 1.2–2.2)
Albumin: 4.3 g/dL (ref 3.9–4.9)
Alkaline Phosphatase: 44 IU/L (ref 44–121)
BUN/Creatinine Ratio: 21 (ref 12–28)
BUN: 24 mg/dL (ref 8–27)
Bilirubin Total: 0.3 mg/dL (ref 0.0–1.2)
CO2: 26 mmol/L (ref 20–29)
Calcium: 10 mg/dL (ref 8.7–10.3)
Chloride: 99 mmol/L (ref 96–106)
Creatinine, Ser: 1.15 mg/dL — ABNORMAL HIGH (ref 0.57–1.00)
Globulin, Total: 2.3 g/dL (ref 1.5–4.5)
Glucose: 203 mg/dL — ABNORMAL HIGH (ref 70–99)
Potassium: 4.7 mmol/L (ref 3.5–5.2)
Sodium: 141 mmol/L (ref 134–144)
Total Protein: 6.6 g/dL (ref 6.0–8.5)
eGFR: 51 mL/min/{1.73_m2} — ABNORMAL LOW (ref >59)

## 2021-09-12 LAB — LDL CHOLESTEROL, DIRECT: LDL Direct: 15 mg/dL (ref 0–99)

## 2021-09-12 LAB — LIPID PANEL
Chol/HDL Ratio: 2.2 ratio (ref 0.0–4.4)
Cholesterol, Total: 84 mg/dL — ABNORMAL LOW (ref 100–199)
HDL: 39 mg/dL — ABNORMAL LOW (ref >39)
LDL Chol Calc (NIH): 13 mg/dL (ref 0–99)
Triglycerides: 204 mg/dL — ABNORMAL HIGH (ref 0–149)
VLDL Cholesterol Cal: 32 mg/dL (ref 5–40)

## 2021-09-16 ENCOUNTER — Ambulatory Visit: Payer: Medicare Other | Admitting: Cardiology

## 2021-09-16 ENCOUNTER — Encounter: Payer: Self-pay | Admitting: Cardiology

## 2021-09-16 VITALS — BP 154/80 | HR 78 | Temp 98.3°F | Resp 17 | Ht 64.0 in | Wt 253.0 lb

## 2021-09-16 DIAGNOSIS — I10 Essential (primary) hypertension: Secondary | ICD-10-CM

## 2021-09-16 DIAGNOSIS — E119 Type 2 diabetes mellitus without complications: Secondary | ICD-10-CM

## 2021-09-16 DIAGNOSIS — E781 Pure hyperglyceridemia: Secondary | ICD-10-CM

## 2021-09-16 DIAGNOSIS — I2510 Atherosclerotic heart disease of native coronary artery without angina pectoris: Secondary | ICD-10-CM

## 2021-09-16 DIAGNOSIS — E782 Mixed hyperlipidemia: Secondary | ICD-10-CM

## 2021-09-16 DIAGNOSIS — Z95820 Peripheral vascular angioplasty status with implants and grafts: Secondary | ICD-10-CM

## 2021-09-16 MED ORDER — SPIRONOLACTONE 25 MG PO TABS: 25.0000 mg | ORAL_TABLET | Freq: Every morning | ORAL | 0 refills | Status: DC

## 2021-09-16 MED ORDER — FENOFIBRATE 160 MG PO TABS: 160.0000 mg | ORAL_TABLET | Freq: Every day | ORAL | 1 refills | Status: DC

## 2021-09-16 NOTE — Patient Instructions (Signed)
Recommend that you take some antihypertensive medications in the morning and some in the evening. A.m.: Carvedilol Chlorthalidone Spironolactone  PM: Carvedilol Losartan

## 2021-09-16 NOTE — Progress Notes (Signed)
[name] [name] Date of Birth: 1950/04/17 MRN: [id number] [place], [name], [place] Former Cardiology Providers: Jeri Lager, APRN, FNP-C Primary Cardiologist: Rex Kras, DO, [facility] (established care 11/03/2019)  Date: 09/16/21 Last Office Visit: March 18, 2021  Chief Complaint  Patient presents with   Coronary Artery Disease   Hyperlipidemia   Follow-up    6 month    HPI  [name] [name] is a 71 y.o.  female whose past medical history and cardiovascular risk factors include: Diabetes mellitus type 2, morbid obesity, obstructive sleep apnea on CPAP, mixed hyperlipidemia, hypertriglyceridemia, chronic palpitations with history of PACs and PVCs, CAD status post PCI to the LAD 01/18/2019, postmenopausal female, advanced age.  Patient is being followed by the practice for her underlying CAD status post PCI to the LAD.  Since then we have been working on improving her modifiable cardiovascular risk factors.  She is enrolled in the principal care management and home blood pressures have been running between 140-150 mmHg on visual estimation.  Her recent labs from July 2023 independently reviewed.  LDL at excellent goal but triglyceride levels are not optimized but overall improving.  She has a long history of hypertriglyceridemia with levels as high as 300 mg/dL.  At that time she was started on Vascepa and repeat blood work noted triglycerides at 219 mg/dL.  Following that she has been on fenofibrate 120 mg p.o. daily and her most recent triglyceride levels 204 mg/dL.  Of note, patient was on Jardiance given her diabetes and heart disease.  However this resulted in yeast infections which required treatment for couple months.  She is no longer on this class of medication.  Otherwise patient is doing well from a cardiovascular standpoint denies anginal discomfort or heart failure symptoms.  FUNCTIONAL STATUS: No structured exercise program or daily routine.    ALLERGIES: Allergies  Allergen Reactions   Codeine Palpitations   MEDICATION LIST PRIOR TO VISIT: Current Outpatient Medications on File Prior to Visit  Medication Sig Dispense Refill   ACCU-CHEK AVIVA PLUS test strip U UTD  5   ACCU-CHEK SOFTCLIX LANCETS lancets U UTD  11   acetaminophen (TYLENOL) 500 MG tablet Take 1,000-1,500 mg by mouth 2 (two) times daily as needed (pain).     Alirocumab (PRALUENT) 150 MG/ML SOAJ INJECT 1 ML INTO THE SKIN EVERY 14 DAYS 2 mL 6   atorvastatin (LIPITOR) 80 MG tablet Take 80 mg by mouth daily.     carvedilol (COREG) 12.5 MG tablet TAKE 1 TABLET(12.5 MG) BY MOUTH TWICE DAILY 180 tablet 0   cetirizine (ZYRTEC) 10 MG tablet Take 10 mg by mouth at bedtime.      Cholecalciferol (VITAMIN D) 50 MCG (2000 UT) tablet Take 2,000 Units by mouth daily.     clopidogrel (PLAVIX) 75 MG tablet Take 75 mg by mouth daily.     COVID-19 mRNA Vac-TriS, Pfizer, SUSP injection Inject into the muscle. 0.3 mL 0   escitalopram (LEXAPRO) 20 MG tablet Take 20 mg by mouth every evening.      fluticasone (FLONASE) 50 MCG/ACT nasal spray Place 1 spray into both nostrils as needed.     glimepiride (AMARYL) 4 MG tablet Take 4 mg by mouth daily with breakfast.      losartan (COZAAR) 100 MG tablet Take 1 tablet (100 mg total) by mouth daily. 90 tablet 1   magnesium oxide (MAG-OX) 400 (240 Mg) MG tablet TAKE 2 TABLETS(800 MG) BY MOUTH THREE TIMES DAILY 360 tablet 0  meloxicam (MOBIC) 15 MG tablet Take 15 mg by mouth daily.     metFORMIN (GLUCOPHAGE) 1000 MG tablet Take 1 tablet (1,000 mg total) by mouth 2 (two) times daily with a meal. 60 tablet 0   Multiple Vitamin (MULTIVITAMIN WITH MINERALS) TABS tablet Take 1 tablet by mouth daily.     nitroGLYCERIN (NITROSTAT) 0.4 MG SL tablet PLACE 1 TABLET UNDER THE TONGUE EVERY 5 MINUTES AS NEEDED FOR CHEST PAIN 25 tablet 3   pantoprazole (PROTONIX) 40 MG tablet Take 1 tablet by mouth daily.     TRULICITY 3 QJ/1.9ER SOPN Inject 3 mg into the  skin every Friday.      VASCEPA 1 g capsule TAKE 2 CAPSULES(2 GRAMS) BY MOUTH TWICE DAILY 120 capsule 5   chlorthalidone (HYGROTON) 25 MG tablet Take 1 tablet (25 mg total) by mouth in the morning for 180 doses. 90 tablet 1   No current facility-administered medications on file prior to visit.    PAST MEDICAL HISTORY: Past Medical History:  Diagnosis Date   Anxiety    Carpal tunnel syndrome of right wrist    Coronary artery disease    per cath 11-29-2013  mild diffuse CAD and calcification in proximal LAD   Depression    Diverticulosis    Essential hypertension    GERD (gastroesophageal reflux disease)    Heart disease    History of endometrial cancer 05/2003   FIGO, Grade 1  s/p  TAG w/ BSO   HTN (hypertension)    Hyperlipidemia    OA (osteoarthritis)    OSA on CPAP    followed by dr Elsworth Soho-- per study 03-26-2012  mod. to severe OSA , AHI 37/hr   Type II diabetes mellitus ([place])    followed by pcp   Urinary incontinence in female    Vitamin D deficiency     PAST SURGICAL HISTORY: Past Surgical History:  Procedure Laterality Date   CARDIAC CATHETERIZATION  11/29/2013   dr Einar Gip   mild diffuse noncritial CAD and mild calcification involving the proximal LAD,  LVEF 55-60%   CARPAL TUNNEL RELEASE Right 07/17/2017   Procedure: CARPAL TUNNEL RELEASE;  Surgeon: Dorna Leitz, MD;  Location: [place];  Service: Orthopedics;  Laterality: Right;   CHOLECYSTECTOMY     COLONOSCOPY WITH PROPOFOL N/A 01/31/2020   Procedure: COLONOSCOPY WITH PROPOFOL;  Surgeon: Juanita Craver, MD;  Location: [facility];  Service: Endoscopy;  Laterality: N/A;   CORONARY ATHERECTOMY N/A 01/18/2019   Procedure: CORONARY ATHERECTOMY;  Surgeon: Adrian Prows, MD;  Location: [place] [facility];  Service: Cardiovascular;  Laterality: N/A;   CORONARY STENT INTERVENTION N/A 01/18/2019   Procedure: CORONARY STENT INTERVENTION;  Surgeon: Adrian Prows, MD;  Location: [place] [facility];  Service:  Cardiovascular;  Laterality: N/A;   KNEE ARTHROSCOPY Left 10/ 2004;  05-17-2008   dr graves   KNEE ARTHROSCOPY Right 11/2012   LAPAROSCOPIC CHOLECYSTECTOMY  11-19-2006  dr Zella Richer  [facility]   LEFT HEART CATH AND CORONARY ANGIOGRAPHY N/A 01/18/2019   Procedure: LEFT HEART CATH AND CORONARY ANGIOGRAPHY;  Surgeon: Adrian Prows, MD;  Location: [place] [facility];  Service: Cardiovascular;  Laterality: N/A;   LEFT HEART CATHETERIZATION WITH CORONARY ANGIOGRAM N/A 11/29/2013   Procedure: LEFT HEART CATHETERIZATION WITH CORONARY ANGIOGRAM;  Surgeon: Laverda Page, MD;  Location: [facility];  Service: Cardiovascular;  Laterality: N/A;   TOTAL ABDOMINAL HYSTERECTOMY W/ BILATERAL SALPINGOOPHORECTOMY  06-20-2003    dr Mohammed Kindle  [facility]   endometrial  cancer   TOTAL HIP ARTHROPLASTY Right 09/02/2013   Procedure: RIGHT TOTAL HIP ARTHROPLASTY ANTERIOR APPROACH;  Surgeon: Alta Corning, MD;  Location: [place];  Service: Orthopedics;  Laterality: Right;   TOTAL KNEE ARTHROPLASTY Left 08/02/2012   Procedure: TOTAL KNEE ARTHROPLASTY;  Surgeon: Alta Corning, MD;  Location: [place];  Service: Orthopedics;  Laterality: Left;  left total knee arthroplasty   TUBAL LIGATION  yrs ago    FAMILY HISTORY: The patient's family history includes Breast cancer in her sister; Cancer in her sister; Diabetes in her brother, father, mother, and sister; Diabetes (age of onset: 59) in her sister; Diabetes type II in her father; Heart disease (age of onset: 87) in her brother and father; Heart disease (age of onset: 81) in her sister; High Cholesterol in her father and mother; Hypertension in her brother, father, mother, and sister; Lung cancer (age of onset: 26) in her father; Lupus in an other family member; Obesity in her mother; Pancreatic cancer in an other family member; Sudden death in her mother; Thyroid disease in her mother and sister.   SOCIAL HISTORY:  The patient  reports that she has never smoked. She has never used smokeless  tobacco. She reports that she does not drink alcohol and does not use drugs.  Review of Systems  Constitutional: Negative for chills, fever and malaise/fatigue.  HENT:  Negative for hoarse voice and nosebleeds.   Eyes:  Negative for discharge, double vision and pain.  Cardiovascular:  Negative for chest pain, claudication, dyspnea on exertion, leg swelling, near-syncope, orthopnea, palpitations, paroxysmal nocturnal dyspnea and syncope.  Respiratory:  Negative for hemoptysis and shortness of breath.   Musculoskeletal:  Negative for muscle cramps and myalgias.  Gastrointestinal:  Negative for abdominal pain, constipation, diarrhea, hematemesis, hematochezia, melena, nausea and vomiting.  Neurological:  Negative for dizziness and light-headedness.    PHYSICAL EXAM:    09/16/2021    1:41 PM 09/16/2021    1:10 PM 09/16/2021    1:00 PM  Vitals with BMI  Height   '5\' 4"'$   Weight   253 lbs  BMI   08.65  Systolic 784 696 295  Diastolic 80 79 72  Pulse 78 71 70    CONSTITUTIONAL: Well-developed and well-nourished. No acute distress.  SKIN: Skin is warm and dry. No rash noted. No cyanosis. No pallor. No jaundice HEAD: Normocephalic and atraumatic.  EYES: No scleral icterus MOUTH/THROAT: Moist oral membranes.  NECK: No JVD present. No thyromegaly noted. No carotid bruits  CHEST Normal respiratory effort. No intercostal retractions  LUNGS: Clear to auscultation bilaterally.  No stridor. No wheezes. No rales.  CARDIOVASCULAR: Regular rate and rhythm, positive S1-S2, no murmurs rubs or gallops appreciated. ABDOMINAL: Obese, soft, nontender, nondistended, positive bowel sounds in all 4 quadrants, no apparent ascites.  EXTREMITIES: Trace bilateral peripheral edema, warm to touch. HEMATOLOGIC: No significant bruising NEUROLOGIC: Oriented to person, place, and time. Nonfocal. Normal muscle tone.  PSYCHIATRIC: Normal mood and affect. Normal behavior. Cooperative  CARDIAC  DATABASE: EKG: 09/16/2021: NSR, 68 bpm, consider old anterior infarct, without underlying injury pattern.  Echocardiogram: 06/13/2020:    Poor echo window. Normal LV systolic function with EF 70%. Left ventricle cavity is normal in size. Mild concentric remodeling of the left  ventricle. Normal global wall motion. Doppler evidence of grade I (impaired) diastolic dysfunction, normal LAP. Calculated EF 70%.  Left atrial cavity is mildly dilated at 3.9 cm.  Right ventricle cavity is slightly dilated. Normal right ventricular function.  No significant change from 11/16/2018.  Stress Testing:  Leane Call Stress Test 12/27/2018: 1.  Resting EKG normal sinus rhythm, stress EKG nondiagnostic due to pharmacologic stress. 2.  Left ventricle is dilated both in rest and stress images, LV end-diastolic volume 694 mL. Perfusion images reveal a small sized mild to moderate amount of ischemia in the apical inferior and periapical region.  LVEF elevated at 47%.  This is an intermediate risk study. No previous exam available for comparison.  Heart Catheterization: 01/18/2019: Normal LV systolic function, normal LVEDP.  No pressure gradient across the aortic valve. RCA is dominant and has mild disease.  Circumflex coronary artery is moderate-sized vessel with mild disease. LAD is a large-caliber vessel, with high-grade 95% proximal stenosis followed by tandem 70 to 80% stenosis.  Moderate amount of calcification evident. Successful orbital atherectomy followed by stenting with 3.5 x 30 mm resolute Onyx at 12 atmospheric pressure for 60 seconds followed by postdilatation with 3.5 x 18 mm  balloon at 16 atmospheric pressure x2.  Stenosis reduced to 0% with TIMI-3 to TIMI-3 flow.  Carotid duplex: [12/27/2013]: No evidence of hemodynamically significant stenosis in the bilateral carotid bifurcation vessels.  LABORATORY DATA:    Latest Ref Rng & Units 01/14/2019    1:31 PM 07/09/2018    4:57 AM 07/07/2018     2:30 AM  CBC  WBC 3.4 - 10.8 x10E3/uL 7.9  15.7  17.1   Hemoglobin 11.1 - 15.9 g/dL 13.1  13.3  12.9   Hematocrit 34.0 - 46.6 % 39.6  41.4  38.7   Platelets 150 - 450 x10E3/uL 282  334  318        Latest Ref Rng & Units 09/11/2021    9:20 AM 03/21/2021    8:22 AM 10/24/2020    7:49 AM  CMP  Glucose 70 - 99 mg/dL 203  161  141   BUN 8 - 27 mg/dL '24  27  20   '$ Creatinine 0.57 - 1.00 mg/dL 1.15  1.08  1.09   Sodium 134 - 144 mmol/L 141  144  141   Potassium 3.5 - 5.2 mmol/L 4.7  4.9  4.9   Chloride 96 - 106 mmol/L 99  95  99   CO2 20 - 29 mmol/L '26  26  25   '$ Calcium 8.7 - 10.3 mg/dL 10.0  9.9  10.1   Total Protein 6.0 - 8.5 g/dL 6.6  6.6  6.9   Total Bilirubin 0.0 - 1.2 mg/dL 0.3  0.4  0.3   Alkaline Phos 44 - 121 IU/L 44  62  67   AST 0 - 40 IU/L '14  18  18   '$ ALT 0 - 32 IU/L '24  23  26     '$ Lipid Panel  Lab Results  Component Value Date   CHOL 84 (L) 09/11/2021   HDL 39 (L) 09/11/2021   LDLCALC 13 09/11/2021   LDLDIRECT 15 09/11/2021   TRIG 204 (H) 09/11/2021   CHOLHDL 2.2 09/11/2021    Lab Results  Component Value Date   HGBA1C 7.4 (H) 10/27/2018   HGBA1C 6.7 (H) 02/01/2018   HGBA1C 6.7 (H) 10/06/2017   No components found for: "NTPROBNP" Lab Results  Component Value Date   TSH 1.960 03/21/2020   TSH 2.300 10/27/2018   TSH 3.010 04/29/2017    Cardiac Panel (last 3 results) No results for input(s): "CKTOTAL", "CKMB", "TROPONINIHS", "RELINDX" in the last 72 hours.  IMPRESSION:    ICD-10-CM  1. Coronary artery disease involving native coronary artery of native heart without angina pectoris  I25.10 EKG 12-Lead    2. Status post angioplasty with stent  Z95.820     3. Hypertriglyceridemia  E78.1 fenofibrate 160 MG tablet    4. Essential hypertension  I10 spironolactone (ALDACTONE) 25 MG tablet    Basic metabolic panel    Magnesium    5. Type 2 diabetes mellitus without complication, without long-term current use of insulin (HCC)  E11.9     6. Mixed  hyperlipidemia  E78.2     7. Class 3 severe obesity with serious comorbidity and body mass index (BMI) of 40.0 to 44.9 in adult, unspecified obesity type [facility])  E66.01    Z68.41        RECOMMENDATIONS: BRIANDA BEITLER is a 71 y.o. female whose past medical history and cardiovascular risk factors include: Diabetes mellitus type 2, morbid obesity, obstructive sleep apnea on CPAP, mixed hyperlipidemia, chronic palpitations with history of PACs and PVCs, CAD status post PCI to the LAD 01/18/2019, postmenopausal female, advanced age.  Coronary artery disease involving native coronary artery of native heart without angina pectoris / status post angioplasty with stent Denies angina pectoris. EKG: Nonischemic. Recent ischemic work-up reviewed as part of today's office visit. Educated on importance of secondary prevention. No additional cardiovascular testing warranted at this time.  Hypertriglyceridemia 300 mg/dL back in August 2022.  Subsequently started on Vascepa and levels improved to 219 mg/dL.  At the last office visit started on fenofibrate and most recent triglyceride levels 204 mg/dL. We will uptitrate fenofibrate to 160 mg p.o. daily. Labs in 6 weeks to evaluate LFTs and lipid profile.   Educated the patient with regards to reducing foods that are high in triglyceride levels. Encouraged increasing physical activity to 30 minutes a day 5 days a week as tolerated.  Essential hypertension Office blood pressures are not well controlled. Jardiance were discontinued since last visit due to yeast infections. Start spironolactone 25 mg p.o. daily Labs in 1 week to evaluate kidney function and electrolytes  Type 2 diabetes mellitus without complication, without long-term current use of insulin (HCC) Currently on ARB, statin, PCSK9 inhibitor. Did not tolerate Jardiance due to yeast infections. Reemphasized the importance of glycemic control.  Mixed hyperlipidemia LDL within excellent  control. Continue maximally tolerated statins along with PCSK9 inhibitors.  Class 3 severe obesity with serious comorbidity and body mass index (BMI) of 40.0 to 44.9 in adult, unspecified obesity type (HCC) Body mass index is 43.43 kg/m. I reviewed with the patient the importance of diet, regular physical activity/exercise, weight loss.   Patient is educated on increasing physical activity gradually as tolerated.  With the goal of moderate intensity exercise for 30 minutes a day 5 days a week.   FINAL MEDICATION LIST END OF ENCOUNTER: Meds ordered this encounter  Medications   fenofibrate 160 MG tablet    Sig: Take 1 tablet (160 mg total) by mouth daily.    Dispense:  90 tablet    Refill:  1   spironolactone (ALDACTONE) 25 MG tablet    Sig: Take 1 tablet (25 mg total) by mouth every morning.    Dispense:  90 tablet    Refill:  0      Current Outpatient Medications:    ACCU-CHEK AVIVA PLUS test strip, U UTD, Disp: , Rfl: 5   ACCU-CHEK SOFTCLIX LANCETS lancets, U UTD, Disp: , Rfl: 11   acetaminophen (TYLENOL) 500 MG tablet, Take 1,000-1,500 mg  by mouth 2 (two) times daily as needed (pain)., Disp: , Rfl:    Alirocumab (PRALUENT) 150 MG/ML SOAJ, INJECT 1 ML INTO THE SKIN EVERY 14 DAYS, Disp: 2 mL, Rfl: 6   atorvastatin (LIPITOR) 80 MG tablet, Take 80 mg by mouth daily., Disp: , Rfl:    carvedilol (COREG) 12.5 MG tablet, TAKE 1 TABLET(12.5 MG) BY MOUTH TWICE DAILY, Disp: 180 tablet, Rfl: 0   cetirizine (ZYRTEC) 10 MG tablet, Take 10 mg by mouth at bedtime. , Disp: , Rfl:    Cholecalciferol (VITAMIN D) 50 MCG (2000 UT) tablet, Take 2,000 Units by mouth daily., Disp: , Rfl:    clopidogrel (PLAVIX) 75 MG tablet, Take 75 mg by mouth daily., Disp: , Rfl:    COVID-19 mRNA Vac-TriS, Pfizer, SUSP injection, Inject into the muscle., Disp: 0.3 mL, Rfl: 0   escitalopram (LEXAPRO) 20 MG tablet, Take 20 mg by mouth every evening. , Disp: , Rfl:    fenofibrate 160 MG tablet, Take 1 tablet (160 mg  total) by mouth daily., Disp: 90 tablet, Rfl: 1   fluticasone (FLONASE) 50 MCG/ACT nasal spray, Place 1 spray into both nostrils as needed., Disp: , Rfl:    glimepiride (AMARYL) 4 MG tablet, Take 4 mg by mouth daily with breakfast. , Disp: , Rfl:    losartan (COZAAR) 100 MG tablet, Take 1 tablet (100 mg total) by mouth daily., Disp: 90 tablet, Rfl: 1   magnesium oxide (MAG-OX) 400 (240 Mg) MG tablet, TAKE 2 TABLETS(800 MG) BY MOUTH THREE TIMES DAILY, Disp: 360 tablet, Rfl: 0   meloxicam (MOBIC) 15 MG tablet, Take 15 mg by mouth daily., Disp: , Rfl:    metFORMIN (GLUCOPHAGE) 1000 MG tablet, Take 1 tablet (1,000 mg total) by mouth 2 (two) times daily with a meal., Disp: 60 tablet, Rfl: 0   Multiple Vitamin (MULTIVITAMIN WITH MINERALS) TABS tablet, Take 1 tablet by mouth daily., Disp: , Rfl:    nitroGLYCERIN (NITROSTAT) 0.4 MG SL tablet, PLACE 1 TABLET UNDER THE TONGUE EVERY 5 MINUTES AS NEEDED FOR CHEST PAIN, Disp: 25 tablet, Rfl: 3   pantoprazole (PROTONIX) 40 MG tablet, Take 1 tablet by mouth daily., Disp: , Rfl:    spironolactone (ALDACTONE) 25 MG tablet, Take 1 tablet (25 mg total) by mouth every morning., Disp: 90 tablet, Rfl: 0   TRULICITY 3 TM/1.9QQ SOPN, Inject 3 mg into the skin every Friday. , Disp: , Rfl:    VASCEPA 1 g capsule, TAKE 2 CAPSULES(2 GRAMS) BY MOUTH TWICE DAILY, Disp: 120 capsule, Rfl: 5   chlorthalidone (HYGROTON) 25 MG tablet, Take 1 tablet (25 mg total) by mouth in the morning for 180 doses., Disp: 90 tablet, Rfl: 1  Orders Placed This Encounter  Procedures   Basic metabolic panel   Magnesium   EKG 12-Lead    --Continue cardiac medications as reconciled in final medication list. --Return for Follow up, CAD, Lipid. Or sooner if needed. --Continue follow-up with your primary care physician regarding the management of your other chronic comorbid conditions.  Patient's questions and concerns were addressed to her satisfaction. She voices understanding of the  instructions provided during this encounter.   This note was created using a voice recognition software as a result there may be grammatical errors inadvertently enclosed that do not reflect the nature of this encounter. Every attempt is made to correct such errors.  Rex Kras, [place], [facility]  Pager: [phone] Office: [phone]

## 2021-09-30 ENCOUNTER — Ambulatory Visit: Payer: Medicare Other | Admitting: Adult Health

## 2021-10-01 ENCOUNTER — Other Ambulatory Visit: Payer: Self-pay

## 2021-10-01 ENCOUNTER — Ambulatory Visit: Payer: Medicare Other | Attending: Orthopedic Surgery

## 2021-10-01 DIAGNOSIS — M25551 Pain in right hip: Secondary | ICD-10-CM | POA: Diagnosis present

## 2021-10-01 DIAGNOSIS — M6281 Muscle weakness (generalized): Secondary | ICD-10-CM | POA: Diagnosis present

## 2021-10-01 NOTE — Therapy (Signed)
OUTPATIENT PHYSICAL THERAPY LOWER EXTREMITY EVALUATION   Patient Name: [name] [name] MRN: [id number] DOB:[birth year], 71 y.o., female Today's Date: 10/01/2021   PT End of Session - 10/01/21 1031     Visit Number 1    Number of Visits 2    Date for PT Re-Evaluation 10/11/21    PT Start Time 1034    PT Stop Time 1119    PT Time Calculation (min) 45 min    Activity Tolerance Patient tolerated treatment well    Behavior During Therapy [facility] for tasks assessed/performed             Past Medical History:  Diagnosis Date   Anxiety    Carpal tunnel syndrome of right wrist    Coronary artery disease    per cath 11-29-2013  mild diffuse CAD and calcification in proximal LAD   Depression    Diverticulosis    Essential hypertension    GERD (gastroesophageal reflux disease)    Heart disease    History of endometrial cancer 05/2003   FIGO, Grade 1  s/p  TAG w/ BSO   HTN (hypertension)    Hyperlipidemia    OA (osteoarthritis)    OSA on CPAP    followed by dr Elsworth Soho-- per study 03-26-2012  mod. to severe OSA , AHI 37/hr   Type II diabetes mellitus ([place])    followed by pcp   Urinary incontinence in female    Vitamin D deficiency    Past Surgical History:  Procedure Laterality Date   CARDIAC CATHETERIZATION  11/29/2013   dr Einar Gip   mild diffuse noncritial CAD and mild calcification involving the proximal LAD,  LVEF 55-60%   CARPAL TUNNEL RELEASE Right 07/17/2017   Procedure: CARPAL TUNNEL RELEASE;  Surgeon: Dorna Leitz, MD;  Location: [place];  Service: Orthopedics;  Laterality: Right;   CHOLECYSTECTOMY     COLONOSCOPY WITH PROPOFOL N/A 01/31/2020   Procedure: COLONOSCOPY WITH PROPOFOL;  Surgeon: Juanita Craver, MD;  [facility];  Service: Endoscopy;  Laterality: N/A;   CORONARY ATHERECTOMY N/A 01/18/2019   Procedure: CORONARY ATHERECTOMY;  Surgeon: Adrian Prows, MD;  Location: [place] [facility];  Service: Cardiovascular;  Laterality: N/A;   CORONARY  STENT INTERVENTION N/A 01/18/2019   Procedure: CORONARY STENT INTERVENTION;  Surgeon: Adrian Prows, MD;  Location: [place] [facility];  Service: Cardiovascular;  Laterality: N/A;   KNEE ARTHROSCOPY Left 10/ 2004;  05-17-2008   dr graves   KNEE ARTHROSCOPY Right 11/2012   LAPAROSCOPIC CHOLECYSTECTOMY  11-19-2006  dr Zella Richer  [facility]   LEFT HEART CATH AND CORONARY ANGIOGRAPHY N/A 01/18/2019   Procedure: LEFT HEART CATH AND CORONARY ANGIOGRAPHY;  Surgeon: Adrian Prows, MD;  Location: [place] [facility];  Service: Cardiovascular;  Laterality: N/A;   LEFT HEART CATHETERIZATION WITH CORONARY ANGIOGRAM N/A 11/29/2013   Procedure: LEFT HEART CATHETERIZATION WITH CORONARY ANGIOGRAM;  Surgeon: Laverda Page, MD;  Location: [facility] CATH LAB;  Service: Cardiovascular;  Laterality: N/A;   TOTAL ABDOMINAL HYSTERECTOMY W/ BILATERAL SALPINGOOPHORECTOMY  06-20-2003    dr Mohammed Kindle  [facility]   endometrial cancer   TOTAL HIP ARTHROPLASTY Right 09/02/2013   Procedure: RIGHT TOTAL HIP ARTHROPLASTY ANTERIOR APPROACH;  Surgeon: Alta Corning, MD;  Location: [place];  Service: Orthopedics;  Laterality: Right;   TOTAL KNEE ARTHROPLASTY Left 08/02/2012   Procedure: TOTAL KNEE ARTHROPLASTY;  Surgeon: Alta Corning, MD;  Location: [place];  Service: Orthopedics;  Laterality: Left;  left total knee arthroplasty  TUBAL LIGATION  yrs ago   Patient Active Problem List   Diagnosis Date Noted   Depression with anxiety 05/09/2020   Mild neurocognitive disorder 05/09/2020   Coronary artery disease 01/18/2019   Class 3 severe obesity with serious comorbidity and body mass index (BMI) of 40.0 to 44.9 in adult [facility]) 10/13/2018   DM (diabetes mellitus), type 2 ([place]) 06/30/2018   Essential hypertension 06/30/2018   Right carpal tunnel syndrome 07/17/2017   Other fatigue 04/29/2017   Shortness of breath on exertion 04/29/2017   Vitamin D deficiency 04/29/2017   Hypersomnolence 09/60/4540   Nonalcoholic fatty liver disease without nonalcoholic  steatohepatitis (NASH) 02/12/2016   Gastroesophageal reflux disease with esophagitis 11/23/2014   History of endometrial cancer 08/25/2014   Mixed hyperlipidemia 08/25/2014   Morbid obesity with alveolar hypoventilation ([place]) 08/25/2014   Bilateral carotid bruits 11/30/2013   Dyspnea on exertion 11/30/2013   Angina pectoris ([place]) 11/29/2013   Osteoarthritis of right hip 09/02/2013   Cystocele 07/03/2013   Vaginal pessary present 04/25/2013   Prolapse of vaginal vault after hysterectomy 04/25/2013   Endometrial cancer, grade I ([place]) 04/25/2013   Unspecified vitamin D deficiency 04/25/2013   Personal history of other medical treatment 04/25/2013   Osteoarthritis of left knee 08/02/2012   Stress incontinence 06/07/2012   Essential hypertension with goal blood pressure less than 130/85 06/07/2012   Diabetes ([place]) 06/07/2012   Obstructive sleep apnea 02/16/2012    PCP: Vicenta Aly, FNP  REFERRING PROVIDER: Dorna Leitz, MD  REFERRING DIAG: PT eval and tx  Right Trochanteric Bursitis H/o Hip replacement 2015   THERAPY DIAG:  Pain in right hip  Muscle weakness (generalized)  Rationale for Evaluation and Treatment Rehabilitation  ONSET DATE: about 3 weeks ago  SUBJECTIVE:   SUBJECTIVE STATEMENT: Patient reports that she has been having right hip pain that began about 3 weeks ago with no known cause. She notes that it feels about the same as it did when it first began. She has tried using medication, stretches, and ice to try to help with her pain, but none of these had helped. She had pain like this before prior to her hip replacement in 2015. She had a cortisone injection at her last appointment, but this only lasted about 2 days.   PERTINENT HISTORY: History of COVID-19,   PAIN:  Are you having pain? Yes: NPRS scale: 7-8/10 Pain location: Right hip radiating down quadriceps Pain description: throbbing, constant Aggravating factors: laying on her right side Relieving  factors: none found  PRECAUTIONS: None  WEIGHT BEARING RESTRICTIONS No  FALLS:  Has patient fallen in last 6 months? No  LIVING ENVIRONMENT: Lives with: lives with their family Lives in: House/apartment Stairs: No Has following equipment at home: Ramped entry  [place]: retired  PLOF: Independent  NEXT MD FOLLOW UP: 10/07/21  PATIENT GOALS reduced pain, work in her garden, watch her grandchildren, sleep better (unable to sleep last night, 7-8 hours prior to pain)    OBJECTIVE:   COGNITION:  Overall cognitive status: Within functional limits for tasks assessed     SENSATION: Patient reports that her right thigh "does not feel the same." Increased sensitivity along right quadriceps   POSTURE: rounded shoulders, forward head, and flexed trunk   PALPATION: TTP: right hip flexor and quadriceps  LOWER EXTREMITY ROM: WFL for activities assess, but unable to measure hip ROM due to pain with supine positioning   LOWER EXTREMITY MMT:  MMT Right eval Left eval  Hip flexion 4-/5  with familiar pain  4/5  Hip extension    Hip abduction    Hip adduction    Hip internal rotation    Hip external rotation    Knee flexion 4+/5 with strain along quadriceps 5/5  Knee extension 4-/5 with pulling along quadriceps 5/5  Ankle dorsiflexion 4/5 4/5  Ankle plantarflexion    Ankle inversion    Ankle eversion     (Blank rows = not tested)  GAIT: Assistive device utilized: None Level of assistance: Complete Independence Comments: decreased gait speed, stride lenght    TODAY'S TREATMENT:                                    EXERCISE LOG  Exercise Repetitions and Resistance Comments  Quad sets  Attempted, but limited due to familiar pain   Hip adduction isometric 15 reps with 5 second hold   Seated clams 20 reps             Blank cell = exercise not performed today  Modalities: no redness or adverse reaction to today's modalities  Date:  Unattended Estim: right proximal  quadriceps, pre mod; 80-150 Hz, 15 mins, Pain   PATIENT EDUCATION:  Education details: HEP, POC, healing  Person educated: Patient Education method: Explanation Education comprehension: verbalized understanding   HOME EXERCISE PROGRAM: [id number]  ASSESSMENT:  CLINICAL IMPRESSION: Patient is a 71 y.o. female who was seen today for physical therapy evaluation and treatment for right hip and thigh pain. She presented with moderate pain severity and irritability with quadriceps engagement being the most aggravating to her familiar symptoms. Her HEP was updated and she was able to demonstrate these interventions properly. However, electrical stimulation to her right quadriceps was the most effective at reducing her familiar pain. Recommend that she continue with skilled physical therapy to address her remaining impairments to return to her prior level of function.     OBJECTIVE IMPAIRMENTS Abnormal gait, decreased activity tolerance, decreased mobility, difficulty walking, decreased strength, impaired sensation, postural dysfunction, and pain.   ACTIVITY LIMITATIONS carrying, lifting, standing, squatting, sleeping, transfers, and locomotion level  PARTICIPATION LIMITATIONS: community activity and yard work  [place] Past/current experiences and 3+ comorbidities: HTN, DM, OA, and history of cancer  are also affecting patient's functional outcome.   REHAB POTENTIAL: Good  CLINICAL DECISION MAKING: Evolving/moderate complexity  EVALUATION COMPLEXITY: Moderate   GOALS: Goals reviewed with patient? Yes  LONG TERM GOALS: Target date:  10/11/21  Patient will be independent with her HEP.  Baseline:  Goal status: INITIAL  2.  Patient will be able to complete her daily activities without her familiar pain exceeding 4/10. Baseline:  Goal status: INITIAL   PLAN: PT FREQUENCY: 1-2x/week  PT DURATION: 2 weeks  PLANNED INTERVENTIONS: Therapeutic exercises, Therapeutic activity,  Neuromuscular re-education, Balance training, Gait training, Patient/Family education, Self Care, Joint mobilization, Electrical stimulation, Cryotherapy, Moist heat, Vasopneumatic device, Manual therapy, and Re-evaluation  PLAN FOR NEXT SESSION: nustep, update HEP, and modalities as needed   Darlin Coco, PT 10/01/2021, 12:43 PM

## 2021-10-02 ENCOUNTER — Encounter (INDEPENDENT_AMBULATORY_CARE_PROVIDER_SITE_OTHER): Payer: Self-pay

## 2021-10-02 ENCOUNTER — Other Ambulatory Visit: Payer: Self-pay | Admitting: Cardiology

## 2021-10-03 LAB — BASIC METABOLIC PANEL
BUN/Creatinine Ratio: 21 (ref 12–28)
BUN: 27 mg/dL (ref 8–27)
CO2: 25 mmol/L (ref 20–29)
Calcium: 10.2 mg/dL (ref 8.7–10.3)
Chloride: 98 mmol/L (ref 96–106)
Creatinine, Ser: 1.26 mg/dL — ABNORMAL HIGH (ref 0.57–1.00)
Glucose: 141 mg/dL — ABNORMAL HIGH (ref 70–99)
Potassium: 5 mmol/L (ref 3.5–5.2)
Sodium: 140 mmol/L (ref 134–144)
eGFR: 46 mL/min/{1.73_m2} — ABNORMAL LOW (ref >59)

## 2021-10-03 LAB — MAGNESIUM: Magnesium: 1.7 mg/dL (ref 1.6–2.3)

## 2021-10-04 ENCOUNTER — Encounter: Payer: Self-pay | Admitting: Physical Therapy

## 2021-10-04 ENCOUNTER — Ambulatory Visit: Payer: Medicare Other | Admitting: Physical Therapy

## 2021-10-04 DIAGNOSIS — M25551 Pain in right hip: Secondary | ICD-10-CM | POA: Diagnosis not present

## 2021-10-04 DIAGNOSIS — M6281 Muscle weakness (generalized): Secondary | ICD-10-CM

## 2021-10-04 NOTE — Progress Notes (Signed)
Tried calling no answer left a vm

## 2021-10-04 NOTE — Therapy (Addendum)
OUTPATIENT PHYSICAL THERAPY LOWER EXTREMITY TREATMENT   Patient Name: [name] [name] MRN: [id number] DOB:[birth year], 71 y.o., female Today's Date: 10/04/2021   PT End of Session - 10/04/21 1119     Visit Number 2    Number of Visits 2    Date for PT Re-Evaluation 10/11/21    PT Start Time 1115    PT Stop Time 1157    PT Time Calculation (min) 42 min    Activity Tolerance Patient tolerated treatment well    Behavior During Therapy [facility] for tasks assessed/performed             Past Medical History:  Diagnosis Date   Anxiety    Carpal tunnel syndrome of right wrist    Coronary artery disease    per cath 11-29-2013  mild diffuse CAD and calcification in proximal LAD   Depression    Diverticulosis    Essential hypertension    GERD (gastroesophageal reflux disease)    Heart disease    History of endometrial cancer 05/2003   FIGO, Grade 1  s/p  TAG w/ BSO   HTN (hypertension)    Hyperlipidemia    OA (osteoarthritis)    OSA on CPAP    followed by dr Elsworth Soho-- per study 03-26-2012  mod. to severe OSA , AHI 37/hr   Type II diabetes mellitus ([place])    followed by pcp   Urinary incontinence in female    Vitamin D deficiency    Past Surgical History:  Procedure Laterality Date   CARDIAC CATHETERIZATION  11/29/2013   dr Einar Gip   mild diffuse noncritial CAD and mild calcification involving the proximal LAD,  LVEF 55-60%   CARPAL TUNNEL RELEASE Right 07/17/2017   Procedure: CARPAL TUNNEL RELEASE;  Surgeon: Dorna Leitz, MD;  Location: [place];  Service: Orthopedics;  Laterality: Right;   CHOLECYSTECTOMY     COLONOSCOPY WITH PROPOFOL N/A 01/31/2020   Procedure: COLONOSCOPY WITH PROPOFOL;  Surgeon: Juanita Craver, MD;  [facility];  Service: Endoscopy;  Laterality: N/A;   CORONARY ATHERECTOMY N/A 01/18/2019   Procedure: CORONARY ATHERECTOMY;  Surgeon: Adrian Prows, MD;  Location: [place] [facility];  Service: Cardiovascular;  Laterality: N/A;   CORONARY  STENT INTERVENTION N/A 01/18/2019   Procedure: CORONARY STENT INTERVENTION;  Surgeon: Adrian Prows, MD;  Location: [place] [facility];  Service: Cardiovascular;  Laterality: N/A;   KNEE ARTHROSCOPY Left 10/ 2004;  05-17-2008   dr graves   KNEE ARTHROSCOPY Right 11/2012   LAPAROSCOPIC CHOLECYSTECTOMY  11-19-2006  dr Zella Richer  [facility]   LEFT HEART CATH AND CORONARY ANGIOGRAPHY N/A 01/18/2019   Procedure: LEFT HEART CATH AND CORONARY ANGIOGRAPHY;  Surgeon: Adrian Prows, MD;  Location: [place] CV LAB;  Service: Cardiovascular;  Laterality: N/A;   LEFT HEART CATHETERIZATION WITH CORONARY ANGIOGRAM N/A 11/29/2013   Procedure: LEFT HEART CATHETERIZATION WITH CORONARY ANGIOGRAM;  Surgeon: Laverda Page, MD;  Location: [facility] CATH LAB;  Service: Cardiovascular;  Laterality: N/A;   TOTAL ABDOMINAL HYSTERECTOMY W/ BILATERAL SALPINGOOPHORECTOMY  06-20-2003    dr Mohammed Kindle  [facility]   endometrial cancer   TOTAL HIP ARTHROPLASTY Right 09/02/2013   Procedure: RIGHT TOTAL HIP ARTHROPLASTY ANTERIOR APPROACH;  Surgeon: Alta Corning, MD;  Location: [place];  Service: Orthopedics;  Laterality: Right;   TOTAL KNEE ARTHROPLASTY Left 08/02/2012   Procedure: TOTAL KNEE ARTHROPLASTY;  Surgeon: Alta Corning, MD;  Location: [place];  Service: Orthopedics;  Laterality: Left;  left total knee arthroplasty  TUBAL LIGATION  yrs ago   Patient Active Problem List   Diagnosis Date Noted   Depression with anxiety 05/09/2020   Mild neurocognitive disorder 05/09/2020   Coronary artery disease 01/18/2019   Class 3 severe obesity with serious comorbidity and body mass index (BMI) of 40.0 to 44.9 in adult [facility]) 10/13/2018   DM (diabetes mellitus), type 2 ([place]) 06/30/2018   Essential hypertension 06/30/2018   Right carpal tunnel syndrome 07/17/2017   Other fatigue 04/29/2017   Shortness of breath on exertion 04/29/2017   Vitamin D deficiency 04/29/2017   Hypersomnolence 67/01/4579   Nonalcoholic fatty liver disease without nonalcoholic  steatohepatitis ([facility]) 02/12/2016   Gastroesophageal reflux disease with esophagitis 11/23/2014   History of endometrial cancer 08/25/2014   Mixed hyperlipidemia 08/25/2014   Morbid obesity with alveolar hypoventilation ([place]) 08/25/2014   Bilateral carotid bruits 11/30/2013   Dyspnea on exertion 11/30/2013   Angina pectoris ([place]) 11/29/2013   Osteoarthritis of right hip 09/02/2013   Cystocele 07/03/2013   Vaginal pessary present 04/25/2013   Prolapse of vaginal vault after hysterectomy 04/25/2013   Endometrial cancer, grade I ([place]) 04/25/2013   Unspecified vitamin D deficiency 04/25/2013   Personal history of other medical treatment 04/25/2013   Osteoarthritis of left knee 08/02/2012   Stress incontinence 06/07/2012   Essential hypertension with goal blood pressure less than 130/85 06/07/2012   Diabetes ([place]) 06/07/2012   Obstructive sleep apnea 02/16/2012    PCP: Vicenta Aly, FNP  REFERRING PROVIDER: Dorna Leitz, MD  REFERRING DIAG: PT eval and tx  Right Trochanteric Bursitis H/o Hip replacement 2015   THERAPY DIAG:  Pain in right hip  Muscle weakness (generalized)  Rationale for Evaluation and Treatment Rehabilitation  ONSET DATE: about 3 weeks ago  SUBJECTIVE:   SUBJECTIVE STATEMENT: More pain in posterior and lateral hip today.  PERTINENT HISTORY: History of COVID-19,   PAIN:  Are you having pain? Yes, 7/10. R posteriolateral hip  PRECAUTIONS: None  WEIGHT BEARING RESTRICTIONS No   OBJECTIVE:   TODAY'S TREATMENT:                                    EXERCISE LOG  Exercise Repetitions and Resistance Comments  Nustep L2 x15 min   Hip flexion X15 reps fatigued  Hip abduction X20 reps   HS curl  X20 reps   Seated HS stretch 3x30 sec   Seated figure 4 stretch 3x1 min   Hip adductor squeeze X20 reps   Clam  Green x20 reps   Hip flexion Green x20 reps    Blank cell = exercise not performed today   PATIENT EDUCATION:  Education details: HEP,  POC, healing  Person educated: Patient Education method: Explanation Education comprehension: verbalized understanding   HOME EXERCISE PROGRAM: [id number]  ASSESSMENT:  CLINICAL IMPRESSION: Patient presented in clinic with reports of some hip pain but stated that Nustep helped to alleviate some pain. Patient progressed through stretching and strengthening within comfort level. Patient states that she has HEP provided at eval and from her MD. Patient is to request more visits.  PHYSICAL THERAPY DISCHARGE SUMMARY  Visits from Start of Care: 2  Current functional level related to goals / functional outcomes: Patient was unable to meet her goals for skilled physical therapy.    Remaining deficits: Pain   Education / Equipment: HEP   Patient agrees to discharge. Patient goals were not met. Patient is being discharged  due to the physician's request.  Jacqulynn Cadet, PT, DPT    OBJECTIVE IMPAIRMENTS Abnormal gait, decreased activity tolerance, decreased mobility, difficulty walking, decreased strength, impaired sensation, postural dysfunction, and pain.   ACTIVITY LIMITATIONS carrying, lifting, standing, squatting, sleeping, transfers, and locomotion level  PARTICIPATION LIMITATIONS: community activity and yard work  [place] Past/current experiences and 3+ comorbidities: HTN, DM, OA, and history of cancer  are also affecting patient's functional outcome.   REHAB POTENTIAL: Good  CLINICAL DECISION MAKING: Evolving/moderate complexity  EVALUATION COMPLEXITY: Moderate   GOALS: Goals reviewed with patient? Yes  LONG TERM GOALS: Target date:  10/11/21  Patient will be independent with her HEP.  Baseline:  Goal status: INITIAL  2.  Patient will be able to complete her daily activities without her familiar pain exceeding 4/10. Baseline:  Goal status: INITIAL   PLAN: PT FREQUENCY: 1-2x/week  PT DURATION: 2 weeks  PLANNED INTERVENTIONS: Therapeutic exercises,  Therapeutic activity, Neuromuscular re-education, Balance training, Gait training, Patient/Family education, Self Care, Joint mobilization, Electrical stimulation, Cryotherapy, Moist heat, Vasopneumatic device, Manual therapy, and Re-evaluation  PLAN FOR NEXT SESSION: nustep, update HEP, and modalities as needed   Standley Brooking, PTA 10/04/2021, 12:16 PM

## 2021-10-14 ENCOUNTER — Other Ambulatory Visit: Payer: Self-pay

## 2021-10-14 DIAGNOSIS — I2510 Atherosclerotic heart disease of native coronary artery without angina pectoris: Secondary | ICD-10-CM

## 2021-10-14 DIAGNOSIS — I10 Essential (primary) hypertension: Secondary | ICD-10-CM

## 2021-10-14 NOTE — Progress Notes (Signed)
Called and spoke to patient she voiced understanding and orders have been placed

## 2021-10-15 ENCOUNTER — Other Ambulatory Visit: Payer: Self-pay | Admitting: Cardiology

## 2021-10-21 ENCOUNTER — Other Ambulatory Visit: Payer: Self-pay | Admitting: Cardiology

## 2021-10-31 ENCOUNTER — Other Ambulatory Visit: Payer: Self-pay | Admitting: Cardiology

## 2021-11-04 MED ORDER — MAGNESIUM OXIDE -MG SUPPLEMENT 400 (240 MG) MG PO TABS: 400.0000 mg | ORAL_TABLET | Freq: Every day | ORAL | 3 refills | Status: DC

## 2021-11-06 ENCOUNTER — Other Ambulatory Visit: Payer: Self-pay | Admitting: Cardiology

## 2021-11-06 DIAGNOSIS — I10 Essential (primary) hypertension: Secondary | ICD-10-CM

## 2021-11-29 ENCOUNTER — Other Ambulatory Visit: Payer: Self-pay | Admitting: Cardiology

## 2021-11-29 DIAGNOSIS — I2510 Atherosclerotic heart disease of native coronary artery without angina pectoris: Secondary | ICD-10-CM

## 2021-12-13 ENCOUNTER — Other Ambulatory Visit (HOSPITAL_BASED_OUTPATIENT_CLINIC_OR_DEPARTMENT_OTHER): Payer: Self-pay

## 2021-12-13 MED ORDER — COMIRNATY 30 MCG/0.3ML IM SUSY
PREFILLED_SYRINGE | INTRAMUSCULAR | 0 refills | Status: DC
  Filled 2021-12-13: qty 0.3, 1d supply, fill #0

## 2021-12-21 ENCOUNTER — Other Ambulatory Visit: Payer: Self-pay | Admitting: Cardiology

## 2021-12-21 DIAGNOSIS — I10 Essential (primary) hypertension: Secondary | ICD-10-CM

## 2021-12-23 ENCOUNTER — Other Ambulatory Visit: Payer: Self-pay | Admitting: Cardiology

## 2022-01-22 ENCOUNTER — Ambulatory Visit (HOSPITAL_BASED_OUTPATIENT_CLINIC_OR_DEPARTMENT_OTHER): Payer: Medicare Other | Admitting: Pulmonary Disease

## 2022-01-28 ENCOUNTER — Encounter (HOSPITAL_BASED_OUTPATIENT_CLINIC_OR_DEPARTMENT_OTHER): Payer: Self-pay | Admitting: Pulmonary Disease

## 2022-01-28 ENCOUNTER — Ambulatory Visit (INDEPENDENT_AMBULATORY_CARE_PROVIDER_SITE_OTHER): Payer: Medicare Other | Admitting: Pulmonary Disease

## 2022-01-28 VITALS — BP 130/58 | HR 72 | Temp 99.0°F | Ht 63.5 in | Wt 253.2 lb

## 2022-01-28 DIAGNOSIS — I2510 Atherosclerotic heart disease of native coronary artery without angina pectoris: Secondary | ICD-10-CM

## 2022-01-28 DIAGNOSIS — G4733 Obstructive sleep apnea (adult) (pediatric): Secondary | ICD-10-CM

## 2022-01-28 NOTE — Patient Instructions (Signed)
X CPAP supplies will be renewed for a year including small mask

## 2022-01-28 NOTE — Progress Notes (Signed)
Subjective:    Patient ID: [name] [name], female    DOB: [birth year], 71 y.o.   MRN: [id number]  HPI  71 yo diabetic, hypertensive never smoker followed for obstructive sleep apnea    PMH - COVID-19 pneumonia with acute respiratory failure in May 2020 12/2018 cardiac stent With cylindrical diabetes type 2  Chief Complaint  Patient presents with   Follow-up    Pt states she was put on insulin for her diabetes. Pt states that her CPAP does not feel like enough air is coming through and she is unrested when waking.   She was last seen 02/2019 and presents to reestablish care Since she had COVID in 2020, she reports brain fog.  She does not drive.  Overall this is clearing.  She has seen neurology for this. OSA was diagnosed in 2014.  She is maintained on auto CPAP 12 to 16 cm with a fullface mask.  She reports a large leak with medium size mask.  Bedtime is around 11 PM to midnight, wakes up around 8 AM , no problems with pressure, she wonders if she can get more air. She reports being sleepy in the afternoon sometimes and takes a nap. She reports intermittent dyspnea Weight is unchanged over the past 3 years  Significant tests/ events reviewed  NPSG 02/2012:  AHI 37/hr, desat to 78% Auto 2014:  Optimal pressure 16cm. 12/2012:  Decrease cpap to 14cm (10 pound weight loss).   05/2016 >>PAP titration  16/12 cm H2O    Past Medical History:  Diagnosis Date   Anxiety    Carpal tunnel syndrome of right wrist    Coronary artery disease    per cath 11-29-2013  mild diffuse CAD and calcification in proximal LAD   Depression    Diverticulosis    Essential hypertension    GERD (gastroesophageal reflux disease)    Heart disease    History of endometrial cancer 05/2003   FIGO, Grade 1  s/p  TAG w/ BSO   HTN (hypertension)    Hyperlipidemia    OA (osteoarthritis)    OSA on CPAP    followed by dr Elsworth Soho-- per study 03-26-2012  mod. to severe OSA , AHI 37/hr   Type II diabetes  mellitus ([place])    followed by pcp   Urinary incontinence in female    Vitamin D deficiency     Past Surgical History:  Procedure Laterality Date   CARDIAC CATHETERIZATION  11/29/2013   dr Einar Gip   mild diffuse noncritial CAD and mild calcification involving the proximal LAD,  LVEF 55-60%   CARPAL TUNNEL RELEASE Right 07/17/2017   Procedure: CARPAL TUNNEL RELEASE;  Surgeon: Dorna Leitz, MD;  Location: [facility];  Service: Orthopedics;  Laterality: Right;   CHOLECYSTECTOMY     COLONOSCOPY WITH PROPOFOL N/A 01/31/2020   Procedure: COLONOSCOPY WITH PROPOFOL;  Surgeon: Juanita Craver, MD;  Location: [facility];  Service: Endoscopy;  Laterality: N/A;   CORONARY ATHERECTOMY N/A 01/18/2019   Procedure: CORONARY ATHERECTOMY;  Surgeon: Adrian Prows, MD;  Location: [facility];  Service: Cardiovascular;  Laterality: N/A;   CORONARY STENT INTERVENTION N/A 01/18/2019   Procedure: CORONARY STENT INTERVENTION;  Surgeon: Adrian Prows, MD;  Location: [place] [facility];  Service: Cardiovascular;  Laterality: N/A;   KNEE ARTHROSCOPY Left 10/ 2004;  05-17-2008   dr graves   KNEE ARTHROSCOPY Right 11/2012   LAPAROSCOPIC CHOLECYSTECTOMY  11-19-2006  dr Zella Richer  [facility]   LEFT HEART CATH  AND CORONARY ANGIOGRAPHY N/A 01/18/2019   Procedure: LEFT HEART CATH AND CORONARY ANGIOGRAPHY;  Surgeon: Adrian Prows, MD;  Location: [place] [facility];  Service: Cardiovascular;  Laterality: N/A;   LEFT HEART CATHETERIZATION WITH CORONARY ANGIOGRAM N/A 11/29/2013   Procedure: LEFT HEART CATHETERIZATION WITH CORONARY ANGIOGRAM;  Surgeon: Laverda Page, MD;  Location: [facility];  Service: Cardiovascular;  Laterality: N/A;   TOTAL ABDOMINAL HYSTERECTOMY W/ BILATERAL SALPINGOOPHORECTOMY  06-20-2003    dr Mohammed Kindle  [facility]   endometrial cancer   TOTAL HIP ARTHROPLASTY Right 09/02/2013   Procedure: RIGHT TOTAL HIP ARTHROPLASTY ANTERIOR APPROACH;  Surgeon: Alta Corning, MD;  Location: [place];  Service: Orthopedics;   Laterality: Right;   TOTAL KNEE ARTHROPLASTY Left 08/02/2012   Procedure: TOTAL KNEE ARTHROPLASTY;  Surgeon: Alta Corning, MD;  Location: [place];  Service: Orthopedics;  Laterality: Left;  left total knee arthroplasty   TUBAL LIGATION  yrs ago    Allergies  Allergen Reactions   Codeine Palpitations     Social History   Socioeconomic History   Marital status: Married    Spouse name: [name] [name]   Number of children: 3   Years of education: Not on file   Highest education level: 11th grade  Occupational History   Occupation: housewife  Tobacco Use   Smoking status: Never   Smokeless tobacco: Never  Vaping Use   Vaping Use: Never used  Substance and Sexual Activity   Alcohol use: Never   Drug use: Never   Sexual activity: Not on file  Other Topics Concern   Not on file  Social History Narrative   Lives at home with husband and son   Right handed   Caffeine: coffee 2 cups/day   Social Determinants of Health   Financial Resource Strain: Not on file  Food Insecurity: Not on file  Transportation Needs: Not on file  Physical Activity: Not on file  Stress: Not on file  Social Connections: Not on file  Intimate Partner Violence: Not on file    Family History  Problem Relation Age of Onset   Hypertension Mother    Diabetes Mother    High Cholesterol Mother    Sudden death Mother    Thyroid disease Mother    Obesity Mother    Heart disease Father 49       CABG   Lung cancer Father 74       lung   Hypertension Father    Diabetes Father    High Cholesterol Father    Diabetes type II Father    Heart disease Sister 6       CABG   Diabetes Sister 17   Breast cancer Sister    Hypertension Sister        x2    Diabetes Sister    Thyroid disease Sister    Cancer Sister        high grade Neuroendocrine carcinoma right axilla   Heart disease Brother 28       MI with stents   Hypertension Brother    Diabetes Brother    Pancreatic cancer Other        x 3 - 2  MAunts and 1 P Aunts.    Lupus Other        Pat Aunt   Dementia Neg Hx    Alzheimer's disease Neg Hx      Review of Systems neg for any significant sore throat, dysphagia, itching, sneezing, nasal congestion  or excess/ purulent secretions, fever, chills, sweats, unintended wt loss, pleuritic or exertional cp, hempoptysis, orthopnea pnd or change in chronic leg swelling. Also denies presyncope, palpitations, heartburn, abdominal pain, nausea, vomiting, diarrhea or change in bowel or urinary habits, dysuria,hematuria, rash, arthralgias, visual complaints, headache, numbness weakness or ataxia.     Objective:   Physical Exam  Gen. Pleasant, obese, in no distress, normal affect ENT - no pallor,icterus, no post nasal drip, class 2-3 airway Neck: No JVD, no thyromegaly, no carotid bruits Lungs: no use of accessory muscles, no dullness to percussion, decreased without rales or rhonchi  Cardiovascular: Rhythm regular, heart sounds  normal, no murmurs or gallops, no peripheral edema Abdomen: soft and non-tender, no hepatosplenomegaly, BS normal. Musculoskeletal: No deformities, no cyanosis or clubbing Neuro:  alert, non focal, no tremors       Assessment & Plan:

## 2022-01-28 NOTE — Assessment & Plan Note (Signed)
CPAP download was reviewed which shows excellent control of events on auto settings 12 to 16 cm with average pressure 14 and maximum pressure of 15 cm. CPAP is certainly helped improve her daytime somnolence and fatigue.  Will provide her with a small mask instead of medium to decrease the leak.  She is very compliant.  Weight loss encouraged, compliance with goal of at least 4-6 hrs every night is the expectation. Advised against medications with sedative side effects Cautioned against driving when sleepy - understanding that sleepiness will vary on a day to day basis

## 2022-02-11 ENCOUNTER — Other Ambulatory Visit: Payer: Self-pay | Admitting: Cardiology

## 2022-02-11 DIAGNOSIS — I10 Essential (primary) hypertension: Secondary | ICD-10-CM

## 2022-03-20 ENCOUNTER — Ambulatory Visit: Payer: Medicare Other | Admitting: Cardiology

## 2022-03-20 ENCOUNTER — Encounter: Payer: Self-pay | Admitting: Cardiology

## 2022-03-20 VITALS — BP 130/78 | HR 80 | Ht 63.5 in | Wt 247.2 lb

## 2022-03-20 DIAGNOSIS — E781 Pure hyperglyceridemia: Secondary | ICD-10-CM

## 2022-03-20 DIAGNOSIS — Z95820 Peripheral vascular angioplasty status with implants and grafts: Secondary | ICD-10-CM

## 2022-03-20 DIAGNOSIS — I2510 Atherosclerotic heart disease of native coronary artery without angina pectoris: Secondary | ICD-10-CM

## 2022-03-20 DIAGNOSIS — I10 Essential (primary) hypertension: Secondary | ICD-10-CM

## 2022-03-20 DIAGNOSIS — E119 Type 2 diabetes mellitus without complications: Secondary | ICD-10-CM

## 2022-03-20 DIAGNOSIS — E782 Mixed hyperlipidemia: Secondary | ICD-10-CM

## 2022-03-20 NOTE — Progress Notes (Signed)
[name] [name] Date of Birth: 1950/03/28 MRN: [id number] [name] [name], [name] [name] Former Cardiology Providers: Jeri Lager, APRN, FNP-C Primary Cardiologist: Rex Kras, DO, [facility] (established care 11/03/2019)  Date: 03/20/22 Last Office Visit: 09/16/2021  Chief Complaint  Patient presents with   Coronary artery disease involving native coronary artery of   Follow-up    HPI  [name] [name] is a 72 y.o.  female whose past medical history and cardiovascular risk factors include: Diabetes mellitus type 2, morbid obesity, obstructive sleep apnea on CPAP, mixed hyperlipidemia, hypertriglyceridemia, chronic palpitations with history of PACs and PVCs, CAD status post PCI to the LAD 01/18/2019, postmenopausal female, advanced age.  Patient is being followed by the practice given her history of CAD status post PCI to the LAD.  Since establishing care she is working on improving her modifiable cardiovascular risk factors.  She is enrolled into remote patient monitoring for ambulatory blood pressure which averages 129/63 with a pulse of 70.  Despite being on high intensity statin therapy she was started on PCSK9 inhibitor to help improve LDL levels.  Most recent labs from December 2023 available in [place] reviewed.  LDL is currently at goal.  Her triglyceride levels have been difficult to manage.  Despite being on Vascepa and fenofibrate her triglyceride levels are still not at goal.  I have asked her to be more cognizant with reducing foods that are high in triglycerides.  She denies anginal discomfort or heart failure symptoms.  FUNCTIONAL STATUS: No structured exercise program or daily routine.   ALLERGIES: Allergies  Allergen Reactions   Sglt2 Inhibitors Rash    Extensive candidiasis   Codeine Palpitations and Other (See Comments)   MEDICATION LIST PRIOR TO VISIT: Current Outpatient Medications on File Prior to Visit  Medication Sig Dispense Refill    ACCU-CHEK AVIVA PLUS test strip U UTD  5   ACCU-CHEK SOFTCLIX LANCETS lancets U UTD  11   acetaminophen (TYLENOL) 500 MG tablet Take 1,000-1,500 mg by mouth 2 (two) times daily as needed (pain).     Alirocumab (PRALUENT) 150 MG/ML SOAJ ADMINISTER 1 ML UNDER THE SKIN EVERY 14 DAYS 2 mL 6   atorvastatin (LIPITOR) 80 MG tablet Take 80 mg by mouth daily.     carvedilol (COREG) 12.5 MG tablet TAKE 1 TABLET(12.5 MG) BY MOUTH TWICE DAILY 180 tablet 0   cetirizine (ZYRTEC) 10 MG tablet Take 10 mg by mouth at bedtime.      chlorthalidone (HYGROTON) 25 MG tablet Take 1 tablet (25 mg total) by mouth in the morning for 180 doses. 90 tablet 1   Cholecalciferol (VITAMIN D) 50 MCG (2000 UT) tablet Take 2,000 Units by mouth daily.     clopidogrel (PLAVIX) 75 MG tablet Take 75 mg by mouth daily.     escitalopram (LEXAPRO) 20 MG tablet Take 20 mg by mouth every evening.      fenofibrate 160 MG tablet Take 1 tablet (160 mg total) by mouth daily. 90 tablet 1   fluticasone (FLONASE) 50 MCG/ACT nasal spray Place 1 spray into both nostrils as needed.     gabapentin (NEURONTIN) 300 MG capsule Take 300 mg by mouth at bedtime.     glimepiride (AMARYL) 4 MG tablet Take 4 mg by mouth daily with breakfast.      LEVEMIR 100 UNIT/ML injection Inject 20 Units into the skin in the morning.     losartan (COZAAR) 100 MG tablet Take 1 tablet (100 mg total) by mouth daily. [place]  tablet 1   magnesium oxide (MAG-OX) 400 (240 Mg) MG tablet Take 1 tablet (400 mg total) by mouth daily. 90 tablet 3   meloxicam (MOBIC) 15 MG tablet Take 15 mg by mouth daily.     metFORMIN (GLUCOPHAGE) 1000 MG tablet Take 1 tablet (1,000 mg total) by mouth 2 (two) times daily with a meal. 60 tablet 0   Multiple Vitamin (MULTIVITAMIN WITH MINERALS) TABS tablet Take 1 tablet by mouth daily.     nitroGLYCERIN (NITROSTAT) 0.4 MG SL tablet PLACE 1 TABLET UNDER THE TONGUE EVERY 5 MINUTES AS NEEDED FOR CHEST PAIN 25 tablet 3   pantoprazole (PROTONIX) 40 MG tablet  Take 1 tablet by mouth daily.     spironolactone (ALDACTONE) 25 MG tablet TAKE 1 TABLET(25 MG) BY MOUTH EVERY MORNING 90 tablet 0   TRULICITY 4.5 ZH/2.9JM SOPN SMARTSIG:4.5 Milligram(s) SUB-Q Once a Week     VASCEPA 1 g capsule TAKE 2 CAPSULES(2 GRAMS) BY MOUTH TWICE DAILY 120 capsule 5   No current facility-administered medications on file prior to visit.    PAST MEDICAL HISTORY: Past Medical History:  Diagnosis Date   Anxiety    Carpal tunnel syndrome of right wrist    Coronary artery disease    per cath 11-29-2013  mild diffuse CAD and calcification in proximal LAD   Depression    Diverticulosis    Essential hypertension    GERD (gastroesophageal reflux disease)    Heart disease    History of endometrial cancer 05/2003   FIGO, Grade 1  s/p  TAG w/ BSO   HTN (hypertension)    Hyperlipidemia    OA (osteoarthritis)    OSA on CPAP    followed by dr Elsworth Soho-- per study 03-26-2012  mod. to severe OSA , AHI 37/hr   Type II diabetes mellitus ([place])    followed by pcp   Urinary incontinence in female    Vitamin D deficiency     PAST SURGICAL HISTORY: Past Surgical History:  Procedure Laterality Date   CARDIAC CATHETERIZATION  11/29/2013   dr Einar Gip   mild diffuse noncritial CAD and mild calcification involving the proximal LAD,  LVEF 55-60%   CARPAL TUNNEL RELEASE Right 07/17/2017   Procedure: CARPAL TUNNEL RELEASE;  Surgeon: Dorna Leitz, MD;  Location: [place];  Service: Orthopedics;  Laterality: Right;   CHOLECYSTECTOMY     COLONOSCOPY WITH PROPOFOL N/A 01/31/2020   Procedure: COLONOSCOPY WITH PROPOFOL;  Surgeon: Juanita Craver, MD;  Location: [facility];  Service: Endoscopy;  Laterality: N/A;   CORONARY ATHERECTOMY N/A 01/18/2019   Procedure: CORONARY ATHERECTOMY;  Surgeon: Adrian Prows, MD;  Location: [place] [facility];  Service: Cardiovascular;  Laterality: N/A;   CORONARY STENT INTERVENTION N/A 01/18/2019   Procedure: CORONARY STENT INTERVENTION;  Surgeon:  Adrian Prows, MD;  Location: [place] [facility];  Service: Cardiovascular;  Laterality: N/A;   KNEE ARTHROSCOPY Left 10/ 2004;  05-17-2008   dr graves   KNEE ARTHROSCOPY Right 11/2012   LAPAROSCOPIC CHOLECYSTECTOMY  11-19-2006  dr Zella Richer  [facility]   LEFT HEART CATH AND CORONARY ANGIOGRAPHY N/A 01/18/2019   Procedure: LEFT HEART CATH AND CORONARY ANGIOGRAPHY;  Surgeon: Adrian Prows, MD;  Location: [place] [facility];  Service: Cardiovascular;  Laterality: N/A;   LEFT HEART CATHETERIZATION WITH CORONARY ANGIOGRAM N/A 11/29/2013   Procedure: LEFT HEART CATHETERIZATION WITH CORONARY ANGIOGRAM;  Surgeon: Laverda Page, MD;  Location: [facility] C[facility] LAB;  Service: Cardiovascular;  Laterality: N/A;   TOTAL ABDOMINAL  HYSTERECTOMY W/ BILATERAL SALPINGOOPHORECTOMY  06-20-2003    dr Mohammed Kindle  [facility]   endometrial cancer   TOTAL HIP ARTHROPLASTY Right 09/02/2013   Procedure: RIGHT TOTAL HIP ARTHROPLASTY ANTERIOR APPROACH;  Surgeon: Alta Corning, MD;  Location: [place];  Service: Orthopedics;  Laterality: Right;   TOTAL KNEE ARTHROPLASTY Left 08/02/2012   Procedure: TOTAL KNEE ARTHROPLASTY;  Surgeon: Alta Corning, MD;  Location: [place];  Service: Orthopedics;  Laterality: Left;  left total knee arthroplasty   TUBAL LIGATION  yrs ago    FAMILY HISTORY: The patient's family history includes Breast cancer in her sister; Cancer in her sister; Diabetes in her brother, father, mother, and sister; Diabetes (age of onset: 61) in her sister; Diabetes type II in her father; Heart disease (age of onset: 88) in her brother and father; Heart disease (age of onset: 23) in her sister; High Cholesterol in her father and mother; Hypertension in her brother, father, mother, and sister; Lung cancer (age of onset: 46) in her father; Lupus in an other family member; Obesity in her mother; Pancreatic cancer in an other family member; Sudden death in her mother; Thyroid disease in her mother and sister.   SOCIAL HISTORY:  The patient  reports  that she has never smoked. She has never used smokeless tobacco. She reports that she does not drink alcohol and does not use drugs.  Review of Systems  Constitutional: Negative for chills, fever and malaise/fatigue.  HENT:  Negative for hoarse voice and nosebleeds.   Eyes:  Negative for discharge, double vision and pain.  Cardiovascular:  Negative for chest pain, claudication, dyspnea on exertion, leg swelling, near-syncope, orthopnea, palpitations, paroxysmal nocturnal dyspnea and syncope.  Respiratory:  Negative for hemoptysis and shortness of breath.   Musculoskeletal:  Negative for muscle cramps and myalgias.  Gastrointestinal:  Negative for abdominal pain, constipation, diarrhea, hematemesis, hematochezia, melena, nausea and vomiting.  Neurological:  Negative for dizziness and light-headedness.    PHYSICAL EXAM:    03/20/2022    2:51 PM 03/20/2022    1:50 PM 01/28/2022    3:41 PM  Vitals with BMI  Height  5' 3.5" 5' 3.5"  Weight  247 lbs 3 oz 253 lbs 3 oz  BMI  29.7 98.92  Systolic 119 417 408  Diastolic 78 82 58  Pulse 80 73 72    CONSTITUTIONAL: Well-developed and well-nourished. No acute distress.  SKIN: Skin is warm and dry. No rash noted. No cyanosis. No pallor. No jaundice HEAD: Normocephalic and atraumatic.  EYES: No scleral icterus MOUTH/THROAT: Moist oral membranes.  NECK: No JVD present. No thyromegaly noted. No carotid bruits  CHEST Normal respiratory effort. No intercostal retractions  LUNGS: Clear to auscultation bilaterally.  No stridor. No wheezes. No rales.  CARDIOVASCULAR: Regular rate and rhythm, positive S1-S2, no murmurs rubs or gallops appreciated. ABDOMINAL: Obese, soft, nontender, nondistended, positive bowel sounds in all 4 quadrants, no apparent ascites.  EXTREMITIES: Trace bilateral peripheral edema, warm to touch. HEMATOLOGIC: No significant bruising NEUROLOGIC: Oriented to person, place, and time. Nonfocal. Normal muscle tone.  PSYCHIATRIC:  Normal mood and affect. Normal behavior. Cooperative  CARDIAC DATABASE: EKG: 03/20/2022: Sinus rhythm, 72 bpm, without underlying ischemia or injury pattern.  Echocardiogram: 06/13/2020:    Poor echo window. Normal LV systolic function with EF 70%. Left ventricle cavity is normal in size. Mild concentric remodeling of the left  ventricle. Normal global wall motion. Doppler evidence of grade I (impaired) diastolic dysfunction, normal LAP. Calculated EF 70%.  Left atrial cavity is mildly dilated at 3.9 cm.  Right ventricle cavity is slightly dilated. Normal right ventricular function.  No significant change from 11/16/2018.  Stress Testing:  Leane Call Stress Test 12/27/2018: 1.  Resting EKG normal sinus rhythm, stress EKG nondiagnostic due to pharmacologic stress. 2.  Left ventricle is dilated both in rest and stress images, LV end-diastolic volume 147 mL. Perfusion images reveal a small sized mild to moderate amount of ischemia in the apical inferior and periapical region.  LVEF elevated at 47%.  This is an intermediate risk study. No previous exam available for comparison.  Heart Catheterization: 01/18/2019: Normal LV systolic function, normal LVEDP.  No pressure gradient across the aortic valve. RCA is dominant and has mild disease.  Circumflex coronary artery is moderate-sized vessel with mild disease. LAD is a large-caliber vessel, with high-grade 95% proximal stenosis followed by tandem 70 to 80% stenosis.  Moderate amount of calcification evident. Successful orbital atherectomy followed by stenting with 3.5 x 30 mm resolute [place] at 12 atmospheric pressure for 60 seconds followed by postdilatation with 3.5 x 18 mm [place] balloon at 16 atmospheric pressure x2.  Stenosis reduced to 0% with TIMI-3 to TIMI-3 flow.  Carotid duplex: [12/27/2013]: No evidence of hemodynamically significant stenosis in the bilateral carotid bifurcation vessels.  LABORATORY DATA:    Latest Ref Rng & Units  01/14/2019    1:31 PM 07/09/2018    4:57 AM 07/07/2018    2:30 AM  CBC  WBC 3.4 - 10.8 x10E3/uL 7.9  15.7  17.1   Hemoglobin 11.1 - 15.9 g/dL 13.1  13.3  12.9   Hematocrit 34.0 - 46.6 % 39.6  41.4  38.7   Platelets 150 - 450 x10E3/uL 282  334  318        Latest Ref Rng & Units 10/02/2021   10:06 AM 09/11/2021    9:20 AM 03/21/2021    8:22 AM  CMP  Glucose 70 - 99 mg/dL 141  203  161   BUN 8 - 27 mg/dL '27  24  27   '$ Creatinine 0.57 - 1.00 mg/dL 1.26  1.15  1.08   Sodium 134 - 144 mmol/L 140  141  144   Potassium 3.5 - 5.2 mmol/L 5.0  4.7  4.9   Chloride 96 - 106 mmol/L 98  99  95   CO2 20 - 29 mmol/L '25  26  26   '$ Calcium 8.7 - 10.3 mg/dL 10.2  10.0  9.9   Total Protein 6.0 - 8.5 g/dL  6.6  6.6   Total Bilirubin 0.0 - 1.2 mg/dL  0.3  0.4   Alkaline Phos 44 - 121 IU/L  44  62   AST 0 - 40 IU/L  14  18   ALT 0 - 32 IU/L  24  23     Lipid Panel  Lab Results  Component Value Date   CHOL 84 (L) 09/11/2021   HDL 39 (L) 09/11/2021   LDLCALC 13 09/11/2021   LDLDIRECT 15 09/11/2021   TRIG 204 (H) 09/11/2021   CHOLHDL 2.2 09/11/2021    Lab Results  Component Value Date   HGBA1C 7.4 (H) 10/27/2018   HGBA1C 6.7 (H) 02/01/2018   HGBA1C 6.7 (H) 10/06/2017   No components found for: "NTPROBNP" Lab Results  Component Value Date   TSH 1.960 03/21/2020   TSH 2.300 10/27/2018   TSH 3.010 04/29/2017    Cardiac Panel (last 3 results) No results for input(s): "CKTOTAL", "CKMB", "  TROPONINIHS", "RELINDX" in the last 72 hours.  External Labs: Collected: 02/06/2022 available in [facility] Every[facility] at [place]. Total cholesterol 92, triglycerides 274, HDL 32, LDL 29. Hemoglobin A1c 7.9 BUN 28, creatinine 1.22. eGFR 48. Sodium 138, potassium 4.8, chloride 98, bicarb 24. AST 16, ALT 15, alkaline phosphatase 38  IMPRESSION:    ICD-10-CM   1. Coronary artery disease involving native coronary artery of native heart without angina pectoris  I25.10 EKG 12-Lead    2. Status post  angioplasty with stent  Z95.820     3. Hypertriglyceridemia  E78.1     4. Essential hypertension  I10     5. Type 2 diabetes mellitus without complication, without long-term current use of insulin (HCC)  E11.9     6. Mixed hyperlipidemia  E78.2     7. Class 3 severe obesity with serious comorbidity and body mass index (BMI) of 40.0 to 44.9 in adult, unspecified obesity type [facility])  E66.01    Z68.41        RECOMMENDATIONS: TEENA MANGUS is a 72 y.o. female whose past medical history and cardiovascular risk factors include: Diabetes mellitus type 2, morbid obesity, obstructive sleep apnea on CPAP, mixed hyperlipidemia, chronic palpitations with history of PACs and PVCs, CAD status post PCI to the LAD 01/18/2019, postmenopausal female, advanced age.  Coronary artery disease involving native coronary artery of native heart without angina pectoris / status post angioplasty with stent Denies angina pectoris. EKG: Nonischemic. Prior ischemic workup reviewed. We emphasized importance of secondary prevention. No additional testing warranted at this time.  Hypertriglyceridemia 300 mg/dL back in August 2022.   Most recent triglyceride levels at 274 mg/dL despite being on Vascepa as well as fenofibrate. Educated her on importance of dietary restrictions of foods that are high in triglycerides and carbohydrates. Encouraged increasing physical activity to 30 minutes a day 5 days a week as tolerated.  Essential hypertension Office blood pressures are not well controlled. Home blood pressures are very well-controlled.   Based on ambulatory blood pressure readings average BP 129/63 with a pulse of 70. Jardiance were discontinued since last visit due to yeast infections.  Type 2 diabetes mellitus without complication, without long-term current use of insulin (HCC) Currently on ARB, statin, PCSK9 inhibitor. Did not tolerate Jardiance due to yeast infections. Reemphasized the importance of  glycemic control.  Mixed hyperlipidemia LDL within excellent control. Continue maximally tolerated statins along with PCSK9 inhibitors.  Class 3 severe obesity with serious comorbidity and body mass index (BMI) of 40.0 to 44.9 in adult, unspecified obesity type (HCC) Body mass index is 43.1 kg/m. I reviewed with the patient the importance of diet, regular physical activity/exercise, weight loss.   Patient is educated on increasing physical activity gradually as tolerated.  With the goal of moderate intensity exercise for 30 minutes a day 5 days a week.   FINAL MEDICATION LIST END OF ENCOUNTER: No orders of the defined types were placed in this encounter.     Current Outpatient Medications:    ACCU-CHEK AVIVA PLUS test strip, U UTD, Disp: , Rfl: 5   ACCU-CHEK SOFTCLIX LANCETS lancets, U UTD, Disp: , Rfl: 11   acetaminophen (TYLENOL) 500 MG tablet, Take 1,000-1,500 mg by mouth 2 (two) times daily as needed (pain)., Disp: , Rfl:    Alirocumab (PRALUENT) 150 MG/ML SOAJ, ADMINISTER 1 ML UNDER THE SKIN EVERY 14 DAYS, Disp: 2 mL, Rfl: 6   atorvastatin (LIPITOR) 80 MG tablet, Take 80 mg by mouth daily., Disp: ,  Rfl:    carvedilol (COREG) 12.5 MG tablet, TAKE 1 TABLET(12.5 MG) BY MOUTH TWICE DAILY, Disp: 180 tablet, Rfl: 0   cetirizine (ZYRTEC) 10 MG tablet, Take 10 mg by mouth at bedtime. , Disp: , Rfl:    chlorthalidone (HYGROTON) 25 MG tablet, Take 1 tablet (25 mg total) by mouth in the morning for 180 doses., Disp: 90 tablet, Rfl: 1   Cholecalciferol (VITAMIN D) 50 MCG (2000 UT) tablet, Take 2,000 Units by mouth daily., Disp: , Rfl:    clopidogrel (PLAVIX) 75 MG tablet, Take 75 mg by mouth daily., Disp: , Rfl:    escitalopram (LEXAPRO) 20 MG tablet, Take 20 mg by mouth every evening. , Disp: , Rfl:    fenofibrate 160 MG tablet, Take 1 tablet (160 mg total) by mouth daily., Disp: 90 tablet, Rfl: 1   fluticasone (FLONASE) 50 MCG/ACT nasal spray, Place 1 spray into both nostrils as needed.,  Disp: , Rfl:    gabapentin (NEURONTIN) 300 MG capsule, Take 300 mg by mouth at bedtime., Disp: , Rfl:    glimepiride (AMARYL) 4 MG tablet, Take 4 mg by mouth daily with breakfast. , Disp: , Rfl:    LEVEMIR 100 UNIT/ML injection, Inject 20 Units into the skin in the morning., Disp: , Rfl:    losartan (COZAAR) 100 MG tablet, Take 1 tablet (100 mg total) by mouth daily., Disp: 90 tablet, Rfl: 1   magnesium oxide (MAG-OX) 400 (240 Mg) MG tablet, Take 1 tablet (400 mg total) by mouth daily., Disp: 90 tablet, Rfl: 3   meloxicam (MOBIC) 15 MG tablet, Take 15 mg by mouth daily., Disp: , Rfl:    metFORMIN (GLUCOPHAGE) 1000 MG tablet, Take 1 tablet (1,000 mg total) by mouth 2 (two) times daily with a meal., Disp: 60 tablet, Rfl: 0   Multiple Vitamin (MULTIVITAMIN WITH MINERALS) TABS tablet, Take 1 tablet by mouth daily., Disp: , Rfl:    nitroGLYCERIN (NITROSTAT) 0.4 MG SL tablet, PLACE 1 TABLET UNDER THE TONGUE EVERY 5 MINUTES AS NEEDED FOR CHEST PAIN, Disp: 25 tablet, Rfl: 3   pantoprazole (PROTONIX) 40 MG tablet, Take 1 tablet by mouth daily., Disp: , Rfl:    spironolactone (ALDACTONE) 25 MG tablet, TAKE 1 TABLET(25 MG) BY MOUTH EVERY MORNING, Disp: 90 tablet, Rfl: 0   TRULICITY 4.5 KJ/1.7HX SOPN, SMARTSIG:4.5 Milligram(s) SUB-Q Once a Week, Disp: , Rfl:    VASCEPA 1 g capsule, TAKE 2 CAPSULES(2 GRAMS) BY MOUTH TWICE DAILY, Disp: 120 capsule, Rfl: 5  Orders Placed This Encounter  Procedures   EKG 12-Lead    --Continue cardiac medications as reconciled in final medication list. --Return in about 6 months (around 09/18/2022) for Follow up, CAD. Or sooner if needed. --Continue follow-up with your primary care physician regarding the management of your other chronic comorbid conditions.  Patient's questions and concerns were addressed to her satisfaction. She voices understanding of the instructions provided during this encounter.   This note was created using a voice recognition software as a result  there may be grammatical errors inadvertently enclosed that do not reflect the nature of this encounter. Every attempt is made to correct such errors.  Rex Kras, [place], [facility]  Pager: [phone] Office: [phone]

## 2022-04-06 ENCOUNTER — Other Ambulatory Visit: Payer: Self-pay | Admitting: Cardiology

## 2022-04-06 DIAGNOSIS — I10 Essential (primary) hypertension: Secondary | ICD-10-CM

## 2022-04-07 ENCOUNTER — Ambulatory Visit: Payer: Medicare Other | Admitting: Cardiology

## 2022-04-08 ENCOUNTER — Other Ambulatory Visit: Payer: Self-pay | Admitting: Cardiology

## 2022-04-08 DIAGNOSIS — I10 Essential (primary) hypertension: Secondary | ICD-10-CM

## 2022-04-17 ENCOUNTER — Other Ambulatory Visit: Payer: Self-pay

## 2022-04-17 ENCOUNTER — Encounter: Payer: Self-pay | Admitting: Cardiology

## 2022-04-17 DIAGNOSIS — Z95820 Peripheral vascular angioplasty status with implants and grafts: Secondary | ICD-10-CM

## 2022-04-17 DIAGNOSIS — I2510 Atherosclerotic heart disease of native coronary artery without angina pectoris: Secondary | ICD-10-CM

## 2022-04-17 MED ORDER — VASCEPA 1 G PO CAPS: ORAL_CAPSULE | ORAL | 5 refills | Status: DC

## 2022-05-15 ENCOUNTER — Other Ambulatory Visit: Payer: Self-pay

## 2022-05-15 MED ORDER — PRALUENT 150 MG/ML ~~LOC~~ SOAJ: SUBCUTANEOUS | 6 refills | Status: DC

## 2022-05-22 ENCOUNTER — Other Ambulatory Visit: Payer: Self-pay | Admitting: Cardiology

## 2022-05-22 DIAGNOSIS — I10 Essential (primary) hypertension: Secondary | ICD-10-CM

## 2022-07-06 ENCOUNTER — Other Ambulatory Visit: Payer: Self-pay | Admitting: Cardiology

## 2022-07-06 DIAGNOSIS — I10 Essential (primary) hypertension: Secondary | ICD-10-CM

## 2022-08-15 ENCOUNTER — Other Ambulatory Visit: Payer: Self-pay | Admitting: Cardiology

## 2022-08-15 DIAGNOSIS — I10 Essential (primary) hypertension: Secondary | ICD-10-CM

## 2022-09-17 ENCOUNTER — Ambulatory Visit: Payer: Medicare Other | Admitting: Cardiology

## 2022-09-17 ENCOUNTER — Encounter: Payer: Self-pay | Admitting: Cardiology

## 2022-09-17 VITALS — BP 124/67 | HR 73 | Resp 16 | Ht 63.0 in | Wt 234.0 lb

## 2022-09-17 DIAGNOSIS — E119 Type 2 diabetes mellitus without complications: Secondary | ICD-10-CM

## 2022-09-17 DIAGNOSIS — E781 Pure hyperglyceridemia: Secondary | ICD-10-CM

## 2022-09-17 DIAGNOSIS — Z95820 Peripheral vascular angioplasty status with implants and grafts: Secondary | ICD-10-CM

## 2022-09-17 DIAGNOSIS — I10 Essential (primary) hypertension: Secondary | ICD-10-CM

## 2022-09-17 DIAGNOSIS — I2510 Atherosclerotic heart disease of native coronary artery without angina pectoris: Secondary | ICD-10-CM

## 2022-09-17 DIAGNOSIS — E782 Mixed hyperlipidemia: Secondary | ICD-10-CM

## 2022-09-17 MED ORDER — SPIRONOLACTONE 25 MG PO TABS
25.0000 mg | ORAL_TABLET | Freq: Every day | ORAL | 1 refills | Status: DC
Start: 2022-09-17 — End: 2023-01-14

## 2022-09-17 NOTE — Progress Notes (Signed)
[name] [name] Date of Birth: 1950-11-28 MRN: [id number] Primary Care Provider:McClanahan, Bella Kennedy, NP Former Cardiology Providers: Altamese [place], APRN, FNP-C Primary Cardiologist: Tessa Lerner, DO, [facility] (established care 11/03/2019)  Date: 09/17/22 Last Office Visit: March 20, 2022  Chief Complaint  Patient presents with   Coronary artery disease involving native coronary artery of   Follow-up    HPI  [name] [name] is a 72 y.o.  female whose past medical history and cardiovascular risk factors include: Diabetes mellitus type 2, morbid obesity, obstructive sleep apnea on CPAP, mixed hyperlipidemia, hypertriglyceridemia, chronic palpitations with history of PACs and PVCs, CAD status post PCI to the LAD 01/18/2019, postmenopausal female, advanced age.  Patient is being followed by the practice given her CAD status post PCI to the LAD.  Reviewed working on improving her modifiable cardiovascular risk factors.  She presents today for 7-month follow-up visit.  She denies anginal chest pain or heart failure symptoms.  No hospitalizations or urgent care visits for cardiovascular reasons since last office encounter.  With improvement in lifestyle and initiation of Mounjaro she has lost approximately 13 pounds over the last 6 months.  She is congratulated on her weight loss journey.  This has resulted in significant improvement in her lipids.  With losing weight patient states that she is more active, enjoys gardening, can bend forward without knee pain.  FUNCTIONAL STATUS: No structured exercise program or daily routine.   ALLERGIES: Allergies  Allergen Reactions   Sglt2 Inhibitors Rash    Extensive candidiasis   Codeine Palpitations and Other (See Comments)   MEDICATION LIST PRIOR TO VISIT: Current Outpatient Medications on File Prior to Visit  Medication Sig Dispense Refill   ACCU-CHEK AVIVA PLUS test strip U UTD  5   ACCU-CHEK SOFTCLIX LANCETS lancets U UTD  11    acetaminophen (TYLENOL) 500 MG tablet Take 1,000-1,500 mg by mouth 2 (two) times daily as needed (pain).     Alirocumab (PRALUENT) 150 MG/ML SOAJ ADMINISTER 1 ML UNDER THE SKIN EVERY 14 DAYS 2 mL 6   atorvastatin (LIPITOR) 80 MG tablet Take 80 mg by mouth daily.     carvedilol (COREG) 12.5 MG tablet TAKE 1 TABLET(12.5 MG) BY MOUTH TWICE DAILY 180 tablet 0   cetirizine (ZYRTEC) 10 MG tablet Take 10 mg by mouth at bedtime.      chlorthalidone (HYGROTON) 25 MG tablet Take 1 tablet (25 mg total) by mouth in the morning for 180 doses. 90 tablet 1   Cholecalciferol (VITAMIN D) 50 MCG (2000 UT) tablet Take 2,000 Units by mouth daily.     clopidogrel (PLAVIX) 75 MG tablet Take 75 mg by mouth daily.     escitalopram (LEXAPRO) 20 MG tablet Take 20 mg by mouth every evening.      fluticasone (FLONASE) 50 MCG/ACT nasal spray Place 1 spray into both nostrils as needed.     gabapentin (NEURONTIN) 300 MG capsule Take 300 mg by mouth at bedtime.     losartan (COZAAR) 100 MG tablet Take 1 tablet (100 mg total) by mouth daily. 90 tablet 1   meloxicam (MOBIC) 15 MG tablet Take 15 mg by mouth daily.     metFORMIN (GLUCOPHAGE) 1000 MG tablet Take 1 tablet (1,000 mg total) by mouth 2 (two) times daily with a meal. 60 tablet 0   Multiple Vitamin (MULTIVITAMIN WITH MINERALS) TABS tablet Take 1 tablet by mouth daily.     nitroGLYCERIN (NITROSTAT) 0.4 MG SL tablet PLACE 1 TABLET UNDER THE TONGUE EVERY  5 MINUTES AS NEEDED FOR CHEST PAIN 25 tablet 3   pantoprazole (PROTONIX) 40 MG tablet Take 1 tablet by mouth daily.     Tirzepatide [facility]) Inject into the skin.     VASCEPA 1 g capsule TAKE 2 CAPSULES(2 GRAMS) BY MOUTH TWICE DAILY 120 capsule 5   fenofibrate 160 MG tablet Take 1 tablet (160 mg total) by mouth daily. 90 tablet 1   magnesium oxide (MAG-OX) 400 (240 Mg) MG tablet Take 1 tablet (400 mg total) by mouth daily. (Patient not taking: Reported on 09/17/2022) 90 tablet 3   No current facility-administered  medications on file prior to visit.    PAST MEDICAL HISTORY: Past Medical History:  Diagnosis Date   Anxiety    Carpal tunnel syndrome of right wrist    Coronary artery disease    per cath 11-29-2013  mild diffuse CAD and calcification in proximal LAD   Depression    Diverticulosis    Essential hypertension    GERD (gastroesophageal reflux disease)    Heart disease    History of endometrial cancer 05/2003   FIGO, Grade 1  s/p  TAG w/ BSO   HTN (hypertension)    Hyperlipidemia    OA (osteoarthritis)    OSA on CPAP    followed by dr Vassie Loll-- per study 03-26-2012  mod. to severe OSA , AHI 37/hr   Type II diabetes mellitus (HCC)    followed by pcp   Urinary incontinence in female    Vitamin D deficiency     PAST SURGICAL HISTORY: Past Surgical History:  Procedure Laterality Date   CARDIAC CATHETERIZATION  11/29/2013   dr Jacinto Halim   mild diffuse noncritial CAD and mild calcification involving the proximal LAD,  LVEF 55-60%   CARPAL TUNNEL RELEASE Right 07/17/2017   Procedure: CARPAL TUNNEL RELEASE;  Surgeon: Jodi Geralds, MD;  Location[facility];  Service: Orthopedics;  Laterality: Right;   CHOLECYSTECTOMY     COLONOSCOPY WITH PROPOFOL N/A 01/31/2020   Procedure: COLONOSCOPY WITH PROPOFOL;  Surgeon: Charna Elizabeth, MD;  Location: [facility];  Service: Endoscopy;  Laterality: N/A;   CORONARY ATHERECTOMY N/A 01/18/2019   Procedure: CORONARY ATHERECTOMY;  Surgeon: Yates Decamp, MD;  Location: [facility] INVASIVE CV LAB;  Service: Cardiovascular;  Laterality: N/A;   CORONARY STENT INTERVENTION N/A 01/18/2019   Procedure: CORONARY STENT INTERVENTION;  Surgeon: Yates Decamp, MD;  Location: [facility] INVASIVE CV LAB;  Service: Cardiovascular;  Laterality: N/A;   KNEE ARTHROSCOPY Left 10/ 2004;  05-17-2008   dr graves   KNEE ARTHROSCOPY Right 11/2012   LAPAROSCOPIC CHOLECYSTECTOMY  11-19-2006  dr Abbey Chatters  [facility]   LEFT HEART CATH AND CORONARY ANGIOGRAPHY N/A 01/18/2019   Procedure: LEFT  HEART CATH AND CORONARY ANGIOGRAPHY;  Surgeon: Yates Decamp, MD;  Location: [facility] INVASIVE CV LAB;  Service: Cardiovascular;  Laterality: N/A;   LEFT HEART CATHETERIZATION WITH CORONARY ANGIOGRAM N/A 11/29/2013   Procedure: LEFT HEART CATHETERIZATION WITH CORONARY ANGIOGRAM;  Surgeon: Pamella Pert, MD;  Location: [facility] CATH LAB;  Service: Cardiovascular;  Laterality: N/A;   TOTAL ABDOMINAL HYSTERECTOMY W/ BILATERAL SALPINGOOPHORECTOMY  06-20-2003    dr Maggie Schwalbe  [facility]   endometrial cancer   TOTAL HIP ARTHROPLASTY Right 09/02/2013   Procedure: RIGHT TOTAL HIP ARTHROPLASTY ANTERIOR APPROACH;  Surgeon: Harvie Junior, MD;  Location: [facility] OR;  Service: Orthopedics;  Laterality: Right;   TOTAL KNEE ARTHROPLASTY Left 08/02/2012   Procedure: TOTAL KNEE ARTHROPLASTY;  Surgeon: Harvie Junior, MD;  Location:  [facility] OR;  Service: Orthopedics;  Laterality: Left;  left total knee arthroplasty   TUBAL LIGATION  yrs ago    FAMILY HISTORY: The patient's family history includes Breast cancer in her sister; Cancer in her sister; Diabetes in her brother, father, mother, and sister; Diabetes (age of onset: 110) in her sister; Diabetes type II in her father; Heart disease (age of onset: 19) in her brother and father; Heart disease (age of onset: 35) in her sister; High Cholesterol in her father and mother; Hypertension in her brother, father, mother, and sister; Lung cancer (age of onset: 72) in her father; Lupus in an other family member; Obesity in her mother; Pancreatic cancer in an other family member; Sudden death in her mother; Thyroid disease in her mother and sister.   SOCIAL HISTORY:  The patient  reports that she has never smoked. She has never used smokeless tobacco. She reports that she does not drink alcohol and does not use drugs.  Review of Systems  Constitutional: Negative for chills, fever and malaise/fatigue.  HENT:  Negative for hoarse voice and nosebleeds.   Eyes:  Negative for discharge, double vision and pain.   Cardiovascular:  Negative for chest pain, claudication, dyspnea on exertion, leg swelling, near-syncope, orthopnea, palpitations, paroxysmal nocturnal dyspnea and syncope.  Respiratory:  Negative for hemoptysis and shortness of breath.   Musculoskeletal:  Negative for muscle cramps and myalgias.  Gastrointestinal:  Negative for abdominal pain, constipation, diarrhea, hematemesis, hematochezia, melena, nausea and vomiting.  Neurological:  Negative for dizziness and light-headedness.    PHYSICAL EXAM:    09/17/2022    9:49 AM 03/20/2022    2:51 PM 03/20/2022    1:50 PM  Vitals with BMI  Height 5\' 3"   5' 3.5"  Weight 234 lbs  247 lbs 3 oz  BMI 41.46  43.1  Systolic 124 130 865  Diastolic 67 78 82  Pulse 73 80 73    Physical Exam  Constitutional: No distress.  Age appropriate, hemodynamically stable.   Neck: No JVD present.  Cardiovascular: Normal rate, regular rhythm, S1 normal, S2 normal, intact distal pulses and normal pulses. Exam reveals no gallop, no S3 and no S4.  No murmur heard. Pulmonary/Chest: Effort normal and breath sounds normal. No stridor. She has no wheezes. She has no rales.  Abdominal: Soft. Bowel sounds are normal. She exhibits no distension. There is no abdominal tenderness.  Musculoskeletal:        General: No edema.     Cervical back: Neck supple.  Neurological: She is alert and oriented to person, place, and time. She has intact cranial nerves (2-12).  Skin: Skin is warm and moist.   CARDIAC DATABASE: EKG: 03/20/2022: Sinus rhythm, 72 bpm, without underlying ischemia or injury pattern.  Echocardiogram: 06/13/2020:    Poor echo window. Normal LV systolic function with EF 70%. Left ventricle cavity is normal in size. Mild concentric remodeling of the left  ventricle. Normal global wall motion. Doppler evidence of grade I (impaired) diastolic dysfunction, normal LAP. Calculated EF 70%.  Left atrial cavity is mildly dilated at 3.9 cm.  Right ventricle cavity  is slightly dilated. Normal right ventricular function.  No significant change from 11/16/2018.  Stress Testing:  Steffanie Dunn Stress Test 12/27/2018: 1.  Resting EKG normal sinus rhythm, stress EKG nondiagnostic due to pharmacologic stress. 2.  Left ventricle is dilated both in rest and stress images, LV end-diastolic volume 146 mL. Perfusion images reveal a small sized mild to moderate amount of  ischemia in the apical inferior and periapical region.  LVEF elevated at 47%.  This is an intermediate risk study. No previous exam available for comparison.  Heart Catheterization: 01/18/2019: Normal LV systolic function, normal LVEDP.  No pressure gradient across the aortic valve. RCA is dominant and has mild disease.  Circumflex coronary artery is moderate-sized vessel with mild disease. LAD is a large-caliber vessel, with high-grade 95% proximal stenosis followed by tandem 70 to 80% stenosis.  Moderate amount of calcification evident. Successful orbital atherectomy followed by stenting with 3.5 x 30 mm resolute Onyx at 12 atmospheric pressure for 60 seconds followed by postdilatation with 3.5 x 18 mm Galloway balloon at 16 atmospheric pressure x2.  Stenosis reduced to 0% with TIMI-3 to TIMI-3 flow.  Carotid duplex: [12/27/2013]: No evidence of hemodynamically significant stenosis in the bilateral carotid bifurcation vessels.  LABORATORY DATA:    Latest Ref Rng & Units 01/14/2019    1:31 PM 07/09/2018    4:57 AM 07/07/2018    2:30 AM  CBC  WBC 3.4 - 10.8 x10E3/uL 7.9  15.7  17.1   Hemoglobin 11.1 - 15.9 g/dL 95.6  21.3  08.6   Hematocrit 34.0 - 46.6 % 39.6  41.4  38.7   Platelets 150 - 450 x10E3/uL 282  334  318        Latest Ref Rng & Units 10/02/2021   10:06 AM 09/11/2021    9:20 AM 03/21/2021    8:22 AM  CMP  Glucose 70 - 99 mg/dL 578  469  629   BUN 8 - 27 mg/dL 27  24  27    Creatinine 0.57 - 1.00 mg/dL 5.28  4.13  2.44   Sodium 134 - 144 mmol/L 140  141  144   Potassium 3.5 - 5.2  mmol/L 5.0  4.7  4.9   Chloride 96 - 106 mmol/L 98  99  95   CO2 20 - 29 mmol/L 25  26  26    Calcium 8.7 - 10.3 mg/dL 01.0  27.2  9.9   Total Protein 6.0 - 8.5 g/dL  6.6  6.6   Total Bilirubin 0.0 - 1.2 mg/dL  0.3  0.4   Alkaline Phos 44 - 121 IU/L  44  62   AST 0 - 40 IU/L  14  18   ALT 0 - 32 IU/L  24  23     Lipid Panel  Lab Results  Component Value Date   CHOL 84 (L) 09/11/2021   HDL 39 (L) 09/11/2021   LDLCALC 13 09/11/2021   LDLDIRECT 15 09/11/2021   TRIG 204 (H) 09/11/2021   CHOLHDL 2.2 09/11/2021    Lab Results  Component Value Date   HGBA1C 7.4 (H) 10/27/2018   HGBA1C 6.7 (H) 02/01/2018   HGBA1C 6.7 (H) 10/06/2017   No components found for: "NTPROBNP" Lab Results  Component Value Date   TSH 1.960 03/21/2020   TSH 2.300 10/27/2018   TSH 3.010 04/29/2017    Cardiac Panel (last 3 results) No results for input(s): "CKTOTAL", "CKMB", "TROPONINIHS", "RELINDX" in the last 72 hours.  External Labs: Collected: 02/06/2022 available in Care Everywhere at [facility]. Total cholesterol 92, triglycerides 274, HDL 32, LDL 29. Hemoglobin A1c 7.9 BUN 28, creatinine 1.22. eGFR 48. Sodium 138, potassium 4.8, chloride 98, bicarb 24. AST 16, ALT 15, alkaline phosphatase 38  External Labs: Collected: August 18, 2022 available in Care Everywhere. Total cholesterol 95, triglycerides 188, HDL 37, LDL 28 A1c 6.7 Hemoglobin 11.2.  Hematocrit 34.9%. BUN 28, creatinine 1.25. Sodium 140, potassium 4.5, chloride 102, bicarb 24. AST 19, ALT 24, alkaline phosphatase 32  IMPRESSION:    ICD-10-CM   1. Coronary artery disease involving native coronary artery of native heart without angina pectoris  I25.10     2. Status post angioplasty with stent  Z95.820     3. Hypertriglyceridemia  E78.1     4. Essential hypertension  I10 spironolactone (ALDACTONE) 25 MG tablet    5. Type 2 diabetes mellitus without complication, without long-term current use of insulin (HCC)  E11.9      6. Mixed hyperlipidemia  E78.2     7. Class 3 severe obesity due to excess calories with serious comorbidity and body mass index (BMI) of 40.0 to 44.9 in adult [facility])  E66.01    Z68.41         RECOMMENDATIONS: [name] [name] is a 72 y.o. female whose past medical history and cardiovascular risk factors include: Diabetes mellitus type 2, morbid obesity, obstructive sleep apnea on CPAP, mixed hyperlipidemia, chronic palpitations with history of PACs and PVCs, CAD status post PCI to the LAD 01/18/2019, postmenopausal female, advanced age.  Coronary artery disease involving native coronary artery of native heart without angina pectoris Status post angioplasty with stent Denies anginal chest pain. Reviewed prior ischemic workup as part of medical decision making at today's office visit. Has lost 13 pounds with being on Mounjaro for 2 months.  Physical activity is slowly improving. Reemphasized the importance of secondary prevention with focus on improving her modifiable cardiovascular risk factors such as glycemic control, lipid management, blood pressure control, weight loss. Outside labs from Care Everywhere independently reviewed and noted above  Hypertriglyceridemia TAG 300 mg/dL back in August 1610. TAG 188 mg/dL as of June 2024 Currently on Mounjaro, Vascepa, fenofibrate Educated her on importance of dietary restrictions of foods that are high in triglycerides and carbohydrates. Encouraged increasing physical activity to 30 minutes a day 5 days a week as tolerated.  Essential hypertension Home and office blood pressures are well-controlled. Refilled spironolactone.  Type 2 diabetes mellitus without complication, without long-term current use of insulin (HCC) Currently on ARB, statin, PCSK9 inhibitor. Did not tolerate Jardiance due to yeast infections. Reemphasized the importance of glycemic control.  Mixed hyperlipidemia LDL within excellent control. Continue maximally  tolerated statins along with PCSK9 inhibitors.  Class 3 severe obesity with serious comorbidity and body mass index (BMI) of 40.0 to 44.9 in adult, unspecified obesity type (HCC) Body mass index is 41.45 kg/m. Has lost 13 pounds since last office visit. Has been on Mounjaro over the last 2 months. I reviewed with the patient the importance of diet, regular physical activity/exercise, weight loss.   Patient is educated on increasing physical activity gradually as tolerated.  With the goal of moderate intensity exercise for 30 minutes a day 5 days a week.   FINAL MEDICATION LIST END OF ENCOUNTER: Meds ordered this encounter  Medications   spironolactone (ALDACTONE) 25 MG tablet    Sig: Take 1 tablet (25 mg total) by mouth daily.    Dispense:  90 tablet    Refill:  1      Current Outpatient Medications:    ACCU-CHEK AVIVA PLUS test strip, U UTD, Disp: , Rfl: 5   ACCU-CHEK SOFTCLIX LANCETS lancets, U UTD, Disp: , Rfl: 11   acetaminophen (TYLENOL) 500 MG tablet, Take 1,000-1,500 mg by mouth 2 (two) times daily as needed (pain)., Disp: , Rfl:  Alirocumab (PRALUENT) 150 MG/ML SOAJ, ADMINISTER 1 ML UNDER THE SKIN EVERY 14 DAYS, Disp: 2 mL, Rfl: 6   atorvastatin (LIPITOR) 80 MG tablet, Take 80 mg by mouth daily., Disp: , Rfl:    carvedilol (COREG) 12.5 MG tablet, TAKE 1 TABLET(12.5 MG) BY MOUTH TWICE DAILY, Disp: 180 tablet, Rfl: 0   cetirizine (ZYRTEC) 10 MG tablet, Take 10 mg by mouth at bedtime. , Disp: , Rfl:    chlorthalidone (HYGROTON) 25 MG tablet, Take 1 tablet (25 mg total) by mouth in the morning for 180 doses., Disp: 90 tablet, Rfl: 1   Cholecalciferol (VITAMIN D) 50 MCG (2000 UT) tablet, Take 2,000 Units by mouth daily., Disp: , Rfl:    clopidogrel (PLAVIX) 75 MG tablet, Take 75 mg by mouth daily., Disp: , Rfl:    escitalopram (LEXAPRO) 20 MG tablet, Take 20 mg by mouth every evening. , Disp: , Rfl:    fluticasone (FLONASE) 50 MCG/ACT nasal spray, Place 1 spray into both  nostrils as needed., Disp: , Rfl:    gabapentin (NEURONTIN) 300 MG capsule, Take 300 mg by mouth at bedtime., Disp: , Rfl:    losartan (COZAAR) 100 MG tablet, Take 1 tablet (100 mg total) by mouth daily., Disp: 90 tablet, Rfl: 1   meloxicam (MOBIC) 15 MG tablet, Take 15 mg by mouth daily., Disp: , Rfl:    metFORMIN (GLUCOPHAGE) 1000 MG tablet, Take 1 tablet (1,000 mg total) by mouth 2 (two) times daily with a meal., Disp: 60 tablet, Rfl: 0   Multiple Vitamin (MULTIVITAMIN WITH MINERALS) TABS tablet, Take 1 tablet by mouth daily., Disp: , Rfl:    nitroGLYCERIN (NITROSTAT) 0.4 MG SL tablet, PLACE 1 TABLET UNDER THE TONGUE EVERY 5 MINUTES AS NEEDED FOR CHEST PAIN, Disp: 25 tablet, Rfl: 3   pantoprazole (PROTONIX) 40 MG tablet, Take 1 tablet by mouth daily., Disp: , Rfl:    Tirzepatide (MOUNJARO [place]), Inject into the skin., Disp: , Rfl:    VASCEPA 1 g capsule, TAKE 2 CAPSULES(2 GRAMS) BY MOUTH TWICE DAILY, Disp: 120 capsule, Rfl: 5   fenofibrate 160 MG tablet, Take 1 tablet (160 mg total) by mouth daily., Disp: 90 tablet, Rfl: 1   magnesium oxide (MAG-OX) 400 (240 Mg) MG tablet, Take 1 tablet (400 mg total) by mouth daily. (Patient not taking: Reported on 09/17/2022), Disp: 90 tablet, Rfl: 3   spironolactone (ALDACTONE) 25 MG tablet, Take 1 tablet (25 mg total) by mouth daily., Disp: 90 tablet, Rfl: 1  No orders of the defined types were placed in this encounter.   --Continue cardiac medications as reconciled in final medication list. --Return in about 6 months (around 03/20/2023) for Follow up, CAD. Or sooner if needed. --Continue follow-up with your primary care physician regarding the management of your other chronic comorbid conditions.  Patient's questions and concerns were addressed to her satisfaction. She voices understanding of the instructions provided during this encounter.   This note was created using a voice recognition software as a result there may be grammatical errors inadvertently  enclosed that do not reflect the nature of this encounter. Every attempt is made to correct such errors.  Tessa Lerner, [place], [facility]  Pager:  [phone] Office: [phone]

## 2022-09-18 ENCOUNTER — Ambulatory Visit: Payer: Medicare Other | Admitting: Cardiology

## 2022-10-17 ENCOUNTER — Encounter: Payer: Self-pay | Admitting: Cardiology

## 2022-10-17 DIAGNOSIS — I10 Essential (primary) hypertension: Secondary | ICD-10-CM

## 2022-10-17 NOTE — Telephone Encounter (Signed)
Yes, hold the BP meds for today and if her BP is still less than reduce Chlorthalidone (HYGROTON) to 12.5 MG tablet every day.   Hubbard Seldon Brooks, DO, [facility]

## 2022-10-17 NOTE — Telephone Encounter (Signed)
From patient.

## 2022-10-21 NOTE — Progress Notes (Signed)
No action done

## 2022-10-22 NOTE — Telephone Encounter (Signed)
From patient.

## 2022-10-31 ENCOUNTER — Other Ambulatory Visit: Payer: Self-pay | Admitting: Cardiology

## 2022-10-31 DIAGNOSIS — Z95820 Peripheral vascular angioplasty status with implants and grafts: Secondary | ICD-10-CM

## 2022-10-31 DIAGNOSIS — I2510 Atherosclerotic heart disease of native coronary artery without angina pectoris: Secondary | ICD-10-CM

## 2022-10-31 MED ORDER — LOSARTAN POTASSIUM 50 MG PO TABS: 50.0000 mg | ORAL_TABLET | Freq: Every day | ORAL | Status: AC

## 2022-10-31 NOTE — Telephone Encounter (Signed)
ON-CALL CARDIOLOGY 10/31/22  Patient's name: [name] [name].   MRN: [id number].    DOB: 01-Sep-1950 Primary care provider: Elder Negus, NP. Primary cardiologist: Tessa Lerner, DO, [facility]  Interaction regarding this patient's care today: Patient has been having episodes of hypotension and orthostasis. Currently takes carvedilol twice daily, losartan 100 mg p.o. every afternoon, and spironolactone 25 mg p.o. every morning She is already stopped chlorthalidone.  Patient has lost approximately 14 pounds in the last office visit.   Impression:   ICD-10-CM   1. Essential hypertension  I10 losartan (COZAAR) 50 MG tablet      Meds ordered this encounter  Medications   losartan (COZAAR) 50 MG tablet    Sig: Take 1 tablet (50 mg total) by mouth daily at 10 pm.    No orders of the defined types were placed in this encounter.   Recommendations: Benign essential hypertension: Currently being managed by PCP; however, requesting assistance. She has already stopped chlorthalidone. Recommended reducing losartan to 50 mg p.o. every afternoon. She has an office appointment with PCP in the coming weeks and will discuss further.  Telephone encounter total time: 10 minutes  Delilah Shan [facility]  Pager:  [phone] Office: [phone]

## 2022-11-11 ENCOUNTER — Other Ambulatory Visit: Payer: Self-pay | Admitting: Cardiology

## 2022-11-11 DIAGNOSIS — I10 Essential (primary) hypertension: Secondary | ICD-10-CM

## 2022-11-28 ENCOUNTER — Other Ambulatory Visit: Payer: Self-pay | Admitting: Cardiology

## 2023-01-14 ENCOUNTER — Ambulatory Visit (HOSPITAL_BASED_OUTPATIENT_CLINIC_OR_DEPARTMENT_OTHER): Payer: 59 | Admitting: Pulmonary Disease

## 2023-01-14 ENCOUNTER — Encounter (HOSPITAL_BASED_OUTPATIENT_CLINIC_OR_DEPARTMENT_OTHER): Payer: Self-pay | Admitting: Pulmonary Disease

## 2023-01-14 VITALS — BP 104/58 | HR 69 | Resp 14 | Ht 63.0 in | Wt 219.0 lb

## 2023-01-14 DIAGNOSIS — U099 Post covid-19 condition, unspecified: Secondary | ICD-10-CM | POA: Diagnosis not present

## 2023-01-14 DIAGNOSIS — G4733 Obstructive sleep apnea (adult) (pediatric): Secondary | ICD-10-CM | POA: Diagnosis not present

## 2023-01-14 NOTE — Progress Notes (Signed)
Subjective:    Patient ID: [name] [name], female    DOB: 08-21-1950, 72 y.o.   MRN: [id number]  HPI  72 yo diabetic, hypertensive never smoker followed for obstructive sleep apnea  -diagnosed in 2014.  - on auto CPAP 12 to 16 cm with a fullface mask      PMH - COVID-19 pneumonia with acute respiratory failure in May 2020 12/2018 cardiac stent -diabetes type 2  Discussed the use of AI scribe software for clinical note transcription with the patient, who gave verbal consent to proceed.  History of Present Illness   The patient, [name] [name], presents with a history of diabetes, hypertension, and sleep apnea. She reports significant weight loss, from 253 to 219 pounds, over the past year. This weight loss was achieved through dietary changes and the initiation of Mounjaro for diabetes management. The patient has been able to discontinue some of his antihypertensive and antidiabetic medications due to this weight loss.  Despite the weight loss, the patient reports persistent "brain fog," which she describes as a weird sensation. She is unsure if this will ever resolve. He also reports residual fatigue and occasional grogginess throughout the day, which she attributes to his recovery from a previous COVID-19 infection.  The patient uses a CPAP machine for sleep apnea, which she reports has been effective. She uses the machine for at least eight hours every night and even during daytime naps. The pressure on the machine has been reduced due to the patient's weight loss. However, she recently had to change his mask due to issues with the seal.  The patient has received his flu shot and is considering getting the RSV vaccine.She reports no current issues with swelling in his feet.        Significant tests/ events reviewed   NPSG 02/2012:  AHI 37/hr, desat to 78% Auto 2014:  Optimal pressure 16cm. 12/2012:  Decrease cpap to 14cm (10 pound weight loss).   05/2016 >>PAP titration  16/12 cm H2O    Review of Systems neg for any significant sore throat, dysphagia, itching, sneezing, nasal congestion or excess/ purulent secretions, fever, chills, sweats, unintended wt loss, pleuritic or exertional cp, hempoptysis, orthopnea pnd or change in chronic leg swelling. Also denies presyncope, palpitations, heartburn, abdominal pain, nausea, vomiting, diarrhea or change in bowel or urinary habits, dysuria,hematuria, rash, arthralgias, visual complaints, headache, numbness weakness or ataxia.     Objective:   Physical Exam  Gen. Pleasant, obese, in no distress ENT - no lesions, no post nasal drip Neck: No JVD, no thyromegaly, no carotid bruits Lungs: no use of accessory muscles, no dullness to percussion, decreased without rales or rhonchi  Cardiovascular: Rhythm regular, heart sounds  normal, no murmurs or gallops, no peripheral edema Musculoskeletal: No deformities, no cyanosis or clubbing , no tremors       Assessment & Plan:    Results   DIAGNOSTIC CPAP usage report: Pressure set between 12 and 16 cm H2O, average pressure needed 13 cm H2O, good seal except last few days, events well controlled       Assessment and Plan    Obstructive Sleep Apnea Well controlled with CPAP. Noted decreased pressure requirement likely secondary to significant weight loss. Recent issues with mask seal due to wear. -Continue CPAP use. -Replace mask as needed to maintain good seal.  Weight Loss Significant weight loss noted (from 253 to 219 lbs). Patient reports feeling well and has been able to decrease antihypertensive and antidiabetic medications. -Continue current  diet and exercise regimen.  Post-COVID Syndrome Reports of fatigue and grogginess, despite adequate sleep. -Monitor symptoms.  General Health Maintenance Up to date on flu vaccine. Discussed new RSV vaccine. -Administer RSV vaccine.

## 2023-01-14 NOTE — Patient Instructions (Signed)
VISIT SUMMARY:  Today, we discussed your ongoing management of diabetes, hypertension, and sleep apnea. You have successfully lost a significant amount of weight, which has allowed you to reduce some of your medications. We also addressed your concerns about persistent brain fog and fatigue, which you attribute to your recovery from a previous COVID-19 infection. Your use of the CPAP machine for sleep apnea continues to be effective, although you recently had to change your mask due to issues with the seal. Additionally, we reviewed your vaccination status and discussed the new RSV vaccine.  YOUR PLAN:  -OBSTRUCTIVE SLEEP APNEA: Obstructive sleep apnea is a condition where your breathing stops and starts during sleep due to blocked airways. Your condition is well controlled with the CPAP machine, and the pressure requirement has decreased likely due to your weight loss. Continue using the CPAP machine and replace the mask as needed to maintain a good seal.  -WEIGHT LOSS: You have lost a significant amount of weight, which has positively impacted your health, allowing you to reduce some of your medications. Continue with your current diet and exercise regimen to maintain this progress.  -POST-COVID SYNDROME: Post-COVID syndrome includes lingering symptoms like fatigue and grogginess after recovering from COVID-19. We will continue to monitor your symptoms to see if they improve over time.  -GENERAL HEALTH MAINTENANCE: You are up to date on your flu vaccine, and we discussed the new RSV vaccine. We recommend that you receive the RSV vaccine to further protect your health.  INSTRUCTIONS:  Please continue using your CPAP machine and replace the mask as needed. Maintain your current diet and exercise regimen to support your weight loss. Monitor your symptoms of fatigue and grogginess, and let us know if they persist or worsen. Additionally, please get the RSV vaccine as discussed.

## 2023-02-02 ENCOUNTER — Other Ambulatory Visit: Payer: Self-pay

## 2023-02-03 ENCOUNTER — Telehealth: Payer: Self-pay

## 2023-02-03 ENCOUNTER — Telehealth: Payer: Self-pay | Admitting: Pharmacy Technician

## 2023-02-03 ENCOUNTER — Other Ambulatory Visit (HOSPITAL_COMMUNITY): Payer: Self-pay

## 2023-02-03 MED ORDER — REPATHA SURECLICK 140 MG/ML ~~LOC~~ SOAJ: 1.0000 mL | SUBCUTANEOUS | 3 refills | Status: AC

## 2023-02-03 NOTE — Telephone Encounter (Signed)
Spoke with pt and explained that d/t insurance coverage, we switched her from Praluent to Repatha. Explained to pt the following information and that the rx has been sent to her [facility] pharmacy:   Pharmacy Patient Advocate Encounter   Received notification from Physician's Office that prior authorization for repatha is required/requested.   Insurance verification completed.   The patient is insured through  [employer]  .   Per test claim: The current 02/03/23 day co-pay is, $0.00.  No PA needed at this time. This test claim was processed through [facility] copay amounts may vary at other pharmacies due to pharmacy/plan contracts, or as the patient moves through the different stages of their insurance plan.       Pt told to let us know if she had a copay for Repatha at [facility] and we could resent the rx to a [facility] pharmacy for the $0 copay. Pt verbalized understanding and had no further questions.

## 2023-02-03 NOTE — Telephone Encounter (Signed)
Pharmacy Patient Advocate Encounter   Received notification from Physician's Office that prior authorization for repatha is required/requested.   Insurance verification completed.   The patient is insured through  [employer]  .   Per test claim: The current 02/03/23 day co-pay is, $0.00.  No PA needed at this time. This test claim was processed through [facility]- copay amounts may vary at other pharmacies due to pharmacy/plan contracts, or as the patient moves through the different stages of their insurance plan.

## 2023-02-03 NOTE — Addendum Note (Signed)
Addended by: Malena Peer D on: 02/03/2023 03:21 PM   Modules accepted: Orders

## 2023-02-03 NOTE — Telephone Encounter (Signed)
Rx for Repatha sent in

## 2023-02-19 ENCOUNTER — Other Ambulatory Visit: Payer: Self-pay | Admitting: Cardiology

## 2023-02-19 DIAGNOSIS — I10 Essential (primary) hypertension: Secondary | ICD-10-CM

## 2023-03-20 ENCOUNTER — Ambulatory Visit: Payer: 59 | Attending: Cardiology | Admitting: Cardiology

## 2023-03-20 ENCOUNTER — Ambulatory Visit: Payer: Self-pay | Admitting: Cardiology

## 2023-03-20 ENCOUNTER — Encounter: Payer: Self-pay | Admitting: Cardiology

## 2023-03-20 VITALS — BP 120/68 | HR 68 | Resp 16 | Ht 63.0 in | Wt 217.0 lb

## 2023-03-20 DIAGNOSIS — Z95820 Peripheral vascular angioplasty status with implants and grafts: Secondary | ICD-10-CM | POA: Diagnosis not present

## 2023-03-20 DIAGNOSIS — I2510 Atherosclerotic heart disease of native coronary artery without angina pectoris: Secondary | ICD-10-CM

## 2023-03-20 DIAGNOSIS — Z6838 Body mass index (BMI) 38.0-38.9, adult: Secondary | ICD-10-CM

## 2023-03-20 DIAGNOSIS — E66812 Obesity, class 2: Secondary | ICD-10-CM

## 2023-03-20 DIAGNOSIS — E782 Mixed hyperlipidemia: Secondary | ICD-10-CM

## 2023-03-20 DIAGNOSIS — E781 Pure hyperglyceridemia: Secondary | ICD-10-CM

## 2023-03-20 DIAGNOSIS — E119 Type 2 diabetes mellitus without complications: Secondary | ICD-10-CM

## 2023-03-20 DIAGNOSIS — I10 Essential (primary) hypertension: Secondary | ICD-10-CM

## 2023-03-20 NOTE — Patient Instructions (Signed)
Medication Instructions:  Your physician recommends that you continue on your current medications as directed. Please refer to the Current Medication list given to you today.  *If you need a refill on your cardiac medications before your next appointment, please call your pharmacy*  Lab Work: None ordered today.  Testing/Procedures: None ordered today.  Follow-Up: At [facility], you and your health needs are our priority.  As part of our continuing mission to provide you with exceptional heart care, we have created designated Provider Care Teams.  These Care Teams include your primary Cardiologist (physician) and Advanced Practice Providers (APPs -  Physician Assistants and Nurse Practitioners) who all work together to provide you with the care you need, when you need it.  Your next appointment:   1 year(s)  The format for your next appointment:   In Person  Provider:   Tessa Lerner, DO

## 2023-03-20 NOTE — Progress Notes (Signed)
Cardiology Office Note:  .   Date:  03/20/2023  ID:  [name] [name], DOB 06-May-1950, MRN [id number] PCP:  Elder Negus, NP  Former Cardiology Providers: Altamese Robbins, APRN, FNP-C  [facility] Providers Cardiologist:  Tessa Lerner, DO , [facility] (established care 11/03/2019) Electrophysiologist:  None  Click to update primary MD,subspecialty MD or APP then REFRESH:1}    Chief Complaint  Patient presents with   Coronary artery disease involving native coronary artery of   Follow-up    History of Present Illness: Marland Kitchen   [name] [name] is a 73 y.o. Caucasian female whose past medical history and cardiovascular risk factors includes: Diabetes mellitus type 2, morbid obesity, obstructive sleep apnea on CPAP, mixed hyperlipidemia, hypertriglyceridemia, chronic palpitations with history of PACs and PVCs, CAD status post PCI to the LAD 01/18/2019, postmenopausal female, advanced age.   Patient is being followed by the practice given her history of CAD with prior coronary interventions to the LAD.  Since then we have been working on improving her modifiable cardiovascular risk factors.  She presents today for 28-month follow-up visit.  Patient is accompanied by her son TJ at today's office visit.  Over the last 6 months she denies any anginal chest pain or heart failure symptoms.  She continues to be on Mounjaro and implementing lifestyle changes and has lost an additional 17 pounds.  She is congratulated on her efforts.Outside labs independently reviewed and noted below for further reference.  Patient states that her blood pressure medications had to be down titrated due to feeling lightheaded and dizziness.  This is likely secondary to weight loss.   Review of Systems: .   Review of Systems  Constitutional: Positive for weight loss.  Cardiovascular:  Negative for chest pain, claudication, irregular heartbeat, leg swelling, near-syncope, orthopnea, palpitations, paroxysmal nocturnal  dyspnea and syncope.  Respiratory:  Negative for shortness of breath.   Hematologic/Lymphatic: Negative for bleeding problem.    Studies Reviewed:   EKG: EKG Interpretation Date/Time:  Friday March 20 2023 08:41:50 EST Ventricular Rate:  66 PR Interval:  206 QRS Duration:  92 QT Interval:  410 QTC Calculation: 429 R Axis:   -2  Text Interpretation: Normal sinus rhythm Consider Inferior infarct (cited on or before 18-Jan-2019) Cannot rule out Anterior infarct , age undetermined When compared with ECG of 18-Jan-2019 10:10, No significant change since last tracing Confirmed by Tessa Lerner [phone]) on 03/20/2023 8:49:14 AM  Echocardiogram: 06/13/2020:    Poor echo window. Normal LV systolic function with EF 70%. Left ventricle cavity is normal in size. Mild concentric remodeling of the left  ventricle. Normal global wall motion. Doppler evidence of grade I (impaired) diastolic dysfunction, normal LAP. Calculated EF 70%.  Left atrial cavity is mildly dilated at 3.9 cm.  Right ventricle cavity is slightly dilated. Normal right ventricular function.  No significant change from 11/16/2018.   Heart Catheterization: 01/18/2019: Normal LV systolic function, normal LVEDP.  No pressure gradient across the aortic valve. RCA is dominant and has mild disease.  Circumflex coronary artery is moderate-sized vessel with mild disease. LAD is a large-caliber vessel, with high-grade 95% proximal stenosis followed by tandem 70 to 80% stenosis.  Moderate amount of calcification evident. Successful orbital atherectomy followed by stenting with 3.5 x 30 mm resolute Onyx at 12 atmospheric pressure for 60 seconds followed by postdilatation with 3.5 x 18 mm Hillman balloon at 16 atmospheric pressure x2.  Stenosis reduced to 0% with TIMI-3 to TIMI-3 flow.  RADIOLOGY: NA  Risk Assessment/Calculations:   N/A   Labs:       Latest Ref Rng & Units 01/14/2019    1:31 PM 07/09/2018    4:57 AM 07/07/2018    2:30 AM   CBC  WBC 3.4 - 10.8 x10E3/uL 7.9  15.7  17.1   Hemoglobin 11.1 - 15.9 g/dL 16.1  09.6  04.5   Hematocrit 34.0 - 46.6 % 39.6  41.4  38.7   Platelets 150 - 450 x10E3/uL 282  334  318        Latest Ref Rng & Units 10/02/2021   10:06 AM 09/11/2021    9:20 AM 03/21/2021    8:22 AM  BMP  Glucose 70 - 99 mg/dL 409  811  914   BUN 8 - 27 mg/dL 27  24  27    Creatinine 0.57 - 1.00 mg/dL 7.82  9.56  2.13   BUN/Creat Ratio 12 - 28 21  21  25    Sodium 134 - 144 mmol/L 140  141  144   Potassium 3.5 - 5.2 mmol/L 5.0  4.7  4.9   Chloride 96 - 106 mmol/L 98  99  95   CO2 20 - 29 mmol/L 25  26  26    Calcium 8.7 - 10.3 mg/dL 08.6  57.8  9.9       Latest Ref Rng & Units 10/02/2021   10:06 AM 09/11/2021    9:20 AM 03/21/2021    8:22 AM  CMP  Glucose 70 - 99 mg/dL 469  629  528   BUN 8 - 27 mg/dL 27  24  27    Creatinine 0.57 - 1.00 mg/dL 4.13  2.44  0.10   Sodium 134 - 144 mmol/L 140  141  144   Potassium 3.5 - 5.2 mmol/L 5.0  4.7  4.9   Chloride 96 - 106 mmol/L 98  99  95   CO2 20 - 29 mmol/L 25  26  26    Calcium 8.7 - 10.3 mg/dL 27.2  53.6  9.9   Total Protein 6.0 - 8.5 g/dL  6.6  6.6   Total Bilirubin 0.0 - 1.2 mg/dL  0.3  0.4   Alkaline Phos 44 - 121 IU/L  44  62   AST 0 - 40 IU/L  14  18   ALT 0 - 32 IU/L  24  23     Lab Results  Component Value Date   CHOL 84 (L) 09/11/2021   HDL 39 (L) 09/11/2021   LDLCALC 13 09/11/2021   LDLDIRECT 15 09/11/2021   TRIG 204 (H) 09/11/2021   CHOLHDL 2.2 09/11/2021   No results for input(s): "LIPOA" in the last 8760 hours. No components found for: "NTPROBNP" No results for input(s): "PROBNP" in the last 8760 hours. No results for input(s): "TSH" in the last 8760 hours.  External Labs: Collected: 02/06/2022 available in Care Everywhere at [facility]. Total cholesterol 92, triglycerides 274, HDL 32, LDL 29. Hemoglobin A1c 7.9 BUN 28, creatinine 1.22. eGFR 48. Sodium 138, potassium 4.8, chloride 98, bicarb 24. AST 16, ALT 15, alkaline  phosphatase 38   External Labs: Collected: August 18, 2022 available in Care Everywhere. Total cholesterol 95, triglycerides 188, HDL 37, LDL 28 A1c 6.7 Hemoglobin 64.4.  Hematocrit 34.9%. BUN 28, creatinine 1.25. Sodium 140, potassium 4.5, chloride 102, bicarb 24. AST 19, ALT 24, alkaline phosphatase 32  External Labs: Collected: March 04, 2023 available in [facility]. Total cholesterol 90, triglycerides 208, HDL  35, LDL 22 A1c 6.7 Magnesium 1.4 BUN 32, creatinine 1.24. eGFR 47. Sodium 138, potassium 4.7, chloride 99, bicarb 24  Physical Exam:    Today's Vitals   03/20/23 0838  BP: 120/68  Pulse: 68  Resp: 16  SpO2: 97%  Weight: 217 lb (98.4 kg)  Height: 5\' 3"  (1.6 m)   Body mass index is 38.44 kg/m. Wt Readings from Last 3 Encounters:  03/20/23 217 lb (98.4 kg)  01/14/23 219 lb (99.3 kg)  09/17/22 234 lb (106.1 kg)    Physical Exam  Constitutional: No distress.  Age appropriate, hemodynamically stable.   Neck: No JVD present.  Cardiovascular: Normal rate, regular rhythm, S1 normal, S2 normal, intact distal pulses and normal pulses. Exam reveals no gallop, no S3, no S4 and no friction rub.  No murmur heard. Pulmonary/Chest: Effort normal and breath sounds normal. No stridor. She has no wheezes. She has no rales. She exhibits no tenderness.  Abdominal: Soft. Bowel sounds are normal. She exhibits no distension. There is no abdominal tenderness.  Musculoskeletal:        General: No tenderness or edema.     Cervical back: Neck supple.  Neurological: She is alert and oriented to person, place, and time. She has intact cranial nerves (2-12).  Skin: Skin is warm and moist.     Impression & Recommendation(s):  Impression:   ICD-10-CM   1. Coronary artery disease involving native coronary artery of native heart without angina pectoris  I25.10 EKG 12-Lead    2. Status post angioplasty with stent  Z95.820     3. Hypertriglyceridemia  E78.1     4.  Type 2 diabetes mellitus without complication, without long-term current use of insulin (HCC)  E11.9     5. Mixed hyperlipidemia  E78.2     6. Essential hypertension  I10     7. Class 2 severe obesity due to excess calories with serious comorbidity and body mass index (BMI) of 38.0 to 38.9 in adult [facility])  B14.782    E66.01    Z68.38        Recommendation(s):  Coronary artery disease involving native coronary artery of native heart without angina pectoris Status post angioplasty with stent Denies anginal chest pain. EKG today illustrates sinus rhythm without underlying ischemia or injury pattern. Since starting [facility] and implementing lifestyle changes patient has lost approximately 25-30 pounds.  She is congratulated on her efforts. Reemphasized the importance of improving her modifiable cardiovascular risk factors. Despite the weight loss and being on Repatha as well as Vascepa triglycerides are still not at goal.  Reemphasized importance of reducing foods that are high in triglycerides.  Hypertriglyceridemia Triglycerides still remain at 288 mg/dL. Despite being on [place], Repatha, and Vascepa. Monitor diet particularly carbohydrates and alcohol intake. Ensure at least 12 hours of fasting before the next blood work. Continue compliance with medications.  Type 2 diabetes mellitus without complication, without long-term current use of insulin (HCC) Hemoglobin A1c 6.7. Sugars have improved significantly with current medical regimen and weight loss. Reemphasized importance of glycemic control. Monitor for now  Mixed hyperlipidemia Currently on atorvastatin 80 mg p.o. daily, Repatha   She denies myalgia or other side effects. Most recent lipids dated March 04, 2023, independently reviewed as noted above.  LDL is 22 mg/dL.   Essential hypertension Office blood pressures are very well-controlled. Continue carvedilol 12.5 mg p.o. twice daily. Continue losartan 50 mg p.o.  every afternoon Reemphasized importance of low-salt diet.  Class 2 severe obesity  due to excess calories with serious comorbidity and body mass index (BMI) of 38.0 to 38.9 in adult [facility]) Body mass index is 38.44 kg/m. I reviewed with her importance of diet, regular physical activity/exercise, weight loss.   Patient is educated on the importance of increasing physical activity gradually as tolerated with a goal of moderate intensity exercise for 30 minutes a day 5 days a week.  Orders Placed:  Orders Placed This Encounter  Procedures   EKG 12-Lead   I discussed management of at least 2 chronic comorbid conditions, ordered and independently reviewed EKG, outside labs from Care Everywhere dating 03/04/2023 independently reviewed, prior ischemic workup results including echo and stress test reviewed as part of medical decision making today.  Goals and coordination of care discussed with the patient and son during today's visit.  Their questions and concerns addressed to their satisfaction  Final Medication List:   No orders of the defined types were placed in this encounter.   There are no discontinued medications.   Current Outpatient Medications:    ACCU-CHEK AVIVA PLUS test strip, U UTD, Disp: , Rfl: 5   ACCU-CHEK SOFTCLIX LANCETS lancets, U UTD, Disp: , Rfl: 11   acetaminophen (TYLENOL) 500 MG tablet, Take 1,000-1,500 mg by mouth 2 (two) times daily as needed (pain)., Disp: , Rfl:    atorvastatin (LIPITOR) 80 MG tablet, Take 80 mg by mouth daily., Disp: , Rfl:    carvedilol (COREG) 12.5 MG tablet, TAKE 1 TABLET(12.5 MG) BY MOUTH TWICE DAILY, Disp: 180 tablet, Rfl: 1   cetirizine (ZYRTEC) 10 MG tablet, Take 10 mg by mouth at bedtime. , Disp: , Rfl:    Cholecalciferol (VITAMIN D) 50 MCG (2000 UT) tablet, Take 2,000 Units by mouth daily., Disp: , Rfl:    clopidogrel (PLAVIX) 75 MG tablet, Take 75 mg by mouth daily., Disp: , Rfl:    escitalopram (LEXAPRO) 20 MG tablet, Take 20 mg by mouth  every evening. , Disp: , Rfl:    Evolocumab (REPATHA SURECLICK) 140 MG/ML SOAJ, Inject 140 mg into the skin every 14 (fourteen) days., Disp: 6 mL, Rfl: 3   fluticasone (FLONASE) 50 MCG/ACT nasal spray, Place 1 spray into both nostrils as needed., Disp: , Rfl:    gabapentin (NEURONTIN) 300 MG capsule, Take 300 mg by mouth at bedtime., Disp: , Rfl:    losartan (COZAAR) 50 MG tablet, Take 1 tablet (50 mg total) by mouth daily at 10 pm., Disp: , Rfl:    meloxicam (MOBIC) 15 MG tablet, Take 15 mg by mouth daily., Disp: , Rfl:    Multiple Vitamin (MULTIVITAMIN WITH MINERALS) TABS tablet, Take 1 tablet by mouth daily., Disp: , Rfl:    nitroGLYCERIN (NITROSTAT) 0.4 MG SL tablet, PLACE 1 TABLET UNDER THE TONGUE EVERY 5 MINUTES AS NEEDED FOR CHEST PAIN, Disp: 25 tablet, Rfl: 3   pantoprazole (PROTONIX) 40 MG tablet, Take 1 tablet by mouth daily., Disp: , Rfl:    tirzepatide (MOUNJARO) 15 MG/0.5ML Pen, Inject 15 mg into the skin once a week., Disp: , Rfl:    VASCEPA 1 g capsule, TAKE 2 CAPSULES(2 GRAMS) BY MOUTH TWICE DAILY, Disp: 120 capsule, Rfl: 5  Consent:   NA  Disposition:   6 months follow-up sooner if needed Patient may be asked to follow-up sooner based on the results of the above-mentioned testing.  Her questions and concerns were addressed to her satisfaction. She voices understanding of the recommendations provided during this encounter.    Signed, Tessa Lerner, DO,  [facility]  [facility]  [address] [place], [place] [address] 03/20/2023 10:55 AM

## 2023-03-21 ENCOUNTER — Other Ambulatory Visit: Payer: Self-pay | Admitting: Cardiology

## 2023-03-21 DIAGNOSIS — I10 Essential (primary) hypertension: Secondary | ICD-10-CM

## 2023-04-28 ENCOUNTER — Telehealth: Payer: Self-pay | Admitting: Cardiology

## 2023-04-28 ENCOUNTER — Other Ambulatory Visit (HOSPITAL_COMMUNITY): Payer: Self-pay

## 2023-04-28 ENCOUNTER — Telehealth: Payer: Self-pay | Admitting: Pharmacy Technician

## 2023-04-28 NOTE — Telephone Encounter (Signed)
 Pharmacy Patient Advocate Encounter   Received notification from Pt Calls Messages that prior authorization for repatha is required/requested.   Insurance verification completed.   The patient is insured through [employer] .   Per test claim: PA required; PA submitted to above mentioned insurance via CoverMyMeds Key/confirmation #/EOC [id number] Status is pending

## 2023-04-28 NOTE — Telephone Encounter (Signed)
 Pharmacy Patient Advocate Encounter  Received notification from [facility] that Prior Authorization for repatha has been APPROVED from 04/28/23 to 10/29/23. Ran test claim, Copay is $0.00- 3 months. This test claim was processed through [facility]- copay amounts may vary at other pharmacies due to pharmacy/plan contracts, or as the patient moves through the different stages of their insurance plan.   PA #/Case ID/Reference #: [id number]

## 2023-04-28 NOTE — Telephone Encounter (Signed)
 Pt c/o medication issue:  1. Name of Medication:   Evolocumab (REPATHA SURECLICK) 140 MG/ML SOAJ   2. How are you currently taking this medication (dosage and times per day)?   3. Are you having a reaction (difficulty breathing--STAT)?   4. What is your medication issue?    Caller Archie Patten) is following up on patient's prior authorization for Evolocumab (REPATHA SURECLICK) 140 MG/ML SOAJ .

## 2023-08-18 ENCOUNTER — Other Ambulatory Visit: Payer: Self-pay | Admitting: Cardiology

## 2023-08-18 DIAGNOSIS — I10 Essential (primary) hypertension: Secondary | ICD-10-CM

## 2023-10-06 ENCOUNTER — Encounter (HOSPITAL_BASED_OUTPATIENT_CLINIC_OR_DEPARTMENT_OTHER): Payer: Self-pay | Admitting: Pulmonary Disease

## 2023-10-06 DIAGNOSIS — G4733 Obstructive sleep apnea (adult) (pediatric): Secondary | ICD-10-CM

## 2023-10-06 NOTE — Telephone Encounter (Signed)
 Is it okay to send in order for new cpap machine has hers as exceeded the 5 year mark alva is out of office untile 10/10/2023 says you are covering his box

## 2023-10-07 NOTE — Telephone Encounter (Signed)
 She was seen within the last year (November); however, they may require a new OV. We can go ahead and try to send the order.   Ok to place order for new CPAP machine 12-16 cmH2O, mask of choice, heated humidity. Will need OV within 31-90 days of receiving new CPAP machine.

## 2023-11-17 ENCOUNTER — Telehealth: Payer: Self-pay | Admitting: Pharmacy Technician

## 2023-11-17 NOTE — Telephone Encounter (Signed)
 Pharmacy Patient Advocate Encounter  Received notification from [employer] that Prior Authorization for repatha  has been APPROVED from 11/17/23 to 02/24/24   PA #/Case ID/Reference #: [id number]

## 2023-11-17 NOTE — Telephone Encounter (Signed)
   Pharmacy Patient Advocate Encounter   Received notification from Onbase that prior authorization for repatha  is required/requested.   Insurance verification completed.   The patient is insured through [facility] .   Per test claim: PA required; PA submitted to above mentioned insurance via Latent Key/confirmation #/EOC [id number] Status is pending

## 2023-12-18 ENCOUNTER — Ambulatory Visit (INDEPENDENT_AMBULATORY_CARE_PROVIDER_SITE_OTHER): Admitting: Pulmonary Disease

## 2023-12-18 ENCOUNTER — Encounter (HOSPITAL_BASED_OUTPATIENT_CLINIC_OR_DEPARTMENT_OTHER): Payer: Self-pay | Admitting: Pulmonary Disease

## 2023-12-18 VITALS — BP 112/67 | HR 70 | Ht 63.0 in | Wt 212.0 lb

## 2023-12-18 DIAGNOSIS — G4733 Obstructive sleep apnea (adult) (pediatric): Secondary | ICD-10-CM

## 2023-12-18 NOTE — Patient Instructions (Signed)
 X FLu shot CPAP is working well

## 2023-12-18 NOTE — Progress Notes (Signed)
 Subjective:    Patient ID: [name] [name], female    DOB: April 17, 1950, 73 y.o.   MRN: [id number]   73 yo diabetic, hypertensive never smoker followed for obstructive sleep apnea  -diagnosed in 2014.  - on auto CPAP 12 to 16 cm with a fullface mask      PMH - COVID-19 pneumonia with acute respiratory failure in May 2020 12/2018 cardiac stent -diabetes type 2  Discussed the use of AI scribe software for clinical note transcription with the patient, who gave verbal consent to proceed.  History of Present Illness [name] [name] is a 73 year old female with obstructive sleep apnea and diabetes who presents for follow-up on weight loss and diabetes management.  She has obstructive sleep apnea and uses a CPAP machine, which is quiet and effective. She typically sleeps eight hours a night with minimal apnea events on her current settings. She missed using the CPAP one night recently due to staying at the hospital with someone.  She has experienced significant weight loss while on Mounjaro, attributed to dietary changes including increased vegetable intake and reduced meat consumption. Her blood sugar levels have improved, with her HbA1c decreasing to 6.2-6.3. She takes 18 units of insulin  daily, one metformin  tablet daily (split into two doses), and Mounjaro once a week.   Hyperlipidemia is effectively managed with Repatha , significantly improving her cholesterol levels.   Significant tests/ events reviewed   NPSG 02/2012:  AHI 37/hr, desat to 78% Auto 2014:  Optimal pressure 16cm. 12/2012:  Decrease cpap to 14cm (10 pound weight loss).   05/2016 >>PAP titration  16/12 cm H2O     Review of Systems  neg for any significant sore throat, dysphagia, itching, sneezing, nasal congestion or excess/ purulent secretions, fever, chills, sweats, unintended wt loss, pleuritic or exertional cp, hempoptysis, orthopnea pnd or change in chronic leg swelling. Also denies presyncope, palpitations,  heartburn, abdominal pain, nausea, vomiting, diarrhea or change in bowel or urinary habits, dysuria,hematuria, rash, arthralgias, visual complaints, headache, numbness weakness or ataxia.      Objective:   Physical Exam  Gen. Pleasant, obese, in no distress ENT - no lesions, no post nasal drip Neck: No JVD, no thyromegaly, no carotid bruits Lungs: no use of accessory muscles, no dullness to percussion, decreased without rales or rhonchi  Cardiovascular: Rhythm regular, heart sounds  normal, no murmurs or gallops, no peripheral edema Musculoskeletal: No deformities, no cyanosis or clubbing , no tremors       Assessment & Plan:   Assessment and Plan Assessment & Plan Obstructive sleep apnea Obstructive sleep apnea is well-managed with CPAP therapy. She reports excellent adherence, achieving 8 hours of sleep per night. CPAP settings are optimal with pressures between 12 to 16, and she requires about 13 to 14. Apnea events are minimal at 0.4, indicating effective treatment. She reports improved sleep quality and satisfaction with the new CPAP machine, which is quieter and more comfortable.  Type 2 diabetes mellitus Type 2 diabetes mellitus is well-controlled. She is on Mounjaro, significantly improving her glycemic control, with recent HbA1c levels around 6.2 to 6.3. She anticipates further improvement. She is also on 18 units of insulin  daily and takes metformin  in two doses. She is satisfied with her regimen and reports positive outcomes.   Obesity Obesity is being addressed with Mounjaro, contributing to significant weight loss. She reports dietary changes, including increased vegetable intake and reduced meat consumption, supporting her weight loss efforts. She feels better and is motivated  by the positive changes in her health status.  Follow-Up Annual follow-up is deemed sufficient given the current stability and control of her conditions. She is compliant with her treatment regimen  and reports positive health outcomes. - Administer flu shot today.

## 2024-02-03 ENCOUNTER — Encounter: Payer: Self-pay | Admitting: Cardiology

## 2024-02-26 ENCOUNTER — Other Ambulatory Visit: Payer: Self-pay | Admitting: Internal Medicine

## 2024-03-08 ENCOUNTER — Other Ambulatory Visit (HOSPITAL_COMMUNITY): Payer: Self-pay

## 2024-03-08 ENCOUNTER — Telehealth: Payer: Self-pay | Admitting: Pharmacy Technician

## 2024-03-08 NOTE — Telephone Encounter (Signed)
 Pharmacy Patient Advocate Encounter   Received notification from Pt Calls Messages that prior authorization for REPATHA  is required/requested.   Insurance verification completed.   The patient is insured through [facility].   Per test claim: PA required; PA submitted to above mentioned insurance via Latent Key/confirmation #/EOC [id number] Status is pending

## 2024-03-09 NOTE — Telephone Encounter (Signed)
 Pharmacy Patient Advocate Encounter  Received notification from [facility] that Prior Authorization for repatha  has been APPROVED from 03/08/24 to 03/08/25   PA #/Case ID/Reference #: [id number]
